# Patient Record
Sex: Female | Born: 1935
Health system: Southern US, Community
[De-identification: ages and names within clinical notes are randomized; demographics above are authoritative.]

## PROBLEM LIST (undated history)

## (undated) DIAGNOSIS — G43909 Migraine, unspecified, not intractable, without status migrainosus: Secondary | ICD-10-CM

## (undated) DIAGNOSIS — Z9289 Personal history of other medical treatment: Secondary | ICD-10-CM

## (undated) DIAGNOSIS — R51 Headache: Secondary | ICD-10-CM

## (undated) DIAGNOSIS — Z5189 Encounter for other specified aftercare: Secondary | ICD-10-CM

## (undated) DIAGNOSIS — D649 Anemia, unspecified: Secondary | ICD-10-CM

## (undated) DIAGNOSIS — K52831 Collagenous colitis: Secondary | ICD-10-CM

## (undated) DIAGNOSIS — I6529 Occlusion and stenosis of unspecified carotid artery: Secondary | ICD-10-CM

## (undated) DIAGNOSIS — Z9889 Other specified postprocedural states: Secondary | ICD-10-CM

## (undated) DIAGNOSIS — R519 Headache, unspecified: Secondary | ICD-10-CM

## (undated) DIAGNOSIS — G8929 Other chronic pain: Secondary | ICD-10-CM

## (undated) DIAGNOSIS — K648 Other hemorrhoids: Secondary | ICD-10-CM

## (undated) DIAGNOSIS — I10 Essential (primary) hypertension: Secondary | ICD-10-CM

## (undated) DIAGNOSIS — Z86018 Personal history of other benign neoplasm: Secondary | ICD-10-CM

## (undated) DIAGNOSIS — N390 Urinary tract infection, site not specified: Secondary | ICD-10-CM

## (undated) DIAGNOSIS — Z8739 Personal history of other diseases of the musculoskeletal system and connective tissue: Secondary | ICD-10-CM

## (undated) DIAGNOSIS — E039 Hypothyroidism, unspecified: Secondary | ICD-10-CM

## (undated) DIAGNOSIS — K449 Diaphragmatic hernia without obstruction or gangrene: Secondary | ICD-10-CM

## (undated) DIAGNOSIS — E785 Hyperlipidemia, unspecified: Secondary | ICD-10-CM

## (undated) DIAGNOSIS — K5790 Diverticulosis of intestine, part unspecified, without perforation or abscess without bleeding: Secondary | ICD-10-CM

## (undated) DIAGNOSIS — M1990 Unspecified osteoarthritis, unspecified site: Secondary | ICD-10-CM

## (undated) HISTORY — DX: Hyperlipidemia, unspecified: E78.5

## (undated) HISTORY — DX: Encounter for other specified aftercare: Z51.89

## (undated) HISTORY — DX: Hypothyroidism, unspecified: E03.9

## (undated) HISTORY — DX: Other specified postprocedural states: Z98.890

## (undated) HISTORY — DX: Personal history of other medical treatment: Z92.89

## (undated) HISTORY — DX: Other hemorrhoids: K64.8

## (undated) HISTORY — DX: Headache, unspecified: R51.9

## (undated) HISTORY — DX: Migraine, unspecified, not intractable, without status migrainosus: G43.909

## (undated) HISTORY — PX: FUNCTIONAL ENDOSCOPIC SINUS SURGERY: SUR616

## (undated) HISTORY — PX: POLYPECTOMY: SHX149

## (undated) HISTORY — PX: TONSILLECTOMY: SUR1361

## (undated) HISTORY — DX: Other chronic pain: G89.29

## (undated) HISTORY — DX: Diverticulosis of intestine, part unspecified, without perforation or abscess without bleeding: K57.90

## (undated) HISTORY — DX: Headache: R51

## (undated) HISTORY — PX: BREAST BIOPSY: SHX20

## (undated) HISTORY — DX: Unspecified osteoarthritis, unspecified site: M19.90

## (undated) HISTORY — DX: Occlusion and stenosis of unspecified carotid artery: I65.29

## (undated) HISTORY — PX: BUNIONECTOMY: SHX129

## (undated) HISTORY — DX: Essential (primary) hypertension: I10

## (undated) HISTORY — DX: Diaphragmatic hernia without obstruction or gangrene: K44.9

## (undated) HISTORY — DX: Urinary tract infection, site not specified: N39.0

## (undated) HISTORY — DX: Collagenous colitis: K52.831

## (undated) HISTORY — PX: HAMMER TOE SURGERY: SHX385

---

## 1996-05-04 HISTORY — PX: HAND SURGERY: SHX662

## 2006-05-04 HISTORY — PX: TOTAL HIP ARTHROPLASTY: SHX124

## 2007-05-05 DIAGNOSIS — K648 Other hemorrhoids: Secondary | ICD-10-CM

## 2007-05-05 HISTORY — DX: Other hemorrhoids: K64.8

## 2008-03-04 HISTORY — PX: LAMINECTOMY: SHX219

## 2009-10-25 ENCOUNTER — Encounter (HOSPITAL_COMMUNITY): Admission: RE | Admit: 2009-10-25 | Discharge: 2009-11-24 | Payer: Self-pay | Admitting: Orthopedic Surgery

## 2010-03-04 HISTORY — PX: CATARACT EXTRACTION W/ INTRAOCULAR LENS IMPLANT: SHX1309

## 2010-07-09 ENCOUNTER — Other Ambulatory Visit: Payer: Self-pay | Admitting: Obstetrics and Gynecology

## 2010-07-09 DIAGNOSIS — Z1231 Encounter for screening mammogram for malignant neoplasm of breast: Secondary | ICD-10-CM

## 2010-07-31 ENCOUNTER — Ambulatory Visit
Admission: RE | Admit: 2010-07-31 | Discharge: 2010-07-31 | Disposition: A | Discharge status: Home / Self Care | Payer: BC Managed Care – PPO | Source: Ambulatory Visit | Attending: Obstetrics and Gynecology | Admitting: Obstetrics and Gynecology

## 2010-07-31 DIAGNOSIS — Z1231 Encounter for screening mammogram for malignant neoplasm of breast: Secondary | ICD-10-CM

## 2010-11-02 HISTORY — PX: CATARACT EXTRACTION W/ INTRAOCULAR LENS IMPLANT: SHX1309

## 2011-04-13 ENCOUNTER — Encounter: Payer: Self-pay | Admitting: Gastroenterology

## 2011-05-05 DIAGNOSIS — K5790 Diverticulosis of intestine, part unspecified, without perforation or abscess without bleeding: Secondary | ICD-10-CM

## 2011-05-05 HISTORY — PX: COLONOSCOPY: SHX174

## 2011-05-05 HISTORY — DX: Diverticulosis of intestine, part unspecified, without perforation or abscess without bleeding: K57.90

## 2011-05-08 ENCOUNTER — Ambulatory Visit (INDEPENDENT_AMBULATORY_CARE_PROVIDER_SITE_OTHER): Payer: Medicare Other | Admitting: Gastroenterology

## 2011-05-08 ENCOUNTER — Other Ambulatory Visit (INDEPENDENT_AMBULATORY_CARE_PROVIDER_SITE_OTHER): Payer: Medicare Other

## 2011-05-08 ENCOUNTER — Encounter: Payer: Self-pay | Admitting: Gastroenterology

## 2011-05-08 VITALS — BP 148/78 | HR 76 | Ht 63.0 in | Wt 129.0 lb

## 2011-05-08 DIAGNOSIS — Z860100 Personal history of colon polyps, unspecified: Secondary | ICD-10-CM | POA: Insufficient documentation

## 2011-05-08 DIAGNOSIS — R6889 Other general symptoms and signs: Secondary | ICD-10-CM

## 2011-05-08 DIAGNOSIS — D509 Iron deficiency anemia, unspecified: Secondary | ICD-10-CM | POA: Insufficient documentation

## 2011-05-08 DIAGNOSIS — Z800 Family history of malignant neoplasm of digestive organs: Secondary | ICD-10-CM

## 2011-05-08 DIAGNOSIS — Z8711 Personal history of peptic ulcer disease: Secondary | ICD-10-CM

## 2011-05-08 DIAGNOSIS — K5730 Diverticulosis of large intestine without perforation or abscess without bleeding: Secondary | ICD-10-CM | POA: Insufficient documentation

## 2011-05-08 DIAGNOSIS — R197 Diarrhea, unspecified: Secondary | ICD-10-CM | POA: Diagnosis not present

## 2011-05-08 LAB — VITAMIN B12: Vitamin B-12: 751 pg/mL (ref 211–911)

## 2011-05-08 LAB — HEPATIC FUNCTION PANEL
ALT: 17 U/L (ref 0–35)
Albumin: 4.1 g/dL (ref 3.5–5.2)
Alkaline Phosphatase: 45 U/L (ref 39–117)
Total Protein: 7.8 g/dL (ref 6.0–8.3)

## 2011-05-08 LAB — BASIC METABOLIC PANEL
BUN: 19 mg/dL (ref 6–23)
CO2: 26 mEq/L (ref 19–32)
Calcium: 9.1 mg/dL (ref 8.4–10.5)
GFR: 63.59 mL/min (ref >60.00)
Glucose, Bld: 93 mg/dL (ref 70–99)
Potassium: 4.6 mEq/L (ref 3.5–5.1)

## 2011-05-08 LAB — CBC WITH DIFFERENTIAL/PLATELET
Basophils Absolute: 0 10*3/uL (ref 0.0–0.1)
Eosinophils Absolute: 0.2 10*3/uL (ref 0.0–0.7)
Hemoglobin: 14.2 g/dL (ref 12.0–15.0)
Lymphocytes Relative: 21.2 % (ref 12.0–46.0)
Monocytes Relative: 4.2 % (ref 3.0–12.0)
Neutro Abs: 5.1 10*3/uL (ref 1.4–7.7)
Neutrophils Relative %: 71.2 % (ref 43.0–77.0)
Platelets: 328 10*3/uL (ref 150.0–400.0)
RDW: 14 % (ref 11.5–14.6)

## 2011-05-08 LAB — FERRITIN: Ferritin: 20.5 ng/mL (ref 10.0–291.0)

## 2011-05-08 LAB — FOLATE: Folate: >24.8 ng/mL (ref >5.9)

## 2011-05-08 LAB — IBC PANEL: Saturation Ratios: 18.7 % — ABNORMAL LOW (ref 20.0–50.0)

## 2011-05-08 MED ORDER — PEG-KCL-NACL-NASULF-NA ASC-C 100 G PO SOLR: 1.0000 | Freq: Once | ORAL | Status: DC

## 2011-05-08 NOTE — Progress Notes (Signed)
History of Present Illness:  This is a very pleasant 78 year old Caucasian female recently moved to this area from [place]. She has a large amount of records which I reviewed today. She has had chronic diarrhea for 10 years with urgency and increased gas and bloating, but no real pain, rectal bleeding, anorexia or weight loss. She has had several colonoscopies and has diverticulosis, a history of internal hemorrhoids, and polyps removed several years ago. Most recent colonoscopy in 2009 and showed no colon polyps. It is of note, she had a brother die of colon cancer at age 23.  She denies any specific food intolerances except to beans and roughage. She denies any upper gastrointestinal or hepatobiliary or systemic complaints such as fever or chills. She has not had previous abdominal surgery. In the past the patient did have peptic ulcer disease, was treated for H. pylori, and subsequently had an NSAID-induced O.'s duration with her last endoscopy in 2004. Had a previous hip replacement but denies NSAID use at this time. She does not smoke or abuse ethanol. There is no history of pancreatitis, hepatitis, or other liver disease.  I have reviewed this patient's present history, medical and surgical past history, allergies and medications.     ROS: The remainder of the 10 point ROS is negative.. she describes nonspecific chronic low back pain. She has had previous laminectomy. No current cardiovascular or pulmonary complaints or upper GI issues.   Physical Exam: General well developed well nourished patient in no acute distress, appearing her stated age Eyes PERRLA, no icterus, fundoscopic exam per opthamologist Skin no lesions noted Neck supple, no adenopathy, no thyroid enlargement, no tenderness Chest clear to percussion and auscultation Heart no significant murmurs, gallops or rubs noted Abdomen no hepatosplenomegaly masses or tenderness, BS normal.  Extremities no acute joint lesions, edema,  phlebitis or evidence of cellulitis. Neurologic patient oriented x 3, cranial nerves intact, no focal neurologic deficits noted. Psychological mental status normal and normal affect.  Assessment and plan: Her symptomatology seems him as classic for microscopic-lymphocytic-collagenous colitis. I reviewed her numerous breath test that have not shown any direct evidence of bacterial overgrowth. Previous duodenal biopsy showed no evidence of celiac disease. We will repeat her colonoscopy with random biopsies, and screening lab test ordered today for review. If her colonoscopy and colon biopsies are negative, I will consider treatment with Xifaxan and Florstar. She otherwise is to continue her other medications as listed and reviewed. Although there is a family history of colon cancer in a young first degree relative, it does not sound like she has a hereditary polyposis syndrome. Encounter Diagnoses  Name Primary?  . Diarrhea Yes  . Iron deficiency anemia, unspecified    . Other general symptoms    . Family history of malignant neoplasm of gastrointestinal tract   . Personal history of colonic polyps   . History of peptic ulcer disease   . Diverticulosis of colon (without mention of hemorrhage)

## 2011-05-08 NOTE — Patient Instructions (Signed)
Your procedure has been scheduled for 06/09/2011, please follow the seperate instructions.  Handout on propofol given. Please go to the basement today for your labs.  Your prescription(s) have been sent to you pharmacy.

## 2011-05-11 ENCOUNTER — Other Ambulatory Visit: Payer: Medicare Other

## 2011-05-11 DIAGNOSIS — R197 Diarrhea, unspecified: Secondary | ICD-10-CM

## 2011-05-11 LAB — CELIAC PANEL 10
Endomysial Screen: NEGATIVE
Gliadin IgA: 1.8 U/mL (ref <20)
Gliadin IgG: 11.6 U/mL (ref <20)
Tissue Transglut Ab: 8.3 U/mL (ref <20)
Tissue Transglutaminase Ab, IgA: 2.5 U/mL (ref <20)

## 2011-05-21 DIAGNOSIS — Z961 Presence of intraocular lens: Secondary | ICD-10-CM | POA: Diagnosis not present

## 2011-05-21 DIAGNOSIS — H401490 Capsular glaucoma with pseudoexfoliation of lens, unspecified eye, stage unspecified: Secondary | ICD-10-CM | POA: Diagnosis not present

## 2011-06-09 ENCOUNTER — Ambulatory Visit (AMBULATORY_SURGERY_CENTER): Payer: Medicare Other | Admitting: Gastroenterology

## 2011-06-09 ENCOUNTER — Encounter: Payer: Self-pay | Admitting: Gastroenterology

## 2011-06-09 VITALS — BP 164/95 | HR 83 | Temp 96.5°F | Resp 22 | Ht 63.0 in | Wt 129.0 lb

## 2011-06-09 DIAGNOSIS — K5289 Other specified noninfective gastroenteritis and colitis: Secondary | ICD-10-CM | POA: Diagnosis not present

## 2011-06-09 DIAGNOSIS — Z800 Family history of malignant neoplasm of digestive organs: Secondary | ICD-10-CM

## 2011-06-09 DIAGNOSIS — I10 Essential (primary) hypertension: Secondary | ICD-10-CM | POA: Diagnosis not present

## 2011-06-09 DIAGNOSIS — R197 Diarrhea, unspecified: Secondary | ICD-10-CM

## 2011-06-09 DIAGNOSIS — D649 Anemia, unspecified: Secondary | ICD-10-CM | POA: Diagnosis not present

## 2011-06-09 DIAGNOSIS — K529 Noninfective gastroenteritis and colitis, unspecified: Secondary | ICD-10-CM

## 2011-06-09 DIAGNOSIS — E785 Hyperlipidemia, unspecified: Secondary | ICD-10-CM | POA: Diagnosis not present

## 2011-06-09 DIAGNOSIS — I739 Peripheral vascular disease, unspecified: Secondary | ICD-10-CM | POA: Diagnosis not present

## 2011-06-09 DIAGNOSIS — H409 Unspecified glaucoma: Secondary | ICD-10-CM | POA: Diagnosis not present

## 2011-06-09 DIAGNOSIS — K5730 Diverticulosis of large intestine without perforation or abscess without bleeding: Secondary | ICD-10-CM

## 2011-06-09 MED ORDER — SODIUM CHLORIDE 0.9 % IV SOLN: 500.0000 mL | INTRAVENOUS | Status: DC

## 2011-06-09 NOTE — Patient Instructions (Signed)
Please read the handouts given to you by your recovery room nurse.    Your biopsy results will be mailed to your home within two weeks.  You need to increase the fiber in your diet due to your diverticulosis.   You may resume your routine medications today.  If you have any questions, please call us at [phone].  Thank-you.

## 2011-06-09 NOTE — Progress Notes (Signed)
Patient did not have preoperative order for IV antibiotic SSI prophylaxis. (G8918)  Patient did not experience any of the following events: a burn prior to discharge; a fall within the facility; wrong site/side/patient/procedure/implant event; or a hospital transfer or hospital admission upon discharge from the facility. (G8907)  

## 2011-06-09 NOTE — Op Note (Signed)
[facility] [address] [place], [place]  [address]  COLONOSCOPY PROCEDURE REPORT  PATIENT:  Carol Wells, Carol Wells  MR#:  [id number] BIRTHDATE:  07-12-1931, 79 yrs. old  GENDER:  female ENDOSCOPIST:  Vania Rea. Jarold Motto, MD, [facility] REF. BY: PROCEDURE DATE:  06/09/2011 PROCEDURE:  Colonoscopy with biopsy ASA CLASS:  Class III INDICATIONS:  unexplained diarrhea, surveillance MEDICATIONS:   propofol (Diprivan) 200 mg IV  DESCRIPTION OF PROCEDURE:   After the risks and benefits and of the procedure were explained, informed consent was obtained. Digital rectal exam was performed and revealed no abnormalities. The LB [id number] endoscope was introduced through the anus and advanced to the cecum, which was identified by both the appendix and ileocecal valve.  The quality of the prep was good, using MoviPrep.  The instrument was then slowly withdrawn as the colon was fully examined. <<PROCEDUREIMAGES>>  FINDINGS:  There were mild diverticular changes in left colon. diverticulosis was found.  No polyps or cancers were seen.  This was otherwise a normal examination of the colon. RANDOM BIOPSIES DONE.   Retroflexed views in the rectum revealed no abnormalities. The scope was then withdrawn from the patient and the procedure completed.  COMPLICATIONS:  None ENDOSCOPIC IMPRESSION: 1) Diverticulosis,mild,left sided diverticulosis 2) No polyps or cancers 3) Otherwise normal examination R/O MICROSCOPIC/COLLAGENOUS COLITIS. RECOMMENDATIONS: 1) Await biopsy results 2) Continue current medications  REPEAT EXAM:  No  ______________________________ Vania Rea. Jarold Motto, MD, [facility]  CC:  Dwana Melena MD  n. Rosalie DoctorVania Rea. Marieta Markov at 06/09/2011 09:57 AM  [place], [id number]

## 2011-06-10 ENCOUNTER — Telehealth: Payer: Self-pay

## 2011-06-10 NOTE — Telephone Encounter (Signed)
  Follow up Call-  Call back number 06/09/2011  Post procedure Call Back phone  # [phone]  Permission to leave phone message Yes     Patient questions:  Do you have a fever, pain , or abdominal swelling? no Pain Score  0 *  Have you tolerated food without any problems? yes  Have you been able to return to your normal activities? yes  Do you have any questions about your discharge instructions: Diet   no Medications  no Follow up visit  no  Do you have questions or concerns about your Care? no  Actions: * If pain score is 4 or above: No action needed, pain <4.

## 2011-06-11 NOTE — Progress Notes (Signed)
Information that was entered and then removed from 06/09/2011 @ 0827 was removed by me because documentation was placed in error by Darlyn Read, RN and belonged to another patient.  Both charts reviewed and corrected.

## 2011-06-12 ENCOUNTER — Telehealth: Payer: Self-pay | Admitting: Gastroenterology

## 2011-06-12 NOTE — Telephone Encounter (Signed)
Pt requested a copy of her labs; mailed to her.

## 2011-06-17 ENCOUNTER — Encounter: Payer: Self-pay | Admitting: Gastroenterology

## 2011-06-18 ENCOUNTER — Telehealth: Payer: Self-pay | Admitting: *Deleted

## 2011-06-18 MED ORDER — BUDESONIDE 3 MG PO CP24: ORAL_CAPSULE | ORAL | Status: DC

## 2011-06-18 NOTE — Telephone Encounter (Signed)
Informed pt of her dx and need for med therapy. She states Dr Jarold Motto informed her this may be her problem. Mailed her the path letter and appt card and will order her entocort; pt stated understanding.

## 2011-06-18 NOTE — Telephone Encounter (Signed)
  lmom for pt to call back. We need to order Entocort for her d/t Collagenous  Colitis. Per DR Jarold Motto: ENTOCORT 3MG ,3 PO QAM,SEE ME 1 MONTH. Scheduled pt for f/u on 07/16/11 at 1030am.

## 2011-06-19 ENCOUNTER — Telehealth: Payer: Self-pay | Admitting: *Deleted

## 2011-06-19 NOTE — Telephone Encounter (Signed)
Pt called to report the Entocort will cost $1300 and insurance won't pay any on it. Please advise on alternative med if any. Thanks.

## 2011-06-22 ENCOUNTER — Encounter: Payer: Self-pay | Admitting: Gastroenterology

## 2011-06-22 NOTE — Telephone Encounter (Signed)
Error

## 2011-06-22 NOTE — Telephone Encounter (Signed)
Called [facility] and the co pay was $5; pt stated she picked up the med.

## 2011-06-22 NOTE — Telephone Encounter (Signed)
generic

## 2011-07-13 ENCOUNTER — Encounter: Payer: Self-pay | Admitting: *Deleted

## 2011-07-16 ENCOUNTER — Encounter: Payer: Self-pay | Admitting: Gastroenterology

## 2011-07-16 ENCOUNTER — Ambulatory Visit (INDEPENDENT_AMBULATORY_CARE_PROVIDER_SITE_OTHER): Payer: Medicare Other | Admitting: Gastroenterology

## 2011-07-16 VITALS — BP 120/80 | HR 76 | Ht 63.0 in | Wt 130.4 lb

## 2011-07-16 DIAGNOSIS — K52831 Collagenous colitis: Secondary | ICD-10-CM | POA: Insufficient documentation

## 2011-07-16 DIAGNOSIS — K5289 Other specified noninfective gastroenteritis and colitis: Secondary | ICD-10-CM

## 2011-07-16 MED ORDER — BUDESONIDE 3 MG PO CP24: ORAL_CAPSULE | ORAL | Status: DC

## 2011-07-16 NOTE — Patient Instructions (Signed)
Decrease your Entocort to two tablets by mouth once a day for one month, then one tablet by mouth once a day for a month.  Make an office to follow up with Dr Jarold Motto in 2 months.  Your prescription(s) have been sent to you pharmacy.     Collagenous Colitis, Lymphocytic Colitis Inflammatory bowel disease is a general name for diseases that cause inflammation in the intestines. Collagenous colitis and lymphocytic colitis are two types of bowel inflammation that affect the large intestine (colon).  They are not related to more severe forms of inflammatory bowel disease (Crohn's disease or ulcerative colitis). CAUSES  No precise cause has been found for collagenous colitis or lymphocytic colitis. Possible causes include:  Bacteria and their toxins.   Viruses.   Nonsteroidal anti-inflammatory drugs (NSAIDs).  Some researchers have suggested that collagenous colitis and lymphocytic colitis result from an autoimmune response. This means that the body's immune system destroys cells for no known reason. SYMPTOMS   The symptoms are similar for both; chronic watery, non-bloody diarrhea.   The diarrhea may be continuous or episodic. Abdominal pain or cramps may also be present.  DIAGNOSIS  The diagnosis of collagenous colitis or lymphocytic colitis is made after tissue samples are taken during colonoscopy or flexible sigmoidoscopy. These are tests that use tools to look inside your colon without having to operate. Samples taken from your colon are examined under a microscope.   Collagenous colitis is characterized by collagen deposits inside the lining of the colon. Multiple tissue samples from different areas of the colon may need to be examined. People with collagenous colitis are most often diagnosed in their 46's, although some cases have been reported in adults younger than 45 years and in children aged 5 to 67. It is diagnosed more frequently in women than men.   In lymphocytic colitis,  tissue samples show inflammation with white blood cells, known as lymphocytes, between the cells that line the colon. In contrast to collagenous colitis, there is no abnormality of the collagen.  TREATMENT  Treatment depends on the symptoms and severity of the cases. The diseases have been known to resolve by themselves, but most patients have recurrent symptoms.  Lifestyle changes aimed at improving diarrhea are usually tried first. They include:   Reducing the amount of fat in the diet.   Eliminating foods that contain caffeine or lactose.   Not using NSAIDs.   If lifestyle changes are not enough, medicines are often used to control the symptoms. They include:   Anti-diarrheal medicines.   Anti-inflammatory medicines.   Immunosuppressive agents, which reduce the autoimmune response. This medicine is rarely needed.   For very extreme cases, bypass of the colon or surgery to remove all or part of the colon has been done in a few patients. This is rare.   Collagenous colitis and lymphocytic colitis do not increase the risk of colon cancer.  FOR MORE INFORMATION  Crohn's & Colitis [employer] of [employer].: PooledIncome[employer]pl [employer] of Diabetes and Digestive and Kidney Diseases: CheatPrevention.com.au Document Released: 03/20/2004 Document Revised: 04/09/2011 Document Reviewed: 08/24/2007 [facility] Patient Information 2012 [place], [place].

## 2011-07-16 NOTE — Progress Notes (Signed)
History of Present Illness: This is a extremely nice 78 year old Caucasian female with chronic diarrhea and recently diagnosed collagenous colitis per colonoscopy and colon biopsies. She currently is asymptomatic all Entocort EC 9 mg a day for the last month. She's had no side effects of this medication, has one bowel movement a day, and denies systemic or other gastrointestinal complaints. She continues to deny any exposure to NSAIDs . Previous evaluation for celiac disease was negative.    Current Medications, Allergies, Past Medical History, Past Surgical History, Family History and Social History were reviewed in [employer] record.   Assessment and plan: Microscopic/collagenous colitis with good response to nonabsorbable oral steroid preparation. I have decreased her Entocort to 6 mg a day for one month, then as tolerated 3 mg a day for one month, with office followup at that time. Hopefully she will be able to come off of Entocort with when necessary usage in the future. Patient has no specific food intolerances area and encourage her to take her other medications as listed and reviewed per primary care.  Please copy her primary care physician, referring physician, and pertinent subspecialists. No diagnosis found.

## 2011-08-31 ENCOUNTER — Other Ambulatory Visit: Payer: Self-pay | Admitting: Obstetrics and Gynecology

## 2011-08-31 DIAGNOSIS — Z1231 Encounter for screening mammogram for malignant neoplasm of breast: Secondary | ICD-10-CM

## 2011-08-31 DIAGNOSIS — M8580 Other specified disorders of bone density and structure, unspecified site: Secondary | ICD-10-CM

## 2011-09-11 ENCOUNTER — Ambulatory Visit
Admission: RE | Admit: 2011-09-11 | Discharge: 2011-09-11 | Disposition: A | Discharge status: Home / Self Care | Payer: Medicare Other | Source: Ambulatory Visit | Attending: Obstetrics and Gynecology | Admitting: Obstetrics and Gynecology

## 2011-09-11 DIAGNOSIS — Z1231 Encounter for screening mammogram for malignant neoplasm of breast: Secondary | ICD-10-CM | POA: Diagnosis not present

## 2011-09-11 DIAGNOSIS — M8580 Other specified disorders of bone density and structure, unspecified site: Secondary | ICD-10-CM

## 2011-09-17 ENCOUNTER — Ambulatory Visit (INDEPENDENT_AMBULATORY_CARE_PROVIDER_SITE_OTHER): Payer: Medicare Other | Admitting: Gastroenterology

## 2011-09-17 ENCOUNTER — Encounter: Payer: Self-pay | Admitting: Gastroenterology

## 2011-09-17 VITALS — BP 112/70 | HR 76 | Ht 63.0 in | Wt 132.1 lb

## 2011-09-17 DIAGNOSIS — K5289 Other specified noninfective gastroenteritis and colitis: Secondary | ICD-10-CM

## 2011-09-17 DIAGNOSIS — K52832 Lymphocytic colitis: Secondary | ICD-10-CM

## 2011-09-17 NOTE — Progress Notes (Signed)
History of Present Illness: This is a very nice 78 year old Caucasian female with biopsy-proven lymphocytic colitis. She has tapered her Entocort over the last several months to 3 mg a day, and currently denies any diarrhea or GI complaints. She does have some continued gas with beans and other high roughage foods. There no upper gastrointestinal or hepatobiliary complaints. Her appetite is good and her weight is stable.    Current Medications, Allergies, Past Medical History, Past Surgical History, Family History and Social History were reviewed in [employer] record.   Assessment and plan: Lymphocytic colitis with good response to Entocort therapy. We will stop her medications today and we will see how she does symptomatically. She may need intermittent courses of Entocort therapy. Previous small bowel biopsy showed no evidence of celiac disease, and as per my previous notes, she has had multiple GI evaluations in [place] over the last 10 years. She does have a family history of colon cancer, but colonoscopy is otherwise unremarkable. No diagnosis found.

## 2011-09-17 NOTE — Patient Instructions (Signed)
Stop Entocort Call back as needed.

## 2011-09-22 DIAGNOSIS — Z01419 Encounter for gynecological examination (general) (routine) without abnormal findings: Secondary | ICD-10-CM | POA: Diagnosis not present

## 2011-09-22 DIAGNOSIS — R350 Frequency of micturition: Secondary | ICD-10-CM | POA: Diagnosis not present

## 2011-09-22 DIAGNOSIS — Z124 Encounter for screening for malignant neoplasm of cervix: Secondary | ICD-10-CM | POA: Diagnosis not present

## 2011-09-23 ENCOUNTER — Telehealth: Payer: Self-pay | Admitting: Gastroenterology

## 2011-09-23 ENCOUNTER — Other Ambulatory Visit: Payer: Self-pay | Admitting: Obstetrics and Gynecology

## 2011-09-23 NOTE — Telephone Encounter (Signed)
OK,SEE ME 1 MONTH

## 2011-09-23 NOTE — Telephone Encounter (Signed)
Pt saw you on 09/17/11 and Entocort was stopped. Pt reports over the weekend the stools increased and evolved into diarrhea. This am she took 3 Entocort as directed.  Please advise. Thanks.

## 2011-09-23 NOTE — Telephone Encounter (Signed)
Dr Jarold Motto, does pt taper her dose over the next month or remain on 3 tabs of Entocort daily? Thanks.

## 2011-09-24 MED ORDER — BUDESONIDE 3 MG PO CP24: ORAL_CAPSULE | ORAL | Status: DC

## 2011-09-24 NOTE — Telephone Encounter (Signed)
lmom for pt to call back. Ordered a refill on Endocort.

## 2011-09-24 NOTE — Telephone Encounter (Signed)
9 mg day as tolerated

## 2011-09-24 NOTE — Telephone Encounter (Signed)
Notified pt Dr Jarold Motto instructed her to use 9mg  Entocort as tolerated; pt stated she took 1 or 3mg  today. She will f/u with Dr Jarold Motto on 10/29/11.

## 2011-09-29 DIAGNOSIS — E785 Hyperlipidemia, unspecified: Secondary | ICD-10-CM | POA: Diagnosis not present

## 2011-09-29 DIAGNOSIS — I10 Essential (primary) hypertension: Secondary | ICD-10-CM | POA: Diagnosis not present

## 2011-10-15 ENCOUNTER — Ambulatory Visit
Admission: RE | Admit: 2011-10-15 | Discharge: 2011-10-15 | Disposition: A | Discharge status: Home / Self Care | Payer: Medicare Other | Source: Ambulatory Visit | Attending: Obstetrics and Gynecology | Admitting: Obstetrics and Gynecology

## 2011-10-15 DIAGNOSIS — Z780 Asymptomatic menopausal state: Secondary | ICD-10-CM | POA: Diagnosis not present

## 2011-10-15 DIAGNOSIS — Z13820 Encounter for screening for osteoporosis: Secondary | ICD-10-CM | POA: Diagnosis not present

## 2011-10-29 ENCOUNTER — Encounter: Payer: Self-pay | Admitting: Gastroenterology

## 2011-10-29 ENCOUNTER — Ambulatory Visit (INDEPENDENT_AMBULATORY_CARE_PROVIDER_SITE_OTHER): Payer: Medicare Other | Admitting: Gastroenterology

## 2011-10-29 VITALS — BP 150/80 | HR 80 | Ht 63.0 in | Wt 132.0 lb

## 2011-10-29 DIAGNOSIS — D802 Selective deficiency of immunoglobulin A [IgA]: Secondary | ICD-10-CM | POA: Diagnosis not present

## 2011-10-29 DIAGNOSIS — M1990 Unspecified osteoarthritis, unspecified site: Secondary | ICD-10-CM

## 2011-10-29 DIAGNOSIS — K5289 Other specified noninfective gastroenteritis and colitis: Secondary | ICD-10-CM

## 2011-10-29 DIAGNOSIS — K52831 Collagenous colitis: Secondary | ICD-10-CM

## 2011-10-29 MED ORDER — MESALAMINE 1.2 G PO TBEC: 2.4000 g | DELAYED_RELEASE_TABLET | Freq: Every day | ORAL | Status: DC

## 2011-10-29 NOTE — Patient Instructions (Addendum)
We have sent the following medications to your pharmacy for you to pick up at your convenience: Lialda, 2 tablets daily. Continue your Entocort at 2 tablets daily x 2 more weeks, then decrease to 1 tablet daily, then discontinue. Dr Jarold Motto would like to see you back in follow up in 2 months. We have given you a copy of your Celiac Panel results CC:Dr [address]

## 2011-10-29 NOTE — Progress Notes (Signed)
History of Present Illness: This is a very nice 78 year old Caucasian female who has had watery diarrhea for over 10 years. Recent sigmoidoscopy with biopsies confirm collagenous colitis. She began Entocort therapy in February, and was able to taper off of this medication over 2 months, but had a rapid relapse of her watery diarrhea, and is currently asymptomatic on 6 mg a day of Entocort. She denies other gastrointestinal symptomatology, anorexia or weight loss. She uses when necessary aspirin and Celebrex for back pain. She apparently had recent bone density study performed. She does take estrogen replacement therapy calcium and vitamin D and vitamin C.    Current Medications, Allergies, Past Medical History, Past Surgical History, Family History and Social History were reviewed in [employer] record.   Assessment and plan: Collagenous colitis very responsive to Entocort therapy. However, I am concerned about long-term side effects of any steroid preparation, and I have decided to place her on Lialda 2.4 g a day as that topical aminosalicylate. She is to taper her Entocort to 2 tablets a day for 2 weeks, then one tablet a day for 2 weeks and then discontinue this medication but continue Lialda. I will see her back in 2 months time for followup. She is to call she has paradoxical worsening of her diarrhea with the amino salicylates. Also again a vascular to avoid Cox 1 NSAIDs if possible. Review of her labs shows a relative IgA deficiency, but celiac serologies otherwise were negative. I've given her copy of her labs, and also patient education material from Up-To-Date concerning microscopic and collagenous colitis. She is to continue other medications as listed and reviewed today.  Please copy her primary care physician, referring physician, and pertinent subspecialists. No diagnosis found.

## 2011-11-03 DIAGNOSIS — M1710 Unilateral primary osteoarthritis, unspecified knee: Secondary | ICD-10-CM | POA: Diagnosis not present

## 2011-11-03 DIAGNOSIS — M25559 Pain in unspecified hip: Secondary | ICD-10-CM | POA: Diagnosis not present

## 2011-11-11 DIAGNOSIS — D259 Leiomyoma of uterus, unspecified: Secondary | ICD-10-CM | POA: Diagnosis not present

## 2011-11-11 DIAGNOSIS — N852 Hypertrophy of uterus: Secondary | ICD-10-CM | POA: Diagnosis not present

## 2011-11-11 DIAGNOSIS — N950 Postmenopausal bleeding: Secondary | ICD-10-CM | POA: Diagnosis not present

## 2011-11-19 DIAGNOSIS — H2640 Unspecified secondary cataract: Secondary | ICD-10-CM | POA: Diagnosis not present

## 2011-11-19 DIAGNOSIS — Z961 Presence of intraocular lens: Secondary | ICD-10-CM | POA: Diagnosis not present

## 2011-11-19 DIAGNOSIS — H401490 Capsular glaucoma with pseudoexfoliation of lens, unspecified eye, stage unspecified: Secondary | ICD-10-CM | POA: Diagnosis not present

## 2011-12-08 ENCOUNTER — Telehealth: Payer: Self-pay | Admitting: *Deleted

## 2011-12-08 MED ORDER — MESALAMINE 1.2 G PO TBEC: 2.4000 g | DELAYED_RELEASE_TABLET | Freq: Every day | ORAL | Status: DC

## 2011-12-08 NOTE — Telephone Encounter (Signed)
New rx sent

## 2011-12-09 ENCOUNTER — Telehealth: Payer: Self-pay | Admitting: Gastroenterology

## 2011-12-09 ENCOUNTER — Other Ambulatory Visit: Payer: Self-pay | Admitting: Gastroenterology

## 2011-12-09 MED ORDER — MESALAMINE 1.2 G PO TBEC: 2.4000 g | DELAYED_RELEASE_TABLET | Freq: Every day | ORAL | Status: DC

## 2011-12-09 NOTE — Progress Notes (Signed)
Patient states that she has actually decided to get 90 day script of Lialda with her mail in pharmacy. I have sent the rx and have discontinued the rx with [facility].

## 2011-12-29 ENCOUNTER — Ambulatory Visit: Payer: Medicare Other | Admitting: Gastroenterology

## 2011-12-30 ENCOUNTER — Encounter: Payer: Self-pay | Admitting: *Deleted

## 2012-01-01 ENCOUNTER — Telehealth: Payer: Self-pay | Admitting: *Deleted

## 2012-01-01 ENCOUNTER — Encounter: Payer: Self-pay | Admitting: Gastroenterology

## 2012-01-01 ENCOUNTER — Ambulatory Visit (INDEPENDENT_AMBULATORY_CARE_PROVIDER_SITE_OTHER): Payer: Medicare Other | Admitting: Gastroenterology

## 2012-01-01 VITALS — BP 126/76 | HR 72 | Ht 61.75 in | Wt 130.0 lb

## 2012-01-01 DIAGNOSIS — K52831 Collagenous colitis: Secondary | ICD-10-CM

## 2012-01-01 DIAGNOSIS — E342 Ectopic hormone secretion, not elsewhere classified: Secondary | ICD-10-CM

## 2012-01-01 DIAGNOSIS — K5289 Other specified noninfective gastroenteritis and colitis: Secondary | ICD-10-CM

## 2012-01-01 MED ORDER — BUDESONIDE 3 MG PO CP24: 3.0000 mg | ORAL_CAPSULE | ORAL | Status: DC

## 2012-01-01 NOTE — Progress Notes (Signed)
History of Present Illness: This is a 78 year old Caucasian female with chronic watery diarrhea for many years recently diagnosed as having collagenous colitis. She had an excellent response to Entocort therapy, was able to taper to 3 mg a day, and we attempted to switch her to by mouth aminosalicylates without success. She tried Lialda 2.4 g a day for several weeks and her diarrhea has persisted and relapsed.    Current Medications, Allergies, Past Medical History, Past Surgical History, Family History and Social History were reviewed in [employer] record.   Assessment and plan: Collagenous colitis with good previous responses to oral topical steroid preparations. I have restarted Entocort 9 mg a day for 2 weeks, then 6 mg a day for 2 weeks, then 3 mg daily as tolerated with office followup in 3 months time. I've suggested she otherwise continue medications as per primary care.

## 2012-01-01 NOTE — Patient Instructions (Addendum)
Discontinue your Lialda. You have been given samples of Entocort to take 3 tablets by mouth once daily x 2 weeks, decrease to 2 tablets daily x 2 weeks, then stay on 1 tablet once daily until scheduled office visit in 3 months.  CC: Dwana Melena, M.D.

## 2012-01-05 MED ORDER — BUDESONIDE 3 MG PO CP24: 3.0000 mg | ORAL_CAPSULE | ORAL | Status: DC

## 2012-01-05 NOTE — Telephone Encounter (Signed)
Patient verified Entocort Rx to go to local pharmacy and not mail order so I sent it to her local pharmacy for her.

## 2012-02-18 NOTE — Telephone Encounter (Signed)
rx was sent

## 2012-03-14 ENCOUNTER — Encounter: Payer: Self-pay | Admitting: Gastroenterology

## 2012-03-16 DIAGNOSIS — M545 Low back pain: Secondary | ICD-10-CM | POA: Diagnosis not present

## 2012-03-16 DIAGNOSIS — M76899 Other specified enthesopathies of unspecified lower limb, excluding foot: Secondary | ICD-10-CM | POA: Diagnosis not present

## 2012-04-07 ENCOUNTER — Encounter: Payer: Self-pay | Admitting: Gastroenterology

## 2012-04-07 ENCOUNTER — Ambulatory Visit (INDEPENDENT_AMBULATORY_CARE_PROVIDER_SITE_OTHER): Payer: Medicare Other | Admitting: Gastroenterology

## 2012-04-07 ENCOUNTER — Ambulatory Visit: Payer: Medicare Other | Admitting: Gastroenterology

## 2012-04-07 VITALS — BP 142/80 | HR 75 | Ht 63.0 in | Wt 132.4 lb

## 2012-04-07 DIAGNOSIS — K5289 Other specified noninfective gastroenteritis and colitis: Secondary | ICD-10-CM | POA: Diagnosis not present

## 2012-04-07 DIAGNOSIS — K52831 Collagenous colitis: Secondary | ICD-10-CM

## 2012-04-07 DIAGNOSIS — R197 Diarrhea, unspecified: Secondary | ICD-10-CM | POA: Diagnosis not present

## 2012-04-07 MED ORDER — BUDESONIDE 3 MG PO CP24: 3.0000 mg | ORAL_CAPSULE | ORAL | Status: DC

## 2012-04-07 NOTE — Patient Instructions (Addendum)
Please follow up with Dr. Jarold Motto in one year.  We have sent the following medications to your pharmacy for you to pick up at your convenience: Entocort. Please take one capsule by mouth every other day.

## 2012-04-07 NOTE — Progress Notes (Signed)
This is a 78 year old Caucasian female who has collagenous colitis diagnosed by colonoscopy and colonic biopsies.  She did not respond to by mouth oral aminosalicylate.  She did respond very well to by mouth Entocort, and has been able to decrease her dosage to 3 mg a day for the last 3 months.  She denies diarrhea, abdominal pain, any other GI or general medical issues at this time.  The patient does not take NSAIDs or other anti-inflammatories save and occasional aspirin for headache.  Current Medications, Allergies, Past Medical History, Past Surgical History, Family History and Social History were reviewed in [employer] record.  Pertinent Review of Systems Negative   Physical Exam: Blood pressure 142/80 pulse 75 and regular, and weight 132.  I cannot appreciate stigmata of chronic liver disease.  There is no hepatosplenomegaly, abdominal masses or tenderness.  Bowel sounds are normal.  Mental status is normal.  Rectal exam was deferred.    Assessment and Plan: Collagenous colitis under control on low dose oral Entocort therapy.  I will try to wean her off this medication and have suggested 3 mg every other day for one month as tolerated, then discontinue if possible with when necessary use in the future.  We'll see her in the future as needed for her GI problems. No diagnosis found.

## 2012-04-21 DIAGNOSIS — E039 Hypothyroidism, unspecified: Secondary | ICD-10-CM | POA: Diagnosis not present

## 2012-04-21 DIAGNOSIS — R0602 Shortness of breath: Secondary | ICD-10-CM | POA: Diagnosis not present

## 2012-04-21 DIAGNOSIS — I10 Essential (primary) hypertension: Secondary | ICD-10-CM | POA: Diagnosis not present

## 2012-04-21 DIAGNOSIS — E785 Hyperlipidemia, unspecified: Secondary | ICD-10-CM | POA: Diagnosis not present

## 2012-04-21 DIAGNOSIS — E782 Mixed hyperlipidemia: Secondary | ICD-10-CM | POA: Diagnosis not present

## 2012-05-19 DIAGNOSIS — Z961 Presence of intraocular lens: Secondary | ICD-10-CM | POA: Diagnosis not present

## 2012-05-19 DIAGNOSIS — H2640 Unspecified secondary cataract: Secondary | ICD-10-CM | POA: Diagnosis not present

## 2012-05-19 DIAGNOSIS — H401490 Capsular glaucoma with pseudoexfoliation of lens, unspecified eye, stage unspecified: Secondary | ICD-10-CM | POA: Diagnosis not present

## 2012-06-11 ENCOUNTER — Emergency Department (HOSPITAL_COMMUNITY)
Admission: EM | Admit: 2012-06-11 | Discharge: 2012-06-11 | Disposition: A | Discharge status: Home / Self Care | Payer: Medicare Other | Attending: Emergency Medicine | Admitting: Emergency Medicine

## 2012-06-11 ENCOUNTER — Encounter (HOSPITAL_COMMUNITY): Payer: Self-pay

## 2012-06-11 DIAGNOSIS — R040 Epistaxis: Secondary | ICD-10-CM | POA: Diagnosis not present

## 2012-06-11 DIAGNOSIS — I10 Essential (primary) hypertension: Secondary | ICD-10-CM | POA: Diagnosis not present

## 2012-06-11 DIAGNOSIS — Z8739 Personal history of other diseases of the musculoskeletal system and connective tissue: Secondary | ICD-10-CM | POA: Diagnosis not present

## 2012-06-11 DIAGNOSIS — H409 Unspecified glaucoma: Secondary | ICD-10-CM | POA: Insufficient documentation

## 2012-06-11 DIAGNOSIS — E039 Hypothyroidism, unspecified: Secondary | ICD-10-CM | POA: Insufficient documentation

## 2012-06-11 DIAGNOSIS — Z862 Personal history of diseases of the blood and blood-forming organs and certain disorders involving the immune mechanism: Secondary | ICD-10-CM | POA: Diagnosis not present

## 2012-06-11 DIAGNOSIS — Z8719 Personal history of other diseases of the digestive system: Secondary | ICD-10-CM | POA: Insufficient documentation

## 2012-06-11 DIAGNOSIS — Z8679 Personal history of other diseases of the circulatory system: Secondary | ICD-10-CM | POA: Diagnosis not present

## 2012-06-11 DIAGNOSIS — Z87440 Personal history of urinary (tract) infections: Secondary | ICD-10-CM | POA: Insufficient documentation

## 2012-06-11 DIAGNOSIS — IMO0002 Reserved for concepts with insufficient information to code with codable children: Secondary | ICD-10-CM | POA: Insufficient documentation

## 2012-06-11 DIAGNOSIS — Z79899 Other long term (current) drug therapy: Secondary | ICD-10-CM | POA: Diagnosis not present

## 2012-06-11 DIAGNOSIS — Z9849 Cataract extraction status, unspecified eye: Secondary | ICD-10-CM | POA: Insufficient documentation

## 2012-06-11 DIAGNOSIS — I498 Other specified cardiac arrhythmias: Secondary | ICD-10-CM | POA: Diagnosis not present

## 2012-06-11 DIAGNOSIS — Z961 Presence of intraocular lens: Secondary | ICD-10-CM | POA: Insufficient documentation

## 2012-06-11 DIAGNOSIS — Z8639 Personal history of other endocrine, nutritional and metabolic disease: Secondary | ICD-10-CM | POA: Insufficient documentation

## 2012-06-11 MED ORDER — OXYMETAZOLINE HCL 0.05 % NA SOLN: 2.0000 | Freq: Two times a day (BID) | NASAL | Status: DC

## 2012-06-11 NOTE — ED Notes (Signed)
Presents with "bloody nose x 4 hrs with little relief and multiple blood clots" per pt. Bleeding controled at this time. Pt states has had a bloody nose each month x 3.  No c/o voiced at this time.

## 2012-06-11 NOTE — ED Notes (Signed)
Pt reports nose bleed from r nare since 0730 this morning.  Denies injury.

## 2012-06-11 NOTE — ED Provider Notes (Signed)
History    This chart was scribed for Carleene Cooper III, MD by Charolett Bumpers, ED Scribe. The patient was seen in room APA12/APA12. Patient's care was started at 1204.   CSN: [id number]  Arrival date & time 06/11/12  1129   First MD Initiated Contact with Patient 06/11/12 1204      Chief Complaint  Patient presents with  . Epistaxis    The history is provided by the patient. No language interpreter was used.   Carol Wells is a 79 y.o. female who presents to the Emergency Department complaining of sudden onset right sided epistaxis that started at 7:15 am this morning after blowing her nose. She states that she has been having nosebleeds almost monthly since last November with today being her fourth nosebleed. She state that they normally occur for 20 minutes and then resolve. She states the current episode stopped while here in ED. She has been using humidifiers and nasal spray but states the nosebleeds continue to reoccur. She denies any known injuries or trauma.   Past Medical History  Diagnosis Date  . Chronic headaches   . Diverticulosis 2013    Colonoscopy  . Gastric ulcer   . Glaucoma(365)   . Hyperlipidemia   . Hypertension   . Hypothyroidism   . UTI (urinary tract infection)   . Arthritis   . Blood transfusion   . Cataract     REMOVED  . Collagenous colitis   . Internal hemorrhoids 2009    Colonoscopy    Past Surgical History  Procedure Laterality Date  . Total hip arthroplasty  2008    Right  . Laminectomy  03/2008  . Cataract extraction w/ intraocular lens implant  03/2010    Left  . Cataract extraction w/ intraocular lens implant  11/2010    Right  . Bunionectomy      Left  . Functional endoscopic sinus surgery    . Hand surgery    . Colonoscopy    . Polypectomy      Family History  Problem Relation Age of Onset  . Colon cancer Brother   . Colon polyps Brother     History  Substance Use Topics  . Smoking status: Never Smoker   .  Smokeless tobacco: Never Used  . Alcohol Use: Yes     Comment: occ    OB History   Grav Para Term Preterm Abortions TAB SAB Ect Mult Living                  Review of Systems  HENT: Positive for nosebleeds.   All other systems reviewed and are negative.    Allergies  Review of patient's allergies indicates no known allergies.  Home Medications   Current Outpatient Rx  Name  Route  Sig  Dispense  Refill  . AMBULATORY NON FORMULARY MEDICATION   Oral   Take 1 tablet by mouth daily. NORETHICIN 2.5         . budesonide (ENTOCORT EC) 3 MG 24 hr capsule   Oral   Take 1 capsule (3 mg total) by mouth every morning.   30 capsule   3   . calcium carbonate (OS-CAL) 600 MG TABS   Oral   Take 600 mg by mouth daily.           Marland Kitchen estradiol (ESTRACE) 0.5 MG tablet   Oral   Take 0.5 mg by mouth daily.           Marland Kitchen  latanoprost (XALATAN) 0.005 % ophthalmic solution   Both Eyes   Place 1 drop into both eyes at bedtime.           Marland Kitchen levothyroxine (SYNTHROID, LEVOTHROID) 100 MCG tablet   Oral   Take 100 mcg by mouth daily.           . Multiple Vitamin (MULTIVITAMIN) tablet   Oral   Take 1 tablet by mouth daily.           Marland Kitchen olmesartan (BENICAR) 20 MG tablet   Oral   Take 20 mg by mouth daily.           . Omega-3 Fatty Acids (FISH OIL) 1200 MG CAPS   Oral   Take 1 capsule by mouth daily.           . vitamin C (ASCORBIC ACID) 500 MG tablet   Oral   Take 500 mg by mouth daily.             Triage Vitals: BP 157/86  Temp(Src) 98.4 F (36.9 C) (Oral)  Resp 18  Ht 5\' 3"  (1.6 m)  Wt 132 lb (59.875 kg)  BMI 23.39 kg/m2  SpO2 99%  Physical Exam  Nursing note and vitals reviewed. Constitutional: She is oriented to person, place, and time. She appears well-developed and well-nourished. No distress.  HENT:  Head: Normocephalic and atraumatic.  Right Ear: External ear normal.  Left Ear: External ear normal.  Nose: Epistaxis is observed.  Mouth/Throat:  Oropharynx is clear and moist. No oropharyngeal exudate.  Clotted blood in the kiesselbach plexus. No active bleeding. No blood in the back of throat.   Eyes: Conjunctivae and EOM are normal. Pupils are equal, round, and reactive to light.  Neck: Normal range of motion. Neck supple. No tracheal deviation present.  Cardiovascular: Normal rate.   Pulmonary/Chest: Effort normal. No respiratory distress.  Abdominal: Soft. She exhibits no distension.  Musculoskeletal: Normal range of motion. She exhibits no edema.  Neurological: She is alert and oriented to person, place, and time.  Skin: Skin is warm and dry.  Psychiatric: She has a normal mood and affect. Her behavior is normal.    ED Course  Procedures (including critical care time)  DIAGNOSTIC STUDIES: Oxygen Saturation is 99% on room air, normal by my interpretation.    COORDINATION OF CARE:  12:05 PM  Date: 06/11/2012  Rate: 118  Rhythm: sinus tachycardia  QRS Axis: left  Intervals: normal  ST/T Wave abnormalities: normal  Conduction Disutrbances:none  Narrative Interpretation: Abnormal EKG  Old EKG Reviewed: none available   12:20 PM Discussed planned course of treatment with the patient including Afrin and f/u with ENT, who is agreeable at this time.   1. Epistaxis     I personally performed the services described in this documentation, which was scribed in my presence. The recorded information has been reviewed and is accurate. Osvaldo Human, M.D.     Carleene Cooper III, MD 06/11/12 [id number]

## 2012-06-13 ENCOUNTER — Ambulatory Visit (INDEPENDENT_AMBULATORY_CARE_PROVIDER_SITE_OTHER): Payer: Medicare Other | Admitting: Internal Medicine

## 2012-06-13 ENCOUNTER — Encounter: Payer: Self-pay | Admitting: Internal Medicine

## 2012-06-13 VITALS — BP 144/71 | HR 83 | Ht 63.0 in | Wt 134.8 lb

## 2012-06-13 DIAGNOSIS — R0602 Shortness of breath: Secondary | ICD-10-CM

## 2012-06-13 NOTE — Patient Instructions (Addendum)
Your physician has requested that you have an echocardiogram. Echocardiography is a painless test that uses sound waves to create images of your heart. It provides your doctor with information about the size and shape of your heart and how well your heart's chambers and valves are working. This procedure takes approximately one hour. There are no restrictions for this procedure.  Your physician recommends that you schedule a follow-up appointment as needed with Dr. [name].

## 2012-06-13 NOTE — Progress Notes (Addendum)
HPI Patient referred for exertional SOB.  No CP. No Known Allergies  Current Outpatient Prescriptions  Medication Sig Dispense Refill  . AMBULATORY NON FORMULARY MEDICATION Take 1 tablet by mouth daily. NORETHICIN 2.5      . budesonide (ENTOCORT EC) 3 MG 24 hr capsule Take 1 capsule (3 mg total) by mouth every morning.  30 capsule  3  . calcium carbonate (OS-CAL) 600 MG TABS Take 600 mg by mouth daily.        Marland Kitchen estradiol (ESTRACE) 0.5 MG tablet Take 0.5 mg by mouth daily.        Marland Kitchen latanoprost (XALATAN) 0.005 % ophthalmic solution Place 1 drop into both eyes at bedtime.        Marland Kitchen levothyroxine (SYNTHROID, LEVOTHROID) 100 MCG tablet Take 100 mcg by mouth daily.        . Multiple Vitamin (MULTIVITAMIN) tablet Take 1 tablet by mouth daily.        . NON FORMULARY Norethicin 2.5mg  QD      . olmesartan (BENICAR) 20 MG tablet Take 20 mg by mouth daily.        . Omega-3 Fatty Acids (FISH OIL) 1200 MG CAPS Take 1 capsule by mouth daily.        Marland Kitchen oxymetazoline (AFRIN NASAL SPRAY) 0.05 % nasal spray Place 2 sprays into the nose 2 (two) times daily.  30 mL  0  . vitamin C (ASCORBIC ACID) 500 MG tablet Take 500 mg by mouth daily.         No current facility-administered medications for this visit.    Past Medical History  Diagnosis Date  . Chronic headaches   . Diverticulosis 2013    Colonoscopy  . Gastric ulcer   . Glaucoma(365)   . Hyperlipidemia   . Hypertension   . Hypothyroidism   . UTI (urinary tract infection)   . Arthritis   . Blood transfusion   . Cataract     REMOVED  . Collagenous colitis   . Internal hemorrhoids 2009    Colonoscopy    Past Surgical History  Procedure Laterality Date  . Total hip arthroplasty  2008    Right  . Laminectomy  03/2008  . Cataract extraction w/ intraocular lens implant  03/2010    Left  . Cataract extraction w/ intraocular lens implant  11/2010    Right  . Bunionectomy      Left  . Functional endoscopic sinus surgery    . Hand surgery     . Colonoscopy    . Polypectomy      Family History  Problem Relation Age of Onset  . Colon cancer Brother   . Colon polyps Brother     History   Social History  . Marital Status: Married    Spouse Name: N/A    Number of Children: 2  . Years of Education: N/A   Occupational History  . Retired Charity fundraiser    Social History Main Topics  . Smoking status: Never Smoker   . Smokeless tobacco: Never Used  . Alcohol Use: Yes     Comment: occ  . Drug Use: No  . Sexually Active: Not on file   Other Topics Concern  . Not on file   Social History Narrative  . No narrative on file    Review of Systems:  All systems reviewed.  They are negative to the above problem except as previously stated.  Vital Signs: BP 144/71  Pulse 83  Ht 5\' 3"  (  1.6 m)  Wt 134 lb 12.8 oz (61.145 kg)  BMI 23.88 kg/m2  Physical Exam Patient is in NAD HEENT:  Normocephalic, atraumatic. EOMI, PERRLA.  Neck: JVP is normal.  No bruits.  Lungs: clear to auscultation. No rales no wheezes.  Heart: Regular rate and rhythm. Normal S1, S2. No S3.   No significant murmurs. PMI not displaced.  Abdomen:  Supple, nontender. Normal bowel sounds. No masses. No hepatomegaly.  Extremities:   Good distal pulses throughout. No lower extremity edema.  Musculoskeletal :moving all extremities.  Neuro:   alert and oriented x3.  CN II-XII grossly intact.  EKG:  ER 2/8)  ST. 118  Assessment and Plan:  1.  Dyspnea.  I am not convinced that her symptoms represent angina.  May represent some diastolic dysfunction  Would recommend echo  2.  Irreg HB  Patient denies symptoms  No Irreg today  3.  Health care maintenance.  Patient had been on statin in past.  Pravachol  Stopped in June  Lipids have come up since with LDL 134. Will review outside MRI  For atherosclerosis to determine aggressiveness of Rx.    4.  Nosebleed  To see ENT this wk  Does give HX of possible sleep apnea.  Would discuss with ENT  They need to eval her  upper airway    ADDENDUM:  Patient went on to have echo that showed Gr II diastolic dysfunction.  Normal LV systolic function Stress echo done that was positive for ischemia.   Recomm L heart cath to define anatomy.  Patient understands and agrees.    Dietrich Pates 10:54 PM.

## 2012-06-16 ENCOUNTER — Ambulatory Visit (INDEPENDENT_AMBULATORY_CARE_PROVIDER_SITE_OTHER): Payer: Medicare Other | Admitting: Otolaryngology

## 2012-06-21 ENCOUNTER — Ambulatory Visit (HOSPITAL_COMMUNITY): Payer: Medicare Other | Attending: Cardiovascular Disease

## 2012-06-21 DIAGNOSIS — R0989 Other specified symptoms and signs involving the circulatory and respiratory systems: Secondary | ICD-10-CM | POA: Diagnosis not present

## 2012-06-21 DIAGNOSIS — E785 Hyperlipidemia, unspecified: Secondary | ICD-10-CM | POA: Insufficient documentation

## 2012-06-21 DIAGNOSIS — I079 Rheumatic tricuspid valve disease, unspecified: Secondary | ICD-10-CM | POA: Insufficient documentation

## 2012-06-21 DIAGNOSIS — I059 Rheumatic mitral valve disease, unspecified: Secondary | ICD-10-CM | POA: Diagnosis not present

## 2012-06-21 DIAGNOSIS — R0602 Shortness of breath: Secondary | ICD-10-CM

## 2012-06-21 DIAGNOSIS — R0609 Other forms of dyspnea: Secondary | ICD-10-CM | POA: Insufficient documentation

## 2012-06-21 DIAGNOSIS — I10 Essential (primary) hypertension: Secondary | ICD-10-CM | POA: Diagnosis not present

## 2012-06-21 NOTE — Progress Notes (Signed)
Echocardiogram performed.  

## 2012-06-23 ENCOUNTER — Ambulatory Visit (INDEPENDENT_AMBULATORY_CARE_PROVIDER_SITE_OTHER): Payer: Medicare Other | Admitting: Otolaryngology

## 2012-06-23 DIAGNOSIS — R040 Epistaxis: Secondary | ICD-10-CM | POA: Diagnosis not present

## 2012-07-06 ENCOUNTER — Other Ambulatory Visit: Payer: Self-pay | Admitting: *Deleted

## 2012-07-06 ENCOUNTER — Telehealth: Payer: Self-pay | Admitting: Internal Medicine

## 2012-07-06 DIAGNOSIS — I5189 Other ill-defined heart diseases: Secondary | ICD-10-CM

## 2012-07-06 DIAGNOSIS — R0989 Other specified symptoms and signs involving the circulatory and respiratory systems: Secondary | ICD-10-CM

## 2012-07-06 NOTE — Telephone Encounter (Signed)
New problem   Pt had Echocardiogram 06/21/12 and pt received email stating the results was in MyCharts but no results was in MyCharts when she went to look. Pt would like to know her results

## 2012-07-06 NOTE — Telephone Encounter (Signed)
Will forward to Dr. [name] to review ECHO prior to calling pt back.

## 2012-07-21 ENCOUNTER — Ambulatory Visit (INDEPENDENT_AMBULATORY_CARE_PROVIDER_SITE_OTHER): Payer: Medicare Other | Admitting: Otolaryngology

## 2012-07-21 DIAGNOSIS — R040 Epistaxis: Secondary | ICD-10-CM

## 2012-07-21 DIAGNOSIS — H9209 Otalgia, unspecified ear: Secondary | ICD-10-CM | POA: Diagnosis not present

## 2012-07-22 ENCOUNTER — Ambulatory Visit (HOSPITAL_COMMUNITY): Payer: Medicare Other | Attending: Cardiology | Admitting: Radiology

## 2012-07-22 ENCOUNTER — Encounter: Payer: Self-pay | Admitting: Internal Medicine

## 2012-07-22 ENCOUNTER — Ambulatory Visit (HOSPITAL_COMMUNITY): Payer: Medicare Other | Attending: Cardiology

## 2012-07-22 DIAGNOSIS — E785 Hyperlipidemia, unspecified: Secondary | ICD-10-CM | POA: Insufficient documentation

## 2012-07-22 DIAGNOSIS — R0602 Shortness of breath: Secondary | ICD-10-CM

## 2012-07-22 DIAGNOSIS — I5189 Other ill-defined heart diseases: Secondary | ICD-10-CM

## 2012-07-22 DIAGNOSIS — R0609 Other forms of dyspnea: Secondary | ICD-10-CM | POA: Insufficient documentation

## 2012-07-22 DIAGNOSIS — I10 Essential (primary) hypertension: Secondary | ICD-10-CM | POA: Insufficient documentation

## 2012-07-22 DIAGNOSIS — R0989 Other specified symptoms and signs involving the circulatory and respiratory systems: Secondary | ICD-10-CM

## 2012-07-22 NOTE — Progress Notes (Signed)
Stress Echocardiogram performed.  

## 2012-07-25 ENCOUNTER — Encounter: Payer: Self-pay | Admitting: *Deleted

## 2012-07-25 ENCOUNTER — Other Ambulatory Visit (INDEPENDENT_AMBULATORY_CARE_PROVIDER_SITE_OTHER): Payer: Medicare Other

## 2012-07-25 ENCOUNTER — Other Ambulatory Visit: Payer: Self-pay | Admitting: *Deleted

## 2012-07-25 ENCOUNTER — Encounter (INDEPENDENT_AMBULATORY_CARE_PROVIDER_SITE_OTHER): Payer: Medicare Other

## 2012-07-25 DIAGNOSIS — R0989 Other specified symptoms and signs involving the circulatory and respiratory systems: Secondary | ICD-10-CM | POA: Diagnosis not present

## 2012-07-25 DIAGNOSIS — R079 Chest pain, unspecified: Secondary | ICD-10-CM | POA: Diagnosis not present

## 2012-07-25 DIAGNOSIS — I6529 Occlusion and stenosis of unspecified carotid artery: Secondary | ICD-10-CM

## 2012-07-25 LAB — BASIC METABOLIC PANEL
BUN: 14 mg/dL (ref 6–23)
Creatinine, Ser: 0.8 mg/dL (ref 0.4–1.2)
GFR: 72.51 mL/min (ref >60.00)

## 2012-07-25 LAB — CBC WITH DIFFERENTIAL/PLATELET
Basophils Absolute: 0 10*3/uL (ref 0.0–0.1)
Eosinophils Relative: 1.6 % (ref 0.0–5.0)
MCV: 89.2 fl (ref 78.0–100.0)
Monocytes Absolute: 0.4 10*3/uL (ref 0.1–1.0)
Neutrophils Relative %: 72.9 % (ref 43.0–77.0)
Platelets: 334 10*3/uL (ref 150.0–400.0)
WBC: 7.8 10*3/uL (ref 4.5–10.5)

## 2012-07-25 LAB — PROTIME-INR
INR: 1 ratio (ref 0.8–1.0)
Prothrombin Time: 11 s (ref 10.2–12.4)

## 2012-07-27 DIAGNOSIS — M76899 Other specified enthesopathies of unspecified lower limb, excluding foot: Secondary | ICD-10-CM | POA: Diagnosis not present

## 2012-07-29 ENCOUNTER — Other Ambulatory Visit: Payer: Self-pay | Admitting: *Deleted

## 2012-07-29 DIAGNOSIS — I6523 Occlusion and stenosis of bilateral carotid arteries: Secondary | ICD-10-CM

## 2012-07-29 MED ORDER — ATORVASTATIN CALCIUM 10 MG PO TABS: 10.0000 mg | ORAL_TABLET | Freq: Every day | ORAL | Status: DC

## 2012-08-01 ENCOUNTER — Encounter (HOSPITAL_BASED_OUTPATIENT_CLINIC_OR_DEPARTMENT_OTHER)
Admission: RE | Disposition: A | Discharge status: Home / Self Care | Payer: Self-pay | Source: Ambulatory Visit | Attending: Cardiovascular Disease

## 2012-08-01 ENCOUNTER — Inpatient Hospital Stay (HOSPITAL_BASED_OUTPATIENT_CLINIC_OR_DEPARTMENT_OTHER)
Admission: RE | Admit: 2012-08-01 | Discharge: 2012-08-01 | Disposition: A | Discharge status: Home / Self Care | Payer: Medicare Other | Source: Ambulatory Visit | Attending: Cardiovascular Disease | Admitting: Cardiovascular Disease

## 2012-08-01 DIAGNOSIS — R0989 Other specified symptoms and signs involving the circulatory and respiratory systems: Secondary | ICD-10-CM | POA: Diagnosis not present

## 2012-08-01 DIAGNOSIS — E785 Hyperlipidemia, unspecified: Secondary | ICD-10-CM | POA: Diagnosis not present

## 2012-08-01 DIAGNOSIS — R0609 Other forms of dyspnea: Secondary | ICD-10-CM | POA: Diagnosis not present

## 2012-08-01 DIAGNOSIS — I10 Essential (primary) hypertension: Secondary | ICD-10-CM | POA: Insufficient documentation

## 2012-08-01 DIAGNOSIS — R9389 Abnormal findings on diagnostic imaging of other specified body structures: Secondary | ICD-10-CM

## 2012-08-01 SURGERY — JV LEFT HEART CATHETERIZATION WITH CORONARY ANGIOGRAM
Anesthesia: Moderate Sedation

## 2012-08-01 MED ORDER — ONDANSETRON HCL 4 MG/2ML IJ SOLN: 4.0000 mg | Freq: Four times a day (QID) | INTRAMUSCULAR | Status: DC | PRN

## 2012-08-01 MED ORDER — SODIUM CHLORIDE 0.9 % IJ SOLN: 3.0000 mL | INTRAMUSCULAR | Status: DC | PRN

## 2012-08-01 MED ORDER — SODIUM CHLORIDE 0.9 % IV SOLN: 250.0000 mL | INTRAVENOUS | Status: DC | PRN

## 2012-08-01 MED ORDER — SODIUM CHLORIDE 0.9 % IJ SOLN
3.0000 mL | Freq: Two times a day (BID) | INTRAMUSCULAR | Status: DC
Start: 2012-08-01 — End: 2012-08-01

## 2012-08-01 MED ORDER — ACETAMINOPHEN 325 MG PO TABS: 650.0000 mg | ORAL_TABLET | ORAL | Status: DC | PRN

## 2012-08-01 MED ORDER — SODIUM CHLORIDE 0.9 % IV SOLN
INTRAVENOUS | Status: DC
  Administered 2012-08-01: 09:00:00 via INTRAVENOUS

## 2012-08-01 MED ORDER — ASPIRIN 81 MG PO CHEW
324.0000 mg | CHEWABLE_TABLET | ORAL | Status: AC
  Administered 2012-08-01: 324 mg via ORAL

## 2012-08-01 MED ORDER — SODIUM CHLORIDE 0.9 % IV SOLN: INTRAVENOUS | Status: AC

## 2012-08-01 NOTE — Interval H&P Note (Signed)
History and Physical Interval Note:  08/01/2012 9:11 AM  Carol Wells  has presented today for cardiac cath  with the diagnosis of dyspnea  and positive stress ECHO  The various methods of treatment have been discussed with the patient and family. After consideration of risks, benefits and other options for treatment, the patient has consented to  Procedure(s): JV LEFT HEART CATHETERIZATION WITH CORONARY ANGIOGRAM (N/A) as a surgical intervention .  The patient's history has been reviewed, patient examined, no change in status, stable for surgery.  I have reviewed the patient's chart and labs.  Questions were answered to the patient's satisfaction.     Dawaun Brancato

## 2012-08-01 NOTE — CV Procedure (Signed)
   Cardiac Catheterization Operative Report  Carol Wells [id number] 3/31/20149:43 AM Dwana Melena, MD  Procedure Performed:  1. Left Heart Catheterization 2. Selective Coronary Angiography 3. Left ventricular angiogram  Operator: Verne Carrow, MD  Indication:  79 yo female with history of HTN, HLD and recent c/o exertional dyspnea. Stress echo with possible ischemia. Cardiac cath to exclude obstructive CAD.                                      Procedure Details: The risks, benefits, complications, treatment options, and expected outcomes were discussed with the patient. The patient and/or family concurred with the proposed plan, giving informed consent. The patient was brought to the cath lab after IV hydration was begun and oral premedication was given. The patient was further sedated with Versed and Fentanyl. The right groin was prepped and draped in the usual manner. Using the modified Seldinger access technique, a 4 French sheath was placed in the right femoral artery. Standard diagnostic catheters were used to perform selective coronary angiography. A pigtail catheter was used to perform a left ventricular angiogram.  There were no immediate complications. The patient was taken to the recovery area in stable condition.   Hemodynamic Findings: Central aortic pressure: 163/75 Left ventricular pressure: 159/13/17  Angiographic Findings:  Left main: No obstructive disease  Left Anterior Descending Artery: Large caliber vessel that courses to the apex. There is one moderate caliber diagonal branch. No obstructive disease.   Circumflex Artery: Moderate caliber vessel with two obtuse marginal branches. No obstructive disease.   Right Coronary Artery: Moderate caliber dominant vessel with no obstructive disease.   Left Ventricular Angiogram: LVEF=65%.   Impression: 1. No angiographic evidence of CAD 2. Normal LV systolic function  Recommendations: No further ischemic  workup.        Complications:  None. The patient tolerated the procedure well.

## 2012-08-01 NOTE — H&P (Signed)
   History of Present Illness: 78 yo female with history of HTN, HLD with recent c/o dyspnea. She was seen in the office by Dr. [name] and stress echo with possible ischemia.   Cardiologist: [name]  Past Medical History  Diagnosis Date  . Chronic headaches   . Diverticulosis 2013    Colonoscopy  . Gastric ulcer   . Glaucoma(365)   . Hyperlipidemia   . Hypertension   . Hypothyroidism   . UTI (urinary tract infection)   . Arthritis   . Blood transfusion   . Cataract     REMOVED  . Collagenous colitis   . Internal hemorrhoids 2009    Colonoscopy    Past Surgical History  Procedure Laterality Date  . Total hip arthroplasty  2008    Right  . Laminectomy  03/2008  . Cataract extraction w/ intraocular lens implant  03/2010    Left  . Cataract extraction w/ intraocular lens implant  11/2010    Right  . Bunionectomy      Left  . Functional endoscopic sinus surgery    . Hand surgery    . Colonoscopy    . Polypectomy      Current Facility-Administered Medications  Medication Dose Route Frequency Provider Last Rate Last Dose  . [START ON 08/07/2012] 0.9 %  sodium chloride infusion   Intravenous Continuous Pricilla Riffle, MD 75 mL/hr at 08/01/12 0845      No Known Allergies  History   Social History  . Marital Status: Married    Spouse Name: N/A    Number of Children: 2  . Years of Education: N/A   Occupational History  . Retired Charity fundraiser    Social History Main Topics  . Smoking status: Never Smoker   . Smokeless tobacco: Never Used  . Alcohol Use: Yes     Comment: occ  . Drug Use: No  . Sexually Active: Not on file   Other Topics Concern  . Not on file   Social History Narrative  . No narrative on file    Family History  Problem Relation Age of Onset  . Colon cancer Brother   . Colon polyps Brother     Review of Systems:  As stated in the HPI and otherwise negative.   BP 157/77  Pulse 68  Resp 16  Ht 5\' 3"  (1.6 m)  Wt 134 lb (60.782 kg)  BMI 23.74 kg/m2   SpO2 99%  Physical Examination: General: Well developed, well nourished, NAD HEENT: OP clear, mucus membranes moist SKIN: warm, dry. No rashes. Neuro: No focal deficits Musculoskeletal: Muscle strength 5/5 all ext Psychiatric: Mood and affect normal Neck: No JVD, no carotid bruits, no thyromegaly, no lymphadenopathy. Lungs:Clear bilaterally, no wheezes, rhonci, crackles Cardiovascular: Regular rate and rhythm. No murmurs, gallops or rubs. Abdomen:Soft. Bowel sounds present. Non-tender.  Extremities: No lower extremity edema. Pulses are 2 + in the bilateral DP/PT.   Assessment and Plan:   1. Dyspnea: Positive stress test. Cardiac cath per Dr. [name] to exclude CAD.

## 2012-08-01 NOTE — Progress Notes (Signed)
Bedrest begins @ 0950, tegaderm dressing applied to right groin site, site level 0.

## 2012-08-01 NOTE — Progress Notes (Signed)
Allen's test performed on right hand with positive results. 

## 2012-08-05 ENCOUNTER — Telehealth: Payer: Self-pay | Admitting: Internal Medicine

## 2012-08-05 MED ORDER — ATORVASTATIN CALCIUM 10 MG PO TABS: 10.0000 mg | ORAL_TABLET | Freq: Every day | ORAL | Status: DC

## 2012-08-05 NOTE — Telephone Encounter (Signed)
30 day supply sent to [facility] [place]  

## 2012-08-05 NOTE — Telephone Encounter (Signed)
New Prob   Would like to speak to someone regarding lipitor prescription. Would like to speak to nurse.

## 2012-08-16 ENCOUNTER — Encounter: Payer: Self-pay | Admitting: *Deleted

## 2012-08-16 NOTE — Telephone Encounter (Signed)
This encounter was created in error - please disregard.

## 2012-08-17 ENCOUNTER — Encounter: Payer: Self-pay | Admitting: Internal Medicine

## 2012-08-18 ENCOUNTER — Telehealth: Payer: Self-pay | Admitting: Internal Medicine

## 2012-08-18 ENCOUNTER — Encounter: Payer: Medicare Other | Admitting: Physician Assistant

## 2012-08-18 NOTE — Telephone Encounter (Signed)
PT CALLED BACK RE MESSAGE LEFT  FROM [facility] STATED DR ROSS WANTED TO SEE PT  NOT SURE WHERE TO  SCHEDULE PT PT AWARE WILL FORWARD TO  [facility] TO RESCHEDULE APPT IS CURRENTLY SCHEDULED  WITH  SCOTT WEAVER PAC ON  08-25-12 AT 9:50 AM  PT AWARE [facility] NOT BACK IN OFFICE UNTIL  TUES  06-25-12 ./CY

## 2012-08-18 NOTE — Telephone Encounter (Signed)
New Problem:    Patient called in returning [facility]'s call form 08/05/12.  Please call back.

## 2012-08-23 NOTE — Telephone Encounter (Signed)
Follow up ° ° °Previous message °

## 2012-08-24 ENCOUNTER — Ambulatory Visit (INDEPENDENT_AMBULATORY_CARE_PROVIDER_SITE_OTHER): Payer: Medicare Other | Admitting: Physician Assistant

## 2012-08-24 ENCOUNTER — Encounter: Payer: Self-pay | Admitting: Physician Assistant

## 2012-08-24 VITALS — BP 170/98 | HR 77 | Ht 63.0 in | Wt 132.0 lb

## 2012-08-24 DIAGNOSIS — I658 Occlusion and stenosis of other precerebral arteries: Secondary | ICD-10-CM

## 2012-08-24 DIAGNOSIS — I10 Essential (primary) hypertension: Secondary | ICD-10-CM

## 2012-08-24 DIAGNOSIS — R0609 Other forms of dyspnea: Secondary | ICD-10-CM | POA: Diagnosis not present

## 2012-08-24 DIAGNOSIS — R0683 Snoring: Secondary | ICD-10-CM

## 2012-08-24 DIAGNOSIS — I6523 Occlusion and stenosis of bilateral carotid arteries: Secondary | ICD-10-CM

## 2012-08-24 DIAGNOSIS — E785 Hyperlipidemia, unspecified: Secondary | ICD-10-CM

## 2012-08-24 DIAGNOSIS — R0602 Shortness of breath: Secondary | ICD-10-CM | POA: Diagnosis not present

## 2012-08-24 MED ORDER — ASPIRIN EC 81 MG PO TBEC: 81.0000 mg | DELAYED_RELEASE_TABLET | Freq: Every day | ORAL | Status: DC

## 2012-08-24 NOTE — Telephone Encounter (Signed)
Attempted to contact pt before I went out of town to reschedule appt per Dr. [name]' request.  Unable to reach pt after numerous attempts.  Left detailed instructions on what needed to be done if patient called in my absence.  However, when patient called back, my instructions were not followed and patient saw Carol Wells today at 9:50am.

## 2012-08-24 NOTE — Patient Instructions (Addendum)
PLEASE SCHEDULE A SLEEP STUDY; PER SCOTT WEAVER, PAC THIS IS NOT TO BE A SPLIT NIGHT STUDY DX SNORING  PLEASE FOLLOW UP WITH DR. ROSS IN 3 MONTHS  START ASPIRIN 81 MG DAILY  FASTING LIPID AND LIVER PANEL TO BE DONE IN 6-8 WEEKS

## 2012-08-24 NOTE — Progress Notes (Signed)
[address] [place], [place]  [address] Phone: [phone] Fax:  [phone]  Date:  08/24/2012   ID:  AMEILA WELDON, DOB 11/06/1931, MRN [id number]  PCP:  Dwana Melena, MD  Primary Cardiologist:  Dr. Dietrich Pates     History of Present Illness: Carol Wells is a 79 y.o. female who returns for f/u after recent heart catheterization.  She has a history of HTN, HL and exertional dyspnea. Echocardiogram 06/21/12: Mild LVH, EF 60-65%, grade 2 diastolic dysfunction, mild MR.  Dopplers 3/14: RICA 40-59%, LICA 0-39%.  She recently underwent stress echocardiogram that was abnormal suggestive of ischemia. She was set up for cardiac catheterization. LHC 2/31/14:  No angiographic CAD, EF 65%.  She is doing well.  She has not had as much DOE recently.  Went to [place] and walked 3 hours without stopping.  She is likely NYHA Class II.  No orthopnea, PND, edema.  No CP.  No syncope.    Labs (3/14):  K 4.2, Cr 0.8, Hgb 12   Wt Readings from Last 3 Encounters:  08/24/12 132 lb (59.875 kg)  08/01/12 134 lb (60.782 kg)  08/01/12 134 lb (60.782 kg)     Past Medical History  Diagnosis Date  . Chronic headaches   . Diverticulosis 2013    Colonoscopy  . Gastric ulcer   . Glaucoma(365)   . Hyperlipidemia   . Hypertension   . Hypothyroidism   . UTI (urinary tract infection)   . Arthritis   . Blood transfusion   . Cataract     REMOVED  . Collagenous colitis   . Internal hemorrhoids 2009    Colonoscopy  . Hx of echocardiogram     Echocardiogram 06/21/12: Mild LVH, EF 60-65%, grade 2 diastolic dysfunction, mild MR  . Hx of cardiac catheterization     LHC 3/14: no angiographic CAD, EF 65%  . Carotid stenosis     Dopplers 3/14: RICA 40-59%, LICA 0-39%    Current Outpatient Prescriptions  Medication Sig Dispense Refill  . AMBULATORY NON FORMULARY MEDICATION Take 1 tablet by mouth daily. NORETHICIN 2.5      . atorvastatin (LIPITOR) 10 MG tablet Take 1 tablet (10 mg total)  by mouth daily.  30 tablet  3  . budesonide (ENTOCORT EC) 3 MG 24 hr capsule Take 1 capsule (3 mg total) by mouth every morning.  30 capsule  3  . calcium carbonate (OS-CAL) 600 MG TABS Take 600 mg by mouth daily.        Marland Kitchen estradiol (ESTRACE) 0.5 MG tablet Take 0.5 mg by mouth daily.        Marland Kitchen latanoprost (XALATAN) 0.005 % ophthalmic solution Place 1 drop into both eyes at bedtime.        Marland Kitchen levothyroxine (SYNTHROID, LEVOTHROID) 100 MCG tablet Take 100 mcg by mouth daily.        . Multiple Vitamin (MULTIVITAMIN) tablet Take 1 tablet by mouth daily.        Marland Kitchen olmesartan (BENICAR) 20 MG tablet Take 20 mg by mouth daily.        . Omega-3 Fatty Acids (FISH OIL) 1200 MG CAPS Take 1 capsule by mouth daily.        . vitamin C (ASCORBIC ACID) 500 MG tablet Take 500 mg by mouth daily.         No current facility-administered medications for this visit.    Allergies:   No Known Allergies  Social History:  The  patient  reports that she has never smoked. She has never used smokeless tobacco. She reports that  drinks alcohol. She reports that she does not use illicit drugs.   ROS:  Please see the history of present illness.   Her husband states she snores and he has witnessed apneic episodes.  She admits to napping often.   All other systems reviewed and negative.   PHYSICAL EXAM: VS:  BP 170/98  Pulse 77  Ht 5\' 3"  (1.6 m)  Wt 132 lb (59.875 kg)  BMI 23.39 kg/m2 Well nourished, well developed, in no acute distress HEENT: normal Neck: no JVD Cardiac:  normal S1, S2; RRR; no murmur Lungs:  clear to auscultation bilaterally, no wheezing, rhonchi or rales Abd: soft, nontender, no hepatomegaly Ext: no edema; right groin without hematoma or bruit  Skin: warm and dry Neuro:  CNs 2-12 intact, no focal abnormalities noted  EKG:  NSR, HR 77, LAD, no acute changes     ASSESSMENT AND PLAN:  1. Dyspnea:  No CAD on cath.  She does have diastolic dysfunction.  However, no signs of volume overload on exam.   I do not think a BNP would be helpful and I am not convinced she would benefit from the addition of a diuretic.  Continue current Rx.  2. Hypertension:  She has "white coat" HTN.  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  It is usually 130/80.  Continue current Rx. 3. Hyperlipidemia:  Check Lipids and LFTs in 6 weeks.   4. Carotid Stenosis:  She will need a repeat doppler in 1 year.  Start ASA 81 mg QD. 5. Snoring:  Arrange sleep study. 6. Disposition:  F/u with Dr. Dietrich Pates in 3 mos.   Signed, Tereso Newcomer, PA-C  10:08 AM 08/24/2012

## 2012-09-07 DIAGNOSIS — H2640 Unspecified secondary cataract: Secondary | ICD-10-CM | POA: Diagnosis not present

## 2012-09-07 DIAGNOSIS — H401490 Capsular glaucoma with pseudoexfoliation of lens, unspecified eye, stage unspecified: Secondary | ICD-10-CM | POA: Diagnosis not present

## 2012-09-07 DIAGNOSIS — Z961 Presence of intraocular lens: Secondary | ICD-10-CM | POA: Diagnosis not present

## 2012-09-08 ENCOUNTER — Other Ambulatory Visit: Payer: Self-pay

## 2012-09-08 DIAGNOSIS — Z1231 Encounter for screening mammogram for malignant neoplasm of breast: Secondary | ICD-10-CM

## 2012-09-12 ENCOUNTER — Encounter (HOSPITAL_BASED_OUTPATIENT_CLINIC_OR_DEPARTMENT_OTHER): Payer: Medicare Other

## 2012-09-20 DIAGNOSIS — H26499 Other secondary cataract, unspecified eye: Secondary | ICD-10-CM | POA: Diagnosis not present

## 2012-09-20 DIAGNOSIS — H2640 Unspecified secondary cataract: Secondary | ICD-10-CM | POA: Diagnosis not present

## 2012-09-28 ENCOUNTER — Encounter: Payer: Self-pay | Admitting: Internal Medicine

## 2012-10-05 ENCOUNTER — Other Ambulatory Visit: Payer: Medicare Other

## 2012-10-06 ENCOUNTER — Other Ambulatory Visit (INDEPENDENT_AMBULATORY_CARE_PROVIDER_SITE_OTHER): Payer: Medicare Other

## 2012-10-06 DIAGNOSIS — I658 Occlusion and stenosis of other precerebral arteries: Secondary | ICD-10-CM | POA: Diagnosis not present

## 2012-10-06 DIAGNOSIS — I6529 Occlusion and stenosis of unspecified carotid artery: Secondary | ICD-10-CM

## 2012-10-06 DIAGNOSIS — I6523 Occlusion and stenosis of bilateral carotid arteries: Secondary | ICD-10-CM

## 2012-10-06 LAB — LIPID PANEL
Cholesterol: 123 mg/dL (ref 0–200)
HDL: 37 mg/dL — ABNORMAL LOW (ref >39.00)
LDL Cholesterol: 74 mg/dL (ref 0–99)
Total CHOL/HDL Ratio: 3
Triglycerides: 60 mg/dL (ref 0.0–149.0)

## 2012-10-12 ENCOUNTER — Encounter: Payer: Self-pay | Admitting: Internal Medicine

## 2012-10-20 ENCOUNTER — Other Ambulatory Visit: Payer: Self-pay | Admitting: Gastroenterology

## 2012-10-21 ENCOUNTER — Ambulatory Visit
Admission: RE | Admit: 2012-10-21 | Discharge: 2012-10-21 | Disposition: A | Discharge status: Home / Self Care | Payer: Medicare Other | Source: Ambulatory Visit

## 2012-10-21 DIAGNOSIS — Z1231 Encounter for screening mammogram for malignant neoplasm of breast: Secondary | ICD-10-CM

## 2012-10-24 DIAGNOSIS — G5760 Lesion of plantar nerve, unspecified lower limb: Secondary | ICD-10-CM | POA: Diagnosis not present

## 2012-10-24 DIAGNOSIS — M2010 Hallux valgus (acquired), unspecified foot: Secondary | ICD-10-CM | POA: Diagnosis not present

## 2012-10-24 DIAGNOSIS — M79609 Pain in unspecified limb: Secondary | ICD-10-CM | POA: Diagnosis not present

## 2012-10-24 DIAGNOSIS — M779 Enthesopathy, unspecified: Secondary | ICD-10-CM | POA: Diagnosis not present

## 2012-10-25 ENCOUNTER — Other Ambulatory Visit: Payer: Self-pay | Admitting: Obstetrics and Gynecology

## 2012-10-25 DIAGNOSIS — R928 Other abnormal and inconclusive findings on diagnostic imaging of breast: Secondary | ICD-10-CM

## 2012-10-26 DIAGNOSIS — Z124 Encounter for screening for malignant neoplasm of cervix: Secondary | ICD-10-CM | POA: Diagnosis not present

## 2012-10-26 DIAGNOSIS — Z01419 Encounter for gynecological examination (general) (routine) without abnormal findings: Secondary | ICD-10-CM | POA: Diagnosis not present

## 2012-10-26 DIAGNOSIS — R350 Frequency of micturition: Secondary | ICD-10-CM | POA: Diagnosis not present

## 2012-10-31 DIAGNOSIS — G5760 Lesion of plantar nerve, unspecified lower limb: Secondary | ICD-10-CM | POA: Diagnosis not present

## 2012-10-31 DIAGNOSIS — M779 Enthesopathy, unspecified: Secondary | ICD-10-CM | POA: Diagnosis not present

## 2012-10-31 DIAGNOSIS — M722 Plantar fascial fibromatosis: Secondary | ICD-10-CM | POA: Diagnosis not present

## 2012-11-07 ENCOUNTER — Ambulatory Visit
Admission: RE | Admit: 2012-11-07 | Discharge: 2012-11-07 | Disposition: A | Discharge status: Home / Self Care | Payer: Medicare Other | Source: Ambulatory Visit | Attending: Obstetrics and Gynecology | Admitting: Obstetrics and Gynecology

## 2012-11-07 DIAGNOSIS — R928 Other abnormal and inconclusive findings on diagnostic imaging of breast: Secondary | ICD-10-CM

## 2012-12-01 ENCOUNTER — Ambulatory Visit: Payer: Medicare Other | Admitting: Internal Medicine

## 2012-12-07 ENCOUNTER — Other Ambulatory Visit: Payer: Self-pay

## 2012-12-23 ENCOUNTER — Ambulatory Visit: Payer: Medicare Other | Admitting: Internal Medicine

## 2012-12-29 DIAGNOSIS — M545 Low back pain: Secondary | ICD-10-CM | POA: Diagnosis not present

## 2012-12-29 DIAGNOSIS — M961 Postlaminectomy syndrome, not elsewhere classified: Secondary | ICD-10-CM | POA: Diagnosis not present

## 2012-12-29 DIAGNOSIS — M47817 Spondylosis without myelopathy or radiculopathy, lumbosacral region: Secondary | ICD-10-CM | POA: Diagnosis not present

## 2013-01-03 DIAGNOSIS — N390 Urinary tract infection, site not specified: Secondary | ICD-10-CM | POA: Diagnosis not present

## 2013-01-04 DIAGNOSIS — M545 Low back pain: Secondary | ICD-10-CM | POA: Diagnosis not present

## 2013-01-11 DIAGNOSIS — I10 Essential (primary) hypertension: Secondary | ICD-10-CM | POA: Diagnosis not present

## 2013-01-11 DIAGNOSIS — M549 Dorsalgia, unspecified: Secondary | ICD-10-CM | POA: Diagnosis not present

## 2013-01-11 DIAGNOSIS — E039 Hypothyroidism, unspecified: Secondary | ICD-10-CM | POA: Diagnosis not present

## 2013-01-11 DIAGNOSIS — Z23 Encounter for immunization: Secondary | ICD-10-CM | POA: Diagnosis not present

## 2013-01-16 ENCOUNTER — Ambulatory Visit (INDEPENDENT_AMBULATORY_CARE_PROVIDER_SITE_OTHER): Payer: Medicare Other | Admitting: Internal Medicine

## 2013-01-16 ENCOUNTER — Encounter: Payer: Self-pay | Admitting: Internal Medicine

## 2013-01-16 VITALS — BP 160/98 | HR 72 | Ht 62.5 in | Wt 132.0 lb

## 2013-01-16 DIAGNOSIS — I10 Essential (primary) hypertension: Secondary | ICD-10-CM | POA: Diagnosis not present

## 2013-01-16 DIAGNOSIS — I658 Occlusion and stenosis of other precerebral arteries: Secondary | ICD-10-CM | POA: Diagnosis not present

## 2013-01-16 DIAGNOSIS — I6529 Occlusion and stenosis of unspecified carotid artery: Secondary | ICD-10-CM | POA: Diagnosis not present

## 2013-01-16 DIAGNOSIS — I6523 Occlusion and stenosis of bilateral carotid arteries: Secondary | ICD-10-CM

## 2013-01-16 NOTE — Progress Notes (Signed)
Date:  01/16/2013   ID:  Carol Wells, DOB 1932-04-12, MRN [id number]  PCP:  Catalina Pizza, MD     HPI: Patient is a 79 yo with a history of SOB  Echo with Gr II diastolic dsyfunction Mild MR   Stres echo suggested ischemia.  Went on to  Cardiac cath in 06/2012:  No CAD  LVEF 65%.  She also has a history of HTN with white coat effect. Since seen she has done well.  She does not complain of SOB  Denies CP    Wt Readings from Last 3 Encounters:  01/16/13 132 lb (59.875 kg)  08/24/12 132 lb (59.875 kg)  08/01/12 134 lb (60.782 kg)     Past Medical History  Diagnosis Date  . Chronic headaches   . Diverticulosis 2013    Colonoscopy  . Gastric ulcer   . Glaucoma   . Hyperlipidemia   . Hypertension   . Hypothyroidism   . UTI (urinary tract infection)   . Arthritis   . Blood transfusion   . Cataract     REMOVED  . Collagenous colitis   . Internal hemorrhoids 2009    Colonoscopy  . Hx of echocardiogram     Echocardiogram 06/21/12: Mild LVH, EF 60-65%, grade 2 diastolic dysfunction, mild MR  . Hx of cardiac catheterization     LHC 3/14: no angiographic CAD, EF 65%  . Carotid stenosis     Dopplers 3/14: RICA 40-59%, LICA 0-39%    Current Outpatient Prescriptions  Medication Sig Dispense Refill  . AMBULATORY NON FORMULARY MEDICATION Take 1 tablet by mouth daily. NORETHICIN 2.5      . atorvastatin (LIPITOR) 10 MG tablet Take 1 tablet (10 mg total) by mouth daily.  30 tablet  3  . budesonide (ENTOCORT EC) 3 MG 24 hr capsule Take 3 mg by mouth every morning. Pt takes one capsul every other day      . calcium carbonate (OS-CAL) 600 MG TABS Take 600 mg by mouth daily.        Marland Kitchen estradiol (ESTRACE) 0.5 MG tablet Take 0.5 mg by mouth daily.        Marland Kitchen latanoprost (XALATAN) 0.005 % ophthalmic solution Place 1 drop into both eyes at bedtime.        Marland Kitchen levothyroxine (SYNTHROID, LEVOTHROID) 100 MCG tablet Take 100 mcg by mouth daily.        . Multiple Vitamin (MULTIVITAMIN) tablet  Take 1 tablet by mouth daily.        Marland Kitchen olmesartan (BENICAR) 20 MG tablet Take 20 mg by mouth daily.        . Omega-3 Fatty Acids (FISH OIL) 1200 MG CAPS Take 1 capsule by mouth daily.        . vitamin C (ASCORBIC ACID) 500 MG tablet Take 500 mg by mouth daily.         No current facility-administered medications for this visit.    Allergies:   No Known Allergies  Social History:  The patient  reports that she has never smoked. She has never used smokeless tobacco. She reports that  drinks alcohol. She reports that she does not use illicit drugs.   ROS:  Please see the history of present illness.   Her husband states she snores and he has witnessed apneic episodes.  She admits to napping often.   All other systems reviewed and negative.   PHYSICAL EXAM: VS:  BP 160/98  Pulse 72  Ht 5'  2.5" (1.588 m)  Wt 132 lb (59.875 kg)  BMI 23.74 kg/m2 Well nourished, well developed, in no acute distress HEENT: normal Neck: no JVD Cardiac:  normal S1, S2; RRR; no murmur Lungs:  clear to auscultation bilaterally, no wheezing, rhonchi or rales Abd: soft, nontender, no hepatomegaly Ext: no edema; right groin without hematoma or bruit  Skin: warm and dry Neuro:  CNs 2-12 intact, no focal abnormalities noted  EKG  SR 72  ASSESSMENT AND PLAN:  1. Dyspnea:  No CAD on cath.  She does have mild diastolic dysfunction.  Doing well  Rare SOB that is without activity. 2. Hypertension:  She has "white coat" HTN.  She  checks her BP at home.  It is usually 130/80.  Continue current Rx.  I told her to take cuff to [facility] office to make sure it is accurate.  No changes for now.   3. Hyperlipidemia:  Lipids great in June LDL was  4. Carotid Stenosis:  She will f/u with scan  Takes aspirin daily.  Will F/U in clinic in June/July 2015  Try to stay active

## 2013-01-16 NOTE — Patient Instructions (Addendum)
Your physician recommends that you continue on your current medications as directed. Please refer to the Current Medication list given to you today.  Your physician wants you to follow-up in: June/july  You will receive a reminder letter in the mail two months in advance. If you don't receive a letter, please call our office to schedule the follow-up appointment.

## 2013-01-19 DIAGNOSIS — I10 Essential (primary) hypertension: Secondary | ICD-10-CM | POA: Diagnosis not present

## 2013-01-19 DIAGNOSIS — M545 Low back pain: Secondary | ICD-10-CM | POA: Diagnosis not present

## 2013-01-19 DIAGNOSIS — N760 Acute vaginitis: Secondary | ICD-10-CM | POA: Diagnosis not present

## 2013-01-27 DIAGNOSIS — M47817 Spondylosis without myelopathy or radiculopathy, lumbosacral region: Secondary | ICD-10-CM | POA: Diagnosis not present

## 2013-02-06 DIAGNOSIS — M961 Postlaminectomy syndrome, not elsewhere classified: Secondary | ICD-10-CM | POA: Diagnosis not present

## 2013-02-14 DIAGNOSIS — N852 Hypertrophy of uterus: Secondary | ICD-10-CM | POA: Diagnosis not present

## 2013-02-14 DIAGNOSIS — N760 Acute vaginitis: Secondary | ICD-10-CM | POA: Diagnosis not present

## 2013-03-06 DIAGNOSIS — M961 Postlaminectomy syndrome, not elsewhere classified: Secondary | ICD-10-CM | POA: Diagnosis not present

## 2013-03-09 ENCOUNTER — Other Ambulatory Visit: Payer: Self-pay

## 2013-03-14 DIAGNOSIS — L940 Localized scleroderma [morphea]: Secondary | ICD-10-CM | POA: Diagnosis not present

## 2013-04-13 ENCOUNTER — Other Ambulatory Visit: Payer: Self-pay | Admitting: Obstetrics and Gynecology

## 2013-04-13 DIAGNOSIS — N6489 Other specified disorders of breast: Secondary | ICD-10-CM

## 2013-04-14 DIAGNOSIS — M5137 Other intervertebral disc degeneration, lumbosacral region: Secondary | ICD-10-CM | POA: Diagnosis not present

## 2013-05-01 DIAGNOSIS — M961 Postlaminectomy syndrome, not elsewhere classified: Secondary | ICD-10-CM | POA: Diagnosis not present

## 2013-05-01 DIAGNOSIS — M76899 Other specified enthesopathies of unspecified lower limb, excluding foot: Secondary | ICD-10-CM | POA: Diagnosis not present

## 2013-05-12 ENCOUNTER — Encounter: Payer: Self-pay | Admitting: Gastroenterology

## 2013-05-12 ENCOUNTER — Ambulatory Visit (INDEPENDENT_AMBULATORY_CARE_PROVIDER_SITE_OTHER): Payer: Medicare Other | Admitting: Gastroenterology

## 2013-05-12 VITALS — BP 152/84 | HR 76 | Ht 62.5 in | Wt 134.0 lb

## 2013-05-12 DIAGNOSIS — K5289 Other specified noninfective gastroenteritis and colitis: Secondary | ICD-10-CM | POA: Diagnosis not present

## 2013-05-12 DIAGNOSIS — K52831 Collagenous colitis: Secondary | ICD-10-CM

## 2013-05-12 MED ORDER — BUDESONIDE 3 MG PO CP24: 3.0000 mg | ORAL_CAPSULE | Freq: Every day | ORAL | Status: DC

## 2013-05-12 NOTE — Patient Instructions (Signed)
Please follow up in one year   Refill on Entocort sent to your pharmacy

## 2013-05-12 NOTE — Progress Notes (Signed)
History: This patient is a 80 year old Caucasian female who has documented collagenous colitis.  He did not respond initially to amino salicylate therapy but is responding well to her current courses of Entocort, and is currently on 3 mg twice a week which seems to maintain a normal bowel pattern.  She is on calcium supplementation.  She denies other medical problems or issues.  Her appetite is good her weight is stable.  She is up-to-date on her colonoscopy exams.  ROS: All systems were reviewed and are negative unless otherwise stated in the HPI.          Physical Exam: Blood pressure 152/84, pulse 76 and regular and weight 134.  There is no hepatosplenomegaly, abdominal masses or tenderness.  Bowel sounds are normal.  Mental status is normal.   Assessment and Plan: Chronic recurrent collagenous colitis doing well on very low dose Entocort 3 mg twice a week.  She is on calcium supplementation, and I see no contraindication to her continuing this regime which seemed to control her rather debilitated diarrhea in the past.  She will see GI on as-needed basis, otherwise yearly exam with Dr. Hilarie Fredrickson.  She may need a bone density scan in one years time.

## 2013-05-15 ENCOUNTER — Ambulatory Visit
Admission: RE | Admit: 2013-05-15 | Discharge: 2013-05-15 | Disposition: A | Discharge status: Home / Self Care | Payer: Medicare Other | Source: Ambulatory Visit | Attending: Obstetrics and Gynecology | Admitting: Obstetrics and Gynecology

## 2013-05-15 DIAGNOSIS — N6489 Other specified disorders of breast: Secondary | ICD-10-CM

## 2013-05-15 DIAGNOSIS — R928 Other abnormal and inconclusive findings on diagnostic imaging of breast: Secondary | ICD-10-CM | POA: Diagnosis not present

## 2013-05-26 DIAGNOSIS — M47817 Spondylosis without myelopathy or radiculopathy, lumbosacral region: Secondary | ICD-10-CM | POA: Diagnosis not present

## 2013-05-26 DIAGNOSIS — M961 Postlaminectomy syndrome, not elsewhere classified: Secondary | ICD-10-CM | POA: Diagnosis not present

## 2013-06-28 DIAGNOSIS — M2010 Hallux valgus (acquired), unspecified foot: Secondary | ICD-10-CM | POA: Diagnosis not present

## 2013-06-28 DIAGNOSIS — Q66219 Congenital metatarsus primus varus, unspecified foot: Secondary | ICD-10-CM | POA: Diagnosis not present

## 2013-06-28 DIAGNOSIS — M205X9 Other deformities of toe(s) (acquired), unspecified foot: Secondary | ICD-10-CM | POA: Diagnosis not present

## 2013-07-11 DIAGNOSIS — M2040 Other hammer toe(s) (acquired), unspecified foot: Secondary | ICD-10-CM | POA: Diagnosis not present

## 2013-07-11 DIAGNOSIS — Q66219 Congenital metatarsus primus varus, unspecified foot: Secondary | ICD-10-CM | POA: Diagnosis not present

## 2013-07-11 DIAGNOSIS — M24473 Recurrent dislocation, unspecified ankle: Secondary | ICD-10-CM | POA: Diagnosis not present

## 2013-07-11 DIAGNOSIS — M216X9 Other acquired deformities of unspecified foot: Secondary | ICD-10-CM | POA: Diagnosis not present

## 2013-07-11 DIAGNOSIS — G8918 Other acute postprocedural pain: Secondary | ICD-10-CM | POA: Diagnosis not present

## 2013-07-11 DIAGNOSIS — S93129A Dislocation of metatarsophalangeal joint of unspecified toe(s), initial encounter: Secondary | ICD-10-CM | POA: Diagnosis not present

## 2013-07-11 DIAGNOSIS — M2010 Hallux valgus (acquired), unspecified foot: Secondary | ICD-10-CM | POA: Diagnosis not present

## 2013-07-11 DIAGNOSIS — M24476 Recurrent dislocation, unspecified foot: Secondary | ICD-10-CM | POA: Diagnosis not present

## 2013-07-11 DIAGNOSIS — M205X9 Other deformities of toe(s) (acquired), unspecified foot: Secondary | ICD-10-CM | POA: Diagnosis not present

## 2013-07-21 ENCOUNTER — Other Ambulatory Visit: Payer: Self-pay | Admitting: Internal Medicine

## 2013-08-24 DIAGNOSIS — Z4789 Encounter for other orthopedic aftercare: Secondary | ICD-10-CM | POA: Diagnosis not present

## 2013-09-04 ENCOUNTER — Other Ambulatory Visit (HOSPITAL_COMMUNITY): Payer: Self-pay | Admitting: Cardiology

## 2013-09-04 DIAGNOSIS — I6529 Occlusion and stenosis of unspecified carotid artery: Secondary | ICD-10-CM

## 2013-09-14 ENCOUNTER — Encounter (HOSPITAL_COMMUNITY): Payer: Medicare Other

## 2013-09-19 ENCOUNTER — Ambulatory Visit (HOSPITAL_COMMUNITY): Payer: Medicare Other | Attending: Cardiovascular Disease | Admitting: Cardiology

## 2013-09-19 DIAGNOSIS — I6529 Occlusion and stenosis of unspecified carotid artery: Secondary | ICD-10-CM

## 2013-09-19 NOTE — Progress Notes (Signed)
Carotid duplex performed 

## 2013-09-22 DIAGNOSIS — Z4789 Encounter for other orthopedic aftercare: Secondary | ICD-10-CM | POA: Diagnosis not present

## 2013-09-27 ENCOUNTER — Encounter: Payer: Self-pay | Admitting: Internal Medicine

## 2013-09-27 NOTE — Telephone Encounter (Signed)
This encounter was created in error - please disregard.

## 2013-09-27 NOTE — Telephone Encounter (Signed)
New message ° ° ° °Returned Debra's call °

## 2013-10-09 DIAGNOSIS — E039 Hypothyroidism, unspecified: Secondary | ICD-10-CM | POA: Diagnosis not present

## 2013-10-09 DIAGNOSIS — I10 Essential (primary) hypertension: Secondary | ICD-10-CM | POA: Diagnosis not present

## 2013-10-11 DIAGNOSIS — I10 Essential (primary) hypertension: Secondary | ICD-10-CM | POA: Diagnosis not present

## 2013-10-11 DIAGNOSIS — E782 Mixed hyperlipidemia: Secondary | ICD-10-CM | POA: Diagnosis not present

## 2013-10-11 DIAGNOSIS — E039 Hypothyroidism, unspecified: Secondary | ICD-10-CM | POA: Diagnosis not present

## 2013-10-14 ENCOUNTER — Other Ambulatory Visit: Payer: Self-pay | Admitting: Internal Medicine

## 2013-10-18 DIAGNOSIS — H409 Unspecified glaucoma: Secondary | ICD-10-CM | POA: Diagnosis not present

## 2013-10-18 DIAGNOSIS — H401490 Capsular glaucoma with pseudoexfoliation of lens, unspecified eye, stage unspecified: Secondary | ICD-10-CM | POA: Diagnosis not present

## 2013-10-18 DIAGNOSIS — Z961 Presence of intraocular lens: Secondary | ICD-10-CM | POA: Diagnosis not present

## 2013-11-07 DIAGNOSIS — N950 Postmenopausal bleeding: Secondary | ICD-10-CM | POA: Diagnosis not present

## 2013-11-10 DIAGNOSIS — N882 Stricture and stenosis of cervix uteri: Secondary | ICD-10-CM | POA: Diagnosis not present

## 2013-11-10 DIAGNOSIS — N950 Postmenopausal bleeding: Secondary | ICD-10-CM | POA: Diagnosis not present

## 2013-11-14 ENCOUNTER — Other Ambulatory Visit: Payer: Self-pay | Admitting: Obstetrics and Gynecology

## 2013-11-14 DIAGNOSIS — N9489 Other specified conditions associated with female genital organs and menstrual cycle: Secondary | ICD-10-CM

## 2013-11-20 DIAGNOSIS — M47817 Spondylosis without myelopathy or radiculopathy, lumbosacral region: Secondary | ICD-10-CM | POA: Diagnosis not present

## 2013-11-20 DIAGNOSIS — M961 Postlaminectomy syndrome, not elsewhere classified: Secondary | ICD-10-CM | POA: Diagnosis not present

## 2013-11-20 DIAGNOSIS — M76899 Other specified enthesopathies of unspecified lower limb, excluding foot: Secondary | ICD-10-CM | POA: Diagnosis not present

## 2013-11-23 ENCOUNTER — Ambulatory Visit
Admission: RE | Admit: 2013-11-23 | Discharge: 2013-11-23 | Disposition: A | Discharge status: Home / Self Care | Payer: Medicare Other | Source: Ambulatory Visit | Attending: Obstetrics and Gynecology | Admitting: Obstetrics and Gynecology

## 2013-11-23 DIAGNOSIS — N9489 Other specified conditions associated with female genital organs and menstrual cycle: Secondary | ICD-10-CM

## 2013-11-23 DIAGNOSIS — N859 Noninflammatory disorder of uterus, unspecified: Secondary | ICD-10-CM | POA: Diagnosis not present

## 2013-11-23 MED ORDER — GADOBENATE DIMEGLUMINE 529 MG/ML IV SOLN
12.0000 mL | Freq: Once | INTRAVENOUS | Status: AC | PRN
  Administered 2013-11-23: 12 mL via INTRAVENOUS

## 2014-01-22 ENCOUNTER — Other Ambulatory Visit: Payer: Self-pay

## 2014-01-22 DIAGNOSIS — Z1231 Encounter for screening mammogram for malignant neoplasm of breast: Secondary | ICD-10-CM

## 2014-01-31 ENCOUNTER — Other Ambulatory Visit: Payer: Self-pay | Admitting: Internal Medicine

## 2014-02-01 ENCOUNTER — Other Ambulatory Visit: Payer: Self-pay

## 2014-02-08 ENCOUNTER — Ambulatory Visit
Admission: RE | Admit: 2014-02-08 | Discharge: 2014-02-08 | Disposition: A | Discharge status: Home / Self Care | Payer: Medicare Other | Source: Ambulatory Visit

## 2014-02-08 DIAGNOSIS — Z1231 Encounter for screening mammogram for malignant neoplasm of breast: Secondary | ICD-10-CM | POA: Diagnosis not present

## 2014-02-23 DIAGNOSIS — Z23 Encounter for immunization: Secondary | ICD-10-CM | POA: Diagnosis not present

## 2014-03-08 DIAGNOSIS — D259 Leiomyoma of uterus, unspecified: Secondary | ICD-10-CM | POA: Diagnosis not present

## 2014-03-08 DIAGNOSIS — R1907 Generalized intra-abdominal and pelvic swelling, mass and lump: Secondary | ICD-10-CM | POA: Diagnosis not present

## 2014-03-16 ENCOUNTER — Other Ambulatory Visit (HOSPITAL_COMMUNITY): Payer: Self-pay | Admitting: Obstetrics and Gynecology

## 2014-03-16 DIAGNOSIS — N852 Hypertrophy of uterus: Secondary | ICD-10-CM

## 2014-03-16 DIAGNOSIS — R1900 Intra-abdominal and pelvic swelling, mass and lump, unspecified site: Secondary | ICD-10-CM

## 2014-03-21 ENCOUNTER — Ambulatory Visit (HOSPITAL_COMMUNITY)
Admission: RE | Admit: 2014-03-21 | Discharge: 2014-03-21 | Disposition: A | Discharge status: Home / Self Care | Payer: Medicare Other | Source: Ambulatory Visit | Attending: Obstetrics and Gynecology | Admitting: Obstetrics and Gynecology

## 2014-03-21 ENCOUNTER — Other Ambulatory Visit (HOSPITAL_COMMUNITY): Payer: Self-pay | Admitting: Obstetrics and Gynecology

## 2014-03-21 DIAGNOSIS — R1900 Intra-abdominal and pelvic swelling, mass and lump, unspecified site: Secondary | ICD-10-CM

## 2014-03-21 DIAGNOSIS — N8320 Unspecified ovarian cysts: Secondary | ICD-10-CM | POA: Diagnosis not present

## 2014-03-21 DIAGNOSIS — N852 Hypertrophy of uterus: Secondary | ICD-10-CM

## 2014-03-21 DIAGNOSIS — N9489 Other specified conditions associated with female genital organs and menstrual cycle: Secondary | ICD-10-CM | POA: Diagnosis not present

## 2014-03-21 DIAGNOSIS — D259 Leiomyoma of uterus, unspecified: Secondary | ICD-10-CM | POA: Insufficient documentation

## 2014-04-04 DIAGNOSIS — M7071 Other bursitis of hip, right hip: Secondary | ICD-10-CM | POA: Diagnosis not present

## 2014-04-11 DIAGNOSIS — H401431 Capsular glaucoma with pseudoexfoliation of lens, bilateral, mild stage: Secondary | ICD-10-CM | POA: Diagnosis not present

## 2014-04-13 DIAGNOSIS — I10 Essential (primary) hypertension: Secondary | ICD-10-CM | POA: Diagnosis not present

## 2014-04-13 DIAGNOSIS — E119 Type 2 diabetes mellitus without complications: Secondary | ICD-10-CM | POA: Diagnosis not present

## 2014-04-13 DIAGNOSIS — E039 Hypothyroidism, unspecified: Secondary | ICD-10-CM | POA: Diagnosis not present

## 2014-04-20 DIAGNOSIS — I10 Essential (primary) hypertension: Secondary | ICD-10-CM | POA: Diagnosis not present

## 2014-04-20 DIAGNOSIS — I2510 Atherosclerotic heart disease of native coronary artery without angina pectoris: Secondary | ICD-10-CM | POA: Diagnosis not present

## 2014-04-20 DIAGNOSIS — R252 Cramp and spasm: Secondary | ICD-10-CM | POA: Diagnosis not present

## 2014-04-20 DIAGNOSIS — E039 Hypothyroidism, unspecified: Secondary | ICD-10-CM | POA: Diagnosis not present

## 2014-05-22 DIAGNOSIS — Z124 Encounter for screening for malignant neoplasm of cervix: Secondary | ICD-10-CM | POA: Diagnosis not present

## 2014-05-22 DIAGNOSIS — Z01419 Encounter for gynecological examination (general) (routine) without abnormal findings: Secondary | ICD-10-CM | POA: Diagnosis not present

## 2014-05-29 ENCOUNTER — Other Ambulatory Visit: Payer: Self-pay | Admitting: Obstetrics and Gynecology

## 2014-05-29 DIAGNOSIS — Z79890 Hormone replacement therapy (postmenopausal): Secondary | ICD-10-CM

## 2014-05-29 DIAGNOSIS — M8580 Other specified disorders of bone density and structure, unspecified site: Secondary | ICD-10-CM

## 2014-06-04 DIAGNOSIS — E039 Hypothyroidism, unspecified: Secondary | ICD-10-CM | POA: Diagnosis not present

## 2014-06-05 ENCOUNTER — Ambulatory Visit
Admission: RE | Admit: 2014-06-05 | Discharge: 2014-06-05 | Disposition: A | Discharge status: Home / Self Care | Payer: Medicare Other | Source: Ambulatory Visit | Attending: Obstetrics and Gynecology | Admitting: Obstetrics and Gynecology

## 2014-06-05 DIAGNOSIS — Z79890 Hormone replacement therapy (postmenopausal): Secondary | ICD-10-CM

## 2014-06-05 DIAGNOSIS — Z13820 Encounter for screening for osteoporosis: Secondary | ICD-10-CM | POA: Diagnosis not present

## 2014-06-05 DIAGNOSIS — Z780 Asymptomatic menopausal state: Secondary | ICD-10-CM | POA: Diagnosis not present

## 2014-06-05 DIAGNOSIS — M8580 Other specified disorders of bone density and structure, unspecified site: Secondary | ICD-10-CM

## 2014-06-06 NOTE — Progress Notes (Signed)
Date:  06/07/2014   ID:  Carol Wells, DOB May 26, 1931, MRN [id number]  PCP:  Delphina Cahill, MD     HPI: Patient is a 81 yo with a history of SOB  Echo with Gr II diastolic dsyfunction Mild MR   Stres echo suggested ischemia.  Went on to  Cardiac cath in 06/2012:  No CAD  LVEF 65%.  She also has a history of HTN with white coat effect. Since seen she has done well.  She does not complain of SOB  Denies CP  I saw the patinet in clinic in Sept 2014   Wt Readings from Last 3 Encounters:  06/07/14 136 lb 6.4 oz (61.871 kg)  05/12/13 134 lb (60.782 kg)  01/16/13 132 lb (59.875 kg)     Past Medical History  Diagnosis Date  . Chronic headaches   . Diverticulosis 2013    Colonoscopy  . Gastric ulcer   . Glaucoma   . Hyperlipidemia   . Hypertension   . Hypothyroidism   . UTI (urinary tract infection)   . Arthritis   . Blood transfusion   . Cataract     REMOVED  . Collagenous colitis   . Internal hemorrhoids 2009    Colonoscopy  . Hx of echocardiogram     Echocardiogram 06/21/12: Mild LVH, EF 24-40%, grade 2 diastolic dysfunction, mild MR  . Hx of cardiac catheterization     LHC 3/14: no angiographic CAD, EF 65%  . Carotid stenosis     Dopplers 1/02: RICA 72-53%, LICA 6-64%    Current Outpatient Prescriptions  Medication Sig Dispense Refill  . AMBULATORY NON FORMULARY MEDICATION Take 1 tablet by mouth daily. NORETHICIN 2.5    . atorvastatin (LIPITOR) 10 MG tablet TAKE 1 TABLET BY MOUTH DAILY. GENERIC EQUIVALENT FOR LIPITOR 90 tablet 0  . budesonide (ENTOCORT EC) 3 MG 24 hr capsule Take 1 capsule (3 mg total) by mouth daily. 90 capsule 2  . calcium carbonate (OS-CAL) 600 MG TABS Take 600 mg by mouth daily.      . clobetasol ointment (TEMOVATE) 4.03 % Apply 1 application topically daily.    Marland Kitchen estradiol (ESTRACE) 0.5 MG tablet Take 0.5 mg by mouth daily.      Marland Kitchen latanoprost (XALATAN) 0.005 % ophthalmic solution Place 1 drop into both eyes at bedtime.      Marland Kitchen levothyroxine  (SYNTHROID, LEVOTHROID) 125 MCG tablet Take 125 mcg by mouth daily.    . Multiple Vitamin (MULTIVITAMIN) tablet Take 1 tablet by mouth daily.      . norethindrone (AYGESTIN) 5 MG tablet Take 5 mg by mouth daily.    Marland Kitchen olmesartan (BENICAR) 20 MG tablet Take 20 mg by mouth daily.      . Omega-3 Fatty Acids (FISH OIL) 1200 MG CAPS Take 1 capsule by mouth daily.      . vitamin C (ASCORBIC ACID) 500 MG tablet Take 500 mg by mouth daily.       No current facility-administered medications for this visit.    Allergies:   No Known Allergies  Social History:  The patient  reports that she has never smoked. She has never used smokeless tobacco. She reports that she drinks alcohol. She reports that she does not use illicit drugs.   ROS:  Please see the history of present illness.   Her husband states she snores and he has witnessed apneic episodes.  She admits to napping often.   All other systems reviewed and negative.  PHYSICAL EXAM: VS:  BP 178/98 mmHg  Pulse 72  Ht 5' 1.75" (1.568 m)  Wt 136 lb 6.4 oz (61.871 kg)  BMI 25.16 kg/m2  BP 142/86 on my check   Well nourished, well developed, in no acute distress HEENT: normal Neck: no JVD Cardiac:  normal S1, S2; RRR; no murmur Lungs:  clear to auscultation bilaterally, no wheezing, rhonchi or rales Abd: soft, nontender, no hepatomegaly Ext: no edema; right groin without hematoma or bruit  Skin: warm and dry Neuro:  CNs 2-12 intact, no focal abnormalities noted  EKG  SR 72  ASSESSMENT AND PLAN:  1. Dyspnea:  No CAD on cath.  She does have mild diastolic dysfunction.  Doing well  Some SOB when she pushes herself to move faster   2. Hypertension:  Pt reports BP is better at Dr Durene Cal office and at home  On repeat was 142/86  Keep on current regimen but follow closely  Make sure home cuff is calibrated with other cuffs to confirm reliablity   3.  Hyperlipidemia:  Lipids excellent  LDL was 70  I would recomm that she decrease lipitor to 5 mg  F/U  lipid panel in June   3. Carotid   Tortuous on USN  No definite plaquing  No f/u scan planned   Will F/U in  Try to stay active

## 2014-06-07 ENCOUNTER — Ambulatory Visit (INDEPENDENT_AMBULATORY_CARE_PROVIDER_SITE_OTHER): Payer: Medicare Other | Admitting: Internal Medicine

## 2014-06-07 ENCOUNTER — Encounter: Payer: Self-pay | Admitting: Internal Medicine

## 2014-06-07 VITALS — BP 178/98 | HR 72 | Ht 61.75 in | Wt 136.4 lb

## 2014-06-07 DIAGNOSIS — Z6823 Body mass index (BMI) 23.0-23.9, adult: Secondary | ICD-10-CM | POA: Diagnosis not present

## 2014-06-07 DIAGNOSIS — I10 Essential (primary) hypertension: Secondary | ICD-10-CM | POA: Diagnosis not present

## 2014-06-07 DIAGNOSIS — E039 Hypothyroidism, unspecified: Secondary | ICD-10-CM | POA: Diagnosis not present

## 2014-06-07 NOTE — Patient Instructions (Signed)
Your physician recommends that you continue on your current medications as directed. Please refer to the Current Medication list given to you today. Your physician recommends that you have labs (Lipid panel) with results to be faxed to Dr. Harrington Challenger.

## 2014-10-18 ENCOUNTER — Ambulatory Visit: Payer: Medicare Other | Admitting: Internal Medicine

## 2014-10-31 DIAGNOSIS — I10 Essential (primary) hypertension: Secondary | ICD-10-CM | POA: Diagnosis not present

## 2014-10-31 DIAGNOSIS — I2510 Atherosclerotic heart disease of native coronary artery without angina pectoris: Secondary | ICD-10-CM | POA: Diagnosis not present

## 2014-10-31 DIAGNOSIS — E782 Mixed hyperlipidemia: Secondary | ICD-10-CM | POA: Diagnosis not present

## 2014-10-31 DIAGNOSIS — E039 Hypothyroidism, unspecified: Secondary | ICD-10-CM | POA: Diagnosis not present

## 2014-11-01 DIAGNOSIS — H401431 Capsular glaucoma with pseudoexfoliation of lens, bilateral, mild stage: Secondary | ICD-10-CM | POA: Diagnosis not present

## 2014-11-06 DIAGNOSIS — K51819 Other ulcerative colitis with unspecified complications: Secondary | ICD-10-CM | POA: Diagnosis not present

## 2014-11-06 DIAGNOSIS — I10 Essential (primary) hypertension: Secondary | ICD-10-CM | POA: Diagnosis not present

## 2014-11-06 DIAGNOSIS — E039 Hypothyroidism, unspecified: Secondary | ICD-10-CM | POA: Diagnosis not present

## 2014-11-06 DIAGNOSIS — E785 Hyperlipidemia, unspecified: Secondary | ICD-10-CM | POA: Diagnosis not present

## 2014-11-09 ENCOUNTER — Encounter: Payer: Self-pay | Admitting: Internal Medicine

## 2014-11-15 ENCOUNTER — Telehealth: Payer: Self-pay | Admitting: Internal Medicine

## 2014-11-15 NOTE — Telephone Encounter (Signed)
Called patient back with lab results. Per Dr. Harrington Challenger, Lipids are still very good on Lipitor 5 mg , LDL is 81 and I would keep at current dosing. Patient verbalized understanding.

## 2014-11-15 NOTE — Telephone Encounter (Signed)
New message ° ° ° ° ° ° °Returning Pam's call to get lab results °

## 2014-11-27 DIAGNOSIS — N390 Urinary tract infection, site not specified: Secondary | ICD-10-CM | POA: Diagnosis not present

## 2014-12-06 ENCOUNTER — Encounter: Payer: Self-pay | Admitting: Internal Medicine

## 2014-12-06 ENCOUNTER — Ambulatory Visit (INDEPENDENT_AMBULATORY_CARE_PROVIDER_SITE_OTHER): Payer: Medicare Other | Admitting: Internal Medicine

## 2014-12-06 VITALS — BP 126/80 | HR 80 | Ht 62.5 in | Wt 134.1 lb

## 2014-12-06 DIAGNOSIS — K5289 Other specified noninfective gastroenteritis and colitis: Secondary | ICD-10-CM

## 2014-12-06 DIAGNOSIS — K52839 Microscopic colitis, unspecified: Secondary | ICD-10-CM

## 2014-12-06 NOTE — Patient Instructions (Signed)
Please follow up with Dr Pyrtle as needed. 

## 2014-12-06 NOTE — Progress Notes (Signed)
   Subjective:    Patient ID: Carol Wells, female    DOB: [birth year], 81 y.o.   MRN: [id number]  HPI Carol Wells is an 81 year old female with past medical history of collagenous colitis, diverticulosis, hypertension, hyperlipidemia, hypothyroidism who is seen in follow-up. She was previously seen and managed by Dr. Verl Blalock prior to his retirement. This is her first visit with me. She is here today with her husband.  She reports having a long history of chronic diarrhea which could not be diagnosed in [place]. She retired to [facility] and was seen with Dr. Sharlett Iles around 2013. Colonoscopy on 06/09/2011 showed left-sided diverticulosis. No polyps or cancers. Biopsies showed collagenous colitis. She was started on Entocort 9 mg daily with excellent resolution of diarrhea. She has since weaned herself off Entocort very slowly. She weaned down to 3 mg daily and then every other day then every 3 days then twice per week. She finally stopped budesonide entirely in April 2006. Fortunately diarrhea has not returned. Prior to diagnosis she was having 5-8 bowel movements a day including twice at night. She never had abdominal pain nor does she now. She's having 2 soft but formed stools about twice per day. There still some fecal urgency but no diarrhea. Some of her fecal urgency she feels is diet related. No rectal bleeding or melena. Good appetite. No upper GI complaint including no nausea, vomiting, early satiety, dysphagia or odynophagia.  Review of Systems As per history of present illness, otherwise negative  Current Medications, Allergies, Past Medical History, Past Surgical History, Family History and Social History were reviewed in [employer] record.     Objective:   Physical Exam BP 126/80 mmHg  Pulse 80  Ht 5' 2.5" (1.588 m)  Wt 134 lb 2 oz (60.839 kg)  BMI 24.13 kg/m2 Constitutional: Well-developed and well-nourished. No distress. HEENT: Normocephalic  and atraumatic. Oropharynx is clear and moist. No oropharyngeal exudate. Conjunctivae are normal.  No scleral icterus. Neck: Neck supple. Trachea midline. Cardiovascular: Normal rate, regular rhythm and intact distal pulses. No M/R/G Pulmonary/chest: Effort normal and breath sounds normal. No wheezing, rales or rhonchi. Abdominal: Soft, nontender, nondistended. Bowel sounds active throughout. Palpable uterus in the mid abdomen below the umbilicus which is firm Extremities: no clubbing, cyanosis, or edema Lymphadenopathy: No cervical adenopathy noted. Neurological: Alert and oriented to person place and time. Skin: Skin is warm and dry. No rashes noted. Psychiatric: Normal mood and affect. Behavior is normal.     Assessment & Plan:   81 year old female with past medical history of collagenous colitis, diverticulosis, hypertension, hyperlipidemia, hypothyroidism who is seen in follow-up.  1. Collagenous colitis -- in remission off budesonide. We discussed the natural history of collagenous colitis and it can relapse. She is asked to notify me if diarrhea returns. If so we would retreat with budesonide. Hopefully she will have prolonged remission.  2. CRC screening -- no polyps seen at colonoscopy in 2013. She will likely not require further colonoscopy for screening based on age  31. Fibroid uterus -- palpable on exam. Patient states she has known history of fibroids which have been monitored by her gynecologist.  Return on an as-needed basis

## 2015-03-20 DIAGNOSIS — N390 Urinary tract infection, site not specified: Secondary | ICD-10-CM | POA: Diagnosis not present

## 2015-04-09 DIAGNOSIS — N390 Urinary tract infection, site not specified: Secondary | ICD-10-CM | POA: Diagnosis not present

## 2015-04-25 DIAGNOSIS — M7062 Trochanteric bursitis, left hip: Secondary | ICD-10-CM | POA: Diagnosis not present

## 2015-04-25 DIAGNOSIS — M1612 Unilateral primary osteoarthritis, left hip: Secondary | ICD-10-CM | POA: Diagnosis not present

## 2015-05-05 DIAGNOSIS — IMO0001 Reserved for inherently not codable concepts without codable children: Secondary | ICD-10-CM

## 2015-05-05 DIAGNOSIS — Z5189 Encounter for other specified aftercare: Secondary | ICD-10-CM

## 2015-05-05 HISTORY — DX: Reserved for inherently not codable concepts without codable children: IMO0001

## 2015-05-05 HISTORY — DX: Encounter for other specified aftercare: Z51.89

## 2015-05-09 DIAGNOSIS — H401421 Capsular glaucoma with pseudoexfoliation of lens, left eye, mild stage: Secondary | ICD-10-CM | POA: Diagnosis not present

## 2015-05-09 DIAGNOSIS — Z961 Presence of intraocular lens: Secondary | ICD-10-CM | POA: Diagnosis not present

## 2015-05-09 DIAGNOSIS — H26492 Other secondary cataract, left eye: Secondary | ICD-10-CM | POA: Diagnosis not present

## 2015-05-09 DIAGNOSIS — H401411 Capsular glaucoma with pseudoexfoliation of lens, right eye, mild stage: Secondary | ICD-10-CM | POA: Diagnosis not present

## 2015-06-18 DIAGNOSIS — Z124 Encounter for screening for malignant neoplasm of cervix: Secondary | ICD-10-CM | POA: Diagnosis not present

## 2015-06-18 DIAGNOSIS — Z01419 Encounter for gynecological examination (general) (routine) without abnormal findings: Secondary | ICD-10-CM | POA: Diagnosis not present

## 2015-06-19 DIAGNOSIS — D692 Other nonthrombocytopenic purpura: Secondary | ICD-10-CM | POA: Diagnosis not present

## 2015-06-19 DIAGNOSIS — L304 Erythema intertrigo: Secondary | ICD-10-CM | POA: Diagnosis not present

## 2015-07-08 DIAGNOSIS — L304 Erythema intertrigo: Secondary | ICD-10-CM | POA: Diagnosis not present

## 2015-07-08 DIAGNOSIS — L2481 Irritant contact dermatitis due to metals: Secondary | ICD-10-CM | POA: Diagnosis not present

## 2015-10-07 ENCOUNTER — Telehealth: Payer: Self-pay | Admitting: Internal Medicine

## 2015-10-07 NOTE — Telephone Encounter (Signed)
Pt states she has been having urgency and more frequent loose stools. Reports she has been up at night for BM's. Pt thinks she is having a colitis flare. Scheduled pt to see Nicoletta Ba PA tomorrow at 3pm. Pt aware of appt.

## 2015-10-08 ENCOUNTER — Ambulatory Visit (INDEPENDENT_AMBULATORY_CARE_PROVIDER_SITE_OTHER): Payer: Medicare Other | Admitting: Physician Assistant

## 2015-10-08 ENCOUNTER — Encounter: Payer: Self-pay | Admitting: Physician Assistant

## 2015-10-08 VITALS — BP 126/90 | HR 76 | Ht 62.5 in | Wt 136.4 lb

## 2015-10-08 DIAGNOSIS — R197 Diarrhea, unspecified: Secondary | ICD-10-CM

## 2015-10-08 DIAGNOSIS — K52831 Collagenous colitis: Secondary | ICD-10-CM

## 2015-10-08 MED ORDER — BUDESONIDE 3 MG PO CPEP: 3.0000 mg | ORAL_CAPSULE | Freq: Every day | ORAL | Status: DC

## 2015-10-08 MED ORDER — BUDESONIDE 3 MG PO CPEP: ORAL_CAPSULE | ORAL | Status: DC

## 2015-10-08 NOTE — Patient Instructions (Signed)
Entocort directions: Take 3 tablets daily x 30 days If no symptoms, decrease to 6 mg  ( 2 tablets) daily x 30 days Then decrease to 3 mg ( 1 tablet) x 30 days then stop.

## 2015-10-08 NOTE — Progress Notes (Addendum)
Patient ID: Carol Wells, female   DOB: Dec 27, 1931, 82 y.o.   MRN: [id number]   Subjective:    Patient ID: Carol Wells, female    DOB: [birth year], 82 y.o.   MRN: [id number]  HPI  Carol Wells is a pleasant 82 year old white female now followed by Dr. Hilarie Fredrickson has a diagnosis of collagenous colitis. She was diagnosed in 2013 for colonoscopy with Dr. Sharlett Iles. He was also noted at that time to have mild left-sided diverticulosis, no polyps. Patient was last seen in the office in August 2016 and at that time was in remission and had not required any medication for some time. She has responded well to Entocort in the past. She comes in today stating that she's had a gradual increase in symptoms over the past couple of months. She feels that this is gradually progressed but had 2 episodes of urgent diarrhea in the middle of the night over this past weekend. She says generally she is having anywhere from one to 4 diarrheal stools per day which have been watery. She has no complaints of abdominal pain occasionally get some mild cramping, no bleeding. Appetite is been fine and weight has been stable. He has not had any fever. Last antibiotics were in February for UTI. No other changes in medications. She had some Entocort left at home and started heself  back on 3 mg per day over the past several days but so far has not noticed any difference.  Review of Systems Pertinent positive and negative review of systems were noted in the above HPI section.  All other review of systems was otherwise negative.  Outpatient Encounter Prescriptions as of 10/08/2015  Medication Sig  . atorvastatin (LIPITOR) 10 MG tablet TAKE 1 TABLET BY MOUTH DAILY. GENERIC EQUIVALENT FOR LIPITOR (Patient taking differently: TAKE 1/2 TABLET BY MOUTH DAILY. GENERIC EQUIVALENT FOR LIPITOR)  . calcium carbonate (OS-CAL) 600 MG TABS Take 600 mg by mouth daily.    . clobetasol ointment (TEMOVATE) [id number] % Apply 1 application topically daily.  Marland Kitchen estradiol  (ESTRACE) 0.5 MG tablet Take 0.5 mg by mouth daily.    Marland Kitchen latanoprost (XALATAN) 0.005 % ophthalmic solution Place 1 drop into both eyes at bedtime.    Marland Kitchen levothyroxine (SYNTHROID, LEVOTHROID) 125 MCG tablet Take 125 mcg by mouth daily.  . Multiple Vitamin (MULTIVITAMIN) tablet Take 1 tablet by mouth daily.    . norethindrone (AYGESTIN) 5 MG tablet Take 2.5 mg by mouth daily.   Marland Kitchen olmesartan (BENICAR) 20 MG tablet Take 20 mg by mouth daily.    . Omega-3 Fatty Acids (FISH OIL) 1200 MG CAPS Take 1 capsule by mouth daily.    . vitamin C (ASCORBIC ACID) 500 MG tablet Take 500 mg by mouth daily.    . budesonide (ENTOCORT EC) 3 MG 24 hr capsule Take 3 tablets daily.  . [DISCONTINUED] budesonide (ENTOCORT EC) 3 MG 24 hr capsule Take 1 capsule (3 mg total) by mouth daily.   No facility-administered encounter medications on file as of 10/08/2015.   No Known Allergies Patient Active Problem List   Diagnosis Date Noted  . Collagenous colitis 07/16/2011  . Chronic diarrhea 06/09/2011  . History of peptic ulcer disease 05/08/2011  . Personal history of colonic polyps 05/08/2011  . Family history of malignant neoplasm of gastrointestinal tract 05/08/2011  . Other general symptoms  05/08/2011  . Iron deficiency anemia, unspecified  05/08/2011  . Diarrhea 05/08/2011  . Diverticulosis of colon (without mention of hemorrhage) 05/08/2011  Social History   Social History  . Marital Status: Married    Spouse Name: N/A  . Number of Children: 2  . Years of Education: N/A   Occupational History  . Retired Therapist, sports    Social History Main Topics  . Smoking status: Never Smoker   . Smokeless tobacco: Never Used  . Alcohol Use: Yes     Comment: occ  . Drug Use: No  . Sexual Activity: Not on file   Other Topics Concern  . Not on file   Social History Narrative    Ms. Wolfert family history includes Colon cancer in her brother; Colon polyps in her brother.      Objective:    Filed Vitals:    10/08/15 1510  BP: 126/90  Pulse: 76    Physical Exam  well-developed elderly white female in no acute distress, pleasant accompanied by her husband blood pressure 126/90 pulse 76. HEENT; nontraumatic, cephalic EOMI PERRLA sclera anicteric, Cardiovascular; regular rate and rhythm with S1-S2 no murmur or gallop, Pulmonary; clear bilaterally, Abdomen ;soft nontender nondistended bowel sounds are active there is a  Palpable large rounded mass in suprapubic area ( which pt says is a huge fibroid) nontender  Rectal ;exam not done, Extremities ;no clubbing cyanosis or edema skin warm and dry, Neuropsych ;mood and affect appropriate     Assessment & Plan:   #1 82 yo female with collagenous colitis with exacerbation With gradual increase in diarrhea and urgency over the past 2 months. No worrisome signs or symptoms. #2 diverticulosis #3 hypertension #4 hypothyroidism  Plan; restart Entocort 9 mg by mouth every morning 1 month then if symptoms have subsided decrease to 6 mg by mouth every morning 1 month then decrease to 3 mg by mouth every morning 3-4 weeks and if asymptomatic may stop. She is asked to call for advice if she does not have good response to the 9 mg dose over the next couple of weeks. She will follow up with Dr. Hilarie Fredrickson or myself as needed.  Amy S Esterwood PA-C 10/08/2015   Cc: [name], MD  Addendum: Reviewed and agree with initial management. Jerene Bears, MD

## 2015-10-09 ENCOUNTER — Telehealth: Payer: Self-pay | Admitting: Physician Assistant

## 2015-10-09 DIAGNOSIS — Z961 Presence of intraocular lens: Secondary | ICD-10-CM | POA: Diagnosis not present

## 2015-10-09 DIAGNOSIS — H26492 Other secondary cataract, left eye: Secondary | ICD-10-CM | POA: Diagnosis not present

## 2015-10-09 NOTE — Telephone Encounter (Signed)
patient states that her insurance will not cover entorcort prescription that was prescribed by Amy. She states that presription has be to be signed by Dr. Hilarie Fredrickson. She also states that her insurance will not cover for a 90 day supply but will cover for a 60 day supply

## 2015-10-10 ENCOUNTER — Other Ambulatory Visit: Payer: Self-pay | Admitting: *Deleted

## 2015-10-10 NOTE — Telephone Encounter (Signed)
Spoke to the patient and asked her to clarify the medication issue.  She said the insurance company will pay but not for 90 day supply.  Also, the insurance company states that Dr. Hilarie Fredrickson here would have to sign the script , not Amy Esterwood PA.  I told her I would call them and get back to her.  I did call [employer], [phone]. The representative Aaron Edelman looked at this and said for her to get the low price of $ 5.00, she would have to get it for 30 days at a time.  They will override it for one year for the PA to be able to prescribe the prescription .

## 2015-10-10 NOTE — Telephone Encounter (Signed)
Called and advised the patient her $ 5.00 prescription for Entocort is at [facility], [place].

## 2015-10-15 DIAGNOSIS — H26492 Other secondary cataract, left eye: Secondary | ICD-10-CM | POA: Diagnosis not present

## 2015-10-21 DIAGNOSIS — E782 Mixed hyperlipidemia: Secondary | ICD-10-CM | POA: Diagnosis not present

## 2015-10-21 DIAGNOSIS — E039 Hypothyroidism, unspecified: Secondary | ICD-10-CM | POA: Diagnosis not present

## 2015-10-23 DIAGNOSIS — E782 Mixed hyperlipidemia: Secondary | ICD-10-CM | POA: Diagnosis not present

## 2015-10-23 DIAGNOSIS — B372 Candidiasis of skin and nail: Secondary | ICD-10-CM | POA: Diagnosis not present

## 2015-10-23 DIAGNOSIS — E039 Hypothyroidism, unspecified: Secondary | ICD-10-CM | POA: Diagnosis not present

## 2015-10-23 DIAGNOSIS — I10 Essential (primary) hypertension: Secondary | ICD-10-CM | POA: Diagnosis not present

## 2015-11-02 DIAGNOSIS — D649 Anemia, unspecified: Secondary | ICD-10-CM

## 2015-11-02 HISTORY — DX: Anemia, unspecified: D64.9

## 2015-11-22 ENCOUNTER — Emergency Department (HOSPITAL_COMMUNITY): Payer: Medicare Other

## 2015-11-22 ENCOUNTER — Inpatient Hospital Stay (HOSPITAL_COMMUNITY)
Admission: EM | Admit: 2015-11-22 | Discharge: 2015-11-24 | DRG: 378 | Disposition: A | Discharge status: Home / Self Care | Payer: Medicare Other | Attending: Internal Medicine | Admitting: Internal Medicine

## 2015-11-22 ENCOUNTER — Telehealth: Payer: Self-pay | Admitting: Internal Medicine

## 2015-11-22 ENCOUNTER — Encounter (HOSPITAL_COMMUNITY): Payer: Self-pay | Admitting: *Deleted

## 2015-11-22 DIAGNOSIS — D62 Acute posthemorrhagic anemia: Secondary | ICD-10-CM | POA: Diagnosis not present

## 2015-11-22 DIAGNOSIS — E785 Hyperlipidemia, unspecified: Secondary | ICD-10-CM | POA: Diagnosis present

## 2015-11-22 DIAGNOSIS — Z8619 Personal history of other infectious and parasitic diseases: Secondary | ICD-10-CM

## 2015-11-22 DIAGNOSIS — K922 Gastrointestinal hemorrhage, unspecified: Secondary | ICD-10-CM | POA: Insufficient documentation

## 2015-11-22 DIAGNOSIS — Z96641 Presence of right artificial hip joint: Secondary | ICD-10-CM | POA: Diagnosis present

## 2015-11-22 DIAGNOSIS — R5383 Other fatigue: Secondary | ICD-10-CM

## 2015-11-22 DIAGNOSIS — I10 Essential (primary) hypertension: Secondary | ICD-10-CM | POA: Diagnosis present

## 2015-11-22 DIAGNOSIS — D259 Leiomyoma of uterus, unspecified: Secondary | ICD-10-CM | POA: Diagnosis present

## 2015-11-22 DIAGNOSIS — K529 Noninfective gastroenteritis and colitis, unspecified: Secondary | ICD-10-CM | POA: Diagnosis not present

## 2015-11-22 DIAGNOSIS — D509 Iron deficiency anemia, unspecified: Secondary | ICD-10-CM | POA: Diagnosis present

## 2015-11-22 DIAGNOSIS — K921 Melena: Secondary | ICD-10-CM | POA: Diagnosis not present

## 2015-11-22 DIAGNOSIS — K254 Chronic or unspecified gastric ulcer with hemorrhage: Secondary | ICD-10-CM | POA: Diagnosis not present

## 2015-11-22 DIAGNOSIS — K5730 Diverticulosis of large intestine without perforation or abscess without bleeding: Secondary | ICD-10-CM | POA: Diagnosis not present

## 2015-11-22 DIAGNOSIS — H409 Unspecified glaucoma: Secondary | ICD-10-CM | POA: Diagnosis present

## 2015-11-22 DIAGNOSIS — Z800 Family history of malignant neoplasm of digestive organs: Secondary | ICD-10-CM | POA: Diagnosis not present

## 2015-11-22 DIAGNOSIS — R531 Weakness: Secondary | ICD-10-CM

## 2015-11-22 DIAGNOSIS — E039 Hypothyroidism, unspecified: Secondary | ICD-10-CM | POA: Diagnosis present

## 2015-11-22 DIAGNOSIS — K52831 Collagenous colitis: Secondary | ICD-10-CM | POA: Diagnosis present

## 2015-11-22 DIAGNOSIS — Z8711 Personal history of peptic ulcer disease: Secondary | ICD-10-CM | POA: Diagnosis not present

## 2015-11-22 LAB — CBC
HEMATOCRIT: 24.1 % — AB (ref 36.0–46.0)
HEMOGLOBIN: 8.1 g/dL — AB (ref 12.0–15.0)
MCH: 30.2 pg (ref 26.0–34.0)
MCHC: 33.6 g/dL (ref 30.0–36.0)
MCV: 89.9 fL (ref 78.0–100.0)
Platelets: 284 10*3/uL (ref 150–400)
RBC: 2.68 MIL/uL — AB (ref 3.87–5.11)
RDW: 15.1 % (ref 11.5–15.5)
WBC: 10.6 10*3/uL — AB (ref 4.0–10.5)

## 2015-11-22 LAB — COMPREHENSIVE METABOLIC PANEL
ALT: 14 U/L (ref 14–54)
ANION GAP: 5 (ref 5–15)
AST: 21 U/L (ref 15–41)
Albumin: 3.5 g/dL (ref 3.5–5.0)
Alkaline Phosphatase: 33 U/L — ABNORMAL LOW (ref 38–126)
BUN: 34 mg/dL — AB (ref 6–20)
CHLORIDE: 107 mmol/L (ref 101–111)
CO2: 24 mmol/L (ref 22–32)
Calcium: 8.2 mg/dL — ABNORMAL LOW (ref 8.9–10.3)
Creatinine, Ser: 0.9 mg/dL (ref 0.44–1.00)
GFR calc Af Amer: >60 mL/min (ref >60)
GFR, EST NON AFRICAN AMERICAN: 58 mL/min — AB (ref >60)
Glucose, Bld: 100 mg/dL — ABNORMAL HIGH (ref 65–99)
POTASSIUM: 4 mmol/L (ref 3.5–5.1)
Sodium: 136 mmol/L (ref 135–145)
Total Bilirubin: 0.7 mg/dL (ref 0.3–1.2)
Total Protein: 6.4 g/dL — ABNORMAL LOW (ref 6.5–8.1)

## 2015-11-22 LAB — POC OCCULT BLOOD, ED: FECAL OCCULT BLD: NEGATIVE

## 2015-11-22 MED ORDER — LEVOTHYROXINE SODIUM 100 MCG IV SOLR
50.0000 ug | Freq: Every day | INTRAVENOUS | Status: DC
  Administered 2015-11-23 – 2015-11-24 (×2): 50 ug via INTRAVENOUS
  Filled 2015-11-22 (×3): qty 5

## 2015-11-22 MED ORDER — ACETAMINOPHEN 325 MG PO TABS: 650.0000 mg | ORAL_TABLET | Freq: Four times a day (QID) | ORAL | Status: DC | PRN

## 2015-11-22 MED ORDER — SODIUM CHLORIDE 0.9 % IV BOLUS (SEPSIS)
1000.0000 mL | Freq: Once | INTRAVENOUS | Status: AC
  Administered 2015-11-22: 1000 mL via INTRAVENOUS

## 2015-11-22 MED ORDER — SODIUM CHLORIDE 0.9 % IV SOLN: INTRAVENOUS | Status: DC

## 2015-11-22 MED ORDER — FAMOTIDINE IN NACL 20-0.9 MG/50ML-% IV SOLN
20.0000 mg | Freq: Once | INTRAVENOUS | Status: AC
  Administered 2015-11-22: 20 mg via INTRAVENOUS
  Filled 2015-11-22: qty 50

## 2015-11-22 MED ORDER — DIATRIZOATE MEGLUMINE & SODIUM 66-10 % PO SOLN
ORAL | Status: AC
  Filled 2015-11-22: qty 30

## 2015-11-22 MED ORDER — FAMOTIDINE IN NACL 20-0.9 MG/50ML-% IV SOLN
20.0000 mg | INTRAVENOUS | Status: DC
  Filled 2015-11-22 (×2): qty 50

## 2015-11-22 MED ORDER — ACETAMINOPHEN 650 MG RE SUPP: 650.0000 mg | Freq: Four times a day (QID) | RECTAL | Status: DC | PRN

## 2015-11-22 MED ORDER — LATANOPROST 0.005 % OP SOLN
1.0000 [drp] | Freq: Every day | OPHTHALMIC | Status: DC
  Administered 2015-11-22 – 2015-11-23 (×2): 1 [drp] via OPHTHALMIC
  Filled 2015-11-22: qty 2.5

## 2015-11-22 MED ORDER — IOPAMIDOL (ISOVUE-300) INJECTION 61%
100.0000 mL | Freq: Once | INTRAVENOUS | Status: AC | PRN
  Administered 2015-11-22: 100 mL via INTRAVENOUS

## 2015-11-22 NOTE — Telephone Encounter (Signed)
The pt was notified of Dr Celesta Aver recommendation and she will go to the ED for eval, she states she lives in [place] and will go to [facility].

## 2015-11-22 NOTE — Telephone Encounter (Signed)
Per Nicoletta Ba last office visit note:  "Plan; restart Entocort 9 mg by mouth every morning 1 month then if symptoms have subsided decrease to 6 mg by mouth every morning 1 month then decrease to 3 mg by mouth every morning 3-4 weeks and if asymptomatic may stop. She is asked to call for advice if she does not have good response to the 9 mg dose over the next couple of weeks. She will follow up with Dr. Hilarie Fredrickson or myself as needed."  She has been on Entocort since early June took 9 mg for 1 month and then 6 mg since July.  Yesterday cramping, gas and bloating began.  Took Alka seltzer with no relief Wednesday night, had 7-8 black tarry stools over night with profuse sweating and she states her BP "bottomed out" at 88/55.  (retired Marine scientist)  Today 91/60 still feels weak and fatigued.  Has had 2 black tarry stools today.  Took 6 mg entocort last on 11/21/15 7 am.  Please advise Dr Carlean Purl Doc of the day

## 2015-11-22 NOTE — Consult Note (Signed)
Referring Provider: ED Physician/[facility] Primary Care Physician:  Wende Neighbors, MD Primary Gastroenterologist:  Dr. Carlean Purl ([facility])  Date of Admission: 11/22/15 Date of Consultation: 11/22/15  Reason for Consultation:  UGI bleed, significant acute anemia  HPI:  Carol Wells is a 82 y.o. female with a past medical history of collagenous colitis, diverticulosis, gastric ulcer, internal hemorrhoids. She was last seen by her primary GI at [facility] gastroenterology on 10/08/2015 for exacerbation of collagenous colitis. At that time it was noted that she had been in remission for quite some time. Began having increasing diarrhea over the previous month and was restarted on Entocort 9 mg a day for one month, 6 mg 1 month, 3 mg for 3-4 weeks all presuming adequate response to therapy. At the time of her presentation to emergency department she completed her 9 mg course and admitted on 6 mg since this month. The day before she noted gas and bloating at which point she took Alka-Seltzer without relief Wednesday night (2 days ago) and had subsequently 7 to 8 black tarry stools overnight with diaphoresis and hypotension which she measured at home to be 88/75. This morning she noted it remained low at 91/60 with noted weakness and fatigue, 2 additional black tarry stools today. She was advised to proceed to the emergency department for further evaluation.  Last colonoscopy appears to been completed in 2013 with noted mild left-sided diverticulosis, no polyps or cancers, otherwise normal exam. Status post biopsy with surgical pathology noting collagenous colitis. Asked upper endoscopy appears to be dated 04/18/2003 and completed the [facility] [place]. Findings included normal esophagus, prepyloric deformity, entire examined stomach normal, and tired examined duodenum normal. Recommended continue present medications.  Today she states her present situation started Wednesday night (2 days ago)  when she ate hot dogs and beans, became bloated, and took a dose of Copywriter, advertising. Later that night she had diaphoresis and subsequently 7-8 black, tarry stools over the course of the next day. She is a retired Marine scientist and took her blood pressure when she found to be systolic [id number]. This was associated with weakness and fatigue. Denies dizziness, chest pain, dyspnea, syncope, and near syncope. Has had minimal to eat since then ("two bites of casserole last night) and had been trying to stay hydrated with fluids. She does admit a history of PUD x 2 with one occurrence due to H. Pylori. Her last PUD episode was between 2002 and 2004. She has not had bleeding liek this before. Takes "occasional, pretty seldom" ASA for headache, denies all other NSAIDs and ASA powders. Denies overt abdominal pain, N/V, hematochezia, fever. No other upper or lower GI symptoms.  Past Medical History  Diagnosis Date  . Chronic headaches   . Diverticulosis 2013    Colonoscopy  . Gastric ulcer   . Glaucoma   . Hyperlipidemia   . Hypertension   . Hypothyroidism   . UTI (urinary tract infection)   . Arthritis   . Blood transfusion   . Cataract     REMOVED  . Collagenous colitis   . Internal hemorrhoids 2009    Colonoscopy  . Hx of echocardiogram     Echocardiogram 06/21/12: Mild LVH, EF 123456, grade 2 diastolic dysfunction, mild MR  . Hx of cardiac catheterization     LHC 3/14: no angiographic CAD, EF 65%  . Carotid stenosis     Dopplers [id number]: RICA 123456, LICA [id number]    Past Surgical History  Procedure Laterality Date  .  Total hip arthroplasty  2008    Right  . Laminectomy  03/2008  . Cataract extraction w/ intraocular lens implant  03/2010    Left  . Cataract extraction w/ intraocular lens implant  11/2010    Right  . Bunionectomy      Left  . Functional endoscopic sinus surgery    . Hand surgery    . Colonoscopy  2013    NORMAL   . Polypectomy      Prior to Admission medications   Medication Sig  Start Date End Date Taking? Authorizing Provider  atorvastatin (LIPITOR) 10 MG tablet TAKE 1 TABLET BY MOUTH DAILY. GENERIC EQUIVALENT FOR LIPITOR Patient taking differently: TAKE 1/2 TABLET BY MOUTH DAILY. GENERIC EQUIVALENT FOR LIPITOR 02/01/14  Yes Fay Records, MD  budesonide (ENTOCORT EC) 3 MG 24 hr capsule Take 3 tablets daily. Patient taking differently: Take 6 mg by mouth daily. Take 3 tablets daily. 10/08/15  Yes Amy S Esterwood, PA-C  calcium carbonate (OS-CAL) 600 MG TABS Take 600 mg by mouth daily.     Yes Historical Provider, MD  clobetasol ointment (TEMOVATE) AB-123456789 % Apply 1 application topically daily. 06/05/14  Yes Historical Provider, MD  estradiol (ESTRACE) 0.5 MG tablet Take 0.5 mg by mouth daily.     Yes Historical Provider, MD  latanoprost (XALATAN) 0.005 % ophthalmic solution Place 1 drop into both eyes at bedtime.     Yes Historical Provider, MD  levothyroxine (SYNTHROID, LEVOTHROID) 125 MCG tablet Take 125 mcg by mouth daily. 05/03/14  Yes Historical Provider, MD  Multiple Vitamin (MULTIVITAMIN) tablet Take 1 tablet by mouth daily.     Yes Historical Provider, MD  norethindrone (AYGESTIN) 5 MG tablet Take 2.5 mg by mouth daily.  05/06/14  Yes Historical Provider, MD  olmesartan (BENICAR) 20 MG tablet Take 20 mg by mouth daily.     Yes Historical Provider, MD  Omega-3 Fatty Acids (FISH OIL) 1200 MG CAPS Take 1 capsule by mouth daily.     Yes Historical Provider, MD  vitamin C (ASCORBIC ACID) 500 MG tablet Take 500 mg by mouth daily.     Yes Historical Provider, MD    Current Facility-Administered Medications  Medication Dose Route Frequency Provider Last Rate Last Dose  . diatrizoate meglumine-sodium (GASTROGRAFIN) 66-10 % solution           . famotidine (PEPCID) IVPB 20 mg premix  20 mg Intravenous Once Nat Christen, MD 100 mL/hr at 11/22/15 1435 20 mg at 11/22/15 1435   Current Outpatient Prescriptions  Medication Sig Dispense Refill  . atorvastatin (LIPITOR) 10 MG tablet TAKE  1 TABLET BY MOUTH DAILY. GENERIC EQUIVALENT FOR LIPITOR (Patient taking differently: TAKE 1/2 TABLET BY MOUTH DAILY. GENERIC EQUIVALENT FOR LIPITOR) 90 tablet 0  . budesonide (ENTOCORT EC) 3 MG 24 hr capsule Take 3 tablets daily. (Patient taking differently: Take 6 mg by mouth daily. Take 3 tablets daily.) 90 capsule 4  . calcium carbonate (OS-CAL) 600 MG TABS Take 600 mg by mouth daily.      . clobetasol ointment (TEMOVATE) [id number] % Apply 1 application topically daily.    Marland Kitchen estradiol (ESTRACE) 0.5 MG tablet Take 0.5 mg by mouth daily.      Marland Kitchen latanoprost (XALATAN) 0.005 % ophthalmic solution Place 1 drop into both eyes at bedtime.      Marland Kitchen levothyroxine (SYNTHROID, LEVOTHROID) 125 MCG tablet Take 125 mcg by mouth daily.    . Multiple Vitamin (MULTIVITAMIN) tablet Take 1 tablet by mouth  daily.      . norethindrone (AYGESTIN) 5 MG tablet Take 2.5 mg by mouth daily.     Marland Kitchen olmesartan (BENICAR) 20 MG tablet Take 20 mg by mouth daily.      . Omega-3 Fatty Acids (FISH OIL) 1200 MG CAPS Take 1 capsule by mouth daily.      . vitamin C (ASCORBIC ACID) 500 MG tablet Take 500 mg by mouth daily.        Allergies as of 11/22/2015  . (No Known Allergies)    Family History  Problem Relation Age of Onset  . Colon cancer Brother   . Colon polyps Brother     Social History   Social History  . Marital Status: Married    Spouse Name: N/A  . Number of Children: 2  . Years of Education: N/A   Occupational History  . Retired Therapist, sports    Social History Main Topics  . Smoking status: Never Smoker   . Smokeless tobacco: Never Used  . Alcohol Use: Yes     Comment: occ  . Drug Use: No  . Sexual Activity: Not on file   Other Topics Concern  . Not on file   Social History Narrative    Review of Systems: General: Negative for anorexia, weight loss, fever. ENT: Negative for hoarseness, difficulty swallowing. CV: Negative for chest pain, angina, palpitations, peripheral edema.  Respiratory: Negative for  dyspnea at rest, cough, sputum, wheezing.  GI: See history of present illness. Endo: Negative for unusual weight change.  Heme: Negative for bruising or bleeding.  Physical Exam: Vital signs in last 24 hours: Temp:  [98.2 F (36.8 C)] 98.2 F (36.8 C) (07/21 1108) Pulse Rate:  [97] 97 (07/21 1108) BP: (133)/(70) 133/70 mmHg (07/21 1108) SpO2:  [100 %] 100 % (07/21 1108) Weight:  [137 lb (62.143 kg)] 137 lb (62.143 kg) (07/21 1108)   General:   Alert,  Well-developed, well-nourished, pleasant and cooperative in NAD Head:  Normocephalic and atraumatic. Eyes:  Sclera clear, no icterus. Conjunctiva pink. Ears:  Normal auditory acuity. Lungs:  Clear throughout to auscultation. No wheezes, crackles, or rhonchi. No acute distress. Heart:  Regular rate and rhythm; no murmurs, clicks, rubs,  or gallops. Abdomen:  Soft, and nondistended. Mild epigastric TTP noted to minimal palpation. Mid-abdominal mass noted consistent with patient report of "large fibroid" followed by GYN. No hepatosplenomegaly or hernias noted. Normal bowel sounds, without guarding, and without rebound.   Rectal:  Deferred.   Msk:  Symmetrical without gross deformities. Pulses:  Normal bilateral DP pulses noted. Extremities:  Without clubbing or edema. Neurologic:  Alert and  oriented x4; grossly normal neurologically. Psych:  Alert and cooperative. Normal mood and affect.  Intake/Output from previous day:   Intake/Output this shift:    Lab Results:  Recent Labs  11/22/15 1151  WBC 10.6*  HGB 8.1*  HCT 24.1*  PLT 284   BMET  Recent Labs  11/22/15 1151  NA 136  K 4.0  CL 107  CO2 24  GLUCOSE 100*  BUN 34*  CREATININE 0.90  CALCIUM 8.2*   LFT  Recent Labs  11/22/15 1151  PROT 6.4*  ALBUMIN 3.5  AST 21  ALT 14  ALKPHOS 33*  BILITOT 0.7   PT/INR No results for input(s): LABPROT, INR in the last 72 hours. Hepatitis Panel No results for input(s): HEPBSAG, HCVAB, HEPAIGM, HEPBIGM in the last  72 hours. C-Diff No results for input(s): CDIFFTOX in the last 72 hours.  Studies/Results: No results found.  Impression: 82 year old female with a history of PUD, H. Pylori, and collagenous colitis. Recent flare of her colitis with rapid symptomatic improvement after initiation of 3 month titrated course of entocort as noted in HPI. Was doing well until 2 days ago when she had what seems to be melena stools, hypotension, fatigue/weakness and subsequently noted acute anemia with hgb drop of 4+ gm from baseline (13.2 to 8.1). Last black bowel movement this morning. Denies overt abdominal pain but does have epigastric tenderness to palpation. Denies overt GERD symptoms.  Likely UGI bleed with possible sources including esophagitis, gastritis, recurrent PUD, H. Pylori, NSAID effect, duodenitis, duodenal ulcer. She is hemodynamically stable at this point and is pending admission. Will likely need an EGD during admission, possibly this weekend pending clinical progression  Plan: 1. PPI 2. Monitor for recurrent GI bleed 3. Monitor H/H closely 4. Transfuse as necessary 5. Possible EGD, will discuss timing with Dr. Gala Romney (this weekend versus early next week) 6. Avoid all NSAIDs    Walden Field, AGNP-C Adult & Gerontological Nurse Practitioner [facility]      11/22/2015, 2:42 PM

## 2015-11-22 NOTE — ED Notes (Signed)
Pt states she is having black tarry stools. Pt has history of colitis. States she has never had a flair with black stools Pt states she is on Budesonide for a flair.

## 2015-11-22 NOTE — H&P (Signed)
History and Physical    Carol Wells [id number] DOB: [birth year] DOA: 11/22/2015  PCP: Wende Neighbors, MD  Patient coming from: Home  Chief Complaint: Anemia  HPI: Carol Wells is a 82 y.o. female with medical history significant of hyperlipidemia, hypertension, hypothyroidism, collagenous colitis presents emergency department with reported blood in stools associated with increased bloating.  ED Course: In emergency department, patient was noted have a hemoglobin of 8.1. Previous hemoglobin noted be 12.0 on 07/25/2012. Gastroenterology was consulted. Hospitalist service consulted for consideration for admission.  Review of Systems:  Review of Systems  Constitutional: Negative for fever and chills.  HENT: Negative for ear discharge and ear pain.   Eyes: Positive for blurred vision. Negative for pain and discharge.  Respiratory: Negative for shortness of breath and wheezing.   Cardiovascular: Negative for orthopnea and claudication.  Gastrointestinal: Positive for blood in stool and melena. Negative for vomiting.  Genitourinary: Negative for hematuria and flank pain.  Musculoskeletal: Negative for back pain and falls.  Neurological: Positive for loss of consciousness. Negative for tremors.  Psychiatric/Behavioral: Negative for hallucinations and memory loss.     Past Medical History  Diagnosis Date  . Chronic headaches   . Diverticulosis 2013    Colonoscopy  . Gastric ulcer   . Glaucoma   . Hyperlipidemia   . Hypertension   . Hypothyroidism   . UTI (urinary tract infection)   . Arthritis   . Blood transfusion   . Cataract     REMOVED  . Collagenous colitis   . Internal hemorrhoids 2009    Colonoscopy  . Hx of echocardiogram     Echocardiogram 06/21/12: Mild LVH, EF 123456, grade 2 diastolic dysfunction, mild MR  . Hx of cardiac catheterization     LHC 3/14: no angiographic CAD, EF 65%  . Carotid stenosis     Dopplers Q000111Q: RICA 123456, LICA [id number]    Past Surgical  History  Procedure Laterality Date  . Total hip arthroplasty  2008    Right  . Laminectomy  03/2008  . Cataract extraction w/ intraocular lens implant  03/2010    Left  . Cataract extraction w/ intraocular lens implant  11/2010    Right  . Bunionectomy      Left  . Functional endoscopic sinus surgery    . Hand surgery    . Colonoscopy  2013    NORMAL   . Polypectomy       reports that she has never smoked. She has never used smokeless tobacco. She reports that she drinks alcohol. She reports that she does not use illicit drugs.  No Known Allergies  Family History  Problem Relation Age of Onset  . Colon cancer Brother   . Colon polyps Brother     Prior to Admission medications   Medication Sig Start Date End Date Taking? Authorizing Provider  atorvastatin (LIPITOR) 10 MG tablet TAKE 1 TABLET BY MOUTH DAILY. GENERIC EQUIVALENT FOR LIPITOR Patient taking differently: TAKE 1/2 TABLET BY MOUTH DAILY. GENERIC EQUIVALENT FOR LIPITOR 02/01/14  Yes Fay Records, MD  budesonide (ENTOCORT EC) 3 MG 24 hr capsule Take 3 tablets daily. Patient taking differently: Take 6 mg by mouth daily. Take 3 tablets daily. 10/08/15  Yes Amy S Esterwood, PA-C  calcium carbonate (OS-CAL) 600 MG TABS Take 600 mg by mouth daily.     Yes Historical Provider, MD  clobetasol ointment (TEMOVATE) AB-123456789 % Apply 1 application topically daily. 06/05/14  Yes Historical Provider, MD  estradiol (  ESTRACE) 0.5 MG tablet Take 0.5 mg by mouth daily.     Yes Historical Provider, MD  latanoprost (XALATAN) 0.005 % ophthalmic solution Place 1 drop into both eyes at bedtime.     Yes Historical Provider, MD  levothyroxine (SYNTHROID, LEVOTHROID) 125 MCG tablet Take 125 mcg by mouth daily. 05/03/14  Yes Historical Provider, MD  Multiple Vitamin (MULTIVITAMIN) tablet Take 1 tablet by mouth daily.     Yes Historical Provider, MD  norethindrone (AYGESTIN) 5 MG tablet Take 2.5 mg by mouth daily.  05/06/14  Yes Historical Provider, MD    olmesartan (BENICAR) 20 MG tablet Take 20 mg by mouth daily.     Yes Historical Provider, MD  Omega-3 Fatty Acids (FISH OIL) 1200 MG CAPS Take 1 capsule by mouth daily.     Yes Historical Provider, MD  vitamin C (ASCORBIC ACID) 500 MG tablet Take 500 mg by mouth daily.     Yes Historical Provider, MD    Physical Exam: Filed Vitals:   11/22/15 1108  BP: 133/70  Pulse: 97  Temp: 98.2 F (36.8 C)  Height: 5\' 1"  (1.549 m)  Weight: 62.143 kg (137 lb)  SpO2: 100%    Constitutional: NAD, calm, comfortable Filed Vitals:   11/22/15 1108  BP: 133/70  Pulse: 97  Temp: 98.2 F (36.8 C)  Height: 5\' 1"  (1.549 m)  Weight: 62.143 kg (137 lb)  SpO2: 100%   Eyes: PERRL, lids and conjunctivae normal ENMT: Mucous membranes are moist. Posterior pharynx clear of any exudate or lesions.Normal dentition.  Neck: normal, supple, no masses, no thyromegaly Respiratory: clear to auscultation bilaterally, no wheezing, no crackles. Normal respiratory effort. No accessory muscle use.  Cardiovascular: Regular rate and rhythm Abdomen: no tenderness, no masses palpated. No hepatosplenomegaly. Bowel sounds positive.  Musculoskeletal: no clubbing / cyanosis. No joint deformity upper and lower extremities. Good ROM, no contractures. Normal muscle tone.  Skin: no rashes, lesions, ulcers. No induration Neurologic: CN 2-12 grossly intact. Sensation intact, DTR normal. Strength 5/5 in all 4.  Psychiatric: Normal judgment and insight. Alert and oriented x 3. Normal mood.   Labs on Admission: I have personally reviewed following labs and imaging studies  CBC:  Recent Labs Lab 11/22/15 1151  WBC 10.6*  HGB 8.1*  HCT 24.1*  MCV 89.9  PLT XX123456   Basic Metabolic Panel:  Recent Labs Lab 11/22/15 1151  NA 136  K 4.0  CL 107  CO2 24  GLUCOSE 100*  BUN 34*  CREATININE 0.90  CALCIUM 8.2*   GFR: Estimated Creatinine Clearance: 40.7 mL/min (by C-G formula based on Cr of 0.9). Liver Function  Tests:  Recent Labs Lab 11/22/15 1151  AST 21  ALT 14  ALKPHOS 33*  BILITOT 0.7  PROT 6.4*  ALBUMIN 3.5   No results for input(s): LIPASE, AMYLASE in the last 168 hours. No results for input(s): AMMONIA in the last 168 hours. Coagulation Profile: No results for input(s): INR, PROTIME in the last 168 hours. Cardiac Enzymes: No results for input(s): CKTOTAL, CKMB, CKMBINDEX, TROPONINI in the last 168 hours. BNP (last 3 results) No results for input(s): PROBNP in the last 8760 hours. HbA1C: No results for input(s): HGBA1C in the last 72 hours. CBG: No results for input(s): GLUCAP in the last 168 hours. Lipid Profile: No results for input(s): CHOL, HDL, LDLCALC, TRIG, CHOLHDL, LDLDIRECT in the last 72 hours. Thyroid Function Tests: No results for input(s): TSH, T4TOTAL, FREET4, T3FREE, THYROIDAB in the last 72 hours. Anemia Panel:  No results for input(s): VITAMINB12, FOLATE, FERRITIN, TIBC, IRON, RETICCTPCT in the last 72 hours. Urine analysis: No results found for: COLORURINE, APPEARANCEUR, LABSPEC, PHURINE, GLUCOSEU, HGBUR, BILIRUBINUR, KETONESUR, PROTEINUR, UROBILINOGEN, NITRITE, LEUKOCYTESUR Sepsis Labs: !!!!!!!!!!!!!!!!!!!!!!!!!!!!!!!!!!!!!!!!!!!! @LABRCNTIP (procalcitonin:4,lacticidven:4) )No results found for this or any previous visit (from the past 240 hour(s)).   Radiological Exams on Admission: No results found.   Assessment/Plan Principal Problem:   Acute blood loss anemia Active Problems:   History of peptic ulcer disease   Family history of malignant neoplasm of gastrointestinal tract   Iron deficiency anemia, unspecified    Collagenous colitis   Acute blood loss anemia -4 g hemoglobin drop from prior hemoglobin from 2014 noted -Patient presently hemodynamically stable. No indication for transfusion at this time -Continue follow CBC closely. Transfuse as needed -Gastroenterology consulted. We'll follow up on recommendations -For now, admit patient to  medical floor  Hx peptic ulcer disease -We'll continue patient with IV PPI  Hx collagenous colitis -Per above, continue patient on proton pump inhibitor -GI consulted, awaiting recommendations   DVT prophylaxis: SCD's Code Status: full Family Communication: Pt in room, family not at bedside Disposition Plan: Uncertain Consults called: GI Admission status: obs, med-surg   Wacey Zieger, Orpah Melter MD [facility] Pager [phone]  If 7PM-7AM, please contact night-coverage [email or web address] Password [email or web address]  11/22/2015, 2:54 PM

## 2015-11-22 NOTE — ED Provider Notes (Signed)
CSN: [id number]     Arrival date & time 11/22/15  1055 History  By signing my name below, I, Rayna Sexton, attest that this documentation has been prepared under the direction and in the presence of Nat Christen, MD. Electronically Signed: Rayna Sexton, ED Scribe. 11/22/2015. 1:50 PM.   Chief Complaint  Patient presents with  . Blood In Stools   The history is provided by the patient. No language interpreter was used.    HPI Comments: Carol Wells is a 82 y.o. female with a PMHx of diverticulosis, gastric ulcer, collagenous colitis and internal hemmorhoids who presents to the Emergency Department complaining of black/tarry stools x 1 day.  Pt reports 2 days ago after lunch she began feeling very bloated. She took [employer] w/o relief and that night states she began experiencing consistent nocturnal hyperhidrosis and difficulty sleeping with her black/tarry stools beginning the next morning. Pt states that her difficulty sleeping and nocturnal hyperhidrosis has mostly alleviated but reports 2x loose, black/tarry stools this morning. She reports associated, diffuse, abd pain, decreased appetite and a decreased BP (133/70 mmHg). She is currently taking entocort for collagenous colitis. She denies n/v.   Gastroenterologist: Dr. Zenovia Jarred   Past Medical History  Diagnosis Date  . Chronic headaches   . Diverticulosis 2013    Colonoscopy  . Gastric ulcer   . Glaucoma   . Hyperlipidemia   . Hypertension   . Hypothyroidism   . UTI (urinary tract infection)   . Arthritis   . Blood transfusion   . Cataract     REMOVED  . Collagenous colitis   . Internal hemorrhoids 2009    Colonoscopy  . Hx of echocardiogram     Echocardiogram 06/21/12: Mild LVH, EF 123456, grade 2 diastolic dysfunction, mild MR  . Hx of cardiac catheterization     LHC 3/14: no angiographic CAD, EF 65%  . Carotid stenosis     Dopplers [id number]: RICA 123456, LICA [id number]   Past Surgical History  Procedure Laterality Date   . Total hip arthroplasty  2008    Right  . Laminectomy  03/2008  . Cataract extraction w/ intraocular lens implant  03/2010    Left  . Cataract extraction w/ intraocular lens implant  11/2010    Right  . Bunionectomy      Left  . Functional endoscopic sinus surgery    . Hand surgery    . Colonoscopy  2013    NORMAL   . Polypectomy     Family History  Problem Relation Age of Onset  . Colon cancer Brother   . Colon polyps Brother    Social History  Substance Use Topics  . Smoking status: Never Smoker   . Smokeless tobacco: Never Used  . Alcohol Use: Yes     Comment: occ   OB History    No data available     Review of Systems  Constitutional: Positive for diaphoresis and appetite change.  Gastrointestinal: Positive for abdominal pain and blood in stool. Negative for nausea, vomiting and constipation.  Psychiatric/Behavioral: Positive for sleep disturbance.  All other systems reviewed and are negative.  Allergies  Review of patient's allergies indicates no known allergies.  Home Medications   Prior to Admission medications   Medication Sig Start Date End Date Taking? Authorizing Provider  atorvastatin (LIPITOR) 10 MG tablet TAKE 1 TABLET BY MOUTH DAILY. GENERIC EQUIVALENT FOR LIPITOR Patient taking differently: TAKE 1/2 TABLET BY MOUTH DAILY. GENERIC EQUIVALENT FOR LIPITOR 02/01/14  Yes Fay Records, MD  budesonide (ENTOCORT EC) 3 MG 24 hr capsule Take 3 tablets daily. Patient taking differently: Take 6 mg by mouth daily. Take 3 tablets daily. 10/08/15  Yes Amy S Esterwood, PA-C  calcium carbonate (OS-CAL) 600 MG TABS Take 600 mg by mouth daily.     Yes Historical Provider, MD  clobetasol ointment (TEMOVATE) [id number] % Apply 1 application topically daily. 06/05/14  Yes Historical Provider, MD  estradiol (ESTRACE) 0.5 MG tablet Take 0.5 mg by mouth daily.     Yes Historical Provider, MD  latanoprost (XALATAN) 0.005 % ophthalmic solution Place 1 drop into both eyes at bedtime.      Yes Historical Provider, MD  levothyroxine (SYNTHROID, LEVOTHROID) 125 MCG tablet Take 125 mcg by mouth daily. 05/03/14  Yes Historical Provider, MD  Multiple Vitamin (MULTIVITAMIN) tablet Take 1 tablet by mouth daily.     Yes Historical Provider, MD  norethindrone (AYGESTIN) 5 MG tablet Take 2.5 mg by mouth daily.  05/06/14  Yes Historical Provider, MD  olmesartan (BENICAR) 20 MG tablet Take 20 mg by mouth daily.     Yes Historical Provider, MD  Omega-3 Fatty Acids (FISH OIL) 1200 MG CAPS Take 1 capsule by mouth daily.     Yes Historical Provider, MD  vitamin C (ASCORBIC ACID) 500 MG tablet Take 500 mg by mouth daily.     Yes Historical Provider, MD   BP 133/70 mmHg  Pulse 97  Temp(Src) 98.2 F (36.8 C)  Ht 5\' 1"  (1.549 m)  Wt 137 lb (62.143 kg)  BMI 25.90 kg/m2  SpO2 100%    Physical Exam  Constitutional: She is oriented to person, place, and time. She appears well-developed and well-nourished. No distress.  Alert and in NAD  HENT:  Head: Normocephalic and atraumatic.  Eyes: Conjunctivae are normal.  Neck: Neck supple.  Cardiovascular: Normal rate and regular rhythm.   Pulmonary/Chest: Effort normal and breath sounds normal.  Abdominal: Soft. Bowel sounds are normal. There is no tenderness.  Genitourinary:  Chaperone present No masses and heme negative stool  Musculoskeletal: Normal range of motion.  Neurological: She is alert and oriented to person, place, and time.  Skin: Skin is warm and dry.  Psychiatric: She has a normal mood and affect. Her behavior is normal.  Nursing note and vitals reviewed.   ED Course  Procedures  DIAGNOSTIC STUDIES: Oxygen Saturation is 100% on RA, normal by my interpretation.    COORDINATION OF CARE: 1:41 PM Discussed next steps with pt. Pt verbalized understanding and is agreeable with the plan.   Labs Review Labs Reviewed  COMPREHENSIVE METABOLIC PANEL - Abnormal; Notable for the following:    Glucose, Bld 100 (*)    BUN 34 (*)     Calcium 8.2 (*)    Total Protein 6.4 (*)    Alkaline Phosphatase 33 (*)    GFR calc non Af Amer 58 (*)    All other components within normal limits  CBC - Abnormal; Notable for the following:    WBC 10.6 (*)    RBC 2.68 (*)    Hemoglobin 8.1 (*)    HCT 24.1 (*)    All other components within normal limits  POC OCCULT BLOOD, ED  TYPE AND SCREEN    Imaging Review No results found. I have personally reviewed and evaluated these images and lab results as part of my medical decision-making.   EKG Interpretation None     CRITICAL CARE Performed by: Nat Christen Total  critical care time: 30 minutes Critical care time was exclusive of separately billable procedures and treating other patients. Critical care was necessary to treat or prevent imminent or life-threatening deterioration. Critical care was time spent personally by me on the following activities: development of treatment plan with patient and/or surrogate as well as nursing, discussions with consultants, evaluation of patient's response to treatment, examination of patient, obtaining history from patient or surrogate, ordering and performing treatments and interventions, ordering and review of laboratory studies, ordering and review of radiographic studies, pulse oximetry and re-evaluation of patient's condition. MDM   Final diagnoses:  Collagenous colitis   Patient has known history of colitis. Hemoglobin has dropped from 12.0 to 8.1.    Her blood pressure is slightly soft. IV fluids, IV Pepcid, CT scan of abdomen and pelvis, discussed with gastroenterologist. Admit to general medicine.   I personally performed the services described in this documentation, which was scribed in my presence. The recorded information has been reviewed and is accurate.     Nat Christen, MD 11/22/15 1459

## 2015-11-22 NOTE — Telephone Encounter (Signed)
Concern for GI bleeding ED visit and evaluation and Tx is appropriate so ask her to go to ED

## 2015-11-23 ENCOUNTER — Encounter (HOSPITAL_COMMUNITY)
Admission: EM | Disposition: A | Discharge status: Home / Self Care | Payer: Self-pay | Source: Home / Self Care | Attending: Internal Medicine

## 2015-11-23 ENCOUNTER — Encounter (HOSPITAL_COMMUNITY): Payer: Self-pay | Admitting: *Deleted

## 2015-11-23 DIAGNOSIS — H409 Unspecified glaucoma: Secondary | ICD-10-CM | POA: Diagnosis present

## 2015-11-23 DIAGNOSIS — I10 Essential (primary) hypertension: Secondary | ICD-10-CM | POA: Diagnosis present

## 2015-11-23 DIAGNOSIS — Z8711 Personal history of peptic ulcer disease: Secondary | ICD-10-CM | POA: Diagnosis not present

## 2015-11-23 DIAGNOSIS — K259 Gastric ulcer, unspecified as acute or chronic, without hemorrhage or perforation: Secondary | ICD-10-CM | POA: Diagnosis not present

## 2015-11-23 DIAGNOSIS — K921 Melena: Secondary | ICD-10-CM | POA: Diagnosis not present

## 2015-11-23 DIAGNOSIS — D259 Leiomyoma of uterus, unspecified: Secondary | ICD-10-CM | POA: Diagnosis present

## 2015-11-23 DIAGNOSIS — E785 Hyperlipidemia, unspecified: Secondary | ICD-10-CM | POA: Diagnosis present

## 2015-11-23 DIAGNOSIS — Z96641 Presence of right artificial hip joint: Secondary | ICD-10-CM | POA: Diagnosis present

## 2015-11-23 DIAGNOSIS — K52831 Collagenous colitis: Secondary | ICD-10-CM | POA: Diagnosis present

## 2015-11-23 DIAGNOSIS — Z800 Family history of malignant neoplasm of digestive organs: Secondary | ICD-10-CM | POA: Diagnosis not present

## 2015-11-23 DIAGNOSIS — K254 Chronic or unspecified gastric ulcer with hemorrhage: Secondary | ICD-10-CM | POA: Diagnosis not present

## 2015-11-23 DIAGNOSIS — D62 Acute posthemorrhagic anemia: Secondary | ICD-10-CM | POA: Diagnosis not present

## 2015-11-23 DIAGNOSIS — E039 Hypothyroidism, unspecified: Secondary | ICD-10-CM | POA: Diagnosis present

## 2015-11-23 HISTORY — PX: ESOPHAGOGASTRODUODENOSCOPY: SHX5428

## 2015-11-23 LAB — COMPREHENSIVE METABOLIC PANEL
ALBUMIN: 2.8 g/dL — AB (ref 3.5–5.0)
ALT: 15 U/L (ref 14–54)
ANION GAP: 5 (ref 5–15)
AST: 22 U/L (ref 15–41)
Alkaline Phosphatase: 29 U/L — ABNORMAL LOW (ref 38–126)
BUN: 16 mg/dL (ref 6–20)
CHLORIDE: 112 mmol/L — AB (ref 101–111)
CO2: 23 mmol/L (ref 22–32)
Calcium: 7.5 mg/dL — ABNORMAL LOW (ref 8.9–10.3)
Creatinine, Ser: 0.71 mg/dL (ref 0.44–1.00)
GFR calc non Af Amer: >60 mL/min (ref >60)
Glucose, Bld: 88 mg/dL (ref 65–99)
Potassium: 3.8 mmol/L (ref 3.5–5.1)
SODIUM: 140 mmol/L (ref 135–145)
Total Bilirubin: 0.7 mg/dL (ref 0.3–1.2)
Total Protein: 5.4 g/dL — ABNORMAL LOW (ref 6.5–8.1)

## 2015-11-23 LAB — CBC
HEMATOCRIT: 20.3 % — AB (ref 36.0–46.0)
HEMOGLOBIN: 6.7 g/dL — AB (ref 12.0–15.0)
MCH: 30.2 pg (ref 26.0–34.0)
MCHC: 33 g/dL (ref 30.0–36.0)
MCV: 91.4 fL (ref 78.0–100.0)
Platelets: 236 10*3/uL (ref 150–400)
RBC: 2.22 MIL/uL — AB (ref 3.87–5.11)
RDW: 15.2 % (ref 11.5–15.5)
WBC: 5.3 10*3/uL (ref 4.0–10.5)

## 2015-11-23 LAB — PREPARE RBC (CROSSMATCH)

## 2015-11-23 LAB — IRON AND TIBC
Iron: 20 ug/dL — ABNORMAL LOW (ref 28–170)
SATURATION RATIOS: 6 % — AB (ref 10.4–31.8)
TIBC: 335 ug/dL (ref 250–450)
UIBC: 315 ug/dL

## 2015-11-23 LAB — RETICULOCYTES
RBC.: 2.23 MIL/uL — AB (ref 3.87–5.11)
RETIC CT PCT: 3.4 % — AB (ref 0.4–3.1)
Retic Count, Absolute: 75.8 10*3/uL (ref 19.0–186.0)

## 2015-11-23 LAB — FOLATE: Folate: 35.5 ng/mL (ref >5.9)

## 2015-11-23 LAB — ABO/RH: ABO/RH(D): A NEG

## 2015-11-23 LAB — FERRITIN: FERRITIN: 11 ng/mL (ref 11–307)

## 2015-11-23 LAB — VITAMIN B12: Vitamin B-12: 698 pg/mL (ref 180–914)

## 2015-11-23 SURGERY — EGD (ESOPHAGOGASTRODUODENOSCOPY)
Anesthesia: Moderate Sedation

## 2015-11-23 MED ORDER — SODIUM CHLORIDE 0.9 % IV SOLN: Freq: Once | INTRAVENOUS | Status: AC

## 2015-11-23 MED ORDER — ATORVASTATIN CALCIUM 10 MG PO TABS
10.0000 mg | ORAL_TABLET | Freq: Every day | ORAL | Status: DC
  Administered 2015-11-23: 10 mg via ORAL
  Filled 2015-11-23: qty 1

## 2015-11-23 MED ORDER — BUTAMBEN-TETRACAINE-BENZOCAINE 2-2-14 % EX AERO
INHALATION_SPRAY | CUTANEOUS | Status: DC | PRN
  Administered 2015-11-23: 2 via TOPICAL

## 2015-11-23 MED ORDER — BUDESONIDE 3 MG PO CPEP
6.0000 mg | ORAL_CAPSULE | Freq: Every day | ORAL | Status: DC
  Administered 2015-11-23 – 2015-11-24 (×2): 6 mg via ORAL
  Filled 2015-11-23 (×3): qty 2

## 2015-11-23 MED ORDER — MEPERIDINE HCL 50 MG/ML IJ SOLN
INTRAMUSCULAR | Status: AC
  Filled 2015-11-23: qty 1

## 2015-11-23 MED ORDER — PANTOPRAZOLE SODIUM 40 MG IV SOLR
40.0000 mg | Freq: Two times a day (BID) | INTRAVENOUS | Status: AC
  Administered 2015-11-23 – 2015-11-24 (×2): 40 mg via INTRAVENOUS
  Filled 2015-11-23 (×2): qty 40

## 2015-11-23 MED ORDER — MEPERIDINE HCL 50 MG/ML IJ SOLN
INTRAMUSCULAR | Status: DC | PRN
  Administered 2015-11-23: 25 mg via INTRAVENOUS

## 2015-11-23 MED ORDER — MIDAZOLAM HCL 5 MG/5ML IJ SOLN
INTRAMUSCULAR | Status: AC
  Filled 2015-11-23: qty 10

## 2015-11-23 MED ORDER — MIDAZOLAM HCL 5 MG/5ML IJ SOLN
INTRAMUSCULAR | Status: DC | PRN
  Administered 2015-11-23: 2 mg via INTRAVENOUS
  Administered 2015-11-23 (×2): 1 mg via INTRAVENOUS

## 2015-11-23 MED ORDER — SODIUM CHLORIDE 0.9 % IV SOLN
Freq: Once | INTRAVENOUS | Status: AC
Start: 2015-11-23 — End: 2015-11-23
  Administered 2015-11-23: 10:00:00 via INTRAVENOUS

## 2015-11-23 MED ORDER — SUCRALFATE 1 GM/10ML PO SUSP
1.0000 g | Freq: Three times a day (TID) | ORAL | Status: DC
  Administered 2015-11-23 – 2015-11-24 (×4): 1 g via ORAL
  Filled 2015-11-23 (×4): qty 10

## 2015-11-23 MED ORDER — FUROSEMIDE 10 MG/ML IJ SOLN
20.0000 mg | Freq: Once | INTRAMUSCULAR | Status: AC
  Administered 2015-11-23: 20 mg via INTRAVENOUS
  Filled 2015-11-23: qty 2

## 2015-11-23 NOTE — Progress Notes (Signed)
PROGRESS NOTE                                                                                                                                                                                                             Patient Demographics:    Carol Wells, is a 82 y.o. female, DOB - [birth year], [id number]  Admit date - 11/22/2015   Admitting Physician Donne Hazel, MD  Outpatient Primary MD for the patient is Wende Neighbors, MD  LOS - 1  Chief Complaint  Patient presents with  . Blood In Stools       Brief Narrative   Carol Wells is a 82 y.o. female with medical history significant of hyperlipidemia, hypertension, hypothyroidism, collagenous colitis presents emergency department with reported blood in stools associated with increased bloating.  ED Course: In emergency department, patient was noted have a hemoglobin of 8.1. Previous hemoglobin noted be 12.0 on 07/25/2012. Gastroenterology was consulted. Hospitalist service consulted for consideration for admission.   Subjective:    Carol Wells today has, No headache, No chest pain, No abdominal pain - No Nausea, No new weakness tingling or numbness, No Cough - SOB.     Assessment  & Plan :     1. Melena with acute possible upper GI blood loss related anemia. History of collagenous colitis and peptic ulcer disease. He get 2 units of packed RBC on 11/23/2015, GI following due for EGD later today, monitor H&H. Continue IV Pepcid, Entocort for now.  2. Hypothyroidism. On Synthroid.  3. Dyslipidemia. On statin.  4. Essential hypertension. Currently off ARB blood pressure stable.    Family Communication  :  None present  Code Status :  Full  Diet : NPO for EGD  Disposition Plan  :  Home in am if H&H stable  Consults  :  GI  Procedures  :    EGD due  DVT Prophylaxis  :  SCDs    Lab Results  Component Value Date   PLT 236 11/23/2015    Inpatient  Medications  Scheduled Meds: . atorvastatin  10 mg Oral q1800  . budesonide  6 mg Oral Daily  . famotidine (PEPCID) IV  20 mg Intravenous Q24H  . furosemide  20 mg Intravenous Once  . latanoprost  1 drop Both Eyes QHS  . levothyroxine  50  mcg Intravenous Daily   Continuous Infusions: . sodium chloride 75 mL/hr at 11/22/15 1545   PRN Meds:.acetaminophen **OR** [DISCONTINUED] acetaminophen  Antibiotics  :    Anti-infectives    None         Objective:   Filed Vitals:   11/22/15 1630 11/22/15 2217 11/23/15 0455 11/23/15 1012  BP: 130/52 112/57 107/50 138/60  Pulse: 93 89 81 88  Temp: 98.6 F (37 C) 98.5 F (36.9 C) 98.9 F (37.2 C) 98.5 F (36.9 C)  TempSrc: Oral Oral Oral Oral  Resp: 20 20 20 20   Height: 5\' 1"  (1.549 m)     Weight: 61.462 kg (135 lb 8 oz)     SpO2: 100% 100% 99% 100%    Wt Readings from Last 3 Encounters:  11/22/15 61.462 kg (135 lb 8 oz)  10/08/15 61.871 kg (136 lb 6.4 oz)  12/06/14 60.839 kg (134 lb 2 oz)     Intake/Output Summary (Last 24 hours) at 11/23/15 1037 Last data filed at 11/23/15 0800  Gross per 24 hour  Intake      0 ml  Output   1550 ml  Net  -1550 ml     Physical Exam  Awake Alert, Oriented X 3, No new F.N deficits, Normal affect Carol Wells.AT,PERRAL Supple Neck,No JVD, No cervical lymphadenopathy appriciated.  Symmetrical Chest wall movement, Good air movement bilaterally, CTAB RRR,No Gallops,Rubs or new Murmurs, No Parasternal Heave +ve B.Sounds, Abd Soft, No tenderness, No organomegaly appriciated, No rebound - guarding or rigidity. No Cyanosis, Clubbing or edema, No new Rash or bruise       Data Review:    CBC  Recent Labs Lab 11/22/15 1151 11/23/15 0515  WBC 10.6* 5.3  HGB 8.1* 6.7*  HCT 24.1* 20.3*  PLT 284 236  MCV 89.9 91.4  MCH 30.2 30.2  MCHC 33.6 33.0  RDW 15.1 15.2    Chemistries   Recent Labs Lab 11/22/15 1151 11/23/15 0515  NA 136 140  K 4.0 3.8  CL 107 112*  CO2 24 23  GLUCOSE 100* 88   BUN 34* 16  CREATININE 0.90 0.71  CALCIUM 8.2* 7.5*  AST 21 22  ALT 14 15  ALKPHOS 33* 29*  BILITOT 0.7 0.7   ------------------------------------------------------------------------------------------------------------------ No results for input(s): CHOL, HDL, LDLCALC, TRIG, CHOLHDL, LDLDIRECT in the last 72 hours.  No results found for: HGBA1C ------------------------------------------------------------------------------------------------------------------ No results for input(s): TSH, T4TOTAL, T3FREE, THYROIDAB in the last 72 hours.  Invalid input(s): FREET3 ------------------------------------------------------------------------------------------------------------------  Recent Labs  11/23/15 0515  RETICCTPCT 3.4*    Coagulation profile No results for input(s): INR, PROTIME in the last 168 hours.  No results for input(s): DDIMER in the last 72 hours.  Cardiac Enzymes No results for input(s): CKMB, TROPONINI, MYOGLOBIN in the last 168 hours.  Invalid input(s): CK ------------------------------------------------------------------------------------------------------------------ No results found for: BNP  Micro Results No results found for this or any previous visit (from the past 240 hour(s)).  Radiology Reports Ct Abdomen Pelvis W Contrast  11/22/2015  CLINICAL DATA:  Black tarry stools, history of colitis EXAM: CT ABDOMEN AND PELVIS WITH CONTRAST TECHNIQUE: Multidetector CT imaging of the abdomen and pelvis was performed using the standard protocol following bolus administration of intravenous contrast. CONTRAST:  187mL ISOVUE-300 IOPAMIDOL (ISOVUE-300) INJECTION 61% COMPARISON:  None. FINDINGS: Lower chest:  Lung bases are clear. Hepatobiliary: 11 mm cyst in the right hepatic dome (series 7/ image 5). 15 mm cyst in the posterior right hepatic lobe (series 7/ image 9). Additional 8  mm cyst in the posterior segment right hepatic lobe (series 7/image 18). Gallbladder is  unremarkable. No intrahepatic or extrahepatic ductal dilatation. Pancreas: Within normal limits. Spleen: Within normal limits. Adrenals/Urinary Tract: Adrenal glands are within normal limits. 6.5 cm cyst in the left upper kidney (series 7/image 14). No enhancing renal lesions. No hydronephrosis. Bladder is partially obscured by streak artifact but grossly unremarkable. Stomach/Bowel: Stomach is within normal limits. No evidence of bowel obstruction. Normal appendix (series 2/ image 34). Sigmoid diverticulosis, without evidence of diverticulitis. Possible mild wall thickening involving the sigmoid colon (for example, series 2/ image 64), although without convincing inflammatory changes. Vascular/Lymphatic: No evidence of abdominal aortic aneurysm. Atherosclerotic calcifications of the abdominal aorta and branch vessels. No suspicious abdominopelvic lymphadenopathy. Reproductive: Enlarged uterus with large, partially degenerating fibroid in the uterine fundus (series 2/image 59). Although poorly evaluated on CT and partially obscured by streak artifact, the dominant fundal fibroid measures approximately 11.3 x 13.1 x 9.4 cm with a second large fibroid in the lower uterine body (series 2/ image 62). No adnexal masses. Other: No abdominopelvic ascites. Small fat containing left inguinal hernia (series 2/image 75). Musculoskeletal: Status post right hip arthroplasty. Associated bursal fluid on the right (series 2/image 63). IMPRESSION: Possible mild sigmoid colonic wall thickening, although without convincing inflammatory changes. Sigmoid diverticulosis, without evidence of diverticulitis. No evidence of bowel obstruction.  Normal appendix. Large uterine fibroids, measuring up to 13.1 cm, poorly evaluated on CT. Additional ancillary findings as above. Electronically Signed   By: Julian Hy M.D.   On: 11/22/2015 16:28    Time Spent in minutes  30   Chauncey Bruno K M.D on 11/23/2015 at 10:37 AM  Between 7am  to 7pm - Pager - [phone]  After 7pm go to [email or web address] - password [facility]  [facility] -  Office  [phone]

## 2015-11-23 NOTE — Progress Notes (Signed)
CRITICAL VALUE ALERT  Critical value received:  Hg 6.9  Date of notification:  11/23/2015  Time of notification:  0710  Critical value read back:Yes.    Nurse who received alert:  Janace Aris   MD notified (1st page):  Dr. Candiss Norse  Time of first page:  0712  Responding MD:  Dr Candiss Norse  Time MD responded:  0715 New orders change NS @ 50, transfuse 1 unit of packed RBCs, and give 20mg  IV lasix post transfusion.

## 2015-11-24 LAB — TYPE AND SCREEN
ABO/RH(D): A NEG
Antibody Screen: NEGATIVE
UNIT DIVISION: 0
Unit division: 0

## 2015-11-24 LAB — BASIC METABOLIC PANEL
ANION GAP: 4 — AB (ref 5–15)
BUN: 10 mg/dL (ref 6–20)
CALCIUM: 7.3 mg/dL — AB (ref 8.9–10.3)
CO2: 23 mmol/L (ref 22–32)
Chloride: 109 mmol/L (ref 101–111)
Creatinine, Ser: 0.58 mg/dL (ref 0.44–1.00)
GLUCOSE: 90 mg/dL (ref 65–99)
Potassium: 3.5 mmol/L (ref 3.5–5.1)
SODIUM: 136 mmol/L (ref 135–145)

## 2015-11-24 LAB — HEMOGLOBIN AND HEMATOCRIT, BLOOD
HEMATOCRIT: 32.4 % — AB (ref 36.0–46.0)
HEMOGLOBIN: 10.7 g/dL — AB (ref 12.0–15.0)

## 2015-11-24 LAB — CBC
HCT: 29.4 % — ABNORMAL LOW (ref 36.0–46.0)
Hemoglobin: 9.7 g/dL — ABNORMAL LOW (ref 12.0–15.0)
MCH: 30.1 pg (ref 26.0–34.0)
MCHC: 33 g/dL (ref 30.0–36.0)
MCV: 91.3 fL (ref 78.0–100.0)
PLATELETS: 251 10*3/uL (ref 150–400)
RBC: 3.22 MIL/uL — AB (ref 3.87–5.11)
RDW: 15 % (ref 11.5–15.5)
WBC: 6.5 10*3/uL (ref 4.0–10.5)

## 2015-11-24 LAB — CLOTEST (H. PYLORI), BIOPSY: HELICOBACTER SCREEN: NEGATIVE

## 2015-11-24 MED ORDER — LEVOTHYROXINE SODIUM 25 MCG PO TABS: 125.0000 ug | ORAL_TABLET | Freq: Every day | ORAL | Status: DC

## 2015-11-24 MED ORDER — FERROUS SULFATE 325 (65 FE) MG PO TABS
325.0000 mg | ORAL_TABLET | Freq: Three times a day (TID) | ORAL | Status: DC
  Administered 2015-11-24 (×2): 325 mg via ORAL
  Filled 2015-11-24 (×2): qty 1

## 2015-11-24 MED ORDER — PANTOPRAZOLE SODIUM 40 MG PO TBEC: 40.0000 mg | DELAYED_RELEASE_TABLET | Freq: Two times a day (BID) | ORAL | 3 refills | Status: DC

## 2015-11-24 MED ORDER — IRBESARTAN 75 MG PO TABS
37.5000 mg | ORAL_TABLET | Freq: Every day | ORAL | Status: DC
  Administered 2015-11-24: 37.5 mg via ORAL
  Filled 2015-11-24 (×2): qty 1

## 2015-11-24 MED ORDER — CALCIUM CARBONATE 1250 (500 CA) MG PO TABS
500.0000 mg | ORAL_TABLET | Freq: Every day | ORAL | Status: DC
  Administered 2015-11-24: 625 mg via ORAL
  Filled 2015-11-24: qty 1

## 2015-11-24 MED ORDER — OMEGA-3-ACID ETHYL ESTERS 1 G PO CAPS
1.0000 g | ORAL_CAPSULE | Freq: Every day | ORAL | Status: DC
  Administered 2015-11-24: 1 g via ORAL
  Filled 2015-11-24: qty 1

## 2015-11-24 MED ORDER — ADULT MULTIVITAMIN W/MINERALS CH
1.0000 | ORAL_TABLET | Freq: Every day | ORAL | Status: DC
  Administered 2015-11-24: 1 via ORAL
  Filled 2015-11-24: qty 1

## 2015-11-24 MED ORDER — SUCRALFATE 1 GM/10ML PO SUSP: 1.0000 g | Freq: Three times a day (TID) | ORAL | 0 refills | Status: DC

## 2015-11-24 NOTE — Op Note (Signed)
[facility] Patient Name: Carol Wells Procedure Date: 11/23/2015 2:24 PM MRN: [id number] Date of Birth: Nov 07, 1931 Attending MD: Hildred Laser , MD CSN: [id number] Age: 82 Admit Type: Inpatient Procedure:                Upper GI endoscopy Indications:              Melena Providers:                Hildred Laser, MD, Lurline Del, RN, Bonnetta Barry,                            Technician Referring MD:             Orpah Melter. Wyline Copas, MD Medicines:                Cetacaine spray, Meperidine 25 mg IV, Midazolam 4                            mg IV Complications:            No immediate complications. Estimated Blood Loss:     Estimated blood loss was minimal. Procedure:                Pre-Anesthesia Assessment:                           - Prior to the procedure, a History and Physical                            was performed, and patient medications and                            allergies were reviewed. The patient's tolerance of                            previous anesthesia was also reviewed. The risks                            and benefits of the procedure and the sedation                            options and risks were discussed with the patient.                            All questions were answered, and informed consent                            was obtained. Prior Anticoagulants: The patient                            last took aspirin 2 days prior to the procedure.                            ASA Grade Assessment: II - A patient with mild  systemic disease. After reviewing the risks and                            benefits, the patient was deemed in satisfactory                            condition to undergo the procedure.                           After obtaining informed consent, the endoscope was                            passed under direct vision. Throughout the                            procedure, the patient's blood pressure, pulse, and                    oxygen saturations were monitored continuously. The                            Endoscope was introduced through the mouth, and                            advanced to the second part of duodenum. The upper                            GI endoscopy was accomplished without difficulty.                            The patient tolerated the procedure well. Scope In: 2:42:48 PM Scope Out: 2:57:25 PM Total Procedure Duration: 0 hours 14 minutes 37 seconds  Findings:      The examined esophagus was normal.      The Z-line was regular and was found 35 cm from the incisors.      One non-bleeding superficial gastric ulcer with no stigmata of bleeding       was found in the prepyloric region of the stomach. The lesion was 4 mm       in largest dimension.      A 20 mm healed ulcer was found in the prepyloric region of the stomach.      One non-bleeding cratered gastric ulcer with adherent clot was found in       the gastric fundus. The lesion was 6 mm in largest dimension.       Coagulation for bleeding prevention using bipolar probe was successful.      The duodenal bulb and second portion of the duodenum were normal.       Biopsies were taken with a cold forceps for Helicobacter pylori testing       using CLOtest. Impression:               - Normal esophagus.                           - Z-line regular, 35 cm from the incisors.                           -  Non-bleeding gastric ulcer at prepylorus with no                            stigmata of bleeding.                           - Scar in the prepyloric region of the stomach.                           - Non-bleeding gastric ulcer at fundus with                            adherent clot. Treated with bipolar cautery.                           - Normal duodenal bulb and second portion of the                            duodenum. Biopsied.                           comment: Appearence of gastric fundus ulcer also                             raises possibilty of ulcerated leiomyoma. Moderate Sedation:      Moderate (conscious) sedation was administered by the endoscopy nurse       and supervised by the endoscopist. The following parameters were       monitored: oxygen saturation, heart rate, blood pressure, CO2       capnography and response to care. Total physician intraservice time was       21 minutes. Recommendation:           - Return patient to hospital ward for ongoing care.                           - Cardiac diet today.                           - Continue present medications.                           - Use Protonix (pantoprazole) 40 mg IV BID.                           - Use sucralfate suspension 1 gram PO QID.                           - Repeat upper endoscopy in 8 weeks to check                            healing. Procedure Code(s):        --- Professional ---                           [id number], 59, Esophagogastroduodenoscopy, flexible,  transoral; with control of bleeding, any method                           99152, Moderate sedation services provided by the                            same physician or other qualified health care                            professional performing the diagnostic or                            therapeutic service that the sedation supports,                            requiring the presence of an independent trained                            observer to assist in the monitoring of the                            patient's level of consciousness and physiological                            status; initial 15 minutes of intraservice time,                            patient age 1 years or older Diagnosis Code(s):        --- Professional ---                           K25.9, Gastric ulcer, unspecified as acute or                            chronic, without hemorrhage or perforation                           K25.4, Chronic or unspecified gastric ulcer with                             hemorrhage                           K31.89, Other diseases of stomach and duodenum                           K92.1, Melena (includes Hematochezia) CPT copyright 2016 [employer]. All rights reserved. The codes documented in this report are preliminary and upon coder review may  be revised to meet current compliance requirements. Hildred Laser, MD Hildred Laser, MD 11/24/2015 9:57:55 AM This report has been signed electronically. Number of Addenda: 0

## 2015-11-24 NOTE — Progress Notes (Signed)
PROGRESS NOTE                                                                                                                                                                                                             Patient Demographics:    Carol Wells, is a 82 y.o. female, DOB - 04-07-1932, [id number]  Admit date - 11/22/2015   Admitting Physician Donne Hazel, MD  Outpatient Primary MD for the patient is Wende Neighbors, MD  LOS - 2  Chief Complaint  Patient presents with  . Blood In Stools       Brief Narrative   Carol Wells is a 82 y.o. female with medical history significant of hyperlipidemia, hypertension, hypothyroidism, collagenous colitis presents emergency department with reported blood in stools associated with increased bloating.  ED Course: In emergency department, patient was noted have a hemoglobin of 8.1. Previous hemoglobin noted be 12.0 on 07/25/2012. Gastroenterology was consulted. Hospitalist service consulted for consideration for admission.   Subjective:    Carol Wells today has, No headache, No chest pain, No abdominal pain - No Nausea, No new weakness tingling or numbness, No Cough - SOB.     Assessment  & Plan :     1. Melena with acute possible upper GI blood loss related anemia. History of collagenous colitis and peptic ulcer disease. She got 2 units of packed RBC on 11/23/2015, follow-up H&H is stable, underwent EGD on 11/23/2015 by Dr. Laural Golden showing gastric fundus ulceration suspicious for ulcerated Leomyeoma, H. pylori CLOtest pending, continue IV PPI, monitor H&H. Discussed with GI. Keep one more day. For her colitis continue Entocort.  2. Hypothyroidism. On Synthroid.  3. Dyslipidemia. On statin.  4. Essential hypertension. Resume low dose ARB.    Family Communication  :  None present  Code Status :  Full  Diet : Heart Healthy  Disposition Plan  :  Home in am if H&H  stable  Consults  :  GI  Procedures  :    EGD - On 11/23/2015 by Dr. Laural Golden showed an ulcer which was suspicious for ulcerated Leomyeoma.  DVT Prophylaxis  :  SCDs    Lab Results  Component Value Date   PLT 251 11/24/2015    Inpatient Medications  Scheduled Meds: . atorvastatin  10 mg Oral q1800  . budesonide  6 mg Oral Daily  . calcium carbonate  600 mg Oral Daily  . ferrous sulfate  325 mg Oral TID WC  . latanoprost  1 drop Both Eyes QHS  . levothyroxine  125 mcg Oral Daily  . multivitamin  1 tablet Oral Daily  . omega-3 acid ethyl esters  1 g Oral Daily  . sucralfate  1 g Oral TID WC & HS   Continuous Infusions:   PRN Meds:.acetaminophen **OR** [DISCONTINUED] acetaminophen  Antibiotics  :    Anti-infectives    None         Objective:   Vitals:   11/23/15 1645 11/23/15 1844 11/23/15 2108 11/24/15 0535  BP: 127/60 137/70 132/64 (!) 145/66  Pulse: 75 79 71 95  Resp: 18 20 20 20   Temp: 97.7 F (36.5 C) 98.4 F (36.9 C) 98.1 F (36.7 C) 98.2 F (36.8 C)  TempSrc: Oral Oral Oral Oral  SpO2: 100% 100% 99% 97%  Weight:      Height:        Wt Readings from Last 3 Encounters:  11/22/15 61.5 kg (135 lb 8 oz)  10/08/15 61.9 kg (136 lb 6.4 oz)  12/06/14 60.8 kg (134 lb 2 oz)     Intake/Output Summary (Last 24 hours) at 11/24/15 0853 Last data filed at 11/23/15 1844  Gross per 24 hour  Intake              780 ml  Output              600 ml  Net              180 ml     Physical Exam  Awake Alert, Oriented X 3, No new F.N deficits, Normal affect [place].AT,PERRAL Supple Neck,No JVD, No cervical lymphadenopathy appriciated.  Symmetrical Chest wall movement, Good air movement bilaterally, CTAB RRR,No Gallops,Rubs or new Murmurs, No Parasternal Heave +ve B.Sounds, Abd Soft, No tenderness, No organomegaly appriciated, No rebound - guarding or rigidity. No Cyanosis, Clubbing or edema, No new Rash or bruise       Data Review:    CBC  Recent Labs Lab  11/22/15 1151 11/23/15 0515 11/24/15 0542  WBC 10.6* 5.3 6.5  HGB 8.1* 6.7* 9.7*  HCT 24.1* 20.3* 29.4*  PLT 284 236 251  MCV 89.9 91.4 91.3  MCH 30.2 30.2 30.1  MCHC 33.6 33.0 33.0  RDW 15.1 15.2 15.0    Chemistries   Recent Labs Lab 11/22/15 1151 11/23/15 0515 11/24/15 0542  NA 136 140 136  K 4.0 3.8 3.5  CL 107 112* 109  CO2 24 23 23   GLUCOSE 100* 88 90  BUN 34* 16 10  CREATININE 0.90 0.71 0.58  CALCIUM 8.2* 7.5* 7.3*  AST 21 22  --   ALT 14 15  --   ALKPHOS 33* 29*  --   BILITOT 0.7 0.7  --    ------------------------------------------------------------------------------------------------------------------ No results for input(s): CHOL, HDL, LDLCALC, TRIG, CHOLHDL, LDLDIRECT in the last 72 hours.  No results found for: HGBA1C ------------------------------------------------------------------------------------------------------------------ No results for input(s): TSH, T4TOTAL, T3FREE, THYROIDAB in the last 72 hours.  Invalid input(s): FREET3 ------------------------------------------------------------------------------------------------------------------  Recent Labs  11/22/15 1151 11/23/15 0515 11/23/15 0801  VITAMINB12 698  --   --   FOLATE  --   --  35.5  FERRITIN 11  --   --   TIBC 335  --   --   IRON 20*  --   --   RETICCTPCT  --  3.4*  --     Coagulation profile No results for input(s): INR, PROTIME in the last 168 hours.  No results for input(s): DDIMER in the last 72 hours.  Cardiac Enzymes No results for input(s): CKMB, TROPONINI, MYOGLOBIN in the last 168 hours.  Invalid input(s): CK ------------------------------------------------------------------------------------------------------------------ No results found for: BNP  Micro Results No results found for this or any previous visit (from the past 240 hour(s)).  Radiology Reports Ct Abdomen Pelvis W Contrast  Result Date: 11/22/2015 CLINICAL DATA:  Black tarry stools, history  of colitis EXAM: CT ABDOMEN AND PELVIS WITH CONTRAST TECHNIQUE: Multidetector CT imaging of the abdomen and pelvis was performed using the standard protocol following bolus administration of intravenous contrast. CONTRAST:  121mL ISOVUE-300 IOPAMIDOL (ISOVUE-300) INJECTION 61% COMPARISON:  None. FINDINGS: Lower chest:  Lung bases are clear. Hepatobiliary: 11 mm cyst in the right hepatic dome (series 7/ image 5). 15 mm cyst in the posterior right hepatic lobe (series 7/ image 9). Additional 8 mm cyst in the posterior segment right hepatic lobe (series 7/image 18). Gallbladder is unremarkable. No intrahepatic or extrahepatic ductal dilatation. Pancreas: Within normal limits. Spleen: Within normal limits. Adrenals/Urinary Tract: Adrenal glands are within normal limits. 6.5 cm cyst in the left upper kidney (series 7/image 14). No enhancing renal lesions. No hydronephrosis. Bladder is partially obscured by streak artifact but grossly unremarkable. Stomach/Bowel: Stomach is within normal limits. No evidence of bowel obstruction. Normal appendix (series 2/ image 34). Sigmoid diverticulosis, without evidence of diverticulitis. Possible mild wall thickening involving the sigmoid colon (for example, series 2/ image 64), although without convincing inflammatory changes. Vascular/Lymphatic: No evidence of abdominal aortic aneurysm. Atherosclerotic calcifications of the abdominal aorta and branch vessels. No suspicious abdominopelvic lymphadenopathy. Reproductive: Enlarged uterus with large, partially degenerating fibroid in the uterine fundus (series 2/image 59). Although poorly evaluated on CT and partially obscured by streak artifact, the dominant fundal fibroid measures approximately 11.3 x 13.1 x 9.4 cm with a second large fibroid in the lower uterine body (series 2/ image 62). No adnexal masses. Other: No abdominopelvic ascites. Small fat containing left inguinal hernia (series 2/image 75). Musculoskeletal: Status post  right hip arthroplasty. Associated bursal fluid on the right (series 2/image 63). IMPRESSION: Possible mild sigmoid colonic wall thickening, although without convincing inflammatory changes. Sigmoid diverticulosis, without evidence of diverticulitis. No evidence of bowel obstruction.  Normal appendix. Large uterine fibroids, measuring up to 13.1 cm, poorly evaluated on CT. Additional ancillary findings as above. Electronically Signed   By: Julian Hy M.D.   On: 11/22/2015 16:28    Time Spent in minutes  30   SINGH,PRASHANT K M.D on 11/24/2015 at 8:53 AM  Between 7am to 7pm - Pager - [phone]  After 7pm go to [email or web address] - password [email or web address]  [facility] -  Office  [phone]

## 2015-11-24 NOTE — Care Management Note (Deleted)
Case Management Note  Patient Details  Name: Carol Wells MRN: [id number] Date of Birth: 05/21/1931  Subjective/Objective:     Spoke with patient for discharge planning.  Patient is from home and spouse will be helping with care.  She is alert and answers questions appropriately.  No CM needs identified.                 Action/Plan: D/c home self care/spouse  Expected Discharge Date:    11/24/15              Expected Discharge Plan:  Home/Self Care  In-House Referral:     Discharge planning Services  CM Consult  Post Acute Care Choice:    Choice offered to:     DME Arranged:    DME Agency:     HH Arranged:    HH Agency:     Status of Service:     If discussed at [employer], dates discussed:    Additional Comments:  [name], RN 11/24/2015, 1:43 PM

## 2015-11-24 NOTE — Care Management Note (Signed)
Case Management Note  Patient Details  Name: Carol Wells MRN: [id number] Date of Birth: [birth year]  Subjective/Objective:   Spoke with patient for discharge planning.  Patient is from home and spouse will be helping with care.  She is alert and answers questions appropriately.  No CM needs identified                 Action/Plan: D/C home self care  Expected Discharge Date: 11/24/15                 Expected Discharge Plan:  Home/Self Care  In-House Referral:     Discharge planning Services  CM Consult  Post Acute Care Choice:    Choice offered to:     DME Arranged:    DME Agency:     HH Arranged:    [place] Agency:     Status of Service:  Completed, signed off  If discussed at [employer] of [employer] Meetings, dates discussed:    Additional Comments:  Carol Sites, RN 11/24/2015, 1:50 PM Dolphus Jenny

## 2015-11-24 NOTE — Discharge Summary (Signed)
Carol Wells [id number] DOB: November 21, 1931 DOA: 11/22/2015  PCP: Wende Neighbors, MD  Admit date: 11/22/2015  Discharge date: 11/24/2015  Admitted From: Home   Disposition:  Home   Recommendations for Outpatient Follow-up:   Follow up with PCP in 1-2 weeks  PCP Please obtain BMP/CBC, 2 view CXR in 1week,  (see Discharge instructions)   PCP Please follow up on the following pending results: None   Home Health: None   Equipment/Devices: None  Consultations: GI Dr. Laural Golden Discharge Condition: Stable   CODE STATUS: Full   Diet Recommendation: Heart healthy   Chief Complaint  Patient presents with  . Blood In Stools     Brief history of present illness from the day of admission and additional interim summary    Carol Wells is a 82 y.o. female with medical history significant of hyperlipidemia, hypertension, hypothyroidism, collagenous colitis presents emergency department with reported blood in stools associated with increased bloating.  ED Course: In emergency department, patient was noted have a hemoglobin of 8.1. Previous hemoglobin noted be 12.0 on 07/25/2012. Gastroenterology was consulted. Hospitalist service consulted for consideration for admission.  Hospital issues addressed     1. Melena with acute possible upper GI blood loss related anemia. History of collagenous colitis and peptic ulcer disease. She got 2 units of packed RBC on 11/23/2015, follow-up H&H is stable, underwent EGD on 11/23/2015 by Dr. Laural Golden showing gastric fundus ulceration suspicious for ulcerated Leomyeoma, H. pylori CLOtest pending, continue BID PPI, Discussed her case with Dr. Laural Golden twice today, H&H repeat at 1 PM remains stable, will discharge patient home on twice a day PPI and 1 month of Carafate with one-week outpatient GI  follow-up with her primary gastroenterologist, she likely will require repeat EGD within the next few weeks. EGD final report is pending due to Epic upgrade, biopsy results are pending as well.  For her colitis continue Entocort.  2. Hypothyroidism. On Synthroid.  3. Dyslipidemia. On statin.  4. Essential hypertension. Resume low dose ARB.  5. Incidental finding of large uterine fibroid. Age-appropriate outpatient PCP follow-up.    Discharge diagnosis     Principal Problem:   Acute blood loss anemia Active Problems:   History of peptic ulcer disease   Family history of malignant neoplasm of gastrointestinal tract   Iron deficiency anemia, unspecified    Collagenous colitis   Colitis    Discharge instructions    Discharge Instructions    Diet - low sodium heart healthy    Complete by:  As directed   Discharge instructions    Complete by:  As directed   Follow with Primary MD Wende Neighbors, MD in 3-4 days   Get CBC, CMP, 2 view Chest X ray checked  by Primary MD or SNF MD in 5-7 days ( we routinely change or add medications that can affect your baseline labs and fluid status, therefore we recommend that you get the mentioned basic workup next visit with your PCP, your PCP may decide not to get them or  add new tests based on their clinical decision)   Activity: As tolerated with Full fall precautions use walker/cane & assistance as needed   Disposition Home     Diet:   Heart Healthy    For Heart failure patients - Check your Weight same time everyday, if you gain over 2 pounds, or you develop in leg swelling, experience more shortness of breath or chest pain, call your Primary MD immediately. Follow Cardiac Low Salt Diet and 1.5 lit/day fluid restriction.   On your next visit with your primary care physician please Get Medicines reviewed and adjusted.   Please request your Prim.MD to go over all Hospital Tests and Procedure/Radiological results at the follow up, please  get all Hospital records sent to your Prim MD by signing hospital release before you go home.   If you experience worsening of your admission symptoms, develop shortness of breath, life threatening emergency, suicidal or homicidal thoughts you must seek medical attention immediately by calling 911 or calling your MD immediately  if symptoms less severe.  You Must read complete instructions/literature along with all the possible adverse reactions/side effects for all the Medicines you take and that have been prescribed to you. Take any new Medicines after you have completely understood and accpet all the possible adverse reactions/side effects.   Do not drive, operate heavy machinery, perform activities at heights, swimming or participation in water activities or provide baby sitting services if your were admitted for syncope or siezures until you have seen by Primary MD or a Neurologist and advised to do so again.  Do not drive when taking Pain medications.    Do not take more than prescribed Pain, Sleep and Anxiety Medications  Special Instructions: If you have smoked or chewed Tobacco  in the last 2 yrs please stop smoking, stop any regular Alcohol  and or any Recreational drug use.  Wear Seat belts while driving.   Please note  You were cared for by a hospitalist during your hospital stay. If you have any questions about your discharge medications or the care you received while you were in the hospital after you are discharged, you can call the unit and asked to speak with the hospitalist on call if the hospitalist that took care of you is not available. Once you are discharged, your primary care physician will handle any further medical issues. Please note that NO REFILLS for any discharge medications will be authorized once you are discharged, as it is imperative that you return to your primary care physician (or establish a relationship with a primary care physician if you do not have one)  for your aftercare needs so that they can reassess your need for medications and monitor your lab values.   Increase activity slowly    Complete by:  As directed      Discharge Medications     Medication List    TAKE these medications   atorvastatin 10 MG tablet Commonly known as:  LIPITOR TAKE 1 TABLET BY MOUTH DAILY. GENERIC EQUIVALENT FOR LIPITOR What changed:  See the new instructions.   budesonide 3 MG 24 hr capsule Commonly known as:  ENTOCORT EC Take 3 tablets daily. What changed:  how much to take  how to take this  when to take this  additional instructions   calcium carbonate 600 MG Tabs tablet Commonly known as:  OS-CAL Take 600 mg by mouth daily.   clobetasol ointment 0.05 % Commonly known as:  TEMOVATE Apply 1  application topically daily.   estradiol 0.5 MG tablet Commonly known as:  ESTRACE Take 0.5 mg by mouth daily.   Fish Oil 1200 MG Caps Take 1 capsule by mouth daily.   latanoprost 0.005 % ophthalmic solution Commonly known as:  XALATAN Place 1 drop into both eyes at bedtime.   levothyroxine 125 MCG tablet Commonly known as:  SYNTHROID, LEVOTHROID Take 125 mcg by mouth daily.   multivitamin tablet Take 1 tablet by mouth daily.   norethindrone 5 MG tablet Commonly known as:  AYGESTIN Take 2.5 mg by mouth daily.   olmesartan 20 MG tablet Commonly known as:  BENICAR Take 20 mg by mouth daily.   pantoprazole 40 MG tablet Commonly known as:  PROTONIX Take 1 tablet (40 mg total) by mouth 2 (two) times daily before a meal. Switch for any other PPI at similar dose and frequency   sucralfate 1 GM/10ML suspension Commonly known as:  CARAFATE Take 10 mLs (1 g total) by mouth 4 (four) times daily -  with meals and at bedtime.   vitamin C 500 MG tablet Commonly known as:  ASCORBIC ACID Take 500 mg by mouth daily.       No Known Allergies  Follow-up Information    Wende Neighbors, MD. Schedule an appointment as soon as possible for a  visit in 3 day(s).   Specialty:  Internal Medicine Contact information: [place] [address] [phone]        Jerene Bears, MD. Schedule an appointment as soon as possible for a visit in 1 week(s).   Specialty:  Gastroenterology Contact information: [address] [address] [phone]           Major procedures and Radiology Reports - PLEASE review detailed and final reports thoroughly  -     EGD - On 11/23/2015 by Dr. Laural Golden showed an ulcer which was suspicious for ulcerated Leomyeoma.     Ct Abdomen Pelvis W Contrast  Result Date: 11/22/2015 CLINICAL DATA:  Black tarry stools, history of colitis EXAM: CT ABDOMEN AND PELVIS WITH CONTRAST TECHNIQUE: Multidetector CT imaging of the abdomen and pelvis was performed using the standard protocol following bolus administration of intravenous contrast. CONTRAST:  118mL ISOVUE-300 IOPAMIDOL (ISOVUE-300) INJECTION 61% COMPARISON:  None. FINDINGS: Lower chest:  Lung bases are clear. Hepatobiliary: 11 mm cyst in the right hepatic dome (series 7/ image 5). 15 mm cyst in the posterior right hepatic lobe (series 7/ image 9). Additional 8 mm cyst in the posterior segment right hepatic lobe (series 7/image 18). Gallbladder is unremarkable. No intrahepatic or extrahepatic ductal dilatation. Pancreas: Within normal limits. Spleen: Within normal limits. Adrenals/Urinary Tract: Adrenal glands are within normal limits. 6.5 cm cyst in the left upper kidney (series 7/image 14). No enhancing renal lesions. No hydronephrosis. Bladder is partially obscured by streak artifact but grossly unremarkable. Stomach/Bowel: Stomach is within normal limits. No evidence of bowel obstruction. Normal appendix (series 2/ image 34). Sigmoid diverticulosis, without evidence of diverticulitis. Possible mild wall thickening involving the sigmoid colon (for example, series 2/ image 64), although without convincing inflammatory changes.  Vascular/Lymphatic: No evidence of abdominal aortic aneurysm. Atherosclerotic calcifications of the abdominal aorta and branch vessels. No suspicious abdominopelvic lymphadenopathy. Reproductive: Enlarged uterus with large, partially degenerating fibroid in the uterine fundus (series 2/image 59). Although poorly evaluated on CT and partially obscured by streak artifact, the dominant fundal fibroid measures approximately 11.3 x 13.1 x 9.4 cm with a second large fibroid in  the lower uterine body (series 2/ image 62). No adnexal masses. Other: No abdominopelvic ascites. Small fat containing left inguinal hernia (series 2/image 75). Musculoskeletal: Status post right hip arthroplasty. Associated bursal fluid on the right (series 2/image 63). IMPRESSION: Possible mild sigmoid colonic wall thickening, although without convincing inflammatory changes. Sigmoid diverticulosis, without evidence of diverticulitis. No evidence of bowel obstruction.  Normal appendix. Large uterine fibroids, measuring up to 13.1 cm, poorly evaluated on CT. Additional ancillary findings as above. Electronically Signed   By: Julian Hy M.D.   On: 11/22/2015 16:28    Micro Results     No results found for this or any previous visit (from the past 240 hour(s)).  Today   Subjective    Giahna Dina today has no headache,no chest abdominal pain,no new weakness tingling or numbness, feels much better wants to go home today.     Objective   Blood pressure (!) 145/66, pulse 95, temperature 98.2 F (36.8 C), temperature source Oral, resp. rate 20, height 5\' 1"  (1.549 m), weight 61.5 kg (135 lb 8 oz), SpO2 97 %.   Intake/Output Summary (Last 24 hours) at 11/24/15 1349 Last data filed at 11/23/15 1844  Gross per 24 hour  Intake              450 ml  Output              600 ml  Net             -150 ml    Exam Awake Alert, Oriented x 3, No new F.N deficits, Normal affect [place].AT,PERRAL Supple Neck,No JVD, No cervical  lymphadenopathy appriciated.  Symmetrical Chest wall movement, Good air movement bilaterally, CTAB RRR,No Gallops,Rubs or new Murmurs, No Parasternal Heave +ve B.Sounds, Abd Soft, Non tender, No organomegaly appriciated, No rebound -guarding or rigidity. No Cyanosis, Clubbing or edema, No new Rash or bruise   Data Review   CBC w Diff:  Lab Results  Component Value Date   WBC 6.5 11/24/2015   HGB 10.7 (L) 11/24/2015   HCT 32.4 (L) 11/24/2015   PLT 251 11/24/2015   LYMPHOPCT 20.7 07/25/2012   MONOPCT 4.6 07/25/2012   EOSPCT 1.6 07/25/2012   BASOPCT 0.2 07/25/2012    CMP:  Lab Results  Component Value Date   NA 136 11/24/2015   K 3.5 11/24/2015   CL 109 11/24/2015   CO2 23 11/24/2015   BUN 10 11/24/2015   CREATININE 0.58 11/24/2015   PROT 5.4 (L) 11/23/2015   ALBUMIN 2.8 (L) 11/23/2015   BILITOT 0.7 11/23/2015   ALKPHOS 29 (L) 11/23/2015   AST 22 11/23/2015   ALT 15 11/23/2015  .   Total Time in preparing paper work, data evaluation and todays exam - 35 minutes  Thurnell Lose M.D on 11/24/2015 at 1:49 PM  [facility]   Office  [phone]

## 2015-11-24 NOTE — Progress Notes (Deleted)
  Subjective:  Patient has no complaints. She denies chest pain or shortness of breath. She also denies abdominal pain or melena. She states she ate most of her breakfast.   Objective: Blood pressure (!) 145/66, pulse 95, temperature 98.2 F (36.8 C), temperature source Oral, resp. rate 20, height 5\' 1"  (1.549 m), weight 135 lb 8 oz (61.5 kg), SpO2 97 %. Patient is alert and in no acute distress. Abdomen is soft and nontender. No LE edema or clubbing noted.  Labs/studies Results:   Recent Labs  11/22/15 1151 11/23/15 0515 11/24/15 0542  WBC 10.6* 5.3 6.5  HGB 8.1* 6.7* 9.7*  HCT 24.1* 20.3* 29.4*  PLT 284 236 251    BMET   Recent Labs  11/22/15 1151 11/23/15 0515 11/24/15 0542  NA 136 140 136  K 4.0 3.8 3.5  CL 107 112* 109  CO2 24 23 23   GLUCOSE 100* 88 90  BUN 34* 16 10  CREATININE 0.90 0.71 0.58  CALCIUM 8.2* 7.5* 7.3*    LFT   Recent Labs  11/22/15 1151 11/23/15 0515  PROT 6.4* 5.4*  ALBUMIN 3.5 2.8*  AST 21 22  ALT 14 15  ALKPHOS 33* 29*  BILITOT 0.7 0.7    Given capsule study results: Please see separate results. Patient has duodenal and jejunal AV malformations with stigmata of bleed.  Assessment:  #1. Recurrent GI bleed secondary to duodenal and jejunal AV malformations well documented on small bowel given capsule study. These lesions may be within reach with push enteroscopy.  Findings reviewed with patient and her daughter-in-law who is at bedside(son Charlotte Crumb is also on the floor at extremely nervous and unable to participate in conversation). #2. Anemia secondary to recurrent GI bleed. Patient has received 2 units of PRBCs. Hemoglobin is stable. #3. history of CVA in May 2017 #4. history of pulmonary embolism. Anticoagulant on hold.   Recommendations:  DC by mouth iron for now. Push enteroscopy on 11/25/2015 under monitored anesthesia care. CBC in a.m.

## 2015-11-24 NOTE — Progress Notes (Signed)
  Subjective:  Patient has no complaints. She feels much better. She's been ambulating in the room without postural symptoms. She denies abdominal pain. Her appetite is good. She had small bowel movement this morning and stool was black.   Objective: Blood pressure (!) 145/66, pulse 95, temperature 98.2 F (36.8 C), temperature source Oral, resp. rate 20, height 5\' 1"  (1.549 m), weight 135 lb 8 oz (61.5 kg), SpO2 97 %. Patient is alert and in no acute distress. Abdominal exam reveals very large lower abdominal mass second 2 no known uterine fibroids. Abdomen is otherwise soft and without epigastric tenderness. No LE edema or clubbing noted.  Labs/studies Results:   Recent Labs  11/22/15 1151 11/23/15 0515 11/24/15 0542  WBC 10.6* 5.3 6.5  HGB 8.1* 6.7* 9.7*  HCT 24.1* 20.3* 29.4*  PLT 284 236 251    BMET   Recent Labs  11/22/15 1151 11/23/15 0515 11/24/15 0542  NA 136 140 136  K 4.0 3.8 3.5  CL 107 112* 109  CO2 24 23 23   GLUCOSE 100* 88 90  BUN 34* 16 10  CREATININE 0.90 0.71 0.58  CALCIUM 8.2* 7.5* 7.3*    LFT   Recent Labs  11/22/15 1151 11/23/15 0515  PROT 6.4* 5.4*  ALBUMIN 3.5 2.8*  AST 21 22  ALT 14 15  ALKPHOS 33* 29*  BILITOT 0.7 0.7    Clo test pending Assessment:  #1. Upper GI bleed secondary to fundal gastric ulcer. Endoscopic appearance suspicious for ulcerated leiomyoma. Status post therapeutic EGD yesterday. No evidence of active GI bleed. Patient tolerating heart healthy diet. #2. Anemia secondary to acute GI bleed. Patient has received 2 units of PRBCs. As discussed with Dr. Candiss Norse patient will have another H&H this afternoon but stable she can go home.  Recommendations:  No aspirin or NSAIDs. Continue pantoprazole at 40 mg by mouth twice a day until next EGD in 8 weeks. Sucralfate 1 g by mouth before meals and daily at bedtime for 1 month. I will contact patient with results of CLOtest when completed.

## 2015-11-24 NOTE — Progress Notes (Signed)
Patient with orders to be discharge home. Discharge instructions given, patient verbalized understanding. Patient stable. Patient left in private vehicle with spouse.  

## 2015-11-24 NOTE — Discharge Instructions (Signed)
Follow with Primary MD Wende Neighbors, MD in 3-4 days   Get CBC, CMP, 2 view Chest X ray checked  by Primary MD or SNF MD in 5-7 days ( we routinely change or add medications that can affect your baseline labs and fluid status, therefore we recommend that you get the mentioned basic workup next visit with your PCP, your PCP may decide not to get them or add new tests based on their clinical decision)   Activity: As tolerated with Full fall precautions use walker/cane & assistance as needed   Disposition Home     Diet:   Heart Healthy    For Heart failure patients - Check your Weight same time everyday, if you gain over 2 pounds, or you develop in leg swelling, experience more shortness of breath or chest pain, call your Primary MD immediately. Follow Cardiac Low Salt Diet and 1.5 lit/day fluid restriction.   On your next visit with your primary care physician please Get Medicines reviewed and adjusted.   Please request your Prim.MD to go over all Hospital Tests and Procedure/Radiological results at the follow up, please get all Hospital records sent to your Prim MD by signing hospital release before you go home.   If you experience worsening of your admission symptoms, develop shortness of breath, life threatening emergency, suicidal or homicidal thoughts you must seek medical attention immediately by calling 911 or calling your MD immediately  if symptoms less severe.  You Must read complete instructions/literature along with all the possible adverse reactions/side effects for all the Medicines you take and that have been prescribed to you. Take any new Medicines after you have completely understood and accpet all the possible adverse reactions/side effects.   Do not drive, operate heavy machinery, perform activities at heights, swimming or participation in water activities or provide baby sitting services if your were admitted for syncope or siezures until you have seen by Primary MD or a  Neurologist and advised to do so again.  Do not drive when taking Pain medications.    Do not take more than prescribed Pain, Sleep and Anxiety Medications  Special Instructions: If you have smoked or chewed Tobacco  in the last 2 yrs please stop smoking, stop any regular Alcohol  and or any Recreational drug use.  Wear Seat belts while driving.   Please note  You were cared for by a hospitalist during your hospital stay. If you have any questions about your discharge medications or the care you received while you were in the hospital after you are discharged, you can call the unit and asked to speak with the hospitalist on call if the hospitalist that took care of you is not available. Once you are discharged, your primary care physician will handle any further medical issues. Please note that NO REFILLS for any discharge medications will be authorized once you are discharged, as it is imperative that you return to your primary care physician (or establish a relationship with a primary care physician if you do not have one) for your aftercare needs so that they can reassess your need for medications and monitor your lab values.

## 2015-11-25 ENCOUNTER — Telehealth: Payer: Self-pay | Admitting: Internal Medicine

## 2015-11-25 NOTE — Telephone Encounter (Signed)
Scheduled on 12/03/15 at 2:30 PM with Alonza Bogus, PA. Please, call patient with appointment.

## 2015-11-28 ENCOUNTER — Encounter (HOSPITAL_COMMUNITY): Payer: Self-pay | Admitting: Internal Medicine

## 2015-11-29 DIAGNOSIS — R2243 Localized swelling, mass and lump, lower limb, bilateral: Secondary | ICD-10-CM | POA: Diagnosis not present

## 2015-11-29 DIAGNOSIS — Z8711 Personal history of peptic ulcer disease: Secondary | ICD-10-CM | POA: Diagnosis not present

## 2015-11-29 DIAGNOSIS — D649 Anemia, unspecified: Secondary | ICD-10-CM | POA: Diagnosis not present

## 2015-12-03 ENCOUNTER — Ambulatory Visit (INDEPENDENT_AMBULATORY_CARE_PROVIDER_SITE_OTHER): Payer: Medicare Other | Admitting: Physician Assistant

## 2015-12-03 ENCOUNTER — Encounter: Payer: Self-pay | Admitting: Physician Assistant

## 2015-12-03 VITALS — BP 168/88 | HR 86 | Ht 61.0 in | Wt 136.0 lb

## 2015-12-03 DIAGNOSIS — D500 Iron deficiency anemia secondary to blood loss (chronic): Secondary | ICD-10-CM | POA: Diagnosis not present

## 2015-12-03 DIAGNOSIS — K250 Acute gastric ulcer with hemorrhage: Secondary | ICD-10-CM | POA: Diagnosis not present

## 2015-12-03 DIAGNOSIS — K52831 Collagenous colitis: Secondary | ICD-10-CM | POA: Diagnosis not present

## 2015-12-03 NOTE — Progress Notes (Addendum)
Subjective:    Patient ID: EMME LITWILLER, female    DOB: 08-Nov-1931, 82 y.o.   MRN: [id number]  HPI  Markee is a very nice 82 year old white female known to Dr. Hilarie Fredrickson who has a diagnosis of collagenous colitis. She was last seen in the office in June 2017 by myself and at that time was restarted on Entocort at 9 mg daily for 1 month and then taper by 3 mg per month. Unfortunately patient had a very recent admission to [facility] on 11/23/2015 when she presented with an acute Maj. GI bleed. She relates that she did have her remote history of ulcer disease and had been treated for H. pylori in early 2000. She said she started feeling poorly about 48 hours prior to hospitalization with abdominal bloating and a "belly ache". She was diaphoretic all night that night during the night and the following morning was hypotensive at 88/55. She then abruptly started passing black tarry stools had 7 or 8 bowel movements and went to the emergency room.he did require transfusions, And underwent EGD per Dr. Melony Overly on 11/23/2015 with finding of a 6 mm cratered gastric ulcer with overlying clot which was treated with BiCAP and then one other superficial 4 mm gastric ulcer. CLOtest was negative. EGD report says that biopsy was taken that there is no pathology available and affect. We also called [facility] and they do not have any path specimen. There was concern that she may have a leiomyoma with overlying ulceration. It was recommended that she take Carafate 1 g 4 times daily and twice daily Protonix. He was also recommended for follow-up EGD in 8 weeks. She has been back to her PCP and hemoglobin was last done on 11/29/2015 stable at 10.4 hematocrit of 32. She says she feels fine currently ,she has had some intermittent gnawing "hunger" type discomfort in her epigastrium intermittently. Appetite has been fine, she's not had any nausea or vomiting and has no further melena. She did put herself on an  over-the-counter iron supplement. She mentions that she's had a headache continuously over the past couple of weeks since starting Protonix and Carafate. She is doing well from a colitis standpoint, has decreased Entocort to 3 mg by mouth daily as of today.  Review of Systems Pertinent positive and negative review of systems were noted in the above HPI section.  All other review of systems was otherwise negative.  Outpatient Encounter Prescriptions as of 12/03/2015  Medication Sig  . atorvastatin (LIPITOR) 10 MG tablet TAKE 1 TABLET BY MOUTH DAILY. GENERIC EQUIVALENT FOR LIPITOR (Patient taking differently: TAKE 1/2 TABLET BY MOUTH DAILY. GENERIC EQUIVALENT FOR LIPITOR)  . budesonide (ENTOCORT EC) 3 MG 24 hr capsule Take 3 tablets daily. (Patient taking differently: Take 3 mg by mouth daily. Take 3 tablets daily.)  . calcium carbonate (OS-CAL) 600 MG TABS Take 600 mg by mouth daily.    . clobetasol ointment (TEMOVATE) [id number] % Apply 1 application topically daily.  Marland Kitchen estradiol (ESTRACE) 0.5 MG tablet Take 0.5 mg by mouth daily.    Marland Kitchen latanoprost (XALATAN) 0.005 % ophthalmic solution Place 1 drop into both eyes at bedtime.    Marland Kitchen levothyroxine (SYNTHROID, LEVOTHROID) 125 MCG tablet Take 125 mcg by mouth daily.  . Multiple Vitamin (MULTIVITAMIN) tablet Take 1 tablet by mouth daily.    . norethindrone (AYGESTIN) 5 MG tablet Take 2.5 mg by mouth daily.   Marland Kitchen olmesartan (BENICAR) 20 MG tablet Take 20 mg by mouth daily.    Marland Kitchen  Omega-3 Fatty Acids (FISH OIL) 1200 MG CAPS Take 1 capsule by mouth daily.    . pantoprazole (PROTONIX) 40 MG tablet Take 1 tablet (40 mg total) by mouth 2 (two) times daily before a meal. Switch for any other PPI at similar dose and frequency  . sucralfate (CARAFATE) 1 GM/10ML suspension Take 10 mLs (1 g total) by mouth 4 (four) times daily -  with meals and at bedtime.  . vitamin C (ASCORBIC ACID) 500 MG tablet Take 500 mg by mouth daily.     No facility-administered encounter  medications on file as of 12/03/2015.    No Known Allergies Patient Active Problem List   Diagnosis Date Noted  . Acute blood loss anemia 11/22/2015  . Colitis 11/22/2015  . UGI bleed   . Melena   . History of Helicobacter pylori infection   . Other fatigue   . Weakness   . Collagenous colitis 07/16/2011  . Chronic diarrhea 06/09/2011  . History of peptic ulcer disease 05/08/2011  . Personal history of colonic polyps 05/08/2011  . Family history of malignant neoplasm of gastrointestinal tract 05/08/2011  . Other general symptoms  05/08/2011  . Iron deficiency anemia, unspecified  05/08/2011  . Diarrhea 05/08/2011  . Diverticulosis of colon (without mention of hemorrhage) 05/08/2011   Social History   Social History  . Marital status: Married    Spouse name: N/A  . Number of children: 2  . Years of education: N/A   Occupational History  . Retired Therapist, sports    Social History Main Topics  . Smoking status: Never Smoker  . Smokeless tobacco: Never Used  . Alcohol use Yes     Comment: occ  . Drug use: No  . Sexual activity: Not on file   Other Topics Concern  . Not on file   Social History Narrative  . No narrative on file    Ms. Teta family history includes Colon cancer in her brother; Colon polyps in her brother.      Objective:    Vitals:   12/03/15 1413  BP: (!) 168/88  Pulse: 86    Physical Exam  well-developed elderly white female in no acute distress, very pleasant accompanied by her husband blood pressure 168/88 pulse 86, BMI 25.7. HEENT ;nontraumatic cephalic EOMI PERRLA sclera anicteric, Cardiovascular; regular rate and rhythm with S1-S2 no murmur rub or gallop,, Pulmonary; clear bilaterally, Abdomen; soft, nontender nondistended bowel sounds are active there is no palpable mass or hepatosplenomegaly, Rectal; exam not done, Ext; no clubbing cyanosis or edema skin warm and dry, Neuropsych; mood and affect appropriate       Assessment & Plan:   #80   82 year old white female with collagenous colitis. Recent exacerbation, treated with Entocort and symptoms have resolved. She is gradually tapering dose #2 acute major upper GI bleed 11/23/2015 secondary to 6 mm cratered gastric ulcer possibly overlying a leiomyoma. Also identified 1 other superficial gastric ulcer. CLOtest and negative, no path available. #3 anemia posthemorrhagic stable  Plan; continue Entocort 3 mg by mouth daily 1 month then may discontinue if asymptomatic We'll stop Carafate secondary to complaints of ongoing headache Teeny Protonix 40 mg by mouth twice daily-I have asked her to call back in a week if headache is persisting we may at least be able to decrease dose of Protonix to once daily  Will schedule for follow-up EGD with Dr. Hilarie Fredrickson in early September to follow-up gastric ulcer, and will also need biopsies to rule out leiomyoma which  would put her at risk for current bleeding I've asked her to have a follow-up CBC done through her PCP- Dr. Nevada Crane in 3-4 weeks and she knows to call for any problems in the interim. Long-term PPI use relatively contraindicated with collagenous colitis as can be a trigger but she clearly needs it at this time.  Nelissa Bolduc S Keithon Mccoin PA-C 12/03/2015   Cc: [name], MD  Addendum: Reviewed and agree with initial management. I do think PPI is indicated BID even though PPI can exacerbated microscopic colitis I would also recommend consideration of EGD with EUS with Dr. Ardis Hughs to fully examine this lesion given that it appears (I reviewed EGD from last week) to have a submucosal component. I will cc: Dr. Ardis Hughs to see if he agrees.   Jerene Bears, MD

## 2015-12-03 NOTE — Patient Instructions (Signed)
Continue the Entocort 3 mg by mouth daily.  Stop the Carafate.  Continue the twice daily Protonix ( Pantoprazole Soduim ) 40 mg.  Get a follow up Hemoglobin checked in 3 weeks by your Primary Care Physician.   You have been scheduled for an endoscopy. Please follow written instructions given to you at your visit today. If you use inhalers (even only as needed), please bring them with you on the day of your procedure. Your physician has requested that you go to www.startemmi.com and enter the access code given to you at your visit today. This web site gives a general overview about your procedure. However, you should still follow specific instructions given to you by our office regarding your preparation for the procedure.

## 2015-12-04 ENCOUNTER — Telehealth: Payer: Self-pay

## 2015-12-04 ENCOUNTER — Other Ambulatory Visit: Payer: Self-pay

## 2015-12-04 DIAGNOSIS — K253 Acute gastric ulcer without hemorrhage or perforation: Secondary | ICD-10-CM

## 2015-12-04 DIAGNOSIS — R933 Abnormal findings on diagnostic imaging of other parts of digestive tract: Secondary | ICD-10-CM

## 2015-12-04 NOTE — Telephone Encounter (Signed)
Milus Banister, MD  Jerene Bears, MD; Amy Genia Harold, PA-C; Barron Alvine, RN        Hey,   I looked at [facility] EGD. The ulcer is a bit unusual appearing. I've seen metastasis to stomach look similarly actually. I think upper EUS is a good idea. Would want a bit of time to allow the ulceration to heal (on BID PPI for now). I'll look at EUS in 2-3 weeks. Thanks   Yaakov Saindon,  She needs upper EUS, radial +/- linear, in 2-3 weeks at [facility] with MAC sedation for abnormal stomach, ulcer.    Thanks

## 2015-12-04 NOTE — Telephone Encounter (Signed)
EUS scheduled for 12/19/15 730 am instructions mailed to the pt home.  Left message on machine to call back

## 2015-12-09 NOTE — Telephone Encounter (Signed)
EUS scheduled, pt instructed and medications reviewed.  Patient instructions mailed to home.  Patient to call with any questions or concerns.  

## 2015-12-12 ENCOUNTER — Encounter (HOSPITAL_COMMUNITY): Payer: Self-pay | Admitting: *Deleted

## 2015-12-18 NOTE — Anesthesia Preprocedure Evaluation (Addendum)
Anesthesia Evaluation  Patient identified by MRN, date of birth, ID band Patient awake    Reviewed: Allergy & Precautions, NPO status , Patient's Chart, lab work & pertinent test results  Airway Mallampati: II  TM Distance: >3 FB Neck ROM: Full    Dental no notable dental hx.    Pulmonary    Pulmonary exam normal        Cardiovascular hypertension, Pt. on medications + Peripheral Vascular Disease (hx of carotid stenosis) and +CHF (EF XX123456, grade 2 diastolic dysfunction)  Normal cardiovascular exam     Neuro/Psych  Headaches,    GI/Hepatic PUD,   Endo/Other  Hypothyroidism   Renal/GU      Musculoskeletal  (+) Arthritis ,   Abdominal   Peds  Hematology  (+) anemia ,   Anesthesia Other Findings   Reproductive/Obstetrics                            Anesthesia Physical Anesthesia Plan  ASA: III  Anesthesia Plan: MAC   Post-op Pain Management:    Induction: Intravenous  Airway Management Planned:   Additional Equipment: None  Intra-op Plan:   Post-operative Plan:   Informed Consent: I have reviewed the patients History and Physical, chart, labs and discussed the procedure including the risks, benefits and alternatives for the proposed anesthesia with the patient or authorized representative who has indicated his/her understanding and acceptance.   Dental advisory given  Plan Discussed with: CRNA and Surgeon  Anesthesia Plan Comments:         Anesthesia Quick Evaluation

## 2015-12-19 ENCOUNTER — Encounter (HOSPITAL_COMMUNITY)
Admission: RE | Disposition: A | Discharge status: Home / Self Care | Payer: Self-pay | Source: Ambulatory Visit | Attending: Gastroenterology

## 2015-12-19 ENCOUNTER — Ambulatory Visit (HOSPITAL_COMMUNITY)
Admission: RE | Admit: 2015-12-19 | Discharge: 2015-12-19 | Disposition: A | Discharge status: Home / Self Care | Payer: Medicare Other | Source: Ambulatory Visit | Attending: Gastroenterology | Admitting: Gastroenterology

## 2015-12-19 ENCOUNTER — Encounter (HOSPITAL_COMMUNITY): Payer: Self-pay

## 2015-12-19 ENCOUNTER — Ambulatory Visit (HOSPITAL_COMMUNITY): Payer: Medicare Other | Admitting: Anesthesiology

## 2015-12-19 DIAGNOSIS — D649 Anemia, unspecified: Secondary | ICD-10-CM | POA: Diagnosis not present

## 2015-12-19 DIAGNOSIS — Z79899 Other long term (current) drug therapy: Secondary | ICD-10-CM | POA: Insufficient documentation

## 2015-12-19 DIAGNOSIS — R51 Headache: Secondary | ICD-10-CM | POA: Insufficient documentation

## 2015-12-19 DIAGNOSIS — Z86010 Personal history of colonic polyps: Secondary | ICD-10-CM | POA: Insufficient documentation

## 2015-12-19 DIAGNOSIS — I739 Peripheral vascular disease, unspecified: Secondary | ICD-10-CM | POA: Diagnosis not present

## 2015-12-19 DIAGNOSIS — D500 Iron deficiency anemia secondary to blood loss (chronic): Secondary | ICD-10-CM | POA: Diagnosis not present

## 2015-12-19 DIAGNOSIS — Z79890 Hormone replacement therapy (postmenopausal): Secondary | ICD-10-CM | POA: Insufficient documentation

## 2015-12-19 DIAGNOSIS — I509 Heart failure, unspecified: Secondary | ICD-10-CM | POA: Insufficient documentation

## 2015-12-19 DIAGNOSIS — K259 Gastric ulcer, unspecified as acute or chronic, without hemorrhage or perforation: Secondary | ICD-10-CM | POA: Diagnosis not present

## 2015-12-19 DIAGNOSIS — E039 Hypothyroidism, unspecified: Secondary | ICD-10-CM | POA: Diagnosis not present

## 2015-12-19 DIAGNOSIS — R933 Abnormal findings on diagnostic imaging of other parts of digestive tract: Secondary | ICD-10-CM

## 2015-12-19 DIAGNOSIS — K2950 Unspecified chronic gastritis without bleeding: Secondary | ICD-10-CM | POA: Diagnosis not present

## 2015-12-19 DIAGNOSIS — M1990 Unspecified osteoarthritis, unspecified site: Secondary | ICD-10-CM | POA: Diagnosis not present

## 2015-12-19 DIAGNOSIS — K253 Acute gastric ulcer without hemorrhage or perforation: Secondary | ICD-10-CM

## 2015-12-19 DIAGNOSIS — K52831 Collagenous colitis: Secondary | ICD-10-CM | POA: Insufficient documentation

## 2015-12-19 DIAGNOSIS — I110 Hypertensive heart disease with heart failure: Secondary | ICD-10-CM | POA: Insufficient documentation

## 2015-12-19 DIAGNOSIS — K3189 Other diseases of stomach and duodenum: Secondary | ICD-10-CM

## 2015-12-19 DIAGNOSIS — Z800 Family history of malignant neoplasm of digestive organs: Secondary | ICD-10-CM | POA: Diagnosis not present

## 2015-12-19 DIAGNOSIS — I10 Essential (primary) hypertension: Secondary | ICD-10-CM | POA: Diagnosis not present

## 2015-12-19 HISTORY — PX: EUS: SHX5427

## 2015-12-19 SURGERY — ESOPHAGEAL ENDOSCOPIC ULTRASOUND (EUS) RADIAL
Anesthesia: Monitor Anesthesia Care

## 2015-12-19 MED ORDER — LACTATED RINGERS IV SOLN
INTRAVENOUS | Status: DC | PRN
  Administered 2015-12-19: 07:00:00 via INTRAVENOUS

## 2015-12-19 MED ORDER — GLYCOPYRROLATE 0.2 MG/ML IJ SOLN
INTRAMUSCULAR | Status: DC | PRN
  Administered 2015-12-19: 0.2 mg via INTRAVENOUS

## 2015-12-19 MED ORDER — LIDOCAINE HCL (CARDIAC) 20 MG/ML IV SOLN
INTRAVENOUS | Status: AC
  Filled 2015-12-19: qty 5

## 2015-12-19 MED ORDER — PROPOFOL 10 MG/ML IV BOLUS
INTRAVENOUS | Status: AC
  Filled 2015-12-19: qty 40

## 2015-12-19 MED ORDER — LABETALOL HCL 5 MG/ML IV SOLN
INTRAVENOUS | Status: AC
  Filled 2015-12-19: qty 4

## 2015-12-19 MED ORDER — GLYCOPYRROLATE 0.2 MG/ML IJ SOLN
INTRAMUSCULAR | Status: AC
  Filled 2015-12-19: qty 1

## 2015-12-19 MED ORDER — PROPOFOL 500 MG/50ML IV EMUL
INTRAVENOUS | Status: DC | PRN
  Administered 2015-12-19: 300 ug/kg/min via INTRAVENOUS

## 2015-12-19 MED ORDER — SODIUM CHLORIDE 0.9 % IV SOLN: INTRAVENOUS | Status: DC

## 2015-12-19 MED ORDER — LACTATED RINGERS IV SOLN: INTRAVENOUS | Status: DC

## 2015-12-19 MED ORDER — LIDOCAINE HCL (CARDIAC) 20 MG/ML IV SOLN
INTRAVENOUS | Status: DC | PRN
  Administered 2015-12-19: 100 mg via INTRAVENOUS

## 2015-12-19 MED ORDER — LABETALOL HCL 5 MG/ML IV SOLN
INTRAVENOUS | Status: DC | PRN
  Administered 2015-12-19: 5 mg via INTRAVENOUS
  Administered 2015-12-19: 2.5 mg via INTRAVENOUS

## 2015-12-19 NOTE — Anesthesia Postprocedure Evaluation (Signed)
Anesthesia Post Note  Patient: Carol Wells  Procedure(s) Performed: Procedure(s) (LRB): ESOPHAGEAL ENDOSCOPIC ULTRASOUND (EUS) RADIAL (N/A)  Patient location during evaluation: PACU Anesthesia Type: MAC Level of consciousness: awake and alert Pain management: pain level controlled Vital Signs Assessment: post-procedure vital signs reviewed and stable Respiratory status: spontaneous breathing, nonlabored ventilation, respiratory function stable and patient connected to nasal cannula oxygen Cardiovascular status: stable and blood pressure returned to baseline Anesthetic complications: no    Last Vitals:  Vitals:   12/19/15 0629 12/19/15 0753  BP: (!) 159/69 (!) 106/56  Pulse: 84 92  Resp: 16 (!) 22  Temp: 36.6 C 36.5 C    Last Pain:  Vitals:   12/19/15 0753  TempSrc: Oral                 Reginal Lutes

## 2015-12-19 NOTE — Op Note (Signed)
[facility] Patient Name: Carol Wells Procedure Date: 12/19/2015 MRN: [id number] Attending MD: Milus Banister , MD Date of Birth: [birth year] CSN: [id number] Age: 82 Admit Type: Outpatient Procedure:                Upper EUS Indications:              Gastric deformity on endoscopy/Subepithelial tumor                            versus extrinsic compression (EGD 4 weeks ago Dr.                            Laural Golden during acute GI bleed, proximal gastric                            ulcer with somewhat subepithelial component; CT                            scan around that time showed no masses, tumors;                            feels well since discharge, no weight loss) Providers:                Milus Banister, MD, Cleda Daub, RN, Alfonso Patten, Technician, Enrigue Catena, CRNA Referring MD:             Zenovia Jarred, MD Medicines:                Monitored Anesthesia Care Complications:            No immediate complications. Estimated blood loss:                            None. Estimated Blood Loss:     Estimated blood loss: none. Procedure:                Pre-Anesthesia Assessment:                           - Prior to the procedure, a History and Physical                            was performed, and patient medications and                            allergies were reviewed. The patient's tolerance of                            previous anesthesia was also reviewed. The risks                            and benefits of the procedure and the sedation  options and risks were discussed with the patient.                            All questions were answered, and informed consent                            was obtained. Prior Anticoagulants: The patient has                            taken no previous anticoagulant or antiplatelet                            agents. ASA Grade Assessment: II - A patient with        mild systemic disease. After reviewing the risks                            and benefits, the patient was deemed in                            satisfactory condition to undergo the procedure.                           After obtaining informed consent, the endoscope was                            passed under direct vision. Throughout the                            procedure, the patient's blood pressure, pulse, and                            oxygen saturations were monitored continuously. The                            [id number]) scope was introduced through                            the mouth, and advanced to the duodenal bulb. The                            Endoscope was introduced through the mouth, and                            advanced to the duodenal bulb. The upper EUS was                            accomplished without difficulty. The patient                            tolerated the procedure well. Scope In: Scope Out: Findings:      Endoscopic Finding :      1. The previously noted proximal gastric ulcer site is overall improved       but not completely resolved.  The ulcer crater is gone, the surrounding       mucosal edema is improved and it does not have a submucosal appearance       anymore. The mucosa at the site was extensively biopsied.      Endosonographic Finding :      1. The gastric wall underlying the ulcer above shows non-specific       mucosal, submucosal thickening by EUS. There are no concerning mass-like       features.      2. The remaining gastric wall is normal.      3. Limited views of liver, spleen, pancreas, portal and splenic vessels       were all normal. Impression:               - The recently noted proximal gastric ulcer site is                            healing, the overall appearance is much less                            concerning for underlying submucosal component and                            EUS imaging found only  non-specific mucosal,                            submucosal thickening at the site. The mucosa was                            biopsied with forceps and sent to pathology. Moderate Sedation:      N/A- Per Anesthesia Care Recommendation:           - Discharge patient to home (ambulatory).                           - Resume regular diet.                           - Continue present medications. Please continue                            protonix twice daily for another 4 weeks then                            decrease to once daily. Pending final pathology                            results, you may need one further look at the site                            (with EGD) to confirm healing. Procedure Code(s):        --- Professional ---                           [phone], Esophagogastroduodenoscopy, flexible,  transoral; with endoscopic ultrasound examination                            limited to the esophagus, stomach or duodenum, and                            adjacent structures Diagnosis Code(s):        --- Professional ---                           K31.89, Other diseases of stomach and duodenum CPT copyright 2016 American [employer] Association. All rights reserved. The codes documented in this report are preliminary and upon coder review may  be revised to meet current compliance requirements. Milus Banister, MD 12/19/2015 7:57:00 AM This report has been signed electronically. Number of Addenda: 0

## 2015-12-19 NOTE — Discharge Instructions (Signed)

## 2015-12-19 NOTE — Transfer of Care (Signed)
Immediate Anesthesia Transfer of Care Note  Patient: Carol Wells  Procedure(s) Performed: Procedure(s): ESOPHAGEAL ENDOSCOPIC ULTRASOUND (EUS) RADIAL (N/A)  Patient Location: PACU  Anesthesia Type:MAC  Level of Consciousness: awake, alert , oriented and patient cooperative  Airway & Oxygen Therapy: Patient Spontanous Breathing and Patient connected to nasal cannula oxygen  Post-op Assessment: Report given to RN, Post -op Vital signs reviewed and stable and Patient moving all extremities X 4  Post vital signs: stable  Last Vitals:  Vitals:   12/19/15 0629 12/19/15 0753  BP: (!) 159/69 (!) 106/56  Pulse: 84 92  Resp: 16 (!) 22  Temp: 36.6 C 36.5 C    Last Pain:  Vitals:   12/19/15 0753  TempSrc: Oral         Complications: No apparent anesthesia complications

## 2015-12-19 NOTE — Interval H&P Note (Signed)
History and Physical Interval Note:  12/19/2015 7:00 AM  Carol Wells  has presented today for surgery, with the diagnosis of abnormal stomach, ulcer   The various methods of treatment have been discussed with the patient and family. After consideration of risks, benefits and other options for treatment, the patient has consented to  Procedure(s): ESOPHAGEAL ENDOSCOPIC ULTRASOUND (EUS) RADIAL (N/A) as a surgical intervention .  The patient's history has been reviewed, patient examined, no change in status, stable for surgery.  I have reviewed the patient's chart and labs.  Questions were answered to the patient's satisfaction.     Milus Banister

## 2015-12-19 NOTE — H&P (View-Only) (Signed)
Subjective:    Patient ID: Carol Wells, female    DOB: 28-Feb-1932, 82 y.o.   MRN: [id number]  HPI  Carol Wells is a very nice 82 year old white female known to Dr. Hilarie Fredrickson who has a diagnosis of collagenous colitis. She was last seen in the office in June 2017 by myself and at that time was restarted on Entocort at 80 mg daily for 1 month and then taper by 3 mg per month. Unfortunately patient had a very recent admission to [facility] on 11/23/2015 when she presented with an acute Maj. GI bleed. She relates that she did have her remote history of ulcer disease and had been treated for H. pylori in early 2000. She said she started feeling poorly about 48 hours prior to hospitalization with abdominal bloating and a "belly ache". She was diaphoretic all night that night during the night and the following morning was hypotensive at 88/55. She then abruptly started passing black tarry stools had 7 or 8 bowel movements and went to the emergency room.he did require transfusions, And underwent EGD per Dr. Melony Overly on 11/23/2015 with finding of a 6 mm cratered gastric ulcer with overlying clot which was treated with BiCAP and then one other superficial 4 mm gastric ulcer. CLOtest was negative. EGD report says that biopsy was taken that there is no pathology available and affect. We also called [facility] and they do not have any path specimen. There was concern that she may have a leiomyoma with overlying ulceration. It was recommended that she take Carafate 1 g 4 times daily and twice daily Protonix. He was also recommended for follow-up EGD in 8 weeks. She has been back to her PCP and hemoglobin was last done on 11/29/2015 stable at 10.4 hematocrit of 32. She says she feels fine currently ,she has had some intermittent gnawing "hunger" type discomfort in her epigastrium intermittently. Appetite has been fine, she's not had any nausea or vomiting and has no further melena. She did put herself on an  over-the-counter iron supplement. She mentions that she's had a headache continuously over the past couple of weeks since starting Protonix and Carafate. She is doing well from a colitis standpoint, has decreased Entocort to 3 mg by mouth daily as of today.  Review of Systems Pertinent positive and negative review of systems were noted in the above HPI section.  All other review of systems was otherwise negative.  Outpatient Encounter Prescriptions as of 12/03/2015  Medication Sig  . atorvastatin (LIPITOR) 10 MG tablet TAKE 1 TABLET BY MOUTH DAILY. GENERIC EQUIVALENT FOR LIPITOR (Patient taking differently: TAKE 1/2 TABLET BY MOUTH DAILY. GENERIC EQUIVALENT FOR LIPITOR)  . budesonide (ENTOCORT EC) 3 MG 24 hr capsule Take 3 tablets daily. (Patient taking differently: Take 3 mg by mouth daily. Take 3 tablets daily.)  . calcium carbonate (OS-CAL) 600 MG TABS Take 600 mg by mouth daily.    . clobetasol ointment (TEMOVATE) [id number] % Apply 1 application topically daily.  Marland Kitchen estradiol (ESTRACE) 0.5 MG tablet Take 0.5 mg by mouth daily.    Marland Kitchen latanoprost (XALATAN) 0.005 % ophthalmic solution Place 1 drop into both eyes at bedtime.    Marland Kitchen levothyroxine (SYNTHROID, LEVOTHROID) 125 MCG tablet Take 125 mcg by mouth daily.  . Multiple Vitamin (MULTIVITAMIN) tablet Take 1 tablet by mouth daily.    . norethindrone (AYGESTIN) 5 MG tablet Take 2.5 mg by mouth daily.   Marland Kitchen olmesartan (BENICAR) 20 MG tablet Take 20 mg by mouth daily.    Marland Kitchen  Omega-3 Fatty Acids (FISH OIL) 1200 MG CAPS Take 1 capsule by mouth daily.    . pantoprazole (PROTONIX) 40 MG tablet Take 1 tablet (40 mg total) by mouth 2 (two) times daily before a meal. Switch for any other PPI at similar dose and frequency  . sucralfate (CARAFATE) 1 GM/10ML suspension Take 10 mLs (1 g total) by mouth 4 (four) times daily -  with meals and at bedtime.  . vitamin C (ASCORBIC ACID) 500 MG tablet Take 500 mg by mouth daily.     No facility-administered encounter  medications on file as of 12/03/2015.    No Known Allergies Patient Active Problem List   Diagnosis Date Noted  . Acute blood loss anemia 11/22/2015  . Colitis 11/22/2015  . UGI bleed   . Melena   . History of Helicobacter pylori infection   . Other fatigue   . Weakness   . Collagenous colitis 07/16/2011  . Chronic diarrhea 06/09/2011  . History of peptic ulcer disease 05/08/2011  . Personal history of colonic polyps 05/08/2011  . Family history of malignant neoplasm of gastrointestinal tract 05/08/2011  . Other general symptoms  05/08/2011  . Iron deficiency anemia, unspecified  05/08/2011  . Diarrhea 05/08/2011  . Diverticulosis of colon (without mention of hemorrhage) 05/08/2011   Social History   Social History  . Marital status: Married    Spouse name: N/A  . Number of children: 2  . Years of education: N/A   Occupational History  . Retired Therapist, sports    Social History Main Topics  . Smoking status: Never Smoker  . Smokeless tobacco: Never Used  . Alcohol use Yes     Comment: occ  . Drug use: No  . Sexual activity: Not on file   Other Topics Concern  . Not on file   Social History Narrative  . No narrative on file    Carol Wells family history includes Colon cancer in her brother; Colon polyps in her brother.      Objective:    Vitals:   12/03/15 1413  BP: (!) 168/88  Pulse: 86    Physical Exam  well-developed elderly white female in no acute distress, very pleasant accompanied by her husband blood pressure 168/88 pulse 86, BMI 25.7. HEENT ;nontraumatic cephalic EOMI PERRLA sclera anicteric, Cardiovascular; regular rate and rhythm with S1-S2 no murmur rub or gallop,, Pulmonary; clear bilaterally, Abdomen; soft, nontender nondistended bowel sounds are active there is no palpable mass or hepatosplenomegaly, Rectal; exam not done, Ext; no clubbing cyanosis or edema skin warm and dry, Neuropsych; mood and affect appropriate       Assessment & Plan:   #27   82 year old white female with collagenous colitis. Recent exacerbation, treated with Entocort and symptoms have resolved. She is gradually tapering dose #2 acute major upper GI bleed 11/23/2015 secondary to 6 mm cratered gastric ulcer possibly overlying a leiomyoma. Also identified 1 other superficial gastric ulcer. CLOtest and negative, no path available. #3 anemia posthemorrhagic stable  Plan; continue Entocort 3 mg by mouth daily 1 month then may discontinue if asymptomatic We'll stop Carafate secondary to complaints of ongoing headache Carol Wells Protonix 40 mg by mouth twice daily-I have asked her to call back in a week if headache is persisting we may at least be able to decrease dose of Protonix to once daily  Will schedule for follow-up EGD with Dr. Hilarie Fredrickson in early September to follow-up gastric ulcer, and will also need biopsies to rule out leiomyoma which  would put her at risk for current bleeding I've asked her to have a follow-up CBC done through her PCP- Dr. Nevada Crane in 3-4 weeks and she knows to call for any problems in the interim. Long-term PPI use relatively contraindicated with collagenous colitis as can be a trigger but she clearly needs it at this time.  Amy S Esterwood PA-C 12/03/2015   Cc: [name], MD  Addendum: Reviewed and agree with initial management. I do think PPI is indicated BID even though PPI can exacerbated microscopic colitis I would also recommend consideration of EGD with EUS with Dr. Ardis Hughs to fully examine this lesion given that it appears (I reviewed EGD from last week) to have a submucosal component. I will cc: Dr. Ardis Hughs to see if he agrees.   Jerene Bears, MD

## 2015-12-20 ENCOUNTER — Encounter (HOSPITAL_COMMUNITY): Payer: Self-pay | Admitting: Gastroenterology

## 2015-12-23 ENCOUNTER — Telehealth: Payer: Self-pay | Admitting: Gastroenterology

## 2015-12-23 NOTE — Telephone Encounter (Signed)
The pt was told that a decision will be made after the path result is reviewed and we will call her with recommendations

## 2015-12-30 ENCOUNTER — Telehealth: Payer: Self-pay | Admitting: *Deleted

## 2015-12-30 ENCOUNTER — Telehealth: Payer: Self-pay | Admitting: Internal Medicine

## 2015-12-30 DIAGNOSIS — R9389 Abnormal findings on diagnostic imaging of other specified body structures: Secondary | ICD-10-CM

## 2015-12-30 NOTE — Telephone Encounter (Signed)
Spoke with pt and she is aware that it is ok to proceed with EGD as scheduled.

## 2015-12-30 NOTE — Telephone Encounter (Signed)
-----   Message from Jerene Bears, MD sent at 12/27/2015  8:59 AM EDT ----- Regarding: EGD Thanks Trula Slade do I have any openings soon for EGD for repeat bx of gastric lesion? If so, please arrange If not Dr. Ardis Hughs has offered to help Thanks JMP   ----- Message ----- From: Milus Banister, MD Sent: 12/27/2015   7:00 AM To: Barron Alvine, RN, Jerene Bears, MD  Patty, Please call her.  The mucosal biopsies from last week EGD (during EUS) showed no obvious cancer or infection but there were some subtle changes (atypical lymphoid infiltrate) that deserve further attention.  She needs repeat biopsy of the area to obtain more tissue, ? Maltoma.    Ulice Dash, See above.  I'm happy to do this if you can't get her in in next couple weeks.  She needs more biopsies of the area.  Let me know.  Thanks  DJ

## 2015-12-30 NOTE — Telephone Encounter (Signed)
I have spoken to patient who has scheduled follow up endoscopy on 01/07/16 @ 10 am. She verbalizes understanding.

## 2015-12-31 DIAGNOSIS — E782 Mixed hyperlipidemia: Secondary | ICD-10-CM | POA: Diagnosis not present

## 2016-01-01 ENCOUNTER — Encounter: Payer: Medicare Other | Admitting: Internal Medicine

## 2016-01-03 ENCOUNTER — Other Ambulatory Visit: Payer: Self-pay

## 2016-01-07 ENCOUNTER — Encounter: Payer: Self-pay | Admitting: Internal Medicine

## 2016-01-07 ENCOUNTER — Ambulatory Visit (AMBULATORY_SURGERY_CENTER): Payer: Medicare Other | Admitting: Internal Medicine

## 2016-01-07 ENCOUNTER — Encounter: Payer: Medicare Other | Admitting: Internal Medicine

## 2016-01-07 VITALS — BP 147/82 | HR 69 | Temp 97.8°F | Resp 18 | Ht 61.0 in | Wt 136.0 lb

## 2016-01-07 DIAGNOSIS — I6529 Occlusion and stenosis of unspecified carotid artery: Secondary | ICD-10-CM | POA: Diagnosis not present

## 2016-01-07 DIAGNOSIS — K250 Acute gastric ulcer with hemorrhage: Secondary | ICD-10-CM

## 2016-01-07 DIAGNOSIS — K2970 Gastritis, unspecified, without bleeding: Secondary | ICD-10-CM | POA: Diagnosis not present

## 2016-01-07 DIAGNOSIS — E039 Hypothyroidism, unspecified: Secondary | ICD-10-CM | POA: Diagnosis not present

## 2016-01-07 DIAGNOSIS — K2951 Unspecified chronic gastritis with bleeding: Secondary | ICD-10-CM | POA: Diagnosis not present

## 2016-01-07 DIAGNOSIS — K2990 Gastroduodenitis, unspecified, without bleeding: Secondary | ICD-10-CM | POA: Diagnosis not present

## 2016-01-07 DIAGNOSIS — I10 Essential (primary) hypertension: Secondary | ICD-10-CM | POA: Diagnosis not present

## 2016-01-07 MED ORDER — SODIUM CHLORIDE 0.9 % IV SOLN: 500.0000 mL | INTRAVENOUS | Status: DC

## 2016-01-07 NOTE — Patient Instructions (Signed)
YOU HAD AN ENDOSCOPIC PROCEDURE TODAY AT [facility]:   Refer to the procedure report that was given to you for any specific questions about what was found during the examination.  If the procedure report does not answer your questions, please call your gastroenterologist to clarify.  If you requested that your care partner not be given the details of your procedure findings, then the procedure report has been included in a sealed envelope for you to review at your convenience later.  YOU SHOULD EXPECT: Some feelings of bloating in the abdomen. Passage of more gas than usual.  Walking can help get rid of the air that was put into your GI tract during the procedure and reduce the bloating. If you had a lower endoscopy (such as a colonoscopy or flexible sigmoidoscopy) you may notice spotting of blood in your stool or on the toilet paper. If you underwent a bowel prep for your procedure, you may not have a normal bowel movement for a few days.  Please Note:  You might notice some irritation and congestion in your nose or some drainage.  This is from the oxygen used during your procedure.  There is no need for concern and it should clear up in a day or so.  SYMPTOMS TO REPORT IMMEDIATELY:   Following lower endoscopy (colonoscopy or flexible sigmoidoscopy):  Excessive amounts of blood in the stool  Significant tenderness or worsening of abdominal pains  Swelling of the abdomen that is new, acute  Fever of 100F or higher   Following upper endoscopy (EGD)  Vomiting of blood or coffee ground material  New chest pain or pain under the shoulder blades  Painful or persistently difficult swallowing  New shortness of breath  Fever of 100F or higher  Black, tarry-looking stools  For urgent or emergent issues, a gastroenterologist can be reached at any hour by calling [phone].   DIET:  We do recommend a small meal at first, but then you may proceed to your regular diet.  Drink  plenty of fluids but you should avoid alcoholic beverages for 24 hours.  ACTIVITY:  You should plan to take it easy for the rest of today and you should NOT DRIVE or use heavy machinery until tomorrow (because of the sedation medicines used during the test).    FOLLOW UP: Our staff will call the number listed on your records the next business day following your procedure to check on you and address any questions or concerns that you may have regarding the information given to you following your procedure. If we do not reach you, we will leave a message.  However, if you are feeling well and you are not experiencing any problems, there is no need to return our call.  We will assume that you have returned to your regular daily activities without incident.  If any biopsies were taken you will be contacted by phone or by letter within the next 1-3 weeks.  Please call us at [phone] if you have not heard about the biopsies in 3 weeks.    SIGNATURES/CONFIDENTIALITY: You and/or your care partner have signed paperwork which will be entered into your electronic medical record.  These signatures attest to the fact that that the information above on your After Visit Summary has been reviewed and is understood.  Full responsibility of the confidentiality of this discharge information lies with you and/or your care-partner.    Handouts were given to your care partner on gastritis  and a hiatal hernia. No aspirin, aspirin products,  ibuprofen, naproxen, advil, motrin, aleve, or other non-steroidal anti-inflammatory drugs. Continue taking Pantoprazole 40 mg twice per day. You may resume your other current medications today. Await biopsy results. Please call if any questions or concerns.

## 2016-01-07 NOTE — Progress Notes (Signed)
To recovery, report to Willis, RN, VSS 

## 2016-01-07 NOTE — Op Note (Signed)
[place] Patient Name: Carol Wells Procedure Date: 01/07/2016 10:27 AM MRN: [id number] Endoscopist: Jerene Bears , MD Age: 82 Referring MD:  Date of Birth: 1931/11/26 Gender: Female Account #: [id number] Procedure:                Upper GI endoscopy Indications:              Follow-up of acute gastric ulcer with hemorrhage,                            ulceration in the gastric cardia/fundus initially                            seen 11/24/2015, follow-up EUS with biopsy on                            12/19/2015 showing healing of mucosal based lesion -                            pathology showing atypical lymphoid cells. History                            of remote H. pylori infection s/p treatment. Medicines:                Monitored Anesthesia Care Procedure:                Pre-Anesthesia Assessment:                           - Prior to the procedure, a History and Physical                            was performed, and patient medications and                            allergies were reviewed. The patient's tolerance of                            previous anesthesia was also reviewed. The risks                            and benefits of the procedure and the sedation                            options and risks were discussed with the patient.                            All questions were answered, and informed consent                            was obtained. Prior Anticoagulants: The patient has                            taken no previous anticoagulant or antiplatelet  agents. ASA Grade Assessment: III - A patient with                            severe systemic disease. After reviewing the risks                            and benefits, the patient was deemed in                            satisfactory condition to undergo the procedure.                           After obtaining informed consent, the endoscope was                            passed  under direct vision. Throughout the                            procedure, the patient's blood pressure, pulse, and                            oxygen saturations were monitored continuously. The                            Model GIF-HQ190 [id number]) scope was introduced                            through the mouth, and advanced to the second part                            of duodenum. The upper GI endoscopy was                            accomplished without difficulty. The patient                            tolerated the procedure well. Scope In: Scope Out: Findings:                 The examined esophagus was normal.                           A 3 cm hiatal hernia was present.                           Localized nodular mucosa was found in the cardia at                            site of previously documented ulceration. This area                            is healing and is without ulceration or bleeding  now. Multiple biopsies were obtained with cold                            forceps for histology in a targeted manner from the                            nodular mucosa.                           Localized mild inflammation characterized by                            erosions and erythema was found in the prepyloric                            antrum region of the stomach. Biopsies were taken                            with a cold forceps for histology and Helicobacter                            pylori testing (prepyloric, incisura and gastric                            body).                           The examined duodenum was normal. Complications:            No immediate complications. Estimated Blood Loss:     Estimated blood loss was minimal. Impression:               - Normal esophagus.                           - 3 cm hiatal hernia.                           - Nodular mucosa in the cardia at site of prior                            ulcer. Biopsied  extensively.                           - Antral gastritis. Biopsied.                           - Normal examined duodenum. Recommendation:           - Patient has a contact number available for                            emergencies. The signs and symptoms of potential                            delayed complications were discussed with the  patient. Return to normal activities tomorrow.                            Written discharge instructions were provided to the                            patient.                           - Resume previous diet.                           - Continue present medications.                           - No aspirin, ibuprofen, naproxen, or other                            non-steroidal anti-inflammatory drugs.                           - Await pathology results.                           - Repeat upper endoscopy after studies are complete                            for surveillance based on pathology results.                           - Continue pantoprazole 40 mg twice daily. Jerene Bears, MD 01/07/2016 11:03:02 AM This report has been signed electronically.

## 2016-01-07 NOTE — Progress Notes (Signed)
No problems noted in the recovery room. maw 

## 2016-01-08 ENCOUNTER — Telehealth: Payer: Self-pay

## 2016-01-08 NOTE — Telephone Encounter (Signed)
  Follow up Call-  Call back number 01/07/2016  Post procedure Call Back phone  # [phone]  Permission to leave phone message Yes  Some recent data might be hidden     Patient questions:  Do you have a fever, pain , or abdominal swelling? Yes.   Pain Score  0 *  Have you tolerated food without any problems? Yes.    Have you been able to return to your normal activities? Yes.    Do you have any questions about your discharge instructions: Diet   No. Medications  No. Follow up visit  No.  Do you have questions or concerns about your Care? No.  Actions: * If pain score is 4 or above: No action needed, pain <4.

## 2016-01-10 ENCOUNTER — Encounter: Payer: Self-pay | Admitting: Internal Medicine

## 2016-01-23 ENCOUNTER — Encounter: Payer: Medicare Other | Admitting: Internal Medicine

## 2016-02-05 ENCOUNTER — Other Ambulatory Visit (INDEPENDENT_AMBULATORY_CARE_PROVIDER_SITE_OTHER): Payer: Medicare Other

## 2016-02-05 ENCOUNTER — Encounter: Payer: Self-pay | Admitting: Internal Medicine

## 2016-02-05 ENCOUNTER — Ambulatory Visit (INDEPENDENT_AMBULATORY_CARE_PROVIDER_SITE_OTHER): Payer: Medicare Other | Admitting: Internal Medicine

## 2016-02-05 VITALS — BP 122/74 | HR 80 | Ht 61.0 in | Wt 132.1 lb

## 2016-02-05 DIAGNOSIS — K52831 Collagenous colitis: Secondary | ICD-10-CM

## 2016-02-05 DIAGNOSIS — D500 Iron deficiency anemia secondary to blood loss (chronic): Secondary | ICD-10-CM

## 2016-02-05 DIAGNOSIS — D259 Leiomyoma of uterus, unspecified: Secondary | ICD-10-CM | POA: Diagnosis not present

## 2016-02-05 DIAGNOSIS — K254 Chronic or unspecified gastric ulcer with hemorrhage: Secondary | ICD-10-CM

## 2016-02-05 DIAGNOSIS — K4090 Unilateral inguinal hernia, without obstruction or gangrene, not specified as recurrent: Secondary | ICD-10-CM

## 2016-02-05 LAB — CBC WITH DIFFERENTIAL/PLATELET
BASOS PCT: 0.3 % (ref 0.0–3.0)
Basophils Absolute: 0 10*3/uL (ref 0.0–0.1)
EOS ABS: 0.1 10*3/uL (ref 0.0–0.7)
EOS PCT: 1.5 % (ref 0.0–5.0)
HEMATOCRIT: 34.7 % — AB (ref 36.0–46.0)
HEMOGLOBIN: 11.5 g/dL — AB (ref 12.0–15.0)
LYMPHS PCT: 14.7 % (ref 12.0–46.0)
Lymphs Abs: 1.3 10*3/uL (ref 0.7–4.0)
MCHC: 33.2 g/dL (ref 30.0–36.0)
MCV: 80.6 fl (ref 78.0–100.0)
Monocytes Absolute: 0.5 10*3/uL (ref 0.1–1.0)
Monocytes Relative: 5.5 % (ref 3.0–12.0)
Neutro Abs: 6.8 10*3/uL (ref 1.4–7.7)
Neutrophils Relative %: 78 % — ABNORMAL HIGH (ref 43.0–77.0)
Platelets: 342 10*3/uL (ref 150.0–400.0)
RBC: 4.3 Mil/uL (ref 3.87–5.11)
RDW: 16.7 % — AB (ref 11.5–15.5)
WBC: 8.7 10*3/uL (ref 4.0–10.5)

## 2016-02-05 LAB — IBC PANEL
Iron: 33 ug/dL — ABNORMAL LOW (ref 42–145)
Saturation Ratios: 8.9 % — ABNORMAL LOW (ref 20.0–50.0)
Transferrin: 264 mg/dL (ref 212.0–360.0)

## 2016-02-05 LAB — FERRITIN: FERRITIN: 9.3 ng/mL — AB (ref 10.0–291.0)

## 2016-02-05 MED ORDER — BUDESONIDE 3 MG PO CPEP: ORAL_CAPSULE | ORAL | 0 refills | Status: DC

## 2016-02-05 MED ORDER — PANTOPRAZOLE SODIUM 40 MG PO TBEC: 40.0000 mg | DELAYED_RELEASE_TABLET | Freq: Two times a day (BID) | ORAL | 3 refills | Status: DC

## 2016-02-05 NOTE — Patient Instructions (Addendum)
You have been scheduled to see Dr Hilarie Fredrickson on Tuesday, 05/05/16 @ 1:30 pm.  Your physician has requested that you go to the basement for the following lab work before leaving today: GI pathogen panel, IBC, ferritin, CBC  We have sent the following medications to your pharmacy for you to pick up at your convenience: Pantoprazole 40 mg twice daily  Resume Entocort 9 mg (3 tablets) daily x 4 weeks, then decrease to Entocort 6 mg (2 tablets) daily until follow up.  You have been scheduled for an appointment with __________ at [facility]. Your appointment is on _________ at ___________. Please arrive at _____________ for registration. Make certain to bring a list of current medications, including any over the counter medications or vitamins. Also bring your co-pay if you have one as well as your insurance cards. [facility] is located at [address]. Should you need to reschedule your appointment, please contact them at [phone].  If you are age 80 or older, your body mass index should be between 23-30. Your Body mass index is 24.96 kg/m. If this is out of the aforementioned range listed, please consider follow up with your Primary Care Provider.  If you are age 42 or younger, your body mass index should be between 19-25. Your Body mass index is 24.96 kg/m. If this is out of the aformentioned range listed, please consider follow up with your Primary Care Provider.

## 2016-02-05 NOTE — Progress Notes (Signed)
Subjective:    Patient ID: Carol Wells, female    DOB: [birth year], 82 y.o.   MRN: [id number]  HPI Carol Wells is an 82 year old female with recent gastric cardia ulcer with resultant GI bleed, history of collagenous colitis who is here for follow-up. She is here today with her husband. She came for an upper endoscopy on 01/07/2016 to follow-up her gastric ulcer. This was diagnosed at the time of GI bleed in July 2017 and then she underwent EUS with Dr. Ardis Hughs which showed the ulcer was benign. At the time of her surveillance endoscopy there was nodular mucosa in the gastric cardia without ulceration. It was biopsied multiple times. Mild inflammation in the distal stomach was also biopsied. Pathology was benign without metaplasia or dysplasia. There was no H. pylori. There was chronic active gastritis. Immunohistochemical stains were not consistent with lymphoma. He has continued on pantoprazole 40 mg twice daily and she reports her upper abdominal pain, burning, nausea have all resolved.  She is having more issues similar to June 2017 when she met with Nicoletta Ba, PA-C, specifically regarding her history of microscopic colitis. She reports that she has noticed looser stools, particularly after breakfast and later in the day. Her first stool of the day is after waking which is normally more formed. She's noticed the stools are lighter in color, looser and have a foul odor. She's also noticed increased intestinal gas and lower bloating. She has not had abdominal pain. She reports mild discomfort related to the bloating but states "I cannot call this pain". She was started on Entocort in June initially 9 mg which was weaned to 3 mg and then stopped 3 days ago. She seemed to remember doing very well with the 9 mg and 6 mg dose but symptoms returned as she got towards the 3 mg dose. Again due to her GI bleed and ulcer she is somewhat unclear on the exact timing of her symptom return. She has not seen blood  in her stool or melena.  She also is known to have a large uterine fibroid which she states her gynecologist Dr. Edwyna Ready follows annually, usually with ultrasound. This was seen by CT scan in July 2017.  Finally she notes a left hernia in her groin. She states this seems bigger but is not painful.  She has remained on oral iron since her GI bleed  Review of Systems As per history of present illness, otherwise negative  Current Medications, Allergies, Past Medical History, Past Surgical History, Family History and Social History were reviewed in [employer] record.     Objective:   Physical Exam BP 122/74   Pulse 80   Ht _0  (1.549 m)   Wt 132 lb 2 oz (59.9 kg)   BMI 24.96 kg/m  Constitutional: Well-developed and well-nourished. No distress. HEENT: Normocephalic and atraumatic. Oropharynx is clear and moist. No oropharyngeal exudate. Conjunctivae are normal.  No scleral icterus. Neck: Neck supple. Trachea midline. Cardiovascular: Normal rate, regular rhythm and intact distal pulses. No M/R/G Pulmonary/chest: Effort normal and breath sounds normal. No wheezing, rales or rhonchi. Abdominal: Soft, Palpable mass likely the uterine fibroid to the level of the umbilicus which is nontender, nondistended. Bowel sounds active throughout. No hepatosplenomegaly. Left inguinal hernia which is soft and easily reducible Extremities: no clubbing, cyanosis, or edema Lymphadenopathy: No cervical adenopathy noted. Neurological: Alert and oriented to person place and time. Skin: Skin is warm and dry. No rashes noted. Psychiatric: Normal mood and  affect. Behavior is normal.  CBC    Component Value Date/Time   WBC 6.5 11/24/2015 0542   RBC 3.22 (L) 11/24/2015 0542   HGB 10.7 (L) 11/24/2015 1303   HCT 32.4 (L) 11/24/2015 1303   PLT 251 11/24/2015 0542   MCV 91.3 11/24/2015 0542   MCH 30.1 11/24/2015 0542   MCHC 33.0 11/24/2015 0542   RDW 15.0 11/24/2015 0542   LYMPHSABS  1.6 07/25/2012 0953   MONOABS 0.4 07/25/2012 0953   EOSABS 0.1 07/25/2012 0953   BASOSABS 0.0 07/25/2012 0953   CMP     Component Value Date/Time   NA 136 11/24/2015 0542   K 3.5 11/24/2015 0542   CL 109 11/24/2015 0542   CO2 23 11/24/2015 0542   GLUCOSE 90 11/24/2015 0542   BUN 10 11/24/2015 0542   CREATININE 0.58 11/24/2015 0542   CALCIUM 7.3 (L) 11/24/2015 0542   PROT 5.4 (L) 11/23/2015 0515   ALBUMIN 2.8 (L) 11/23/2015 0515   AST 22 11/23/2015 0515   ALT 15 11/23/2015 0515   ALKPHOS 29 (L) 11/23/2015 0515   BILITOT 0.7 11/23/2015 0515   GFRNONAA >60 11/24/2015 0542   GFRAA >60 11/24/2015 0542   Iron/TIBC/Ferritin/ %Sat    Component Value Date/Time   IRON 20 (L) 11/22/2015 1151   TIBC 335 11/22/2015 1151   FERRITIN 11 11/22/2015 1151   IRONPCTSAT 6 (L) 11/22/2015 1151       Assessment & Plan:  82 year old female with recent gastric cardia ulcer with resultant GI bleed, history of collagenous colitis who is here for follow-up.  1. Microscopic colitis/abdominal bloating and loose stools -- given her previous improvement with higher dose budesonide, her current symptoms are the likely that of microscopic colitis. I have recommended a GI pathogen panel to ensure no concomitant infectious process. I recommended that we return to budesonide 9 mg 4 weeks and then 6 mg daily until follow-up. Have her back in December to see how she is doing. We discussed other options including bismuth subsalicylate but given prior response we will again try budesonide.  2. Gastric ulcer -- healing with no evidence of malignancy. I recommended that she continue pantoprazole 40 mg twice daily given the active gastritis seen recently. Also with budesonide even though it is using mostly eliminated by first-pass metabolism, I would like her to remain on twice a day PPI while on this medication. We did discuss the PPI can exacerbate microscopic colitis but with her history of significant GI bleed I do  not feel comfortable stopping PPI. She understands this rationale.  3. Uterine fibroid -- followed by GYN. I asked that she make Dr. Edwyna Ready aware of the abdominal CT scan which was performed, for other reasons, since her last visit with him.  4. Inguinal hernia, left -- surgical referral for evaluation. This very well may not need surgery but she prefers having a surgical opinion. Currently asymptomatic.  5. Iron deficiency anemia -- repeat CBC and iron studies today to determine the need for additional iron therapy.  40 minutes spent with the patient today. Greater than 50% was spent in counseling and coordination of care with the patient

## 2016-02-06 ENCOUNTER — Other Ambulatory Visit: Payer: Medicare Other

## 2016-02-06 DIAGNOSIS — K4090 Unilateral inguinal hernia, without obstruction or gangrene, not specified as recurrent: Secondary | ICD-10-CM | POA: Diagnosis not present

## 2016-02-06 DIAGNOSIS — K52831 Collagenous colitis: Secondary | ICD-10-CM | POA: Diagnosis not present

## 2016-02-07 ENCOUNTER — Other Ambulatory Visit: Payer: Self-pay

## 2016-02-07 DIAGNOSIS — D508 Other iron deficiency anemias: Secondary | ICD-10-CM

## 2016-02-10 ENCOUNTER — Telehealth: Payer: Self-pay | Admitting: *Deleted

## 2016-02-10 LAB — GASTROINTESTINAL PATHOGEN PANEL PCR
C. DIFFICILE TOX A/B, PCR: NOT DETECTED
CAMPYLOBACTER, PCR: NOT DETECTED
Cryptosporidium, PCR: NOT DETECTED
E COLI 0157, PCR: NOT DETECTED
E coli (ETEC) LT/ST PCR: NOT DETECTED
E coli (STEC) stx1/stx2, PCR: NOT DETECTED
GIARDIA LAMBLIA, PCR: NOT DETECTED
Norovirus, PCR: NOT DETECTED
Rotavirus A, PCR: NOT DETECTED
Salmonella, PCR: NOT DETECTED
Shigella, PCR: NOT DETECTED

## 2016-02-10 NOTE — Telephone Encounter (Signed)
Patient was referred to [facility] for left inguinal hernia. She was scheduled for 02/10/16. However, she cancelled and has rescheduled to 02/20/16 with Dr Gershon Crane.

## 2016-02-20 ENCOUNTER — Ambulatory Visit: Payer: Self-pay | Admitting: Surgery

## 2016-02-20 DIAGNOSIS — K4090 Unilateral inguinal hernia, without obstruction or gangrene, not specified as recurrent: Secondary | ICD-10-CM | POA: Diagnosis not present

## 2016-02-20 NOTE — H&P (Signed)
History of Present Illness Carol Wells. Carol Caples MD; 02/20/2016 12:24 PM) The patient is a 82 year old female who presents with an inguinal hernia. This is a 82 year old female who has had significant recent GI history. She had an ulcer in the gastric cardia with resultant GI bleed. She has a history of collagenous colitis. She had a upper GI bleed in July 2017 as well as chronic active gastritis. She has also been managed for her chronic colitis. She is finally improving from all of these problems. An incidental finding on a CT scan in July of this year showed a a fat containing left inguinal hernia. The patient states that this hernia has become larger and is becoming more uncomfortable. She denies any GI obstructive symptoms. She is concerned since the hernia seems to be enlarging.    CLINICAL DATA: Black tarry stools, history of colitis  EXAM: CT ABDOMEN AND PELVIS WITH CONTRAST  TECHNIQUE: Multidetector CT imaging of the abdomen and pelvis was performed using the standard protocol following bolus administration of intravenous contrast.  CONTRAST: 167mL ISOVUE-300 IOPAMIDOL (ISOVUE-300) INJECTION 61%  COMPARISON: None.  FINDINGS: Lower chest: Lung bases are clear.  Hepatobiliary: 11 mm cyst in the right hepatic dome (series 7/ image 5). 15 mm cyst in the posterior right hepatic lobe (series 7/ image 9). Additional 8 mm cyst in the posterior segment right hepatic lobe (series 7/image 18).  Gallbladder is unremarkable. No intrahepatic or extrahepatic ductal dilatation.  Pancreas: Within normal limits.  Spleen: Within normal limits.  Adrenals/Urinary Tract: Adrenal glands are within normal limits.  6.5 cm cyst in the left upper kidney (series 7/image 14). No enhancing renal lesions. No hydronephrosis.  Bladder is partially obscured by streak artifact but grossly unremarkable.  Stomach/Bowel: Stomach is within normal limits.  No evidence of bowel  obstruction.  Normal appendix (series 2/ image 34).  Sigmoid diverticulosis, without evidence of diverticulitis.  Possible mild wall thickening involving the sigmoid colon (for example, series 2/ image 64), although without convincing inflammatory changes.  Vascular/Lymphatic: No evidence of abdominal aortic aneurysm.  Atherosclerotic calcifications of the abdominal aorta and branch vessels.  No suspicious abdominopelvic lymphadenopathy.  Reproductive: Enlarged uterus with large, partially degenerating fibroid in the uterine fundus (series 2/image 59). Although poorly evaluated on CT and partially obscured by streak artifact, the dominant fundal fibroid measures approximately 11.3 x 13.1 x 9.4 cm with a second large fibroid in the lower uterine body (series 2/ image 62).  No adnexal masses.  Other: No abdominopelvic ascites.  Small fat containing left inguinal hernia (series 2/image 75).  Musculoskeletal: Status post right hip arthroplasty.  Associated bursal fluid on the right (series 2/image 63).  IMPRESSION: Possible mild sigmoid colonic wall thickening, although without convincing inflammatory changes.  Sigmoid diverticulosis, without evidence of diverticulitis.  No evidence of bowel obstruction. Normal appendix.  Large uterine fibroids, measuring up to 13.1 cm, poorly evaluated on CT.  Additional ancillary findings as above.   Electronically Signed By: Julian Hy M.D. On: 11/22/2015 16:28   Other Problems Carol Wells, CMA; 02/20/2016 9:33 AM) Diverticulosis Gastric Ulcer Hemorrhoids High blood pressure Hypercholesterolemia Inguinal Hernia Migraine Headache Transfusion history Ulcerative Colitis  Past Surgical History Carol Wells, CMA; 02/20/2016 9:33 AM) Breast Biopsy Right. Cataract Surgery Bilateral. Colon Polyp Removal - Colonoscopy Foot Surgery Bilateral. Hip Surgery Right.  Diagnostic Studies History Carol Wells, CMA; 02/20/2016 9:33 AM) Colonoscopy 5-10 years ago Mammogram 1-3 years ago Pap Smear >5 years ago  Allergies Carol Wells, [place]; 02/20/2016 9:34  AM) No Known Drug Allergies 02/20/2016  Medication History (Sonya Wells, CMA; 02/20/2016 9:37 AM) Atorvastatin Calcium (10MG  Tablet, Oral) Active. Calcium "900" w/D (Oral) Active. Estradiol (0.05MG /24HR Patch TW, Transdermal) Active. Latanoprost (0.005% Solution, Ophthalmic as needed) Active. Levothyroxine Sodium (100MCG Tablet, Oral) Active. Pantoprazole Sodium (20MG  Tablet DR, Oral as needed) Active. Vitamin C (100MG  Tablet, Oral) Active. Olmesartan Medoxomil (20MG  Tablet, Oral) Active. Aygestin (5MG  Tablet, Oral) Active. Medications Reconciled  Social History Carol Wells, CMA; 02/20/2016 9:33 AM) Alcohol use Occasional alcohol use. Caffeine use Coffee. No drug use Tobacco use Never smoker.  Family History Carol Wells, [place]; 02/20/2016 9:33 AM) Cerebrovascular Accident Father. Colon Cancer Brother. Hypertension Mother.  Pregnancy / Birth History Carol Wells, [place]; 02/20/2016 9:33 AM) Age at menarche 27 years. Age of menopause 51-55 Contraceptive History Oral contraceptives. Gravida 2 Length (months) of breastfeeding 7-12 Maternal age 59-25 Para 2     Review of Systems ([place]; 02/20/2016 9:33 AM) General Not Present- Appetite Loss, Chills, Fatigue, Fever, Night Sweats, Weight Gain and Weight Loss. Skin Not Present- Change in Wart/Mole, Dryness, Hives, Jaundice, New Lesions, Non-Healing Wounds, Rash and Ulcer. HEENT Present- Wears glasses/contact lenses. Not Present- Earache, Hearing Loss, Hoarseness, Nose Bleed, Oral Ulcers, Ringing in the Ears, Seasonal Allergies, Sinus Pain, Sore Throat, Visual Disturbances and Yellow Eyes. Respiratory Not Present- Bloody sputum, Chronic Cough, Difficulty Breathing, Snoring and Wheezing. Breast Not Present- Breast Mass, Breast Pain, Nipple  Discharge and Skin Changes. Cardiovascular Not Present- Chest Pain, Difficulty Breathing Lying Down, Leg Cramps, Palpitations, Rapid Heart Rate, Shortness of Breath and Swelling of Extremities. Gastrointestinal Present- Hemorrhoids. Not Present- Abdominal Pain, Bloating, Bloody Stool, Change in Bowel Habits, Chronic diarrhea, Constipation, Difficulty Swallowing, Excessive gas, Gets full quickly at meals, Indigestion, Nausea, Rectal Pain and Vomiting. Female Genitourinary Not Present- Frequency, Nocturia, Painful Urination, Pelvic Pain and Urgency. Musculoskeletal Not Present- Back Pain, Joint Pain, Joint Stiffness, Muscle Pain, Muscle Weakness and Swelling of Extremities. Neurological Not Present- Decreased Memory, Fainting, Headaches, Numbness, Seizures, Tingling, Tremor, Trouble walking and Weakness. Psychiatric Not Present- Anxiety, Bipolar, Change in Sleep Pattern, Depression, Fearful and Frequent crying. Endocrine Not Present- Cold Intolerance, Excessive Hunger, Hair Changes, Heat Intolerance, Hot flashes and New Diabetes. Hematology Not Present- Blood Thinners, Easy Bruising, Excessive bleeding, Gland problems, HIV and Persistent Infections.  Vitals (Sonya Wells CMA; 02/20/2016 9:34 AM) 02/20/2016 9:34 AM Weight: 132.8 lb Height: 61in Body Surface Area: 1.59 m Body Mass Index: 25.09 kg/m  Temp.: 97.68F(Temporal)  Pulse: 83 (Regular)  BP: 152/70 (Sitting, Left Arm, Standard)      Physical Exam Rodman Key K. Tasharra Nodine MD; 02/20/2016 12:24 PM)  The physical exam findings are as follows: Note:WDWN in NAD Eyes: Pupils equal, round; sclera anicteric HENT: Oral mucosa moist; good dentition Neck: No masses palpated, no thyromegaly Lungs: CTA bilaterally; normal respiratory effort CV: Regular rate and rhythm; no murmurs; extremities well-perfused with no edema Abd: +bowel sounds, soft, non-tender, no palpable organomegaly; no palpable umbilical or ventral hernias Visible left  inguinal hernia - reducible when supine No sign of right inguinal hernia Skin: Warm, dry; no sign of jaundice Psychiatric - alert and oriented x 4; calm mood and affect    Assessment & Plan Rodman Key K. Ogechi Kuehnel MD; 02/20/2016 10:04 AM)  INGUINAL HERNIA OF LEFT SIDE WITHOUT OBSTRUCTION OR GANGRENE (K40.90)  Current Plans Schedule for Surgery - Left inguinal hernia repair with mesh. The surgical procedure has been discussed with the patient. Potential risks, benefits, alternative treatments, and expected outcomes have been explained. All of the  patient's questions at this time have been answered. The likelihood of reaching the patient's treatment goal is good. The patient understand the proposed surgical procedure and wishes to proceed.  Carol Wells. Georgette Dover, MD, [facility]  General/ Trauma Surgery  02/20/2016 12:25 PM

## 2016-02-24 DIAGNOSIS — R3981 Functional urinary incontinence: Secondary | ICD-10-CM | POA: Diagnosis not present

## 2016-02-24 DIAGNOSIS — R3915 Urgency of urination: Secondary | ICD-10-CM | POA: Diagnosis not present

## 2016-02-24 DIAGNOSIS — N3000 Acute cystitis without hematuria: Secondary | ICD-10-CM | POA: Diagnosis not present

## 2016-03-03 NOTE — Pre-Procedure Instructions (Addendum)
Carol Wells  03/03/2016      [facility] [place], [place] - [id number] [place] [address] [place] [address] Phone: [phone] Fax: [phone]  [place] [id number] [place] [address] Phone: [phone] Fax: [phone]    Your procedure is scheduled on Wednesday 03/11/16  Report to [facility] Admitting at 630 A.M.  Call this number if you have problems the morning of surgery:  [phone]   Remember:  Do not eat food or drink liquids after midnight.  Take these medicines the morning of surgery with A SIP OF WATER  Budesonide (entocort),  levothyroxine, pantoprazole (protonix)   (stop 7 says prior to surgery aspirin, ibuprofen/ advil/ motrin, goody powders/ bc's, herbal medicines, multivitamin)   Do not wear jewelry, make-up or nail polish.  Do not wear lotions, powders, or perfumes, or deoderant.  Do not shave 48 hours prior to surgery.  Men may shave face and neck.  Do not bring valuables to the hospital.  [facility] is not responsible for any belongings or valuables.  Contacts, dentures or bridgework may not be worn into surgery.  Leave your suitcase in the car.  After surgery it may be brought to your room.  For patients admitted to the hospital, discharge time will be determined by your treatment team.  Patients discharged the day of surgery will not be allowed to drive home.   Name and phone number of your driver:    Special instructions:  [facility] - Preparing for Surgery  Before surgery, you can play an important role.  Because skin is not sterile, your skin needs to be as free of germs as possible.  You can reduce the number of germs on you skin by washing with CHG (chlorahexidine gluconate) soap before surgery.  CHG is an antiseptic cleaner which kills germs and bonds with the skin to continue killing germs even after washing.  Please DO  NOT use if you have an allergy to CHG or antibacterial soaps.  If your skin becomes reddened/irritated stop using the CHG and inform your nurse when you arrive at Short Stay.  Do not shave (including legs and underarms) for at least 48 hours prior to the first CHG shower.  You may shave your face.  Please follow these instructions carefully:   1.  Shower with CHG Soap the night before surgery and the                                morning of Surgery.  2.  If you choose to wash your hair, wash your hair first as usual with your       normal shampoo.  3.  After you shampoo, rinse your hair and body thoroughly to remove the                      Shampoo.  4.  Use CHG as you would any other liquid soap.  You can apply chg directly       to the skin and wash gently with scrungie or a clean washcloth.  5.  Apply the CHG Soap to your body ONLY FROM THE NECK DOWN.        Do not use on open wounds or open sores.  Avoid contact with your eyes,  ears, mouth and genitals (private parts).  Wash genitals (private parts)       with your normal soap.  6.  Wash thoroughly, paying special attention to the area where your surgery        will be performed.  7.  Thoroughly rinse your body with warm water from the neck down.  8.  DO NOT shower/wash with your normal soap after using and rinsing off       the CHG Soap.  9.  Pat yourself dry with a clean towel.            10.  Wear clean pajamas.            11.  Place clean sheets on your bed the night of your first shower and do not        sleep with pets.  Day of Surgery  Do not apply any lotions/deoderants the morning of surgery.  Please wear clean clothes to the hospital/surgery center.    Please read over the following fact sheets that you were given. Pain Booklet and Surgical Site Infection Prevention

## 2016-03-04 ENCOUNTER — Encounter (HOSPITAL_COMMUNITY): Payer: Self-pay

## 2016-03-04 ENCOUNTER — Encounter (HOSPITAL_COMMUNITY)
Admission: RE | Admit: 2016-03-04 | Discharge: 2016-03-04 | Disposition: A | Discharge status: Home / Self Care | Payer: Medicare Other | Source: Ambulatory Visit | Attending: Surgery | Admitting: Surgery

## 2016-03-04 DIAGNOSIS — Z01812 Encounter for preprocedural laboratory examination: Secondary | ICD-10-CM | POA: Diagnosis not present

## 2016-03-04 DIAGNOSIS — Z01810 Encounter for preprocedural cardiovascular examination: Secondary | ICD-10-CM | POA: Insufficient documentation

## 2016-03-04 HISTORY — DX: Anemia, unspecified: D64.9

## 2016-03-04 LAB — BASIC METABOLIC PANEL
ANION GAP: 5 (ref 5–15)
BUN: 12 mg/dL (ref 6–20)
CHLORIDE: 106 mmol/L (ref 101–111)
CO2: 25 mmol/L (ref 22–32)
CREATININE: 0.74 mg/dL (ref 0.44–1.00)
Calcium: 8.6 mg/dL — ABNORMAL LOW (ref 8.9–10.3)
GFR calc non Af Amer: >60 mL/min (ref >60)
Glucose, Bld: 79 mg/dL (ref 65–99)
POTASSIUM: 4.5 mmol/L (ref 3.5–5.1)
SODIUM: 136 mmol/L (ref 135–145)

## 2016-03-04 LAB — CBC
HCT: 36.1 % (ref 36.0–46.0)
HEMOGLOBIN: 11.3 g/dL — AB (ref 12.0–15.0)
MCH: 25.7 pg — ABNORMAL LOW (ref 26.0–34.0)
MCHC: 31.3 g/dL (ref 30.0–36.0)
MCV: 82.2 fL (ref 78.0–100.0)
Platelets: 340 10*3/uL (ref 150–400)
RBC: 4.39 MIL/uL (ref 3.87–5.11)
RDW: 18.8 % — ABNORMAL HIGH (ref 11.5–15.5)
WBC: 5.9 10*3/uL (ref 4.0–10.5)

## 2016-03-04 NOTE — Progress Notes (Signed)
PCP:Dr. Delphina Cahill  In [facility]  Cardiologist:Dr. Dorris Carnes

## 2016-03-11 ENCOUNTER — Ambulatory Visit (HOSPITAL_COMMUNITY): Payer: Medicare Other | Admitting: Anesthesiology

## 2016-03-11 ENCOUNTER — Encounter (HOSPITAL_COMMUNITY)
Admission: RE | Disposition: A | Discharge status: Home / Self Care | Payer: Self-pay | Source: Ambulatory Visit | Attending: Surgery

## 2016-03-11 ENCOUNTER — Encounter (HOSPITAL_COMMUNITY): Payer: Self-pay | Admitting: *Deleted

## 2016-03-11 ENCOUNTER — Ambulatory Visit (HOSPITAL_COMMUNITY)
Admission: RE | Admit: 2016-03-11 | Discharge: 2016-03-11 | Disposition: A | Discharge status: Home / Self Care | Payer: Medicare Other | Source: Ambulatory Visit | Attending: Surgery | Admitting: Surgery

## 2016-03-11 DIAGNOSIS — M1990 Unspecified osteoarthritis, unspecified site: Secondary | ICD-10-CM | POA: Diagnosis not present

## 2016-03-11 DIAGNOSIS — Z8249 Family history of ischemic heart disease and other diseases of the circulatory system: Secondary | ICD-10-CM | POA: Insufficient documentation

## 2016-03-11 DIAGNOSIS — E039 Hypothyroidism, unspecified: Secondary | ICD-10-CM | POA: Diagnosis not present

## 2016-03-11 DIAGNOSIS — K5190 Ulcerative colitis, unspecified, without complications: Secondary | ICD-10-CM | POA: Diagnosis not present

## 2016-03-11 DIAGNOSIS — K4090 Unilateral inguinal hernia, without obstruction or gangrene, not specified as recurrent: Secondary | ICD-10-CM | POA: Insufficient documentation

## 2016-03-11 DIAGNOSIS — E7800 Pure hypercholesterolemia, unspecified: Secondary | ICD-10-CM | POA: Insufficient documentation

## 2016-03-11 DIAGNOSIS — Z96641 Presence of right artificial hip joint: Secondary | ICD-10-CM | POA: Insufficient documentation

## 2016-03-11 DIAGNOSIS — D649 Anemia, unspecified: Secondary | ICD-10-CM | POA: Diagnosis not present

## 2016-03-11 DIAGNOSIS — I10 Essential (primary) hypertension: Secondary | ICD-10-CM | POA: Insufficient documentation

## 2016-03-11 DIAGNOSIS — K279 Peptic ulcer, site unspecified, unspecified as acute or chronic, without hemorrhage or perforation: Secondary | ICD-10-CM | POA: Diagnosis not present

## 2016-03-11 HISTORY — PX: INSERTION OF MESH: SHX5868

## 2016-03-11 HISTORY — PX: INGUINAL HERNIA REPAIR: SHX194

## 2016-03-11 SURGERY — REPAIR, HERNIA, INGUINAL, ADULT
Anesthesia: General | Site: Inguinal | Laterality: Left

## 2016-03-11 MED ORDER — ONDANSETRON HCL 4 MG/2ML IJ SOLN
INTRAMUSCULAR | Status: DC | PRN
  Administered 2016-03-11: 4 mg via INTRAVENOUS

## 2016-03-11 MED ORDER — PHENYLEPHRINE 40 MCG/ML (10ML) SYRINGE FOR IV PUSH (FOR BLOOD PRESSURE SUPPORT)
PREFILLED_SYRINGE | INTRAVENOUS | Status: AC
  Filled 2016-03-11: qty 10

## 2016-03-11 MED ORDER — HYDROCODONE-ACETAMINOPHEN 5-325 MG PO TABS: 1.0000 | ORAL_TABLET | Freq: Four times a day (QID) | ORAL | 0 refills | Status: DC | PRN

## 2016-03-11 MED ORDER — BUPIVACAINE LIPOSOME 1.3 % IJ SUSP
INTRAMUSCULAR | Status: DC | PRN
  Administered 2016-03-11: 20 mL

## 2016-03-11 MED ORDER — EPHEDRINE SULFATE 50 MG/ML IJ SOLN
INTRAMUSCULAR | Status: DC | PRN
  Administered 2016-03-11: 10 mg via INTRAVENOUS

## 2016-03-11 MED ORDER — SUGAMMADEX SODIUM 200 MG/2ML IV SOLN
INTRAVENOUS | Status: DC | PRN
  Administered 2016-03-11: 150 mg via INTRAVENOUS

## 2016-03-11 MED ORDER — SUGAMMADEX SODIUM 200 MG/2ML IV SOLN
INTRAVENOUS | Status: AC
Start: 2016-03-11 — End: 2016-03-11
  Filled 2016-03-11: qty 2

## 2016-03-11 MED ORDER — BUPIVACAINE HCL (PF) 0.25 % IJ SOLN
INTRAMUSCULAR | Status: AC
  Filled 2016-03-11: qty 30

## 2016-03-11 MED ORDER — PHENYLEPHRINE HCL 10 MG/ML IJ SOLN
INTRAMUSCULAR | Status: DC | PRN
  Administered 2016-03-11 (×2): 80 ug via INTRAVENOUS

## 2016-03-11 MED ORDER — ONDANSETRON HCL 4 MG/2ML IJ SOLN
INTRAMUSCULAR | Status: AC
  Filled 2016-03-11: qty 2

## 2016-03-11 MED ORDER — CHLORHEXIDINE GLUCONATE CLOTH 2 % EX PADS: 6.0000 | MEDICATED_PAD | Freq: Once | CUTANEOUS | Status: DC

## 2016-03-11 MED ORDER — PROPOFOL 10 MG/ML IV BOLUS
INTRAVENOUS | Status: DC | PRN
  Administered 2016-03-11: 100 mg via INTRAVENOUS

## 2016-03-11 MED ORDER — FENTANYL CITRATE (PF) 100 MCG/2ML IJ SOLN
INTRAMUSCULAR | Status: AC
  Filled 2016-03-11: qty 2

## 2016-03-11 MED ORDER — ROCURONIUM BROMIDE 10 MG/ML (PF) SYRINGE
PREFILLED_SYRINGE | INTRAVENOUS | Status: AC
  Filled 2016-03-11: qty 10

## 2016-03-11 MED ORDER — FENTANYL CITRATE (PF) 100 MCG/2ML IJ SOLN
INTRAMUSCULAR | Status: DC | PRN
  Administered 2016-03-11: 50 ug via INTRAVENOUS

## 2016-03-11 MED ORDER — LIDOCAINE 2% (20 MG/ML) 5 ML SYRINGE
INTRAMUSCULAR | Status: AC
  Filled 2016-03-11: qty 5

## 2016-03-11 MED ORDER — ROCURONIUM BROMIDE 100 MG/10ML IV SOLN
INTRAVENOUS | Status: DC | PRN
  Administered 2016-03-11: 35 mg via INTRAVENOUS

## 2016-03-11 MED ORDER — LIDOCAINE HCL (CARDIAC) 20 MG/ML IV SOLN
INTRAVENOUS | Status: DC | PRN
  Administered 2016-03-11: 60 mg via INTRAVENOUS

## 2016-03-11 MED ORDER — PROPOFOL 10 MG/ML IV BOLUS
INTRAVENOUS | Status: AC
  Filled 2016-03-11: qty 20

## 2016-03-11 MED ORDER — LACTATED RINGERS IV SOLN
INTRAVENOUS | Status: DC | PRN
  Administered 2016-03-11: 08:00:00 via INTRAVENOUS

## 2016-03-11 MED ORDER — CEFAZOLIN SODIUM-DEXTROSE 2-4 GM/100ML-% IV SOLN
2.0000 g | INTRAVENOUS | Status: AC
  Administered 2016-03-11: 2 g via INTRAVENOUS
  Filled 2016-03-11: qty 100

## 2016-03-11 SURGICAL SUPPLY — 46 items
BENZOIN TINCTURE PRP APPL 2/3 (GAUZE/BANDAGES/DRESSINGS) ×2 IMPLANT
BLADE SURG 15 STRL LF DISP TIS (BLADE) ×1 IMPLANT
BLADE SURG 15 STRL SS (BLADE) ×1
BLADE SURG ROTATE 9660 (MISCELLANEOUS) ×2 IMPLANT
CHLORAPREP W/TINT 26ML (MISCELLANEOUS) ×2 IMPLANT
CLSR STERI-STRIP ANTIMIC 1/2X4 (GAUZE/BANDAGES/DRESSINGS) ×2 IMPLANT
COVER SURGICAL LIGHT HANDLE (MISCELLANEOUS) ×2 IMPLANT
DRAIN PENROSE 1/2X12 LTX STRL (WOUND CARE) IMPLANT
DRAPE LAPAROSCOPIC ABDOMINAL (DRAPES) IMPLANT
DRAPE LAPAROTOMY TRNSV 102X78 (DRAPE) ×2 IMPLANT
DRAPE UTILITY XL STRL (DRAPES) ×2 IMPLANT
DRSG TEGADERM 4X4.75 (GAUZE/BANDAGES/DRESSINGS) ×2 IMPLANT
ELECT CAUTERY BLADE 6.4 (BLADE) ×2 IMPLANT
ELECT REM PT RETURN 9FT ADLT (ELECTROSURGICAL) ×2
ELECTRODE REM PT RTRN 9FT ADLT (ELECTROSURGICAL) ×1 IMPLANT
GAUZE SPONGE 4X4 12PLY STRL (GAUZE/BANDAGES/DRESSINGS) ×2 IMPLANT
GAUZE SPONGE 4X4 16PLY XRAY LF (GAUZE/BANDAGES/DRESSINGS) ×2 IMPLANT
GLOVE BIO SURGEON STRL SZ7 (GLOVE) ×2 IMPLANT
GLOVE BIOGEL PI IND STRL 7.5 (GLOVE) ×1 IMPLANT
GLOVE BIOGEL PI INDICATOR 7.5 (GLOVE) ×1
GOWN STRL REUS W/ TWL LRG LVL3 (GOWN DISPOSABLE) ×2 IMPLANT
GOWN STRL REUS W/TWL LRG LVL3 (GOWN DISPOSABLE) ×2
KIT BASIN OR (CUSTOM PROCEDURE TRAY) ×2 IMPLANT
KIT ROOM TURNOVER OR (KITS) ×2 IMPLANT
MESH ULTRAPRO 3X6 7.6X15CM (Mesh General) ×2 IMPLANT
NEEDLE HYPO 25GX1X1/2 BEV (NEEDLE) ×2 IMPLANT
NS IRRIG 1000ML POUR BTL (IV SOLUTION) ×2 IMPLANT
PACK SURGICAL SETUP 50X90 (CUSTOM PROCEDURE TRAY) ×2 IMPLANT
PAD ARMBOARD 7.5X6 YLW CONV (MISCELLANEOUS) ×2 IMPLANT
PENCIL BUTTON HOLSTER BLD 10FT (ELECTRODE) ×2 IMPLANT
SPONGE GAUZE 4X4 12PLY STER LF (GAUZE/BANDAGES/DRESSINGS) ×2 IMPLANT
SPONGE INTESTINAL PEANUT (DISPOSABLE) ×2 IMPLANT
STRIP CLOSURE SKIN 1/2X4 (GAUZE/BANDAGES/DRESSINGS) ×2 IMPLANT
SUT MNCRL AB 4-0 PS2 18 (SUTURE) ×2 IMPLANT
SUT PDS AB 0 CT 36 (SUTURE) ×2 IMPLANT
SUT SILK 2 0 SH (SUTURE) IMPLANT
SUT SILK 3 0 (SUTURE) ×1
SUT SILK 3-0 18XBRD TIE 12 (SUTURE) ×1 IMPLANT
SUT VIC AB 0 CT2 27 (SUTURE) ×2 IMPLANT
SUT VIC AB 2-0 SH 27 (SUTURE) ×1
SUT VIC AB 2-0 SH 27X BRD (SUTURE) ×1 IMPLANT
SUT VIC AB 3-0 SH 27 (SUTURE) ×1
SUT VIC AB 3-0 SH 27XBRD (SUTURE) ×1 IMPLANT
SYR CONTROL 10ML LL (SYRINGE) ×2 IMPLANT
TOWEL OR 17X24 6PK STRL BLUE (TOWEL DISPOSABLE) IMPLANT
TOWEL OR 17X26 10 PK STRL BLUE (TOWEL DISPOSABLE) ×2 IMPLANT

## 2016-03-11 NOTE — Transfer of Care (Signed)
Immediate Anesthesia Transfer of Care Note  Patient: Carol Wells  Procedure(s) Performed: Procedure(s): LEFT INGUINAL HERINA REPAIR WITH MESH (Left) INSERTION OF MESH (Left)  Patient Location: PACU  Anesthesia Type:General  Level of Consciousness: awake, alert , oriented and patient cooperative  Airway & Oxygen Therapy: Patient Spontanous Breathing and Patient connected to nasal cannula oxygen  Post-op Assessment: Report given to RN, Post -op Vital signs reviewed and stable and Patient moving all extremities  Post vital signs: Reviewed and stable  Last Vitals:  Vitals:   03/11/16 0706  BP: (!) 171/73  Pulse: 72  Resp: 18  Temp: 36.7 C    Last Pain:  Vitals:   03/11/16 0706  TempSrc: Oral      Patients Stated Pain Goal: 5 ([id number])  Complications: No apparent anesthesia complications

## 2016-03-11 NOTE — Interval H&P Note (Signed)
History and Physical Interval Note:  03/11/2016 7:23 AM  Carol Wells  has presented today for surgery, with the diagnosis of Left inguinal hernia  The various methods of treatment have been discussed with the patient and family. After consideration of risks, benefits and other options for treatment, the patient has consented to  Procedure(s): LEFT INGUINAL HERINA REPAIR WITH MESH (Left) INSERTION OF MESH (Left) as a surgical intervention .  The patient's history has been reviewed, patient examined, no change in status, stable for surgery.  I have reviewed the patient's chart and labs.  Questions were answered to the patient's satisfaction.     Chales Pelissier K.

## 2016-03-11 NOTE — Anesthesia Preprocedure Evaluation (Signed)
Anesthesia Evaluation  Patient identified by MRN, date of birth, ID band Patient awake    Reviewed: Allergy & Precautions, NPO status , Patient's Chart, lab work & pertinent test results  History of Anesthesia Complications Negative for: history of anesthetic complications  Airway Mallampati: II       Dental   Pulmonary neg pulmonary ROS,    breath sounds clear to auscultation       Cardiovascular hypertension,  Rhythm:Regular Rate:Normal     Neuro/Psych negative neurological ROS     GI/Hepatic PUD,   Endo/Other  Hypothyroidism   Renal/GU      Musculoskeletal  (+) Arthritis ,   Abdominal   Peds  Hematology  (+) anemia ,   Anesthesia Other Findings   Reproductive/Obstetrics                             Anesthesia Physical Anesthesia Plan  ASA: III  Anesthesia Plan: General   Post-op Pain Management:    Induction:   Airway Management Planned: Oral ETT  Additional Equipment:   Intra-op Plan:   Post-operative Plan: Extubation in OR  Informed Consent: I have reviewed the patients History and Physical, chart, labs and discussed the procedure including the risks, benefits and alternatives for the proposed anesthesia with the patient or authorized representative who has indicated his/her understanding and acceptance.   Dental advisory given  Plan Discussed with: CRNA  Anesthesia Plan Comments:         Anesthesia Quick Evaluation

## 2016-03-11 NOTE — Anesthesia Postprocedure Evaluation (Signed)
Anesthesia Post Note  Patient: Carol Wells  Procedure(s) Performed: Procedure(s) (LRB): LEFT INGUINAL HERINA REPAIR WITH MESH (Left) INSERTION OF MESH (Left)  Patient location during evaluation: PACU Anesthesia Type: General Level of consciousness: awake and alert Pain management: pain level controlled Vital Signs Assessment: post-procedure vital signs reviewed and stable Respiratory status: spontaneous breathing, nonlabored ventilation, respiratory function stable and patient connected to nasal cannula oxygen Cardiovascular status: blood pressure returned to baseline and stable Postop Assessment: no signs of nausea or vomiting Anesthetic complications: no    Last Vitals:  Vitals:   03/11/16 0945 03/11/16 1017  BP: 133/67   Pulse: 85 83  Resp: (!) 22 20  Temp:      Last Pain:  Vitals:   03/11/16 0706  TempSrc: Oral                 Emalina Dubreuil,JAMES TERRILL

## 2016-03-11 NOTE — Discharge Instructions (Signed)
[facility] ° ° INGUINAL HERNIA REPAIR: POST OP INSTRUCTIONS ° °Always review your discharge instruction sheet given to you by the facility where your surgery was performed. °IF YOU HAVE DISABILITY OR FAMILY LEAVE FORMS, YOU MUST BRING THEM TO THE OFFICE FOR PROCESSING.   °DO NOT GIVE THEM TO YOUR DOCTOR. ° °1. A  prescription for pain medication may be given to you upon discharge.  Take your pain medication as prescribed, if needed.  If narcotic pain medicine is not needed, then you may take acetaminophen (Tylenol) or ibuprofen (Advil) as needed. °2. Take your usually prescribed medications unless otherwise directed. °3. If you need a refill on your pain medication, please contact your pharmacy.  They will contact our office to request authorization. Prescriptions will not be filled after 5 pm or on week-ends. °4. You should follow a light diet the first 24 hours after arrival home, such as soup and crackers, etc.  Be sure to include lots of fluids daily.  Resume your normal diet the day after surgery. °5. Most patients will experience some swelling and bruising around the umbilicus or in the groin and scrotum.  Ice packs and reclining will help.  Swelling and bruising can take several days to resolve.  °6. It is common to experience some constipation if taking pain medication after surgery.  Increasing fluid intake and taking a stool softener (such as Colace) will usually help or prevent this problem from occurring.  A mild laxative (Milk of Magnesia or Miralax) should be taken according to package directions if there are no bowel movements after 48 hours. °7. Unless discharge instructions indicate otherwise, you may remove your bandages 24-48 hours after surgery, and you may shower at that time.  You will have steri-strips (small skin tapes) in place directly over the incision.  These strips should be left on the skin for 7-10 days. °8. ACTIVITIES:  You may resume regular (light) daily activities  beginning the next day--such as daily self-care, walking, climbing stairs--gradually increasing activities as tolerated.  You may have sexual intercourse when it is comfortable.  Refrain from any heavy lifting or straining until approved by your doctor. °a. You may drive when you are no longer taking prescription pain medication, you can comfortably wear a seatbelt, and you can safely maneuver your car and apply brakes. °b. RETURN TO WORK:  2-3 weeks with light duty - no lifting over 15 lbs. °9. You should see your doctor in the office for a follow-up appointment approximately 2-3 weeks after your surgery.  Make sure that you call for this appointment within a day or two after you arrive home to insure a convenient appointment time. °10. OTHER INSTRUCTIONS:  __________________________________________________________________________________________________________________________________________________________________________________________  °WHEN TO CALL YOUR DOCTOR: °1. Fever over 101.0 °2. Inability to urinate °3. Nausea and/or vomiting °4. Extreme swelling or bruising °5. Continued bleeding from incision. °6. Increased pain, redness, or drainage from the incision ° °The clinic staff is available to answer your questions during regular business hours.  Please don’t hesitate to call and ask to speak to one of the nurses for clinical concerns.  If you have a medical emergency, go to the nearest emergency room or call 911.  A surgeon from [facility] is always on call at the hospital ° ° °[address], [address], [place], [place]  [address] ? ° [address] [address], [place], [place]   [address] °[phone]    [phone]    FAX [phone] °Web site:   [email or web address] ° ° °

## 2016-03-11 NOTE — Op Note (Signed)
Hernia, Open, Procedure Note  Indications: The patient presented with a history of a left, reducible inguinal hernia.    Pre-operative Diagnosis: left reducible inguinal hernia Post-operative Diagnosis: same  Surgeon: Maia Petties.   Assistants: none  Anesthesia: General LMA anesthesia  ASA Class: 2  Procedure Details  The patient was seen again in the Holding Room. The risks, benefits, complications, treatment options, and expected outcomes were discussed with the patient. The possibilities of reaction to medication, pulmonary aspiration, perforation of viscus, bleeding, recurrent infection, the need for additional procedures, and development of a complication requiring transfusion or further operation were discussed with the patient and/or family. The likelihood of success in repairing the hernia and returning the patient to their previous functional status is good.  There was concurrence with the proposed plan, and informed consent was obtained. The site of surgery was properly noted/marked. The patient was taken to the Operating Room, identified as Carol Wells, and the procedure verified as left inguinal hernia repair. A Time Out was held and the above information confirmed.  The patient was placed in the supine position and underwent induction of anesthesia. The lower abdomen and groin was prepped with Chloraprep and draped in the standard fashion.  An oblique incision was made over the midportion of left inguinal canal. Dissection was carried down through the subcutaneous tissue with cautery to the external oblique fascia.  There is an obvious split in the external oblique fascia.  We opened the external oblique fascia along the direction of its fibers to the external ring. The floor of the inguinal canal was inspected and showed a small direct defect.  We reduced the hernia sac and closed the floor of the inguinal canal with 0 Vicryl. We used a 3x 6 Ultrapro mesh cut into an oval shape  which was inserted and deployed across the floor of the inguinal canal. The mesh was tucked underneath the external oblique fascia laterally.    The mesh was secured to the pubic tubercle with 0 Vicryl.  Multiple interrupted sutures were used to attach the mesh to the shelving edge inferiorly and the internal oblique fascia superiorly.  The external oblique fascia was reapproximated with 2-0 Vicryl.  We infiltrated 20 ml of Exparel into the fascia and subcutaneous tissue around the incision.  3-0 Vicryl was used to close the subcutaneous tissues and 4-0 Monocryl was used to close the skin in subcuticular fashion.  Benzoin and steri-strips were used to seal the incision.  A clean dressing was applied.  The patient was then extubated and brought to the recovery room in stable condition.  All sponge, instrument, and needle counts were correct prior to closure and at the conclusion of the case.   Estimated Blood Loss: Minimal                 Complications: None; patient tolerated the procedure well.         Disposition: PACU - hemodynamically stable.         Condition: stable  Imogene Burn. Georgette Dover, MD, [facility] Surgery  General/ Trauma Surgery  03/11/2016 9:28 AM

## 2016-03-11 NOTE — H&P (View-Only) (Signed)
History of Present Illness Carol Wells. Yaiden Yang MD; 02/20/2016 12:24 PM) The patient is a 82 year old female who presents with an inguinal hernia. This is a 82 year old female who has had significant recent GI history. She had an ulcer in the gastric cardia with resultant GI bleed. She has a history of collagenous colitis. She had a upper GI bleed in July 2017 as well as chronic active gastritis. She has also been managed for her chronic colitis. She is finally improving from all of these problems. An incidental finding on a CT scan in July of this year showed a a fat containing left inguinal hernia. The patient states that this hernia has become larger and is becoming more uncomfortable. She denies any GI obstructive symptoms. She is concerned since the hernia seems to be enlarging.    CLINICAL DATA: Black tarry stools, history of colitis  EXAM: CT ABDOMEN AND PELVIS WITH CONTRAST  TECHNIQUE: Multidetector CT imaging of the abdomen and pelvis was performed using the standard protocol following bolus administration of intravenous contrast.  CONTRAST: 129mL ISOVUE-300 IOPAMIDOL (ISOVUE-300) INJECTION 61%  COMPARISON: None.  FINDINGS: Lower chest: Lung bases are clear.  Hepatobiliary: 11 mm cyst in the right hepatic dome (series 7/ image 5). 15 mm cyst in the posterior right hepatic lobe (series 7/ image 9). Additional 8 mm cyst in the posterior segment right hepatic lobe (series 7/image 18).  Gallbladder is unremarkable. No intrahepatic or extrahepatic ductal dilatation.  Pancreas: Within normal limits.  Spleen: Within normal limits.  Adrenals/Urinary Tract: Adrenal glands are within normal limits.  6.5 cm cyst in the left upper kidney (series 7/image 14). No enhancing renal lesions. No hydronephrosis.  Bladder is partially obscured by streak artifact but grossly unremarkable.  Stomach/Bowel: Stomach is within normal limits.  No evidence of bowel  obstruction.  Normal appendix (series 2/ image 34).  Sigmoid diverticulosis, without evidence of diverticulitis.  Possible mild wall thickening involving the sigmoid colon (for example, series 2/ image 64), although without convincing inflammatory changes.  Vascular/Lymphatic: No evidence of abdominal aortic aneurysm.  Atherosclerotic calcifications of the abdominal aorta and branch vessels.  No suspicious abdominopelvic lymphadenopathy.  Reproductive: Enlarged uterus with large, partially degenerating fibroid in the uterine fundus (series 2/image 59). Although poorly evaluated on CT and partially obscured by streak artifact, the dominant fundal fibroid measures approximately 11.3 x 13.1 x 9.4 cm with a second large fibroid in the lower uterine body (series 2/ image 62).  No adnexal masses.  Other: No abdominopelvic ascites.  Small fat containing left inguinal hernia (series 2/image 75).  Musculoskeletal: Status post right hip arthroplasty.  Associated bursal fluid on the right (series 2/image 63).  IMPRESSION: Possible mild sigmoid colonic wall thickening, although without convincing inflammatory changes.  Sigmoid diverticulosis, without evidence of diverticulitis.  No evidence of bowel obstruction. Normal appendix.  Large uterine fibroids, measuring up to 13.1 cm, poorly evaluated on CT.  Additional ancillary findings as above.   Electronically Signed By: Julian Hy M.D. On: 11/22/2015 16:28   Other Problems [name], CMA; 02/20/2016 9:33 AM) Diverticulosis Gastric Ulcer Hemorrhoids High blood pressure Hypercholesterolemia Inguinal Hernia Migraine Headache Transfusion history Ulcerative Colitis  Past Surgical History [name], CMA; 02/20/2016 9:33 AM) Breast Biopsy Right. Cataract Surgery Bilateral. Colon Polyp Removal - Colonoscopy Foot Surgery Bilateral. Hip Surgery Right.  Diagnostic Studies History [name], CMA; 02/20/2016 9:33 AM) Colonoscopy 5-10 years ago Mammogram 1-3 years ago Pap Smear >5 years ago  Allergies [name], [place]; 02/20/2016 9:34  AM) No Known Drug Allergies 02/20/2016  Medication History ([name], CMA; 02/20/2016 9:37 AM) Atorvastatin Calcium (10MG  Tablet, Oral) Active. Calcium "900" w/D (Oral) Active. Estradiol (0.05MG /24HR Patch TW, Transdermal) Active. Latanoprost (0.005% Solution, Ophthalmic as needed) Active. Levothyroxine Sodium (100MCG Tablet, Oral) Active. Pantoprazole Sodium (20MG  Tablet DR, Oral as needed) Active. Vitamin C (100MG  Tablet, Oral) Active. Olmesartan Medoxomil (20MG  Tablet, Oral) Active. Aygestin (5MG  Tablet, Oral) Active. Medications Reconciled  Social History [name], CMA; 02/20/2016 9:33 AM) Alcohol use Occasional alcohol use. Caffeine use Coffee. No drug use Tobacco use Never smoker.  Family History [name], [place]; 02/20/2016 9:33 AM) Cerebrovascular Accident Father. Colon Cancer Brother. Hypertension Mother.  Pregnancy / Birth History [name], [place]; 02/20/2016 9:33 AM) Age at menarche 41 years. Age of menopause 51-55 Contraceptive History Oral contraceptives. Gravida 2 Length (months) of breastfeeding 7-12 Maternal age 29-25 Para 2     Review of Systems ([place]; 02/20/2016 9:33 AM) General Not Present- Appetite Loss, Chills, Fatigue, Fever, Night Sweats, Weight Gain and Weight Loss. Skin Not Present- Change in Wart/Mole, Dryness, Hives, Jaundice, New Lesions, Non-Healing Wounds, Rash and Ulcer. HEENT Present- Wears glasses/contact lenses. Not Present- Earache, Hearing Loss, Hoarseness, Nose Bleed, Oral Ulcers, Ringing in the Ears, Seasonal Allergies, Sinus Pain, Sore Throat, Visual Disturbances and Yellow Eyes. Respiratory Not Present- Bloody sputum, Chronic Cough, Difficulty Breathing, Snoring and Wheezing. Breast Not Present- Breast Mass, Breast Pain, Nipple  Discharge and Skin Changes. Cardiovascular Not Present- Chest Pain, Difficulty Breathing Lying Down, Leg Cramps, Palpitations, Rapid Heart Rate, Shortness of Breath and Swelling of Extremities. Gastrointestinal Present- Hemorrhoids. Not Present- Abdominal Pain, Bloating, Bloody Stool, Change in Bowel Habits, Chronic diarrhea, Constipation, Difficulty Swallowing, Excessive gas, Gets full quickly at meals, Indigestion, Nausea, Rectal Pain and Vomiting. Female Genitourinary Not Present- Frequency, Nocturia, Painful Urination, Pelvic Pain and Urgency. Musculoskeletal Not Present- Back Pain, Joint Pain, Joint Stiffness, Muscle Pain, Muscle Weakness and Swelling of Extremities. Neurological Not Present- Decreased Memory, Fainting, Headaches, Numbness, Seizures, Tingling, Tremor, Trouble walking and Weakness. Psychiatric Not Present- Anxiety, Bipolar, Change in Sleep Pattern, Depression, Fearful and Frequent crying. Endocrine Not Present- Cold Intolerance, Excessive Hunger, Hair Changes, Heat Intolerance, Hot flashes and New Diabetes. Hematology Not Present- Blood Thinners, Easy Bruising, Excessive bleeding, Gland problems, HIV and Persistent Infections.  Vitals ([name] CMA; 02/20/2016 9:34 AM) 02/20/2016 9:34 AM Weight: 132.8 lb Height: 61in Body Surface Area: 1.59 m Body Mass Index: 25.09 kg/m  Temp.: 97.50F(Temporal)  Pulse: 83 (Regular)  BP: 152/70 (Sitting, Left Arm, Standard)      Physical Exam Rodman Key K. Maebell Lyvers MD; 02/20/2016 12:24 PM)  The physical exam findings are as follows: Note:WDWN in NAD Eyes: Pupils equal, round; sclera anicteric HENT: Oral mucosa moist; good dentition Neck: No masses palpated, no thyromegaly Lungs: CTA bilaterally; normal respiratory effort CV: Regular rate and rhythm; no murmurs; extremities well-perfused with no edema Abd: +bowel sounds, soft, non-tender, no palpable organomegaly; no palpable umbilical or ventral hernias Visible left  inguinal hernia - reducible when supine No sign of right inguinal hernia Skin: Warm, dry; no sign of jaundice Psychiatric - alert and oriented x 4; calm mood and affect    Assessment & Plan Rodman Key K. Issiac Jamar MD; 02/20/2016 10:04 AM)  INGUINAL HERNIA OF LEFT SIDE WITHOUT OBSTRUCTION OR GANGRENE (K40.90)  Current Plans Schedule for Surgery - Left inguinal hernia repair with mesh. The surgical procedure has been discussed with the patient. Potential risks, benefits, alternative treatments, and expected outcomes have been explained. All of the  patient's questions at this time have been answered. The likelihood of reaching the patient's treatment goal is good. The patient understand the proposed surgical procedure and wishes to proceed.  Carol Wells. Georgette Dover, MD, [facility]  General/ Trauma Surgery  02/20/2016 12:25 PM

## 2016-03-11 NOTE — Anesthesia Procedure Notes (Signed)
Procedure Name: Intubation Date/Time: 03/11/2016 8:35 AM Performed by: Myna Bright Pre-anesthesia Checklist: Patient identified, Emergency Drugs available, Suction available and Patient being monitored Patient Re-evaluated:Patient Re-evaluated prior to inductionOxygen Delivery Method: Circle system utilized Preoxygenation: Pre-oxygenation with 100% oxygen Intubation Type: IV induction Ventilation: Mask ventilation without difficulty Laryngoscope Size: Mac and 3 Grade View: Grade I Tube type: Oral Tube size: 7.0 mm Number of attempts: 1 Airway Equipment and Method: Stylet Placement Confirmation: ETT inserted through vocal cords under direct vision,  positive ETCO2 and breath sounds checked- equal and bilateral Secured at: 20 cm Tube secured with: Tape Dental Injury: Teeth and Oropharynx as per pre-operative assessment

## 2016-03-12 ENCOUNTER — Encounter (HOSPITAL_COMMUNITY): Payer: Self-pay | Admitting: Surgery

## 2016-03-16 DIAGNOSIS — Z23 Encounter for immunization: Secondary | ICD-10-CM | POA: Diagnosis not present

## 2016-04-24 DIAGNOSIS — H401411 Capsular glaucoma with pseudoexfoliation of lens, right eye, mild stage: Secondary | ICD-10-CM | POA: Diagnosis not present

## 2016-04-24 DIAGNOSIS — H401421 Capsular glaucoma with pseudoexfoliation of lens, left eye, mild stage: Secondary | ICD-10-CM | POA: Diagnosis not present

## 2016-04-29 ENCOUNTER — Other Ambulatory Visit (INDEPENDENT_AMBULATORY_CARE_PROVIDER_SITE_OTHER): Payer: Medicare Other

## 2016-04-29 DIAGNOSIS — D508 Other iron deficiency anemias: Secondary | ICD-10-CM

## 2016-04-29 LAB — CBC WITH DIFFERENTIAL/PLATELET
BASOS ABS: 0 10*3/uL (ref 0.0–0.1)
Basophils Relative: 0.7 % (ref 0.0–3.0)
EOS ABS: 0.2 10*3/uL (ref 0.0–0.7)
Eosinophils Relative: 2.2 % (ref 0.0–5.0)
HCT: 36 % (ref 36.0–46.0)
Hemoglobin: 12 g/dL (ref 12.0–15.0)
LYMPHS ABS: 1.6 10*3/uL (ref 0.7–4.0)
Lymphocytes Relative: 22.1 % (ref 12.0–46.0)
MCHC: 33.4 g/dL (ref 30.0–36.0)
MCV: 84.5 fl (ref 78.0–100.0)
MONO ABS: 0.7 10*3/uL (ref 0.1–1.0)
MONOS PCT: 9.4 % (ref 3.0–12.0)
NEUTROS ABS: 4.8 10*3/uL (ref 1.4–7.7)
NEUTROS PCT: 65.6 % (ref 43.0–77.0)
PLATELETS: 292 10*3/uL (ref 150.0–400.0)
RBC: 4.26 Mil/uL (ref 3.87–5.11)
RDW: 18.1 % — ABNORMAL HIGH (ref 11.5–15.5)
WBC: 7.3 10*3/uL (ref 4.0–10.5)

## 2016-04-29 LAB — IBC PANEL
IRON: 104 ug/dL (ref 42–145)
SATURATION RATIOS: 25.2 % (ref 20.0–50.0)
TRANSFERRIN: 295 mg/dL (ref 212.0–360.0)

## 2016-04-29 LAB — FERRITIN: Ferritin: 17.3 ng/mL (ref 10.0–291.0)

## 2016-05-05 ENCOUNTER — Encounter: Payer: Self-pay | Admitting: Internal Medicine

## 2016-05-05 ENCOUNTER — Ambulatory Visit (INDEPENDENT_AMBULATORY_CARE_PROVIDER_SITE_OTHER): Payer: Medicare Other | Admitting: Internal Medicine

## 2016-05-05 VITALS — BP 150/74 | HR 80 | Ht 61.0 in | Wt 137.5 lb

## 2016-05-05 DIAGNOSIS — Z8639 Personal history of other endocrine, nutritional and metabolic disease: Secondary | ICD-10-CM

## 2016-05-05 DIAGNOSIS — K52831 Collagenous colitis: Secondary | ICD-10-CM | POA: Diagnosis not present

## 2016-05-05 DIAGNOSIS — Z8719 Personal history of other diseases of the digestive system: Secondary | ICD-10-CM | POA: Diagnosis not present

## 2016-05-05 DIAGNOSIS — Z8711 Personal history of peptic ulcer disease: Secondary | ICD-10-CM

## 2016-05-05 MED ORDER — PANTOPRAZOLE SODIUM 40 MG PO TBEC: 40.0000 mg | DELAYED_RELEASE_TABLET | Freq: Every day | ORAL | 5 refills | Status: DC

## 2016-05-05 NOTE — Patient Instructions (Signed)
Decrease your Entocort (budesonide) to 3 mg daily x 1 month then discontinue entirely.  Decrease Protonix to once daily dosing. New prescription has been sent to your pharmacy.  Discontinue ferrous sulfate and instead change to a multivitamin that contains iron.  Your physician has requested that you go to the basement for the following lab work on 08/03/16: Ferritin, IBC panel  Please follow up with Dr Hilarie Fredrickson in 6 months.  If you are age 81 or older, your body mass index should be between 23-30. Your Body mass index is 25.98 kg/m. If this is out of the aforementioned range listed, please consider follow up with your Primary Care Provider.  If you are age 38 or younger, your body mass index should be between 19-25. Your Body mass index is 25.98 kg/m. If this is out of the aformentioned range listed, please consider follow up with your Primary Care Provider.

## 2016-05-05 NOTE — Progress Notes (Signed)
Subjective:    Patient ID: [name], female    DOB: [birth year], 83 y.o.   MRN: [id number]  HPI Carol Wells is an 83 year old female with history of gastric ulcer in the cardia resulting in GI bleeding, history of collagenous colitis who is here for follow-up. She is here today with her husband. She was last seen in the office on 02/05/2016. She reports she has been feeling well. She did very well after recent left inguinal hernia repair.  She is continued oral iron replacement and other than causing dark stools denies other GI complaint. No melena or rectal bleeding. Appetite has been good. No abdominal pain. No trouble swallowing. She is continue twice daily pantoprazole. We started budesonide for microscopic colitis in the summer of 2017 and she weaned down to 6 mg daily in October 2017. She remains on 6 mg daily and has not had diarrhea. No nausea, vomiting. Appetite has returned to normal.   Review of Systems As per HPI, otherwise negative  Current Medications, Allergies, Past Medical History, Past Surgical History, Family History and Social History were reviewed in [employer] record.     Objective:   Physical Exam BP (!) 150/74 (BP Location: Left Arm, Patient Position: Sitting, Cuff Size: Normal)   Pulse 80   Ht 5\' 1"  (1.549 m) Comment: height measured without shoes  Wt 137 lb 8 oz (62.4 kg)   BMI 25.98 kg/m  Constitutional: Well-developed and well-nourished. No distress. HEENT: Normocephalic and atraumatic.  Conjunctivae are normal.  No scleral icterus. Neck: Neck supple. Trachea midline. Cardiovascular: irreg, irreg,and intact distal pulses. No M/R/G Pulmonary/chest: Effort normal and breath sounds normal. No wheezing, rales or rhonchi. Abdominal: Soft, nontender, nondistended. Bowel sounds active throughout. Palpable uterine fibroids to below umbilicus Extremities: no clubbing, cyanosis, or edema Neurological: Alert and oriented to person place  and time. Skin: Skin is warm and dry. No rashes noted. Psychiatric: Normal mood and affect. Behavior is normal.  CBC    Component Value Date/Time   WBC 7.3 04/29/2016 1002   RBC 4.26 04/29/2016 1002   HGB 12.0 04/29/2016 1002   HCT 36.0 04/29/2016 1002   PLT 292.0 04/29/2016 1002   MCV 84.5 04/29/2016 1002   MCH 25.7 (L) 03/04/2016 0950   MCHC 33.4 04/29/2016 1002   RDW 18.1 (H) 04/29/2016 1002   LYMPHSABS 1.6 04/29/2016 1002   MONOABS 0.7 04/29/2016 1002   EOSABS 0.2 04/29/2016 1002   BASOSABS 0.0 04/29/2016 1002   Iron/TIBC/Ferritin/ %Sat    Component Value Date/Time   IRON 104 04/29/2016 1002   TIBC 335 11/22/2015 1151   FERRITIN 17.3 04/29/2016 1002   IRONPCTSAT 25.2 04/29/2016 1002       Assessment & Plan:  83 year old female with history of gastric ulcer in the cardia resulting in GI bleeding, history of collagenous colitis who is here for follow-up  1. Gastric ulcer with low iron anemia -- gastric ulcer benign on repeat biopsies and healing at last endoscopy. Iron deficiency anemia has resolved entirely which is very reassuring. Decreased pantoprazole to 40 mg once daily. Avoid NSAIDs. Stop oral iron but I have advised she continue a multivitamin daily containing iron. Repeat CBC and iron studies in 3 months.  2. Microscopic colitis -- symptoms of diarrhea and abdominal bloating have improved with budesonide. Wean budesonide 3 mg today times one month then stop altogether. She is asked to notify me if bloating and loose stools return during wean or after discontinuation of budesonide therapy.  3. Left inguinal hernia -- status post repair and she did extremely well.  Follow-up in 6 months, sooner if necessary 25 minutes spent with the patient today. Greater than 50% was spent in counseling and coordination of care with the patient

## 2016-05-06 ENCOUNTER — Other Ambulatory Visit: Payer: Self-pay

## 2016-05-06 DIAGNOSIS — D509 Iron deficiency anemia, unspecified: Secondary | ICD-10-CM

## 2016-05-10 ENCOUNTER — Other Ambulatory Visit: Payer: Self-pay | Admitting: Internal Medicine

## 2016-06-06 DIAGNOSIS — M961 Postlaminectomy syndrome, not elsewhere classified: Secondary | ICD-10-CM | POA: Diagnosis not present

## 2016-06-06 DIAGNOSIS — M5416 Radiculopathy, lumbar region: Secondary | ICD-10-CM | POA: Diagnosis not present

## 2016-06-06 DIAGNOSIS — M1612 Unilateral primary osteoarthritis, left hip: Secondary | ICD-10-CM | POA: Diagnosis not present

## 2016-06-06 DIAGNOSIS — M7062 Trochanteric bursitis, left hip: Secondary | ICD-10-CM | POA: Diagnosis not present

## 2016-06-19 DIAGNOSIS — Z01419 Encounter for gynecological examination (general) (routine) without abnormal findings: Secondary | ICD-10-CM | POA: Diagnosis not present

## 2016-06-19 DIAGNOSIS — Z1231 Encounter for screening mammogram for malignant neoplasm of breast: Secondary | ICD-10-CM | POA: Diagnosis not present

## 2016-06-19 DIAGNOSIS — Z124 Encounter for screening for malignant neoplasm of cervix: Secondary | ICD-10-CM | POA: Diagnosis not present

## 2016-06-24 DIAGNOSIS — M5416 Radiculopathy, lumbar region: Secondary | ICD-10-CM | POA: Diagnosis not present

## 2016-06-29 DIAGNOSIS — M19012 Primary osteoarthritis, left shoulder: Secondary | ICD-10-CM | POA: Diagnosis not present

## 2016-06-29 DIAGNOSIS — M7541 Impingement syndrome of right shoulder: Secondary | ICD-10-CM | POA: Diagnosis not present

## 2016-07-22 DIAGNOSIS — M5416 Radiculopathy, lumbar region: Secondary | ICD-10-CM | POA: Diagnosis not present

## 2016-07-22 DIAGNOSIS — M961 Postlaminectomy syndrome, not elsewhere classified: Secondary | ICD-10-CM | POA: Diagnosis not present

## 2016-07-22 DIAGNOSIS — M1612 Unilateral primary osteoarthritis, left hip: Secondary | ICD-10-CM | POA: Diagnosis not present

## 2016-07-24 ENCOUNTER — Other Ambulatory Visit: Payer: Self-pay

## 2016-07-24 ENCOUNTER — Telehealth: Payer: Self-pay | Admitting: Internal Medicine

## 2016-07-24 MED ORDER — BUDESONIDE 3 MG PO CPEP: 6.0000 mg | ORAL_CAPSULE | Freq: Every day | ORAL | 2 refills | Status: DC

## 2016-07-24 NOTE — Telephone Encounter (Signed)
Script sent to pharmacy and pt scheduled to see Dr. Hilarie Fredrickson 09/09/16@9am . Pt aware.

## 2016-07-24 NOTE — Progress Notes (Signed)
Pt aware, script sent to the pharmacy. Pt scheduled to see Dr. Hilarie Fredrickson 09/09/16@9am .

## 2016-07-24 NOTE — Telephone Encounter (Signed)
Pyrtle pt with history of microscopic colitis. Last seen 05/05/16 and pt was instructed to ween off of budesonide. She is off now and states that she started having very loose stools again with urgency and some incontinence about 10days ago. Dr. Loletha Carrow as doc of the day please advise.

## 2016-07-24 NOTE — Telephone Encounter (Signed)
Resume budesonide at 6 mg once daily and see Dr Hilarie Fredrickson in about 4-6 weeks. Refill at that dose if needed.

## 2016-08-03 ENCOUNTER — Other Ambulatory Visit: Payer: Self-pay

## 2016-08-03 ENCOUNTER — Other Ambulatory Visit (INDEPENDENT_AMBULATORY_CARE_PROVIDER_SITE_OTHER): Payer: Medicare Other

## 2016-08-03 DIAGNOSIS — D509 Iron deficiency anemia, unspecified: Secondary | ICD-10-CM

## 2016-08-03 LAB — CBC WITH DIFFERENTIAL/PLATELET
BASOS ABS: 0.1 10*3/uL (ref 0.0–0.1)
Basophils Relative: 0.8 % (ref 0.0–3.0)
EOS ABS: 0 10*3/uL (ref 0.0–0.7)
Eosinophils Relative: 0.3 % (ref 0.0–5.0)
HEMATOCRIT: 37 % (ref 36.0–46.0)
HEMOGLOBIN: 12.3 g/dL (ref 12.0–15.0)
LYMPHS PCT: 16.1 % (ref 12.0–46.0)
Lymphs Abs: 1.3 10*3/uL (ref 0.7–4.0)
MCHC: 33.1 g/dL (ref 30.0–36.0)
MCV: 86.9 fl (ref 78.0–100.0)
Monocytes Absolute: 0.4 10*3/uL (ref 0.1–1.0)
Monocytes Relative: 4.2 % (ref 3.0–12.0)
Neutro Abs: 6.6 10*3/uL (ref 1.4–7.7)
Neutrophils Relative %: 78.6 % — ABNORMAL HIGH (ref 43.0–77.0)
PLATELETS: 292 10*3/uL (ref 150.0–400.0)
RBC: 4.25 Mil/uL (ref 3.87–5.11)
RDW: 15.7 % — ABNORMAL HIGH (ref 11.5–15.5)
WBC: 8.4 10*3/uL (ref 4.0–10.5)

## 2016-08-03 LAB — FERRITIN: Ferritin: 11.5 ng/mL (ref 10.0–291.0)

## 2016-08-03 LAB — IBC PANEL
Iron: 41 ug/dL — ABNORMAL LOW (ref 42–145)
SATURATION RATIOS: 9.5 % — AB (ref 20.0–50.0)
TRANSFERRIN: 308 mg/dL (ref 212.0–360.0)

## 2016-08-12 ENCOUNTER — Encounter: Payer: Self-pay | Admitting: Internal Medicine

## 2016-08-23 ENCOUNTER — Emergency Department (HOSPITAL_COMMUNITY): Payer: Medicare Other

## 2016-08-23 ENCOUNTER — Other Ambulatory Visit: Payer: Self-pay | Admitting: Internal Medicine

## 2016-08-23 ENCOUNTER — Encounter (HOSPITAL_COMMUNITY): Payer: Self-pay | Admitting: Emergency Medicine

## 2016-08-23 ENCOUNTER — Emergency Department (HOSPITAL_COMMUNITY)
Admission: EM | Admit: 2016-08-23 | Discharge: 2016-08-23 | Disposition: A | Discharge status: Home / Self Care | Payer: Medicare Other | Attending: Emergency Medicine | Admitting: Emergency Medicine

## 2016-08-23 DIAGNOSIS — Z79899 Other long term (current) drug therapy: Secondary | ICD-10-CM | POA: Insufficient documentation

## 2016-08-23 DIAGNOSIS — E039 Hypothyroidism, unspecified: Secondary | ICD-10-CM | POA: Diagnosis not present

## 2016-08-23 DIAGNOSIS — S40011A Contusion of right shoulder, initial encounter: Secondary | ICD-10-CM | POA: Diagnosis not present

## 2016-08-23 DIAGNOSIS — Y929 Unspecified place or not applicable: Secondary | ICD-10-CM | POA: Insufficient documentation

## 2016-08-23 DIAGNOSIS — M25511 Pain in right shoulder: Secondary | ICD-10-CM | POA: Diagnosis not present

## 2016-08-23 DIAGNOSIS — W010XXA Fall on same level from slipping, tripping and stumbling without subsequent striking against object, initial encounter: Secondary | ICD-10-CM | POA: Diagnosis not present

## 2016-08-23 DIAGNOSIS — Y939 Activity, unspecified: Secondary | ICD-10-CM | POA: Diagnosis not present

## 2016-08-23 DIAGNOSIS — Y999 Unspecified external cause status: Secondary | ICD-10-CM | POA: Diagnosis not present

## 2016-08-23 DIAGNOSIS — I10 Essential (primary) hypertension: Secondary | ICD-10-CM | POA: Diagnosis not present

## 2016-08-23 DIAGNOSIS — S4991XA Unspecified injury of right shoulder and upper arm, initial encounter: Secondary | ICD-10-CM | POA: Diagnosis not present

## 2016-08-23 NOTE — ED Provider Notes (Signed)
[facility] DEPT Provider Note   CSN: [id number] Arrival date & time: 08/23/16  7902     History   Chief Complaint Chief Complaint  Patient presents with  . Fall    HPI Carol Wells is a 83 y.o. female.  The history is provided by the patient.  Fall  This is a new problem. The current episode started yesterday. The problem occurs rarely. The problem has been resolved. Pertinent negatives include no chest pain, no abdominal pain and no shortness of breath. Exacerbated by: pain with movement of right shoulder. Nothing relieves the symptoms. She has tried nothing for the symptoms.   83 year old female who presents with right shoulder pain after mechanical fall yesterday. She has a history of hypertension and hyperlipidemia. She is not anticoagulated. States that she tripped while getting out of her car yesterday afternoon and fell onto her right shoulder. She did not hit her head or have loss of consciousness. She did not injure anything else besides the right shoulder where she has noticed a little bit of bruising and swelling swelling. Her range of motion is limited. She has not tried any medications for pain. She denies any injuries including neck pain, back pain, abdominal pain, chest pain, difficulty breathing, or leg pain. She has been ambulating without difficulty. Past Medical History:  Diagnosis Date  . Anemia   . Arthritis   . Blood transfusion 2017 JULY 22  . Carotid stenosis    Dopplers 4/09: RICA 73-53%, LICA 2-99%  . Cataract    REMOVED  . Chronic headaches    HX MIGRAINES  . Collagenous colitis   . Diverticulosis 2013   Colonoscopy  . Gastric ulcer   . Glaucoma   . Hx of cardiac catheterization    LHC 3/14: no angiographic CAD, EF 65%  . Hx of echocardiogram    Echocardiogram 06/21/12: Mild LVH, EF 24-26%, grade 2 diastolic dysfunction, mild MR  . Hyperlipidemia   . Hypertension   . Hypothyroidism   . Internal hemorrhoids 2009   Colonoscopy  . UTI  (urinary tract infection)     Patient Active Problem List   Diagnosis Date Noted  . Acute blood loss anemia 11/22/2015  . Colitis 11/22/2015  . UGI bleed   . Melena   . History of Helicobacter pylori infection   . Other fatigue   . Weakness   . Collagenous colitis 07/16/2011  . Chronic diarrhea 06/09/2011  . History of peptic ulcer disease 05/08/2011  . Personal history of colonic polyps 05/08/2011  . Family history of malignant neoplasm of gastrointestinal tract 05/08/2011  . Other general symptoms  05/08/2011  . Iron deficiency anemia, unspecified  05/08/2011  . Diarrhea 05/08/2011  . Diverticulosis of colon (without mention of hemorrhage) 05/08/2011    Past Surgical History:  Procedure Laterality Date  . BUNIONECTOMY     Left  . CATARACT EXTRACTION W/ INTRAOCULAR LENS IMPLANT  03/2010   Left  . CATARACT EXTRACTION W/ INTRAOCULAR LENS IMPLANT Bilateral 11/2010  . COLONOSCOPY  2013   NORMAL   . ESOPHAGOGASTRODUODENOSCOPY N/A 11/23/2015   Procedure: ESOPHAGOGASTRODUODENOSCOPY (EGD);  Surgeon: Rogene Houston, MD;  Location: [facility];  Service: Endoscopy;  Laterality: N/A;  . EUS N/A 12/19/2015   Procedure: ESOPHAGEAL ENDOSCOPIC ULTRASOUND (EUS) RADIAL;  Surgeon: Milus Banister, MD;  Location: [facility];  Service: Endoscopy;  Laterality: N/A;  . FUNCTIONAL ENDOSCOPIC SINUS SURGERY    . HAND SURGERY Right 1998  . INGUINAL HERNIA REPAIR Left 03/11/2016  Procedure: LEFT INGUINAL HERINA REPAIR WITH MESH;  Surgeon: Donnie Mesa, MD;  Location: [facility];  Service: General;  Laterality: Left;  . INSERTION OF MESH Left 03/11/2016   Procedure: INSERTION OF MESH;  Surgeon: Donnie Mesa, MD;  Location: [facility];  Service: General;  Laterality: Left;  . LAMINECTOMY  03/2008  . POLYPECTOMY    . TOTAL HIP ARTHROPLASTY  2008   Right    OB History    Gravida Para Term Preterm AB Living   2 2 1 1   2    SAB TAB Ectopic Multiple Live Births                   Home Medications      Prior to Admission medications   Medication Sig Start Date End Date Taking? Authorizing Provider  atorvastatin (LIPITOR) 10 MG tablet TAKE 1 TABLET BY MOUTH DAILY. GENERIC EQUIVALENT FOR LIPITOR Patient taking differently: TAKE 1/2 TABLET BY MOUTH DAILY. GENERIC EQUIVALENT FOR LIPITOR 02/01/14   Fay Records, MD  budesonide (ENTOCORT EC) 3 MG 24 hr capsule Take 3 tablets daily x 4 weeks, then take 2 tablets daily until follow up 02/05/16   Jerene Bears, MD  budesonide (ENTOCORT EC) 3 MG 24 hr capsule Take 2 capsules (6 mg total) by mouth daily. 07/24/16   Nelida Meuse III, MD  Calcium Carbonate-Vitamin D (CALCIUM 600/VITAMIN D PO) Take 1 tablet by mouth daily at 12 noon.     Historical Provider, MD  clobetasol ointment (TEMOVATE) 8.33 % Apply 1 application topically daily. 06/05/14   Historical Provider, MD  estradiol (ESTRACE) 0.5 MG tablet Take 0.5 mg by mouth at bedtime.     Historical Provider, MD  ferrous sulfate 324 (65 Fe) MG TBEC Take 1 tablet by mouth every morning.    Historical Provider, MD  latanoprost (XALATAN) 0.005 % ophthalmic solution Place 1 drop into both eyes at bedtime.      Historical Provider, MD  levothyroxine (SYNTHROID, LEVOTHROID) 125 MCG tablet Take 125 mcg by mouth daily. 05/03/14   Historical Provider, MD  Multiple Vitamin (MULTIVITAMIN) tablet Take 1 tablet by mouth daily at 12 noon.     Historical Provider, MD  norethindrone (AYGESTIN) 5 MG tablet Take 2.5 mg by mouth at bedtime.  05/06/14   Historical Provider, MD  olmesartan (BENICAR) 20 MG tablet Take 20 mg by mouth daily.      Historical Provider, MD  pantoprazole (PROTONIX) 40 MG tablet Take 1 tablet (40 mg total) by mouth daily. 05/05/16   Jerene Bears, MD  pantoprazole (PROTONIX) 40 MG tablet TAKE 1 TABLET BY MOUTH TWO TIMES DAILY BEFORE A MEAL 05/11/16   Jerene Bears, MD  vitamin C (ASCORBIC ACID) 500 MG tablet Take 500 mg by mouth daily at 12 noon.     Historical Provider, MD    Family History Family History   Problem Relation Age of Onset  . Colon cancer Brother   . Colon polyps Brother     Social History Social History  Substance Use Topics  . Smoking status: Never Smoker  . Smokeless tobacco: Never Used  . Alcohol use Yes     Comment: occ     Allergies   Patient has no known allergies.   Review of Systems Review of Systems  Constitutional: Negative for fever.  Respiratory: Negative for shortness of breath.   Cardiovascular: Negative for chest pain.  Gastrointestinal: Negative for abdominal pain.  Genitourinary: Negative for difficulty urinating.  Musculoskeletal: Negative for back pain and neck pain.  Skin: Negative for wound.  Allergic/Immunologic: Negative for immunocompromised state.  Neurological: Negative for weakness and numbness.  Hematological: Does not bruise/bleed easily.  Psychiatric/Behavioral: Negative for confusion.     Physical Exam Updated Vital Signs BP (!) 182/90 (BP Location: Left Arm)   Pulse 79   Temp 98.6 F (37 C) (Oral)   Resp 18   Ht 5\' 1"  (1.549 m)   Wt 132 lb (59.9 kg)   SpO2 100%   BMI 24.94 kg/m   Physical Exam Physical Exam  Nursing note and vitals reviewed. Constitutional: Well developed, well nourished, non-toxic, and in no acute distress Head: Normocephalic and atraumatic.  Mouth/Throat: Oropharynx is clear and moist.  Neck: Normal range of motion. Neck supple.  no cervical spine tenderness Cardiovascular: Normal rate and regular rhythm.   Pulmonary/Chest: Effort normal and breath sounds normal.  no chest wall tenderness Abdominal: Soft. There is no tenderness. There is no rebound and no guarding.  Musculoskeletal: The patient is able to fully flex, extend, abduct the right shoulder but with reported pain. There is mild soft tissue swelling and bruising over the anterior aspect of the right shoulder.  no TLS spine tenderness  Neurological: Alert, no facial droop, fluent speech, moves all extremities symmetrically, intact  innervation involving the radial, ulnar, median, and axillary nerves of the right upper extremity. Skin: Skin is warm and dry.  Psychiatric: Cooperative   ED Treatments / Results  Labs (all labs ordered are listed, but only abnormal results are displayed) Labs Reviewed - No data to display  EKG  EKG Interpretation None       Radiology Dg Shoulder Right  Result Date: 08/23/2016 CLINICAL DATA:  82 year old female with a history of fall and arm pain. EXAM: RIGHT SHOULDER - 2+ VIEW COMPARISON:  None. FINDINGS: Osteopenia. No displaced fracture. Unremarkable scapular Y-view. Glenohumeral joint appears congruent. Likely degenerative changes at the acromioclavicular joint, glenohumeral joint, and at the tip of the coracoid. IMPRESSION: Negative for acute bony abnormality. Electronically Signed   By: Corrie Mckusick D.O.   On: 08/23/2016 10:57   Dg Humerus Right  Result Date: 08/23/2016 CLINICAL DATA:  83 year old female with a history of fall and right arm pain EXAM: RIGHT HUMERUS - 2+ VIEW COMPARISON:  None. FINDINGS: Osteopenia. No displaced fracture. No focal soft tissue swelling. No radiopaque foreign body. Glenohumeral joint appears congruent. IMPRESSION: No acute bony abnormality. Electronically Signed   By: Corrie Mckusick D.O.   On: 08/23/2016 10:55    Procedures Procedures (including critical care time)  Medications Ordered in ED Medications - No data to display   Initial Impression / Assessment and Plan / ED Course  I have reviewed the triage vital signs and the nursing notes.  Pertinent labs & imaging results that were available during my care of the patient were reviewed by me and considered in my medical decision making (see chart for details).     Presenting after mechanical fall with persistent right shoulder pain and swelling. No other injuries noted on exam and by history. Extremity is neurovascularly intact distally. We'll obtain x-rays. Patient states that she does  not need any pain medications. Xray visualized and without fracture. Discussed supportive management for this. Strict return and follow-up instructions reviewed. She expressed understanding of all discharge instructions and felt comfortable with the plan of care.   Final Clinical Impressions(s) / ED Diagnoses   Final diagnoses:  Acute pain of right shoulder  New Prescriptions New Prescriptions   No medications on file     Forde Dandy, MD 08/23/16 1104

## 2016-08-23 NOTE — ED Triage Notes (Signed)
Patient c/o right arm/right shoulder pain after falling yesterday. Per patient tripped on crub and landed on shoulder. Denies hitting head, LOC, or any other pain. Patient has Limited ROM. Per patient swelling in right shoulder is normal for her.

## 2016-08-23 NOTE — Discharge Instructions (Signed)
You do not have fracture on X-ray.  This is likely a shoulder sprain. Ice as needed. Keep arm elevated at rest. Take ibuprofen for pain.   Return for worsening symptoms, including new numbness/weakness, escalating pain, or any other symptoms concerning to you.

## 2016-08-31 ENCOUNTER — Encounter: Payer: Self-pay | Admitting: Internal Medicine

## 2016-08-31 ENCOUNTER — Ambulatory Visit (INDEPENDENT_AMBULATORY_CARE_PROVIDER_SITE_OTHER): Payer: Medicare Other | Admitting: Internal Medicine

## 2016-08-31 VITALS — BP 146/84 | HR 77 | Ht 61.0 in | Wt 137.8 lb

## 2016-08-31 DIAGNOSIS — I10 Essential (primary) hypertension: Secondary | ICD-10-CM | POA: Diagnosis not present

## 2016-08-31 DIAGNOSIS — I5189 Other ill-defined heart diseases: Secondary | ICD-10-CM

## 2016-08-31 DIAGNOSIS — E782 Mixed hyperlipidemia: Secondary | ICD-10-CM

## 2016-08-31 DIAGNOSIS — I519 Heart disease, unspecified: Secondary | ICD-10-CM

## 2016-08-31 NOTE — Patient Instructions (Signed)
Your physician recommends that you continue on your current medications as directed. Please refer to the Current Medication list given to you today. Your physician wants you to follow-up in: 1 year with Dr. Ross.  You will receive a reminder letter in the mail two months in advance. If you don't receive a letter, please call our office to schedule the follow-up appointment.  

## 2016-08-31 NOTE — Progress Notes (Signed)
Date:  08/31/2016   ID:  Carol Wells, DOB 1931-08-17, MRN [id number]  PCP:  Wende Neighbors, MD     Pt presents for f/u of diastolic dysfunction     Patient is an 83 yo with a history of SOB  Echo with Gr II diastolic dsyfunction Mild MR   Stres echo suggested ischemia.  Went on to  Cardiac cath in 06/2012:  No CAD  LVEF 65%.  She also has a history of HTN with white coat effect.  She was last in clinic in 2016  Since then her breathing has been OK  She denies CP  No PND   Says BP is better at home  (white coat effect)  Recent labs :  LDL 72  HDL 49     Wt Readings from Last 3 Encounters:  08/31/16 137 lb 12.8 oz (62.5 kg)  08/23/16 132 lb (59.9 kg)  05/05/16 137 lb 8 oz (62.4 kg)     Past Medical History:  Diagnosis Date  . Anemia   . Arthritis   . Blood transfusion 2017 JULY 22  . Carotid stenosis    Dopplers 9/37: RICA 34-28%, LICA 7-68%  . Cataract    REMOVED  . Chronic headaches    HX MIGRAINES  . Collagenous colitis   . Diverticulosis 2013   Colonoscopy  . Gastric ulcer   . Glaucoma   . Hx of cardiac catheterization    LHC 3/14: no angiographic CAD, EF 65%  . Hx of echocardiogram    Echocardiogram 06/21/12: Mild LVH, EF 11-57%, grade 2 diastolic dysfunction, mild MR  . Hyperlipidemia   . Hypertension   . Hypothyroidism   . Internal hemorrhoids 2009   Colonoscopy  . UTI (urinary tract infection)     Current Outpatient Prescriptions  Medication Sig Dispense Refill  . atorvastatin (LIPITOR) 10 MG tablet TAKE 1 TABLET BY MOUTH DAILY. GENERIC EQUIVALENT FOR LIPITOR (Patient taking differently: TAKE 1/2 TABLET BY MOUTH DAILY. GENERIC EQUIVALENT FOR LIPITOR) 90 tablet 0  . budesonide (ENTOCORT EC) 3 MG 24 hr capsule Take 2 capsules (6 mg total) by mouth daily. 90 capsule 2  . Calcium Carbonate-Vitamin D (CALCIUM 600/VITAMIN D PO) Take 1 tablet by mouth daily at 12 noon.     . clobetasol ointment (TEMOVATE) 2.62 % Apply 1 application topically daily.    Marland Kitchen  estradiol (ESTRACE) 0.5 MG tablet Take 0.5 mg by mouth at bedtime.     . ferrous sulfate 324 (65 Fe) MG TBEC Take 1 tablet by mouth every morning.    . latanoprost (XALATAN) 0.005 % ophthalmic solution Place 1 drop into both eyes at bedtime.      Marland Kitchen levothyroxine (SYNTHROID, LEVOTHROID) 125 MCG tablet Take 125 mcg by mouth daily.    . Multiple Vitamin (MULTIVITAMIN) tablet Take 1 tablet by mouth daily at 12 noon.     . norethindrone (AYGESTIN) 5 MG tablet Take 2.5 mg by mouth at bedtime.     Marland Kitchen olmesartan (BENICAR) 20 MG tablet Take 20 mg by mouth daily.      . pantoprazole (PROTONIX) 40 MG tablet Take 1 tablet (40 mg total) by mouth daily. 30 tablet 5  . vitamin C (ASCORBIC ACID) 500 MG tablet Take 500 mg by mouth daily at 12 noon.      Current Facility-Administered Medications  Medication Dose Route Frequency Provider Last Rate Last Dose  . 0.9 %  sodium chloride infusion  500 mL Intravenous Continuous Lajuan Lines Pyrtle,  MD        Allergies:   No Known Allergies  Social History:  The patient  reports that she has never smoked. She has never used smokeless tobacco. She reports that she drinks alcohol. She reports that she does not use drugs.   ROS:  Please see the history of present illness.   Her husband states she snores and he has witnessed apneic episodes.  She admits to napping often.   All other systems reviewed and negative.   PHYSICAL EXAM: VS:  BP (!) 146/84   Pulse 77   Ht 5\' 1"  (1.549 m)   Wt 137 lb 12.8 oz (62.5 kg)   SpO2 99%   BMI 26.04 kg/m  Well nourished, well developed, in no acute distress HEENT: normal Neck: no JVD Cardiac:  normal S1, S2; RRR; no murmur Lungs:  clear to auscultation bilaterally, no wheezing, rhonchi or rales Abd: soft, nontender, no hepatomegaly Ext: no edema; right groin without hematoma or bruit  Skin: warm and dry Neuro:  CNs 2-12 intact, no focal abnormalities noted  EKG  SR 72  ASSESSMENT AND PLAN:     1    Diastolic dysfunction  Volume  status is OK  No signif symptoms     2  Hypertension:  Pt says she has white coat efffect  Needs to take BP cuff to primary MD to see how accurate it is     3  Hyperlipidemia:  Keep on lipitor    Carotid arteries checked in 2015  Tortuous but normal   Cath in 2014 no evid of CAD    Pt with atherosclerotic calcifications of aorta on CT of abdomen

## 2016-09-09 ENCOUNTER — Ambulatory Visit (INDEPENDENT_AMBULATORY_CARE_PROVIDER_SITE_OTHER): Payer: Medicare Other | Admitting: Internal Medicine

## 2016-09-09 ENCOUNTER — Encounter: Payer: Self-pay | Admitting: Internal Medicine

## 2016-09-09 VITALS — BP 158/70 | HR 76 | Ht 61.0 in | Wt 136.0 lb

## 2016-09-09 DIAGNOSIS — K52831 Collagenous colitis: Secondary | ICD-10-CM | POA: Diagnosis not present

## 2016-09-09 DIAGNOSIS — Z8719 Personal history of other diseases of the digestive system: Secondary | ICD-10-CM

## 2016-09-09 DIAGNOSIS — Z8711 Personal history of peptic ulcer disease: Secondary | ICD-10-CM

## 2016-09-09 MED ORDER — BUDESONIDE 3 MG PO CPEP: 6.0000 mg | ORAL_CAPSULE | Freq: Every day | ORAL | 3 refills | Status: DC

## 2016-09-09 NOTE — Patient Instructions (Signed)
We have sent the following medications to your pharmacy for you to pick up at your convenience:  Entocort  Per Dr. Hilarie Fredrickson, decrease your Entocort to 1 a day;  You may go back up to 2 if you have a flare.  Continue your Pantoprazole

## 2016-09-09 NOTE — Progress Notes (Signed)
Subjective:    Patient ID: Carol Wells, female    DOB: 1932/04/27, 83 y.o.   MRN: [id number]  HPI Carol Wells is an 83 year old female with history of gastric ulcer in the gastric cardia resulting in GI bleeding, history of collagenous colitis who is here for follow-up. She was last seen in January 2018. At that time she was doing well. We attempted to wean her off budesonide. At that time she was taking 6 mg daily, she reduced to 3 mg daily for about 3 weeks and then stop the medication. About 6 weeks after stopping budesonide she reports she had recurrent loose and urgent bowel movements occurring more than 5 times daily. She called the office while I was off and Dr. Loletha Carrow recommended she resume budesonide 6 mg daily. She did this and within 3-5 days her symptoms had completely resolved. She reports that she is having one formed bowel movement per day without urgency or looseness. No constipation. No abdominal pain. She has been eating well and denies nausea and vomiting. She's had no blood in her stool or melena. She has continued pantoprazole 40 mg daily. We have previously reduce this dose when her gastric ulcer had been documented to have mostly healed. Biopsies on this ulcer were benign.  She and her husband recently celebrated their 61st anniversary. They are leaving in a few weeks to visit their son in [place], [place].  Current Medications, Allergies, Past Medical History, Past Surgical History, Family History and Social History were reviewed in [facility] record.  Review of Systems As per history of present illness, otherwise negative  Current Medications, Allergies, Past Medical History, Past Surgical History, Family History and Social History were reviewed in [employer] record.     Objective:   Physical Exam BP (!) 158/70   Pulse 76   Ht 5\' 1"  (1.549 m)   Wt 136 lb (61.7 kg)   BMI 25.70 kg/m  Constitutional: Well-developed and  well-nourished. No distress. HEENT: Normocephalic and atraumatic.  Conjunctivae are normal.  No scleral icterus. Neck: Neck supple. Trachea midline. Cardiovascular: Normal rate, regular rhythm and intact distal pulses.  Pulmonary/chest: Effort normal and breath sounds normal. No wheezing, rales or rhonchi. Abdominal: Soft, nontender, nondistended. Bowel sounds active throughout. Uterine fibroid palpable in the suprapubic area to the level of the umbilicus. No hepatosplenomegaly. Extremities: no clubbing, cyanosis, or edema Neurological: Alert and oriented to person place and time. Skin: Skin is warm and dry. Psychiatric: Normal mood and affect. Behavior is normal.     Assessment & Plan:  83 year old female with history of gastric ulcer in the gastric cardia resulting in GI bleeding, history of collagenous colitis who is here for follow-up.  1. Collagenous colitis -- she had a relapse when budesonide was discontinued. She stated that when taking 3 mg a day her symptoms seem to be in remission. Given this I have asked that she reduced to 3 mg daily if possible. I would rather her take 3 mg than 6 mg daily. She may need this long-term. If symptoms recur on 3 mg daily she is advised to go back to 6 mg daily. She is happy with this plan. We discussed how PPIs can worsen microscopic colitis however given her history of bleeding gastric ulcer this medication is felt necessary.  2. History of gastric ulcer with low iron anemia -- gastric ulcer has been documented to be healed by endoscopy. There is no dysplasia. She will continue pantoprazole 40  mg once daily for ulcer prevention. Avoid NSAIDs.  6-9 month follow-up, sooner if necessary 15 minutes spent with the patient today. Greater than 50% was spent in counseling and coordination of care with the patient

## 2016-10-19 DIAGNOSIS — I10 Essential (primary) hypertension: Secondary | ICD-10-CM | POA: Diagnosis not present

## 2016-10-19 DIAGNOSIS — E039 Hypothyroidism, unspecified: Secondary | ICD-10-CM | POA: Diagnosis not present

## 2016-10-19 DIAGNOSIS — D509 Iron deficiency anemia, unspecified: Secondary | ICD-10-CM | POA: Diagnosis not present

## 2016-10-21 ENCOUNTER — Telehealth: Payer: Self-pay | Admitting: Internal Medicine

## 2016-10-21 DIAGNOSIS — M8580 Other specified disorders of bone density and structure, unspecified site: Secondary | ICD-10-CM | POA: Diagnosis not present

## 2016-10-21 DIAGNOSIS — K273 Acute peptic ulcer, site unspecified, without hemorrhage or perforation: Secondary | ICD-10-CM | POA: Diagnosis not present

## 2016-10-21 DIAGNOSIS — E039 Hypothyroidism, unspecified: Secondary | ICD-10-CM | POA: Diagnosis not present

## 2016-10-21 DIAGNOSIS — Z0000 Encounter for general adult medical examination without abnormal findings: Secondary | ICD-10-CM | POA: Diagnosis not present

## 2016-10-21 DIAGNOSIS — E782 Mixed hyperlipidemia: Secondary | ICD-10-CM | POA: Diagnosis not present

## 2016-10-21 NOTE — Telephone Encounter (Signed)
Pt had labs drawn by Dr. Nevada Crane and she wants to know if she needs to have anything else drawn for upcoming labs for Dr. Hilarie Fredrickson. She had a CBC, CMET, lipid panel, Iron and TIBC, T4, TSH, Ferritin, Transferrin drawn. Results are in your box.Please advise.

## 2016-10-21 NOTE — Telephone Encounter (Signed)
Spoke with pt and she is aware.

## 2016-10-21 NOTE — Telephone Encounter (Signed)
Labs reviewed WBC 5.8, hemoglobin 13.2, MCV 91, platelets 269 CMP within normal limits Ferritin normal at 27 Iron saturation 31% TIBC normal at 303 Transferrin normal at 251  Would not add any additional labs at this time Labs look good

## 2016-10-23 DIAGNOSIS — H401411 Capsular glaucoma with pseudoexfoliation of lens, right eye, mild stage: Secondary | ICD-10-CM | POA: Diagnosis not present

## 2016-11-24 ENCOUNTER — Other Ambulatory Visit: Payer: Self-pay | Admitting: Internal Medicine

## 2017-02-13 DIAGNOSIS — M961 Postlaminectomy syndrome, not elsewhere classified: Secondary | ICD-10-CM | POA: Diagnosis not present

## 2017-02-13 DIAGNOSIS — M5416 Radiculopathy, lumbar region: Secondary | ICD-10-CM | POA: Diagnosis not present

## 2017-02-16 ENCOUNTER — Other Ambulatory Visit: Payer: Self-pay | Admitting: Internal Medicine

## 2017-03-04 DIAGNOSIS — M961 Postlaminectomy syndrome, not elsewhere classified: Secondary | ICD-10-CM | POA: Diagnosis not present

## 2017-03-04 DIAGNOSIS — M5416 Radiculopathy, lumbar region: Secondary | ICD-10-CM | POA: Diagnosis not present

## 2017-03-11 DIAGNOSIS — Z23 Encounter for immunization: Secondary | ICD-10-CM | POA: Diagnosis not present

## 2017-03-18 DIAGNOSIS — M5416 Radiculopathy, lumbar region: Secondary | ICD-10-CM | POA: Diagnosis not present

## 2017-03-23 ENCOUNTER — Telehealth: Payer: Self-pay | Admitting: Internal Medicine

## 2017-03-23 DIAGNOSIS — R42 Dizziness and giddiness: Secondary | ICD-10-CM | POA: Diagnosis not present

## 2017-03-23 DIAGNOSIS — I10 Essential (primary) hypertension: Secondary | ICD-10-CM | POA: Diagnosis not present

## 2017-03-23 NOTE — Telephone Encounter (Signed)
Discussed with pt that she has been on the protonix over a year, did not feel this would be the cause of her dizziness. Pt wants to see if this is the cause. Instructed her to taper off if she decides to do so. Pt reports she will call back to schedule an appt.

## 2017-03-29 ENCOUNTER — Other Ambulatory Visit (HOSPITAL_COMMUNITY): Payer: Self-pay | Admitting: Internal Medicine

## 2017-03-29 ENCOUNTER — Ambulatory Visit (HOSPITAL_COMMUNITY)
Admission: RE | Admit: 2017-03-29 | Discharge: 2017-03-29 | Disposition: A | Discharge status: Home / Self Care | Payer: Medicare Other | Source: Ambulatory Visit | Attending: Internal Medicine | Admitting: Internal Medicine

## 2017-03-29 DIAGNOSIS — R519 Headache, unspecified: Secondary | ICD-10-CM

## 2017-03-29 DIAGNOSIS — R51 Headache: Secondary | ICD-10-CM

## 2017-03-29 DIAGNOSIS — R110 Nausea: Secondary | ICD-10-CM

## 2017-03-29 DIAGNOSIS — I63219 Cerebral infarction due to unspecified occlusion or stenosis of unspecified vertebral arteries: Secondary | ICD-10-CM

## 2017-03-29 DIAGNOSIS — I6389 Other cerebral infarction: Secondary | ICD-10-CM | POA: Diagnosis not present

## 2017-03-29 DIAGNOSIS — I10 Essential (primary) hypertension: Secondary | ICD-10-CM | POA: Diagnosis not present

## 2017-03-29 DIAGNOSIS — R42 Dizziness and giddiness: Secondary | ICD-10-CM | POA: Diagnosis not present

## 2017-03-29 DIAGNOSIS — I639 Cerebral infarction, unspecified: Secondary | ICD-10-CM | POA: Diagnosis not present

## 2017-03-30 ENCOUNTER — Ambulatory Visit (HOSPITAL_COMMUNITY)
Admission: RE | Admit: 2017-03-30 | Discharge: 2017-03-30 | Disposition: A | Discharge status: Home / Self Care | Payer: Medicare Other | Source: Ambulatory Visit | Attending: Internal Medicine | Admitting: Internal Medicine

## 2017-03-30 DIAGNOSIS — M4802 Spinal stenosis, cervical region: Secondary | ICD-10-CM | POA: Diagnosis not present

## 2017-03-30 DIAGNOSIS — I739 Peripheral vascular disease, unspecified: Secondary | ICD-10-CM | POA: Diagnosis not present

## 2017-03-30 DIAGNOSIS — R42 Dizziness and giddiness: Secondary | ICD-10-CM | POA: Diagnosis not present

## 2017-03-30 DIAGNOSIS — I63219 Cerebral infarction due to unspecified occlusion or stenosis of unspecified vertebral arteries: Secondary | ICD-10-CM

## 2017-03-30 DIAGNOSIS — I639 Cerebral infarction, unspecified: Secondary | ICD-10-CM | POA: Diagnosis not present

## 2017-03-30 LAB — POCT I-STAT CREATININE: Creatinine, Ser: 0.7 mg/dL (ref 0.44–1.00)

## 2017-03-30 MED ORDER — GADOBENATE DIMEGLUMINE 529 MG/ML IV SOLN
10.0000 mL | Freq: Once | INTRAVENOUS | Status: AC | PRN
  Administered 2017-03-30: 10 mL via INTRAVENOUS

## 2017-04-06 DIAGNOSIS — H8110 Benign paroxysmal vertigo, unspecified ear: Secondary | ICD-10-CM | POA: Insufficient documentation

## 2017-04-06 DIAGNOSIS — H8112 Benign paroxysmal vertigo, left ear: Secondary | ICD-10-CM | POA: Diagnosis not present

## 2017-06-01 ENCOUNTER — Ambulatory Visit: Payer: Medicare Other | Admitting: Neurology

## 2017-06-17 ENCOUNTER — Ambulatory Visit (INDEPENDENT_AMBULATORY_CARE_PROVIDER_SITE_OTHER): Payer: Medicare Other | Admitting: Internal Medicine

## 2017-06-17 ENCOUNTER — Other Ambulatory Visit (INDEPENDENT_AMBULATORY_CARE_PROVIDER_SITE_OTHER): Payer: Medicare Other

## 2017-06-17 ENCOUNTER — Encounter: Payer: Self-pay | Admitting: Internal Medicine

## 2017-06-17 VITALS — BP 118/70 | HR 74 | Ht 61.0 in | Wt 141.5 lb

## 2017-06-17 DIAGNOSIS — Z8719 Personal history of other diseases of the digestive system: Secondary | ICD-10-CM | POA: Diagnosis not present

## 2017-06-17 DIAGNOSIS — K2970 Gastritis, unspecified, without bleeding: Secondary | ICD-10-CM | POA: Diagnosis not present

## 2017-06-17 DIAGNOSIS — K52839 Microscopic colitis, unspecified: Secondary | ICD-10-CM | POA: Diagnosis not present

## 2017-06-17 DIAGNOSIS — Z8711 Personal history of peptic ulcer disease: Secondary | ICD-10-CM

## 2017-06-17 DIAGNOSIS — Z8639 Personal history of other endocrine, nutritional and metabolic disease: Secondary | ICD-10-CM | POA: Diagnosis not present

## 2017-06-17 DIAGNOSIS — K52831 Collagenous colitis: Secondary | ICD-10-CM

## 2017-06-17 LAB — CBC WITH DIFFERENTIAL/PLATELET
BASOS PCT: 0.4 % (ref 0.0–3.0)
Basophils Absolute: 0 10*3/uL (ref 0.0–0.1)
EOS ABS: 0 10*3/uL (ref 0.0–0.7)
Eosinophils Relative: 0.6 % (ref 0.0–5.0)
HCT: 41.7 % (ref 36.0–46.0)
Hemoglobin: 14.1 g/dL (ref 12.0–15.0)
Lymphocytes Relative: 15.6 % (ref 12.0–46.0)
Lymphs Abs: 1.2 10*3/uL (ref 0.7–4.0)
MCHC: 33.7 g/dL (ref 30.0–36.0)
MCV: 94 fl (ref 78.0–100.0)
MONO ABS: 0.4 10*3/uL (ref 0.1–1.0)
Monocytes Relative: 5.3 % (ref 3.0–12.0)
NEUTROS ABS: 6 10*3/uL (ref 1.4–7.7)
Neutrophils Relative %: 78.1 % — ABNORMAL HIGH (ref 43.0–77.0)
PLATELETS: 296 10*3/uL (ref 150.0–400.0)
RBC: 4.43 Mil/uL (ref 3.87–5.11)
RDW: 13.6 % (ref 11.5–15.5)
WBC: 7.7 10*3/uL (ref 4.0–10.5)

## 2017-06-17 LAB — IBC PANEL
IRON: 112 ug/dL (ref 42–145)
Saturation Ratios: 29.5 % (ref 20.0–50.0)
Transferrin: 271 mg/dL (ref 212.0–360.0)

## 2017-06-17 LAB — VITAMIN B12: Vitamin B-12: 582 pg/mL (ref 211–911)

## 2017-06-17 LAB — FERRITIN: FERRITIN: 20.9 ng/mL (ref 10.0–291.0)

## 2017-06-17 NOTE — Patient Instructions (Signed)
Protonix once daily.   Continue Budesonide as directed.   Continue Iron as directed.   Your physician has requested that you go to the basement for lab work before leaving today.

## 2017-06-18 ENCOUNTER — Encounter: Payer: Self-pay | Admitting: Internal Medicine

## 2017-06-18 NOTE — Progress Notes (Signed)
Subjective:    Patient ID: Carol Wells, female    DOB: 08-Feb-1932, 84 y.o.   MRN: [id number]  HPI Carol Wells is an 84 year old female with a history of gastric ulcer, gastritis, iron deficiency anemia and collagenous colitis who is here for follow-up.  She reports that she has been feeling well but she has been reducing her pantoprazole.  Initially she was concerned that this was causing dizziness but she was later diagnosed with benign paroxysmal positional vertigo and this is been treated and has resolved to this point.  She has weaned pantoprazole to every other day and is now taking this on an every 3-day regimen.  She has not had heartburn, abdominal pain, nausea, trouble eating.  She has also continued oral iron therapy.  From a diarrhea perspective she was weaned down to budesonide 3 mg successfully but occasionally she will have looser stools and will take 6 mg daily for 2-3 days in a row when symptoms will abate.  For the most part stools are formed and occurring only once to twice daily.  No blood in her stool or melena.  No lower abdominal cramping which was an issue prior to treatment of her colitis.   Review of Systems As per HPI, otherwise negative  Current Medications, Allergies, Past Medical History, Past Surgical History, Family History and Social History were reviewed in [employer] record.     Objective:   Physical Exam BP 118/70   Pulse 74   Ht 5\' 1"  (1.549 m)   Wt 141 lb 8 oz (64.2 kg)   BMI 26.74 kg/m  Constitutional: Well-developed and well-nourished. No distress. HEENT: Normocephalic and atraumatic. Oropharynx is clear and moist. Conjunctivae are normal.  No scleral icterus. Neck: Neck supple. Trachea midline. Cardiovascular: Normal rate, regular rhythm and intact distal pulses  Pulmonary/chest: Effort normal and breath sounds normal. No wheezing, rales or rhonchi. Abdominal: Soft, nontender, nondistended. Bowel sounds active  throughout.   Extremities: no clubbing, cyanosis, or edema Neurological: Alert and oriented to person place and time. Skin: Skin is warm and dry. Psychiatric: Normal mood and affect. Behavior is normal.  CBC    Component Value Date/Time   WBC 7.7 06/17/2017 1103   RBC 4.43 06/17/2017 1103   HGB 14.1 06/17/2017 1103   HCT 41.7 06/17/2017 1103   PLT 296.0 06/17/2017 1103   MCV 94.0 06/17/2017 1103   MCH 25.7 (L) 03/04/2016 0950   MCHC 33.7 06/17/2017 1103   RDW 13.6 06/17/2017 1103   LYMPHSABS 1.2 06/17/2017 1103   MONOABS 0.4 06/17/2017 1103   EOSABS 0.0 06/17/2017 1103   BASOSABS 0.0 06/17/2017 1103   CMP     Component Value Date/Time   NA 136 03/04/2016 0950   K 4.5 03/04/2016 0950   CL 106 03/04/2016 0950   CO2 25 03/04/2016 0950   GLUCOSE 79 03/04/2016 0950   BUN 12 03/04/2016 0950   CREATININE 0.70 03/30/2017 1415   CALCIUM 8.6 (L) 03/04/2016 0950   PROT 5.4 (L) 11/23/2015 0515   ALBUMIN 2.8 (L) 11/23/2015 0515   AST 22 11/23/2015 0515   ALT 15 11/23/2015 0515   ALKPHOS 29 (L) 11/23/2015 0515   BILITOT 0.7 11/23/2015 0515   GFRNONAA >60 03/04/2016 0950   GFRAA >60 03/04/2016 0950   Iron/TIBC/Ferritin/ %Sat    Component Value Date/Time   IRON 112 06/17/2017 1103   TIBC 335 11/22/2015 1151   FERRITIN 20.9 06/17/2017 1103   IRONPCTSAT 29.5 06/17/2017 1103  Lab Results  Component Value Date   [id number] 563 06/17/2017       Assessment & Plan:  84 year old female with a history of gastric ulcer, gastritis, iron deficiency anemia and collagenous colitis who is here for follow-up.  1.  History of gastric ulcer with iron deficiency anemia/chronic active gastritis --her ulcer was documented to have healed that she did have ongoing chronic and active gastritis.  We have discussed whether or not she needs PPI therapy and my inclination is to keep her on low-dose PPI because of the active gastritis and her history of gastric ulcer disease leading to bleeding.  We  discussed the risk, benefits and alternatives to chronic PPI therapy and after this discussion she will continue therapy.  I will have her do pantoprazole 40 mg every other day.  I advised that she continue to avoid NSAIDs. --B12 was checked today and normal  2.  History of iron deficiency anemia --iron studies repeated.  Hemoglobin is normal and iron studies have normal I would like her to continue once daily oral iron  3.  Collagenous colitis --successfully weaned to budesonide 3 mg daily.  Occasionally she is needing 6 mg daily.  Attempts to discontinue this has resulted in flare.  For now she will continue 3 mg daily and is okay for 6 mg daily for several days in a row when needed for colitis flare.  Annual follow-up, sooner if needed 25 minutes spent with the patient today. Greater than 50% was spent in counseling and coordination of care with the patient

## 2017-06-21 DIAGNOSIS — L900 Lichen sclerosus et atrophicus: Secondary | ICD-10-CM | POA: Diagnosis not present

## 2017-06-21 DIAGNOSIS — Z01419 Encounter for gynecological examination (general) (routine) without abnormal findings: Secondary | ICD-10-CM | POA: Diagnosis not present

## 2017-06-21 DIAGNOSIS — Z1231 Encounter for screening mammogram for malignant neoplasm of breast: Secondary | ICD-10-CM | POA: Diagnosis not present

## 2017-06-21 DIAGNOSIS — Z124 Encounter for screening for malignant neoplasm of cervix: Secondary | ICD-10-CM | POA: Diagnosis not present

## 2017-06-21 DIAGNOSIS — D259 Leiomyoma of uterus, unspecified: Secondary | ICD-10-CM | POA: Diagnosis not present

## 2017-06-22 DIAGNOSIS — H8110 Benign paroxysmal vertigo, unspecified ear: Secondary | ICD-10-CM | POA: Diagnosis not present

## 2017-06-22 DIAGNOSIS — I10 Essential (primary) hypertension: Secondary | ICD-10-CM | POA: Diagnosis not present

## 2017-06-23 ENCOUNTER — Other Ambulatory Visit: Payer: Self-pay | Admitting: Obstetrics and Gynecology

## 2017-06-23 DIAGNOSIS — D219 Benign neoplasm of connective and other soft tissue, unspecified: Secondary | ICD-10-CM

## 2017-06-23 DIAGNOSIS — R928 Other abnormal and inconclusive findings on diagnostic imaging of breast: Secondary | ICD-10-CM

## 2017-06-28 DIAGNOSIS — H401411 Capsular glaucoma with pseudoexfoliation of lens, right eye, mild stage: Secondary | ICD-10-CM | POA: Diagnosis not present

## 2017-06-29 ENCOUNTER — Ambulatory Visit
Admission: RE | Admit: 2017-06-29 | Discharge: 2017-06-29 | Disposition: A | Discharge status: Home / Self Care | Payer: Medicare Other | Source: Ambulatory Visit | Attending: Obstetrics and Gynecology | Admitting: Obstetrics and Gynecology

## 2017-06-29 DIAGNOSIS — R922 Inconclusive mammogram: Secondary | ICD-10-CM | POA: Diagnosis not present

## 2017-06-29 DIAGNOSIS — R928 Other abnormal and inconclusive findings on diagnostic imaging of breast: Secondary | ICD-10-CM

## 2017-06-29 DIAGNOSIS — N6002 Solitary cyst of left breast: Secondary | ICD-10-CM | POA: Diagnosis not present

## 2017-07-01 ENCOUNTER — Encounter: Payer: Self-pay | Admitting: Radiology

## 2017-07-01 ENCOUNTER — Ambulatory Visit
Admission: RE | Admit: 2017-07-01 | Discharge: 2017-07-01 | Disposition: A | Discharge status: Home / Self Care | Payer: Medicare Other | Source: Ambulatory Visit | Attending: Obstetrics and Gynecology | Admitting: Obstetrics and Gynecology

## 2017-07-01 DIAGNOSIS — D259 Leiomyoma of uterus, unspecified: Secondary | ICD-10-CM | POA: Diagnosis not present

## 2017-07-01 DIAGNOSIS — D219 Benign neoplasm of connective and other soft tissue, unspecified: Secondary | ICD-10-CM

## 2017-07-01 HISTORY — PX: IR RADIOLOGIST EVAL & MGMT: IMG5224

## 2017-07-01 NOTE — Consult Note (Signed)
Chief Complaint:  Enlarging uterine mass in a postmenopausal female, assess for uterine embolization   Referring Physician(s): Taavon,Richard  History of Present Illness: Carol Wells is a 84 y.o. female who is followed closely by her OB/GYN for a large pelvic solitary uterine mass. There has been slow interval enlargement according to patient now up to her umbilicus level. She states her pants are slightly tighter around her waist because of the mass and bulk related symptoms. She is postmenopausal but remains on hormone replacement therapy.  She reports only minor occasional vaginal spotting otherwise no significant postmenopausal bleeding or hemorrhage. She mainly complains of bulk related symptoms with lower abdominal and pelvic pain and pressure as well as urinary frequency. Bowel habits are not affected. She has not had any fibroid surgery or GYN surgery. No recent GYN infections. Last Pap smear according to the patient 3 years ago was negative. She has not had an endometrial biopsy. Imaging of the uterine mass dates back to 2015 at which time she had a pelvic MRI demonstrating a large pelvic uterine mass with some cystic degeneration but enhancing components. Uterine size was estimated at 20 weeks. She is here today to discuss uterine fibroid embolization.  Past Medical History:  Diagnosis Date  . Anemia   . Arthritis   . Blood transfusion 2017 JULY 22  . Carotid stenosis    Dopplers 1/51: RICA 76-16%, LICA 0-73%  . Cataract    REMOVED  . Chronic headaches    HX MIGRAINES  . Collagenous colitis   . Diverticulosis 2013   Colonoscopy  . Gastric ulcer   . Glaucoma   . Hx of cardiac catheterization    LHC 3/14: no angiographic CAD, EF 65%  . Hx of echocardiogram    Echocardiogram 06/21/12: Mild LVH, EF 71-06%, grade 2 diastolic dysfunction, mild MR  . Hyperlipidemia   . Hypertension   . Hypothyroidism   . Internal hemorrhoids 2009   Colonoscopy  . UTI (urinary tract  infection)     Past Surgical History:  Procedure Laterality Date  . BUNIONECTOMY     Left  . CATARACT EXTRACTION W/ INTRAOCULAR LENS IMPLANT  03/2010   Left  . CATARACT EXTRACTION W/ INTRAOCULAR LENS IMPLANT Bilateral 11/2010  . COLONOSCOPY  2013   NORMAL   . ESOPHAGOGASTRODUODENOSCOPY N/A 11/23/2015   Procedure: ESOPHAGOGASTRODUODENOSCOPY (EGD);  Surgeon: Rogene Houston, MD;  Location: [facility];  Service: Endoscopy;  Laterality: N/A;  . EUS N/A 12/19/2015   Procedure: ESOPHAGEAL ENDOSCOPIC ULTRASOUND (EUS) RADIAL;  Surgeon: Milus Banister, MD;  Location: [facility];  Service: Endoscopy;  Laterality: N/A;  . FUNCTIONAL ENDOSCOPIC SINUS SURGERY    . HAND SURGERY Right 1998  . INGUINAL HERNIA REPAIR Left 03/11/2016   Procedure: LEFT INGUINAL HERINA REPAIR WITH MESH;  Surgeon: Donnie Mesa, MD;  Location: [facility];  Service: General;  Laterality: Left;  . INSERTION OF MESH Left 03/11/2016   Procedure: INSERTION OF MESH;  Surgeon: Donnie Mesa, MD;  Location: [facility];  Service: General;  Laterality: Left;  . IR RADIOLOGIST EVAL & MGMT  07/01/2017  . LAMINECTOMY  03/2008  . POLYPECTOMY    . TOTAL HIP ARTHROPLASTY  2008   Right    Allergies: Patient has no known allergies.  Medications: Prior to Admission medications   Medication Sig Start Date End Date Taking? Authorizing Provider  atorvastatin (LIPITOR) 10 MG tablet TAKE 1 TABLET BY MOUTH DAILY. GENERIC EQUIVALENT FOR LIPITOR Patient  taking differently: TAKE 1/2 TABLET BY MOUTH DAILY. GENERIC EQUIVALENT FOR LIPITOR 02/01/14  Yes Fay Records, MD  budesonide (ENTOCORT EC) 3 MG 24 hr capsule Take 2 capsules (6 mg total) by mouth daily. 09/09/16  Yes Pyrtle, Lajuan Lines, MD  Calcium Carbonate-Vitamin D (CALCIUM 600/VITAMIN D PO) Take 1 tablet by mouth daily at 12 noon.    Yes [provider]  clobetasol ointment (TEMOVATE) 9.67 % Apply 1 application topically daily. 06/05/14  Yes [provider]  estradiol (ESTRACE) 0.5 MG  tablet Take 0.5 mg by mouth at bedtime.    Yes [provider]  ferrous sulfate 324 (65 Fe) MG TBEC Take 1 tablet by mouth every morning.   Yes [provider]  latanoprost (XALATAN) 0.005 % ophthalmic solution Place 1 drop into both eyes at bedtime.     Yes [provider]  levothyroxine (SYNTHROID, LEVOTHROID) 125 MCG tablet Take 125 mcg by mouth daily. 05/03/14  Yes [provider]  Multiple Vitamin (MULTIVITAMIN) tablet Take 1 tablet by mouth daily at 12 noon.    Yes [provider]  norethindrone (AYGESTIN) 5 MG tablet Take 2.5 mg by mouth at bedtime.  05/06/14  Yes [provider]  pantoprazole (PROTONIX) 40 MG tablet Take 1 tablet (40 mg total) by mouth daily. Patient taking differently: Take 40 mg by mouth every other day.  05/05/16  Yes Pyrtle, Lajuan Lines, MD  vitamin C (ASCORBIC ACID) 500 MG tablet Take 500 mg by mouth daily at 12 noon.    Yes [provider]  hydrochlorothiazide (MICROZIDE) 12.5 MG capsule Take 12.5 mg by mouth daily.    [provider]  olmesartan (BENICAR) 40 MG tablet Take 40 mg by mouth daily.    [provider]  pantoprazole (PROTONIX) 40 MG tablet TAKE 1 TABLET BY MOUTH TWO TIMES DAILY BEFORE A MEAL - APPOINTMENT IS NEEDED FOR FURTHER REIFLLS 02/16/17   Pyrtle, Lajuan Lines, MD     Family History  Problem Relation Age of Onset  . Colon cancer Brother   . Colon polyps Brother     Social History   Socioeconomic History  . Marital status: Married    Spouse name: Not on file  . Number of children: 2  . Years of education: Not on file  . Highest education level: Not on file  Social Needs  . Financial resource strain: Not on file  . Food insecurity - worry: Not on file  . Food insecurity - inability: Not on file  . Transportation needs - medical: Not on file  . Transportation needs - non-medical: Not on file  Occupational History  . Occupation: Retired Therapist, sports  Tobacco Use  . Smoking status:  Never Smoker  . Smokeless tobacco: Never Used  Substance and Sexual Activity  . Alcohol use: Yes    Comment: occ  . Drug use: No  . Sexual activity: Not on file  Other Topics Concern  . Not on file  Social History Narrative  . Not on file      Review of Systems: A 12 point ROS discussed and pertinent positives are indicated in the HPI above.  All other systems are negative.  Review of Systems  Vital Signs: BP (!) 178/79   Pulse 78   Temp 98.1 F (36.7 C) (Oral)   Resp 14   Ht 5\' 1"  (1.549 m)   Wt 140 lb (63.5 kg)   SpO2 99%   BMI 26.45 kg/m   Physical Exam  Constitutional: She is oriented to person, place, and time. She appears well-developed and well-nourished. No distress.  Eyes: Conjunctivae are normal. No scleral icterus.  Cardiovascular: Normal rate, regular rhythm and intact distal pulses.  Pulmonary/Chest: Effort normal and breath sounds normal. No respiratory distress.  Abdominal: Soft. Bowel sounds are normal. She exhibits mass. There is no tenderness.  Firm palpable nontender mass in the pelvic area extends to the level of the umbilicus compatible with an enlarged uterine mass measuring up to 20 weeks size.  Neurological: She is alert and oriented to person, place, and time.  Skin: Skin is warm and dry. She is not diaphoretic.  Psychiatric: She has a normal mood and affect. Her behavior is normal.     Imaging: US Breast Ltd Uni Left Inc Axilla  Result Date: 06/29/2017 CLINICAL DATA:  Patient presents for additional views of the left breast as followup to a recent screening exam to evaluate a possible mass over the posterior third of the inner lower breast. EXAM: DIGITAL DIAGNOSTIC left MAMMOGRAM WITH TOMO ULTRASOUND left BREAST COMPARISON:  Previous exam(s). ACR Breast Density Category c: The breast tissue is heterogeneously dense, which may obscure small masses. FINDINGS: Spot compression tomographic images demonstrate an oval circumscribed subcentimeter mass  over the inner lower left breast. Targeted ultrasound is performed, showing a cyst over the 8 o'clock position of the left breast 5 cm from the nipple measuring 3 x 3 x 4 mm corresponding to the mammographic abnormality. There is a cluster of small adjacent cysts also with the 8-8:30 position of the left breast 5 cm from the nipple measuring 5 x 6 x 10 mm. IMPRESSION: 4 mm cyst over the 8 o'clock position of the left breast 5 cm from the nipple corresponding to the mammographic abnormality. Cluster of adjacent small cysts at the 8-8:30 position of the left breast as described. RECOMMENDATION: Recommend continued annual bilateral screening mammographic followup. I have discussed the findings and recommendations with the patient. Results were also provided in writing at the conclusion of the visit. If applicable, a reminder letter will be sent to the patient regarding the next appointment. BI-RADS CATEGORY  2: Benign. Electronically Signed   By: Marin Olp M.D.   On: 06/29/2017 10:07   Mm Diag Breast Tomo Uni Left  Result Date: 06/29/2017 CLINICAL DATA:  Patient presents for additional views of the left breast as followup to a recent screening exam to evaluate a possible mass over the posterior third of the inner lower breast. EXAM: DIGITAL DIAGNOSTIC left MAMMOGRAM WITH TOMO ULTRASOUND left BREAST COMPARISON:  Previous exam(s). ACR Breast Density Category c: The breast tissue is heterogeneously dense, which may obscure small masses. FINDINGS: Spot compression tomographic images demonstrate an oval circumscribed subcentimeter mass over the inner lower left breast. Targeted ultrasound is performed, showing a cyst over the 8 o'clock position of the left breast 5 cm from the nipple measuring 3 x 3 x 4 mm corresponding to the mammographic abnormality. There is a cluster of small adjacent cysts also with the 8-8:30 position of the left breast 5 cm from the nipple measuring 5 x 6 x 10 mm. IMPRESSION: 4 mm cyst over  the 8 o'clock position of the left breast 5 cm from the nipple corresponding to the mammographic abnormality. Cluster of adjacent small cysts at the 8-8:30 position of the left breast as described. RECOMMENDATION: Recommend continued annual bilateral screening mammographic followup. I have discussed the findings and recommendations with the patient. Results were also provided in  writing at the conclusion of the visit. If applicable, a reminder letter will be sent to the patient regarding the next appointment. BI-RADS CATEGORY  2: Benign. Electronically Signed   By: Marin Olp M.D.   On: 06/29/2017 10:07   Ir Radiologist Eval & Mgmt  Result Date: 07/01/2017 Please refer to notes tab for details about interventional procedure. (Op Note)   Labs:  CBC: Recent Labs    08/03/16 1247 06/17/17 1103  WBC 8.4 7.7  HGB 12.3 14.1  HCT 37.0 41.7  PLT 292.0 296.0    COAGS: No results for input(s): INR, APTT in the last 8760 hours.  BMP: Recent Labs    03/30/17 1415  CREATININE 0.70    LIVER FUNCTION TESTS: No results for input(s): BILITOT, AST, ALT, ALKPHOS, PROT, ALBUMIN in the last 8760 hours.   Assessment and Plan:  Slowly enlarging solitary uterine mass in an 84 year old postmenopausal female now measuring up to 20 weeks in size at the level of the umbilicus. The uterine fibroid embolization procedure was reviewed in detail with the patient and her husband. At this large of a size, the uterine mass would require a fairly extensive embolization procedure which would result in a prolonged recovery and a high risk of post embolization syndrome. Also, at this large size, embolization may not achieve significant volume reduction to result in any true symptom relief from what she describes as primarily bulk related symptoms and not abnormal bleeding. Also there is still concern that the uterine mass may represent a uterine malignancy. Therefore, I would recommend hysterectomy in this patient  over embolization. After our discussion she has a clear understanding. All questions were addressed. She plans to follow up with her OB/GYN to discuss surgical management.  Thank you for this interesting consult.  I greatly enjoyed meeting BREANA LITTS and look forward to participating in their care.  A copy of this report was sent to the requesting provider on this date.  Electronically Signed: Greggory Keen 07/01/2017, 1:10 PM   I spent a total of  40 Minutes   in face to face in clinical consultation, greater than 50% of which was counseling/coordinating care for this patient with a large pelvic uterine mass.

## 2017-07-12 DIAGNOSIS — I10 Essential (primary) hypertension: Secondary | ICD-10-CM | POA: Diagnosis not present

## 2017-07-28 DIAGNOSIS — M5416 Radiculopathy, lumbar region: Secondary | ICD-10-CM | POA: Diagnosis not present

## 2017-08-10 DIAGNOSIS — M5416 Radiculopathy, lumbar region: Secondary | ICD-10-CM | POA: Diagnosis not present

## 2017-08-12 DIAGNOSIS — D259 Leiomyoma of uterus, unspecified: Secondary | ICD-10-CM | POA: Diagnosis not present

## 2017-08-16 DIAGNOSIS — M79675 Pain in left toe(s): Secondary | ICD-10-CM | POA: Diagnosis not present

## 2017-08-16 DIAGNOSIS — M2042 Other hammer toe(s) (acquired), left foot: Secondary | ICD-10-CM | POA: Diagnosis not present

## 2017-08-16 DIAGNOSIS — M2012 Hallux valgus (acquired), left foot: Secondary | ICD-10-CM | POA: Diagnosis not present

## 2017-08-30 ENCOUNTER — Other Ambulatory Visit (HOSPITAL_COMMUNITY): Payer: Self-pay | Admitting: Obstetrics and Gynecology

## 2017-08-30 DIAGNOSIS — R1900 Intra-abdominal and pelvic swelling, mass and lump, unspecified site: Secondary | ICD-10-CM

## 2017-09-07 ENCOUNTER — Ambulatory Visit (HOSPITAL_COMMUNITY)
Admission: RE | Admit: 2017-09-07 | Discharge: 2017-09-07 | Disposition: A | Discharge status: Home / Self Care | Payer: Medicare Other | Source: Ambulatory Visit | Attending: Obstetrics and Gynecology | Admitting: Obstetrics and Gynecology

## 2017-09-07 DIAGNOSIS — R1900 Intra-abdominal and pelvic swelling, mass and lump, unspecified site: Secondary | ICD-10-CM | POA: Insufficient documentation

## 2017-09-07 DIAGNOSIS — D259 Leiomyoma of uterus, unspecified: Secondary | ICD-10-CM | POA: Diagnosis not present

## 2017-09-07 DIAGNOSIS — K5730 Diverticulosis of large intestine without perforation or abscess without bleeding: Secondary | ICD-10-CM | POA: Diagnosis not present

## 2017-09-07 LAB — POCT I-STAT CREATININE: Creatinine, Ser: 0.7 mg/dL (ref 0.44–1.00)

## 2017-09-07 MED ORDER — GADOBENATE DIMEGLUMINE 529 MG/ML IV SOLN
12.0000 mL | Freq: Once | INTRAVENOUS | Status: AC | PRN
  Administered 2017-09-07: 12 mL via INTRAVENOUS

## 2017-10-05 ENCOUNTER — Other Ambulatory Visit: Payer: Self-pay | Admitting: Obstetrics and Gynecology

## 2017-10-25 DIAGNOSIS — Z79899 Other long term (current) drug therapy: Secondary | ICD-10-CM | POA: Diagnosis not present

## 2017-10-28 ENCOUNTER — Telehealth: Payer: Self-pay | Admitting: *Deleted

## 2017-10-28 DIAGNOSIS — Z0000 Encounter for general adult medical examination without abnormal findings: Secondary | ICD-10-CM | POA: Diagnosis not present

## 2017-10-28 DIAGNOSIS — Z6826 Body mass index (BMI) 26.0-26.9, adult: Secondary | ICD-10-CM | POA: Diagnosis not present

## 2017-10-28 DIAGNOSIS — D259 Leiomyoma of uterus, unspecified: Secondary | ICD-10-CM | POA: Diagnosis not present

## 2017-10-28 DIAGNOSIS — M8580 Other specified disorders of bone density and structure, unspecified site: Secondary | ICD-10-CM | POA: Diagnosis not present

## 2017-10-28 DIAGNOSIS — E039 Hypothyroidism, unspecified: Secondary | ICD-10-CM | POA: Diagnosis not present

## 2017-10-28 DIAGNOSIS — R32 Unspecified urinary incontinence: Secondary | ICD-10-CM | POA: Diagnosis not present

## 2017-10-28 DIAGNOSIS — I10 Essential (primary) hypertension: Secondary | ICD-10-CM | POA: Diagnosis not present

## 2017-10-28 DIAGNOSIS — R0602 Shortness of breath: Secondary | ICD-10-CM | POA: Diagnosis not present

## 2017-10-28 NOTE — Telephone Encounter (Signed)
Patient called and scheduled new appt for Dr. Denman George on 7/3 at 11:15am arrive at 11am

## 2017-10-28 NOTE — Telephone Encounter (Signed)
Called and left the patient a message to call the office back to schedule an appt

## 2017-11-03 ENCOUNTER — Encounter: Payer: Self-pay | Admitting: Gynecologic Oncology

## 2017-11-03 ENCOUNTER — Inpatient Hospital Stay: Payer: Medicare Other | Attending: Gynecologic Oncology | Admitting: Gynecologic Oncology

## 2017-11-03 VITALS — BP 164/68 | HR 81 | Temp 97.7°F | Resp 20 | Ht 61.0 in | Wt 137.5 lb

## 2017-11-03 DIAGNOSIS — D219 Benign neoplasm of connective and other soft tissue, unspecified: Secondary | ICD-10-CM | POA: Insufficient documentation

## 2017-11-03 DIAGNOSIS — D259 Leiomyoma of uterus, unspecified: Secondary | ICD-10-CM | POA: Diagnosis not present

## 2017-11-03 DIAGNOSIS — N858 Other specified noninflammatory disorders of uterus: Secondary | ICD-10-CM | POA: Diagnosis not present

## 2017-11-03 NOTE — H&P (View-Only) (Signed)
Consult Note: Gyn-Onc  Consult was requested by Dr. Ronita Hipps for the evaluation of Carol Wells 84 y.o. female  CC:  Chief Complaint  Patient presents with  . Fibroids    Enlarged Fibroids    Assessment/Plan:  Ms. Carol Wells  is a 84 y.o.  year old with symptomatic 20+cm fibroid uterus.  There has been subtle growth in the fibroid's size over the past 5 years, though this growth is subtle enough that it is unlikely to be LMS. Additionally, it's MRI characteristics are more consistent with a degenerating fibroid. The fibroid is mobile and mostly abdominal rather than pelvic, therefore I feel that she is a good candidate for a robotic assisted total hysterectomy >250gm, BSO with minilaparotomy for specimen delivery (via low transverse incision).  I discussed that an alternative would be no surgery, and just expectant follow-up. However, given the poor quality of life associated with urinary frequency, this is not my preferred recommendation.  I discussed operative risks including  bleeding, infection, damage to internal organs (such as bladder,ureters, bowels), blood clot, reoperation and rehospitalization.  All questions were answered including anticipated recovery and hospital stay.  The patient is electing to proceed with robotic assisted total hysterectomy >250gm, BSO and minilaparotomy. This has been scheduled for later this month.  Postop she will be able to stop progesterone.   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  The patient has had a known fibroid uterus for many years. It has gradually grown in size and become increasingly symptomatic with urinary frequency.  She had an MRI in 2015 which showed a uterus measuring 13.8x10.5x11.5cm. CT scan in 2017 showed some interval growth (largest dimension 21cm). Repeat MRI on 09/07/17 showed a uterus measuring  21.1x12.2x15.1cm with a large fibroid again seen in the anterior corpus and fundus which shows heterogeneous contrast enhancement and areas of cystic degeneration. This measures 17.1x11.5x14.0cm and has not significantly changed in size since 2017 CT. Although it does show increased size compared to earlier MRI in 2015. This mass continues to show well-circumscribed margins without evidence of invasion of adjacent structures. Endometrium is not visualized due to compression by the large fibroid. Cervix is normal.  She consulted with IR regarding embolization, but they felt this was contraindicated due to fibroid size and her age.  She was seen at [facility] for an opinion regarding MIS surgery, however, after reviewing the MRI, they felt that it should be managed by an oncologic surgeon.  Darely reports intermittent vaginal spotting for many years. It is occasional (approximately every 6 months). She has urinary frequency. She is otherwise very healthy with good activity levels. She has had 2 vaginal deliveries and no prior abdominal surgeries.   Current Meds:  Outpatient Encounter Medications as of 11/03/2017  Medication Sig  . atorvastatin (LIPITOR) 10 MG tablet TAKE 1 TABLET BY MOUTH DAILY. GENERIC EQUIVALENT FOR LIPITOR (Patient taking differently: TAKE 1/2 TABLET BY MOUTH DAILY. GENERIC EQUIVALENT FOR LIPITOR)  . budesonide (ENTOCORT EC) 3 MG 24 hr capsule Take 2 capsules (6 mg total) by mouth daily. (Patient taking differently: Take 6 mg by mouth daily. Take one capsule by mouth daily.)  . Calcium Carbonate-Vitamin D (CALCIUM 600/VITAMIN D PO) Take 1 tablet by mouth daily at 12 noon.   . clobetasol ointment (TEMOVATE) 9.50 % Apply 1 application topically as needed.   Marland Kitchen estradiol (ESTRACE)  0.5 MG tablet Take 0.5 mg by mouth at bedtime.   . ferrous sulfate 324 (65 Fe) MG TBEC Take 1 tablet by mouth every morning.  . latanoprost (XALATAN) 0.005 % ophthalmic solution Place 1 drop into both eyes at  bedtime.    Marland Kitchen levothyroxine (SYNTHROID, LEVOTHROID) 125 MCG tablet Take 125 mcg by mouth daily.  Marland Kitchen losartan-hydrochlorothiazide (HYZAAR) 100-12.5 MG tablet Take 1 tablet by mouth daily.  . Multiple Vitamin (MULTIVITAMIN) tablet Take 1 tablet by mouth daily at 12 noon.   . norethindrone (AYGESTIN) 5 MG tablet Take 2.5 mg by mouth at bedtime.   . pantoprazole (PROTONIX) 40 MG tablet Take 1 tablet (40 mg total) by mouth daily. (Patient taking differently: Take 40 mg by mouth every other day. )  . vitamin C (ASCORBIC ACID) 500 MG tablet Take 500 mg by mouth daily at 12 noon.   . [DISCONTINUED] hydrochlorothiazide (MICROZIDE) 12.5 MG capsule Take 12.5 mg by mouth daily.  . [DISCONTINUED] olmesartan (BENICAR) 40 MG tablet Take 40 mg by mouth daily.  . [DISCONTINUED] pantoprazole (PROTONIX) 40 MG tablet TAKE 1 TABLET BY MOUTH TWO TIMES DAILY BEFORE A MEAL - APPOINTMENT IS NEEDED FOR FURTHER REIFLLS (Patient not taking: Reported on 11/03/2017)   No facility-administered encounter medications on file as of 11/03/2017.     Allergy: No Known Allergies  Social Hx:   Social History   Socioeconomic History  . Marital status: Married    Spouse name: Not on file  . Number of children: 2  . Years of education: Not on file  . Highest education level: Not on file  Occupational History  . Occupation: Retired Animal nutritionist  . Financial resource strain: Not on file  . Food insecurity:    Worry: Not on file    Inability: Not on file  . Transportation needs:    Medical: Not on file    Non-medical: Not on file  Tobacco Use  . Smoking status: Never Smoker  . Smokeless tobacco: Never Used  Substance and Sexual Activity  . Alcohol use: Yes    Comment: occ  . Drug use: No  . Sexual activity: Not on file  Lifestyle  . Physical activity:    Days per week: Not on file    Minutes per session: Not on file  . Stress: Not on file  Relationships  . Social connections:    Talks on phone: Not on file     Gets together: Not on file    Attends religious service: Not on file    Active member of club or organization: Not on file    Attends meetings of clubs or organizations: Not on file    Relationship status: Not on file  . Intimate partner violence:    Fear of current or ex partner: Not on file    Emotionally abused: Not on file    Physically abused: Not on file    Forced sexual activity: Not on file  Other Topics Concern  . Not on file  Social History Narrative  . Not on file    Past Surgical Hx:  Past Surgical History:  Procedure Laterality Date  . BUNIONECTOMY     Left  . CATARACT EXTRACTION W/ INTRAOCULAR LENS IMPLANT  03/2010   Left  . CATARACT EXTRACTION W/ INTRAOCULAR LENS IMPLANT Bilateral 11/2010  . COLONOSCOPY  2013   NORMAL   . ESOPHAGOGASTRODUODENOSCOPY N/A 11/23/2015   Procedure: ESOPHAGOGASTRODUODENOSCOPY (EGD);  Surgeon: Rogene Houston, MD;  [facility]  [facility];  Service: Endoscopy;  Laterality: N/A;  . EUS N/A 12/19/2015   Procedure: ESOPHAGEAL ENDOSCOPIC ULTRASOUND (EUS) RADIAL;  Surgeon: Milus Banister, MD;  Location: [facility];  Service: Endoscopy;  Laterality: N/A;  . FUNCTIONAL ENDOSCOPIC SINUS SURGERY    . HAND SURGERY Right 1998  . INGUINAL HERNIA REPAIR Left 03/11/2016   Procedure: LEFT INGUINAL HERINA REPAIR WITH MESH;  Surgeon: Donnie Mesa, MD;  Location: [facility];  Service: General;  Laterality: Left;  . INSERTION OF MESH Left 03/11/2016   Procedure: INSERTION OF MESH;  Surgeon: Donnie Mesa, MD;  Location: [facility];  Service: General;  Laterality: Left;  . IR RADIOLOGIST EVAL & MGMT  07/01/2017  . LAMINECTOMY  03/2008  . POLYPECTOMY    . TOTAL HIP ARTHROPLASTY  2008   Right    Past Medical Hx:  Past Medical History:  Diagnosis Date  . Anemia   . Arthritis   . Blood transfusion 2017 JULY 22  . Carotid stenosis    Dopplers 0/25: RICA 42-70%, LICA 6-23%  . Cataract    REMOVED  . Chronic headaches    HX MIGRAINES  . Collagenous colitis    . Diverticulosis 2013   Colonoscopy  . Gastric ulcer   . Glaucoma   . Hx of cardiac catheterization    LHC 3/14: no angiographic CAD, EF 65%  . Hx of echocardiogram    Echocardiogram 06/21/12: Mild LVH, EF 76-28%, grade 2 diastolic dysfunction, mild MR  . Hyperlipidemia   . Hypertension   . Hypothyroidism   . Internal hemorrhoids 2009   Colonoscopy  . UTI (urinary tract infection)     Past Gynecological History:  No abnormal paps, 2xSVD No LMP recorded. Patient is postmenopausal.  Family Hx:  Family History  Problem Relation Age of Onset  . Colon cancer Brother   . Colon polyps Brother     Review of Systems:  Constitutional  Feels well,    ENT Normal appearing ears and nares bilaterally Skin/Breast  No rash, sores, jaundice, itching, dryness Cardiovascular  No chest pain, shortness of breath, or edema  Pulmonary  No cough or wheeze.  Gastro Intestinal  No nausea, vomitting, or diarrhoea. No bright red blood per rectum, no abdominal pain, change in bowel movement, or constipation.  Genito Urinary  + frequency, no urgency or dysuria,  Musculo Skeletal  No myalgia, arthralgia, joint swelling or pain  Neurologic  No weakness, numbness, change in gait,  Psychology  No depression, anxiety, insomnia.   Vitals:  Blood pressure (!) 164/68, pulse 81, temperature 97.7 F (36.5 C), temperature source Oral, resp. rate 20, height 5\' 1"  (1.549 m), weight 137 lb 8 oz (62.4 kg), SpO2 96 %.  Physical Exam: WD in NAD Neck  Supple NROM, without any enlargements.  Lymph Node Survey No cervical supraclavicular or inguinal adenopathy Cardiovascular  Pulse normal rate, regularity and rhythm. S1 and S2 normal.  Lungs  Clear to auscultation bilateraly, without wheezes/crackles/rhonchi. Good air movement.  Skin  No rash/lesions/breakdown  Psychiatry  Alert and oriented to person, place, and time  Abdomen  Normoactive bowel sounds, abdomen soft, non-tender and thin without  evidence of hernia. Abdominal fibroid palpable to level of umbilicus - mobile. Back No CVA tenderness Genito Urinary  Vulva/vagina: Normal external female genitalia.  No lesions. No discharge or bleeding.  Bladder/urethra:  No lesions or masses, well supported bladder  Vagina: normal, some posterior prolapse.  Cervix: Normal appearing, no lesions.  Uterus:  20+cm, mobile  with good mobility either side.   Adnexa: no discrete masses. Rectal  Good tone, no masses no cul de sac nodularity.  Extremities  No bilateral cyanosis, clubbing or edema.   PROCEDURE NOTE: ENDOMETRIAL BIOPSY preop dx: postmenopausal bleeding preop dx: same Procedure: endometrial biopsy Pathology: endometrial biopsy to pathology Surgeon: Everitt Amber EBL: minimal Patient provided verbal consent. Time out performed Cervix visualized with speculum, grasped with tenaculum. Os finder used. Pipelle passed to its full length and aspirated with 2 passes.  Tolerated procedure well.   Thereasa Solo, MD  11/03/2017, 12:52 PM

## 2017-11-03 NOTE — Patient Instructions (Signed)
Preparing for your Surgery  Plan for surgery with Dr. Everitt Amber at [facility] will be scheduled for a robotic assisted total hysterectomy, bilateral salpingo-oophorectomy, mini-laparotomy for specimen delivery.    Potential OR dates include: July 11, July 18, July 30, August 6, August 7, August 8.  Please call our office at [phone] when you have decided on a date.  Pre-operative Testing -You will receive a phone call from presurgical testing at [facility] to arrange for a pre-operative testing appointment before your surgery.  This appointment normally occurs one to two weeks before your scheduled surgery.   -Bring your insurance card, copy of an advanced directive if applicable, medication list  -At that visit, you will be asked to sign a consent for a possible blood transfusion in case a transfusion becomes necessary during surgery.  The need for a blood transfusion is rare but having consent is a necessary part of your care.     -You should not be taking blood thinners or aspirin at least ten days prior to surgery unless instructed by your surgeon.  Day Before Surgery at [facility] will be asked to take in a light diet the day before surgery.  Avoid carbonated beverages.  You will be advised to have nothing to eat or drink after midnight the evening before.    Eat a light diet the day before surgery.  Examples including soups, broths, toast, yogurt, mashed potatoes.  Things to avoid include carbonated beverages (fizzy beverages), raw fruits and raw vegetables, or beans.   If your bowels are filled with gas, your surgeon will have difficulty visualizing your pelvic organs which increases your surgical risks.  Your role in recovery Your role is to become active as soon as directed by your doctor, while still giving yourself time to heal.  Rest when you feel tired. You will be asked to do the following in order to speed your recovery:  - Cough and  breathe deeply. This helps toclear and expand your lungs and can prevent pneumonia. You may be given a spirometer to practice deep breathing. A staff member will show you how to use the spirometer. - Do mild physical activity. Walking or moving your legs help your circulation and body functions return to normal. A staff member will help you when you try to walk and will provide you with simple exercises. Do not try to get up or walk alone the first time. - Actively manage your pain. Managing your pain lets you move in comfort. We will ask you to rate your pain on a scale of zero to 10. It is your responsibility to tell your doctor or nurse where and how much you hurt so your pain can be treated.  Special Considerations -If you are diabetic, you may be placed on insulin after surgery to have closer control over your blood sugars to promote healing and recovery.  This does not mean that you will be discharged on insulin.  If applicable, your oral antidiabetics will be resumed when you are tolerating a solid diet.  -Your final pathology results from surgery should be available by the Friday after surgery and the results will be relayed to you when available.  -Dr. Lahoma Crocker is the Surgeon that assists your GYN Oncologist with surgery.  The next day after your surgery you will either see your GYN Oncologist or Dr. Lahoma Crocker.   Blood Transfusion Information WHAT IS A BLOOD TRANSFUSION? A transfusion is the replacement  of blood or some of its parts. Blood is made up of multiple cells which provide different functions.  Red blood cells carry oxygen and are used for blood loss replacement.  White blood cells fight against infection.  Platelets control bleeding.  Plasma helps clot blood.  Other blood products are available for specialized needs, such as hemophilia or other clotting disorders. BEFORE THE TRANSFUSION  Who gives blood for transfusions?   You may be able to donate  blood to be used at a later date on yourself (autologous donation).  Relatives can be asked to donate blood. This is generally not any safer than if you have received blood from a stranger. The same precautions are taken to ensure safety when a relative's blood is donated.  Healthy volunteers who are fully evaluated to make sure their blood is safe. This is blood bank blood. Transfusion therapy is the safest it has ever been in the practice of medicine. Before blood is taken from a donor, a complete history is taken to make sure that person has no history of diseases nor engages in risky social behavior (examples are intravenous drug use or sexual activity with multiple partners). The donor's travel history is screened to minimize risk of transmitting infections, such as malaria. The donated blood is tested for signs of infectious diseases, such as HIV and hepatitis. The blood is then tested to be sure it is compatible with you in order to minimize the chance of a transfusion reaction. If you or a relative donates blood, this is often done in anticipation of surgery and is not appropriate for emergency situations. It takes many days to process the donated blood. RISKS AND COMPLICATIONS Although transfusion therapy is very safe and saves many lives, the main dangers of transfusion include:   Getting an infectious disease.  Developing a transfusion reaction. This is an allergic reaction to something in the blood you were given. Every precaution is taken to prevent this. The decision to have a blood transfusion has been considered carefully by your caregiver before blood is given. Blood is not given unless the benefits outweigh the risks.

## 2017-11-03 NOTE — Progress Notes (Signed)
Consult Note: Gyn-Onc  Consult was requested by Dr. Ronita Hipps for the evaluation of Carol Wells 84 y.o. female  CC:  Chief Complaint  Patient presents with  . Fibroids    Enlarged Fibroids    Assessment/Plan:  Carol Wells  is a 84 y.o.  year old with symptomatic 20+cm fibroid uterus.  There has been subtle growth in the fibroid's size over the past 5 years, though this growth is subtle enough that it is unlikely to be LMS. Additionally, it's MRI characteristics are more consistent with a degenerating fibroid. The fibroid is mobile and mostly abdominal rather than pelvic, therefore I feel that she is a good candidate for a robotic assisted total hysterectomy >250gm, BSO with minilaparotomy for specimen delivery (via low transverse incision).  I discussed that an alternative would be no surgery, and just expectant follow-up. However, given the poor quality of life associated with urinary frequency, this is not my preferred recommendation.  I discussed operative risks including  bleeding, infection, damage to internal organs (such as bladder,ureters, bowels), blood clot, reoperation and rehospitalization.  All questions were answered including anticipated recovery and hospital stay.  The patient is electing to proceed with robotic assisted total hysterectomy >250gm, BSO and minilaparotomy. This has been scheduled for later this month.  Postop she will be able to stop progesterone.   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  The patient has had a known fibroid uterus for many years. It has gradually grown in size and become increasingly symptomatic with urinary frequency.  She had an MRI in 2015 which showed a uterus measuring 13.8x10.5x11.5cm. CT scan in 2017 showed some interval growth (largest dimension 21cm). Repeat MRI on 09/07/17 showed a uterus measuring  21.1x12.2x15.1cm with a large fibroid again seen in the anterior corpus and fundus which shows heterogeneous contrast enhancement and areas of cystic degeneration. This measures 17.1x11.5x14.0cm and has not significantly changed in size since 2017 CT. Although it does show increased size compared to earlier MRI in 2015. This mass continues to show well-circumscribed margins without evidence of invasion of adjacent structures. Endometrium is not visualized due to compression by the large fibroid. Cervix is normal.  She consulted with IR regarding embolization, but they felt this was contraindicated due to fibroid size and her age.  She was seen at [facility] for an opinion regarding MIS surgery, however, after reviewing the MRI, they felt that it should be managed by an oncologic surgeon.  Idabelle reports intermittent vaginal spotting for many years. It is occasional (approximately every 6 months). She has urinary frequency. She is otherwise very healthy with good activity levels. She has had 2 vaginal deliveries and no prior abdominal surgeries.   Current Meds:  Outpatient Encounter Medications as of 11/03/2017  Medication Sig  . atorvastatin (LIPITOR) 10 MG tablet TAKE 1 TABLET BY MOUTH DAILY. GENERIC EQUIVALENT FOR LIPITOR (Patient taking differently: TAKE 1/2 TABLET BY MOUTH DAILY. GENERIC EQUIVALENT FOR LIPITOR)  . budesonide (ENTOCORT EC) 3 MG 24 hr capsule Take 2 capsules (6 mg total) by mouth daily. (Patient taking differently: Take 6 mg by mouth daily. Take one capsule by mouth daily.)  . Calcium Carbonate-Vitamin D (CALCIUM 600/VITAMIN D PO) Take 1 tablet by mouth daily at 12 noon.   . clobetasol ointment (TEMOVATE) 5.62 % Apply 1 application topically as needed.   Marland Kitchen estradiol (ESTRACE)  0.5 MG tablet Take 0.5 mg by mouth at bedtime.   . ferrous sulfate 324 (65 Fe) MG TBEC Take 1 tablet by mouth every morning.  . latanoprost (XALATAN) 0.005 % ophthalmic solution Place 1 drop into both eyes at  bedtime.    Marland Kitchen levothyroxine (SYNTHROID, LEVOTHROID) 125 MCG tablet Take 125 mcg by mouth daily.  Marland Kitchen losartan-hydrochlorothiazide (HYZAAR) 100-12.5 MG tablet Take 1 tablet by mouth daily.  . Multiple Vitamin (MULTIVITAMIN) tablet Take 1 tablet by mouth daily at 12 noon.   . norethindrone (AYGESTIN) 5 MG tablet Take 2.5 mg by mouth at bedtime.   . pantoprazole (PROTONIX) 40 MG tablet Take 1 tablet (40 mg total) by mouth daily. (Patient taking differently: Take 40 mg by mouth every other day. )  . vitamin C (ASCORBIC ACID) 500 MG tablet Take 500 mg by mouth daily at 12 noon.   . [DISCONTINUED] hydrochlorothiazide (MICROZIDE) 12.5 MG capsule Take 12.5 mg by mouth daily.  . [DISCONTINUED] olmesartan (BENICAR) 40 MG tablet Take 40 mg by mouth daily.  . [DISCONTINUED] pantoprazole (PROTONIX) 40 MG tablet TAKE 1 TABLET BY MOUTH TWO TIMES DAILY BEFORE A MEAL - APPOINTMENT IS NEEDED FOR FURTHER REIFLLS (Patient not taking: Reported on 11/03/2017)   No facility-administered encounter medications on file as of 11/03/2017.     Allergy: No Known Allergies  Social Hx:   Social History   Socioeconomic History  . Marital status: Married    Spouse name: Not on file  . Number of children: 2  . Years of education: Not on file  . Highest education level: Not on file  Occupational History  . Occupation: Retired Animal nutritionist  . Financial resource strain: Not on file  . Food insecurity:    Worry: Not on file    Inability: Not on file  . Transportation needs:    Medical: Not on file    Non-medical: Not on file  Tobacco Use  . Smoking status: Never Smoker  . Smokeless tobacco: Never Used  Substance and Sexual Activity  . Alcohol use: Yes    Comment: occ  . Drug use: No  . Sexual activity: Not on file  Lifestyle  . Physical activity:    Days per week: Not on file    Minutes per session: Not on file  . Stress: Not on file  Relationships  . Social connections:    Talks on phone: Not on file     Gets together: Not on file    Attends religious service: Not on file    Active member of club or organization: Not on file    Attends meetings of clubs or organizations: Not on file    Relationship status: Not on file  . Intimate partner violence:    Fear of current or ex partner: Not on file    Emotionally abused: Not on file    Physically abused: Not on file    Forced sexual activity: Not on file  Other Topics Concern  . Not on file  Social History Narrative  . Not on file    Past Surgical Hx:  Past Surgical History:  Procedure Laterality Date  . BUNIONECTOMY     Left  . CATARACT EXTRACTION W/ INTRAOCULAR LENS IMPLANT  03/2010   Left  . CATARACT EXTRACTION W/ INTRAOCULAR LENS IMPLANT Bilateral 11/2010  . COLONOSCOPY  2013   NORMAL   . ESOPHAGOGASTRODUODENOSCOPY N/A 11/23/2015   Procedure: ESOPHAGOGASTRODUODENOSCOPY (EGD);  Surgeon: Rogene Houston, MD;  [facility];  Service: Endoscopy;  Laterality: N/A;  . EUS N/A 12/19/2015   Procedure: ESOPHAGEAL ENDOSCOPIC ULTRASOUND (EUS) RADIAL;  Surgeon: Milus Banister, MD;  Location: [facility];  Service: Endoscopy;  Laterality: N/A;  . FUNCTIONAL ENDOSCOPIC SINUS SURGERY    . HAND SURGERY Right 1998  . INGUINAL HERNIA REPAIR Left 03/11/2016   Procedure: LEFT INGUINAL HERINA REPAIR WITH MESH;  Surgeon: Donnie Mesa, MD;  Location: [place];  Service: General;  Laterality: Left;  . INSERTION OF MESH Left 03/11/2016   Procedure: INSERTION OF MESH;  Surgeon: Donnie Mesa, MD;  Location: [place];  Service: General;  Laterality: Left;  . IR RADIOLOGIST EVAL & MGMT  07/01/2017  . LAMINECTOMY  03/2008  . POLYPECTOMY    . TOTAL HIP ARTHROPLASTY  2008   Right    Past Medical Hx:  Past Medical History:  Diagnosis Date  . Anemia   . Arthritis   . Blood transfusion 2017 JULY 22  . Carotid stenosis    Dopplers 6/19: RICA 50-93%, LICA 2-67%  . Cataract    REMOVED  . Chronic headaches    HX MIGRAINES  . Collagenous colitis    . Diverticulosis 2013   Colonoscopy  . Gastric ulcer   . Glaucoma   . Hx of cardiac catheterization    LHC 3/14: no angiographic CAD, EF 65%  . Hx of echocardiogram    Echocardiogram 06/21/12: Mild LVH, EF 12-45%, grade 2 diastolic dysfunction, mild MR  . Hyperlipidemia   . Hypertension   . Hypothyroidism   . Internal hemorrhoids 2009   Colonoscopy  . UTI (urinary tract infection)     Past Gynecological History:  No abnormal paps, 2xSVD No LMP recorded. Patient is postmenopausal.  Family Hx:  Family History  Problem Relation Age of Onset  . Colon cancer Brother   . Colon polyps Brother     Review of Systems:  Constitutional  Feels well,    ENT Normal appearing ears and nares bilaterally Skin/Breast  No rash, sores, jaundice, itching, dryness Cardiovascular  No chest pain, shortness of breath, or edema  Pulmonary  No cough or wheeze.  Gastro Intestinal  No nausea, vomitting, or diarrhoea. No bright red blood per rectum, no abdominal pain, change in bowel movement, or constipation.  Genito Urinary  + frequency, no urgency or dysuria,  Musculo Skeletal  No myalgia, arthralgia, joint swelling or pain  Neurologic  No weakness, numbness, change in gait,  Psychology  No depression, anxiety, insomnia.   Vitals:  Blood pressure (!) 164/68, pulse 81, temperature 97.7 F (36.5 C), temperature source Oral, resp. rate 20, height 5\' 1"  (1.549 m), weight 137 lb 8 oz (62.4 kg), SpO2 96 %.  Physical Exam: WD in NAD Neck  Supple NROM, without any enlargements.  Lymph Node Survey No cervical supraclavicular or inguinal adenopathy Cardiovascular  Pulse normal rate, regularity and rhythm. S1 and S2 normal.  Lungs  Clear to auscultation bilateraly, without wheezes/crackles/rhonchi. Good air movement.  Skin  No rash/lesions/breakdown  Psychiatry  Alert and oriented to person, place, and time  Abdomen  Normoactive bowel sounds, abdomen soft, non-tender and thin without  evidence of hernia. Abdominal fibroid palpable to level of umbilicus - mobile. Back No CVA tenderness Genito Urinary  Vulva/vagina: Normal external female genitalia.  No lesions. No discharge or bleeding.  Bladder/urethra:  No lesions or masses, well supported bladder  Vagina: normal, some posterior prolapse.  Cervix: Normal appearing, no lesions.  Uterus:  20+cm, mobile  with good mobility either side.   Adnexa: no discrete masses. Rectal  Good tone, no masses no cul de sac nodularity.  Extremities  No bilateral cyanosis, clubbing or edema.   PROCEDURE NOTE: ENDOMETRIAL BIOPSY preop dx: postmenopausal bleeding preop dx: same Procedure: endometrial biopsy Pathology: endometrial biopsy to pathology Surgeon: Everitt Amber EBL: minimal Patient provided verbal consent. Time out performed Cervix visualized with speculum, grasped with tenaculum. Os finder used. Pipelle passed to its full length and aspirated with 2 passes.  Tolerated procedure well.   Thereasa Solo, MD  11/03/2017, 12:52 PM

## 2017-11-05 ENCOUNTER — Telehealth: Payer: Self-pay | Admitting: *Deleted

## 2017-11-05 NOTE — Telephone Encounter (Signed)
Patient called and stated that she would like to have surgery on July 11th. Explained to the patient I will notify [facility] on Monday

## 2017-11-08 NOTE — Patient Instructions (Addendum)
Carol Wells  11/08/2017   Your procedure is scheduled on: 11-11-17  Report to [facility] Main  Entrance  Report to admitting at      1000 AM    Call this number if you have problems the morning of surgery [phone]    Remember: Do not eat food :After Midnight.              EAT A LIGHT DIET THE DAY BEFORE YOUR SURGERY: EXAMPLES ARE TOAST, YOGURT,SOUPS AND BROTHS AVOID : RAW FRUITS AND VEGGIES AND AND CARBONATED BEVERAGES AND BEANS   NO SOLID FOOD AFTER MIDNIGHT THE NIGHT PRIOR TO SURGERY. NOTHING BY MOUTH EXCEPT CLEAR LIQUIDS UNTIL 3 HOURS PRIOR TO [place] SURGERY. PLEASE FINISH ENSURE DRINK PER SURGEON ORDER 3 HOURS PRIOR TO SCHEDULED SURGERY TIME WHICH NEEDS TO BE COMPLETED AT __0930 am    Take these medicines the morning of surgery with A SIP OF WATER: protonix, levothyroxine                                You may not have any metal on your body including hair pins and              piercings  Do not wear jewelry, make-up, lotions, powders or perfumes, deodorant             Do not wear nail polish.  Do not shave  48 hours prior to surgery.      Do not bring valuables to the hospital. [place].  Contacts, dentures or bridgework may not be worn into surgery.  Leave suitcase in the car. After surgery it may be brought to your room.                 Please read over the following fact sheets you were given: _____________________________________________________________________           [facility] - Preparing for Surgery Before surgery, you can play an important role.  Because skin is not sterile, your skin needs to be as free of germs as possible.  You can reduce the number of germs on your skin by washing with CHG (chlorahexidine gluconate) soap before surgery.  CHG is an antiseptic cleaner which kills germs and bonds with the skin to continue killing germs even after washing. Please DO NOT use if you  have an allergy to CHG or antibacterial soaps.  If your skin becomes reddened/irritated stop using the CHG and inform your nurse when you arrive at Short Stay. Do not shave (including legs and underarms) for at least 48 hours prior to the first CHG shower.  You may shave your face/neck. Please follow these instructions carefully:  1.  Shower with CHG Soap the night before surgery and the  morning of Surgery.  2.  If you choose to wash your hair, wash your hair first as usual with your  normal  shampoo.  3.  After you shampoo, rinse your hair and body thoroughly to remove the  shampoo.                           4.  Use CHG as you would any other liquid soap.  You  can apply chg directly  to the skin and wash                       Gently with a scrungie or clean washcloth.  5.  Apply the CHG Soap to your body ONLY FROM THE NECK DOWN.   Do not use on face/ open                           Wound or open sores. Avoid contact with eyes, ears mouth and genitals (private parts).                       Wash face,  Genitals (private parts) with your normal soap.             6.  Wash thoroughly, paying special attention to the area where your surgery  will be performed.  7.  Thoroughly rinse your body with warm water from the neck down.  8.  DO NOT shower/wash with your normal soap after using and rinsing off  the CHG Soap.                9.  Pat yourself dry with a clean towel.            10.  Wear clean pajamas.            11.  Place clean sheets on your bed the night of your first shower and do not  sleep with pets. Day of Surgery : Do not apply any lotions/deodorants the morning of surgery.  Please wear clean clothes to the hospital/surgery center.  FAILURE TO FOLLOW THESE INSTRUCTIONS MAY RESULT IN THE CANCELLATION OF YOUR SURGERY PATIENT SIGNATURE_________________________________  NURSE  SIGNATURE__________________________________  ________________________________________________________________________  WHAT IS A BLOOD TRANSFUSION? Blood Transfusion Information  A transfusion is the replacement of blood or some of its parts. Blood is made up of multiple cells which provide different functions.  Red blood cells carry oxygen and are used for blood loss replacement.  White blood cells fight against infection.  Platelets control bleeding.  Plasma helps clot blood.  Other blood products are available for specialized needs, such as hemophilia or other clotting disorders. BEFORE THE TRANSFUSION  Who gives blood for transfusions?   Healthy volunteers who are fully evaluated to make sure their blood is safe. This is blood bank blood. Transfusion therapy is the safest it has ever been in the practice of medicine. Before blood is taken from a donor, a complete history is taken to make sure that person has no history of diseases nor engages in risky social behavior (examples are intravenous drug use or sexual activity with multiple partners). The donor's travel history is screened to minimize risk of transmitting infections, such as malaria. The donated blood is tested for signs of infectious diseases, such as HIV and hepatitis. The blood is then tested to be sure it is compatible with you in order to minimize the chance of a transfusion reaction. If you or a relative donates blood, this is often done in anticipation of surgery and is not appropriate for emergency situations. It takes many days to process the donated blood. RISKS AND COMPLICATIONS Although transfusion therapy is very safe and saves many lives, the main dangers of transfusion include:   Getting an infectious disease.  Developing a transfusion reaction. This is an allergic reaction to something in the blood you were given. Every  precaution is taken to prevent this. The decision to have a blood transfusion has been  considered carefully by your caregiver before blood is given. Blood is not given unless the benefits outweigh the risks. AFTER THE TRANSFUSION  Right after receiving a blood transfusion, you will usually feel much better and more energetic. This is especially true if your red blood cells have gotten low (anemic). The transfusion raises the level of the red blood cells which carry oxygen, and this usually causes an energy increase.  The nurse administering the transfusion will monitor you carefully for complications. HOME CARE INSTRUCTIONS  No special instructions are needed after a transfusion. You may find your energy is better. Speak with your caregiver about any limitations on activity for underlying diseases you may have. SEEK MEDICAL CARE IF:   Your condition is not improving after your transfusion.  You develop redness or irritation at the intravenous (IV) site. SEEK IMMEDIATE MEDICAL CARE IF:  Any of the following symptoms occur over the next 12 hours:  Shaking chills.  You have a temperature by mouth above 102 F (38.9 C), not controlled by medicine.  Chest, back, or muscle pain.  People around you feel you are not acting correctly or are confused.  Shortness of breath or difficulty breathing.  Dizziness and fainting.  You get a rash or develop hives.  You have a decrease in urine output.  Your urine turns a dark color or changes to pink, red, or brown. Any of the following symptoms occur over the next 10 days:  You have a temperature by mouth above 102 F (38.9 C), not controlled by medicine.  Shortness of breath.  Weakness after normal activity.  The white part of the eye turns yellow (jaundice).  You have a decrease in the amount of urine or are urinating less often.  Your urine turns a dark color or changes to pink, red, or brown. Document Released: 04/17/2000 Document Revised: 07/13/2011 Document Reviewed: 12/05/2007 ExitCare Patient Information 2014  [place].  _______________________________________________________________________  Incentive Spirometer  An incentive spirometer is a tool that can help keep your lungs clear and active. This tool measures how well you are filling your lungs with each breath. Taking long deep breaths may help reverse or decrease the chance of developing breathing (pulmonary) problems (especially infection) following:  A long period of time when you are unable to move or be active. BEFORE THE PROCEDURE   If the spirometer includes an indicator to show your best effort, your nurse or respiratory therapist will set it to a desired goal.  If possible, sit up straight or lean slightly forward. Try not to slouch.  Hold the incentive spirometer in an upright position. INSTRUCTIONS FOR USE  1. Sit on the edge of your bed if possible, or sit up as far as you can in bed or on a chair. 2. Hold the incentive spirometer in an upright position. 3. Breathe out normally. 4. Place the mouthpiece in your mouth and seal your lips tightly around it. 5. Breathe in slowly and as deeply as possible, raising the piston or the ball toward the top of the column. 6. Hold your breath for 3-5 seconds or for as long as possible. Allow the piston or ball to fall to the bottom of the column. 7. Remove the mouthpiece from your mouth and breathe out normally. 8. Rest for a few seconds and repeat Steps 1 through 7 at least 10 times every 1-2 hours when you are awake.  Take your time and take a few normal breaths between deep breaths. 9. The spirometer may include an indicator to show your best effort. Use the indicator as a goal to work toward during each repetition. 10. After each set of 10 deep breaths, practice coughing to be sure your lungs are clear. If you have an incision (the cut made at the time of surgery), support your incision when coughing by placing a pillow or rolled up towels firmly against it. Once you are able to get  out of bed, walk around indoors and cough well. You may stop using the incentive spirometer when instructed by your caregiver.  RISKS AND COMPLICATIONS  Take your time so you do not get dizzy or light-headed.  If you are in pain, you may need to take or ask for pain medication before doing incentive spirometry. It is harder to take a deep breath if you are having pain. AFTER USE  Rest and breathe slowly and easily.  It can be helpful to keep track of a log of your progress. Your caregiver can provide you with a simple table to help with this. If you are using the spirometer at home, follow these instructions: [place] IF:   You are having difficultly using the spirometer.  You have trouble using the spirometer as often as instructed.  Your pain medication is not giving enough relief while using the spirometer.  You develop fever of 100.5 F (38.1 C) or higher. SEEK IMMEDIATE MEDICAL CARE IF:   You cough up bloody sputum that had not been present before.  You develop fever of 102 F (38.9 C) or greater.  You develop worsening pain at or near the incision site. MAKE SURE YOU:   Understand these instructions.  Will watch your condition.  Will get help right away if you are not doing well or get worse. Document Released: 08/31/2006 Document Revised: 07/13/2011 Document Reviewed: 11/01/2006 [facility] Patient Information 2014 [place], [place].   ________________________________________________________________________

## 2017-11-09 ENCOUNTER — Telehealth: Payer: Self-pay

## 2017-11-09 NOTE — Telephone Encounter (Signed)
Told Ms Remmert that the biopsy was negative for cancer and no pre cancer per Joylene John, NP. Pt verbalized understanding.

## 2017-11-10 ENCOUNTER — Other Ambulatory Visit: Payer: Self-pay

## 2017-11-10 ENCOUNTER — Encounter (HOSPITAL_COMMUNITY)
Admission: RE | Admit: 2017-11-10 | Discharge: 2017-11-10 | Disposition: A | Discharge status: Home / Self Care | Payer: Medicare Other | Source: Ambulatory Visit | Attending: Gynecologic Oncology | Admitting: Gynecologic Oncology

## 2017-11-10 ENCOUNTER — Encounter (HOSPITAL_COMMUNITY): Payer: Self-pay

## 2017-11-10 DIAGNOSIS — R350 Frequency of micturition: Secondary | ICD-10-CM | POA: Diagnosis not present

## 2017-11-10 DIAGNOSIS — I10 Essential (primary) hypertension: Secondary | ICD-10-CM | POA: Diagnosis not present

## 2017-11-10 DIAGNOSIS — N83202 Unspecified ovarian cyst, left side: Secondary | ICD-10-CM | POA: Diagnosis not present

## 2017-11-10 DIAGNOSIS — D649 Anemia, unspecified: Secondary | ICD-10-CM | POA: Diagnosis not present

## 2017-11-10 DIAGNOSIS — I6529 Occlusion and stenosis of unspecified carotid artery: Secondary | ICD-10-CM | POA: Diagnosis not present

## 2017-11-10 DIAGNOSIS — E785 Hyperlipidemia, unspecified: Secondary | ICD-10-CM | POA: Diagnosis not present

## 2017-11-10 DIAGNOSIS — D259 Leiomyoma of uterus, unspecified: Secondary | ICD-10-CM | POA: Diagnosis not present

## 2017-11-10 DIAGNOSIS — G43909 Migraine, unspecified, not intractable, without status migrainosus: Secondary | ICD-10-CM | POA: Diagnosis not present

## 2017-11-10 DIAGNOSIS — Z96641 Presence of right artificial hip joint: Secondary | ICD-10-CM | POA: Diagnosis not present

## 2017-11-10 DIAGNOSIS — H409 Unspecified glaucoma: Secondary | ICD-10-CM | POA: Diagnosis not present

## 2017-11-10 DIAGNOSIS — K279 Peptic ulcer, site unspecified, unspecified as acute or chronic, without hemorrhage or perforation: Secondary | ICD-10-CM | POA: Diagnosis not present

## 2017-11-10 DIAGNOSIS — E039 Hypothyroidism, unspecified: Secondary | ICD-10-CM | POA: Diagnosis not present

## 2017-11-10 DIAGNOSIS — Z8371 Family history of colonic polyps: Secondary | ICD-10-CM | POA: Diagnosis not present

## 2017-11-10 DIAGNOSIS — K648 Other hemorrhoids: Secondary | ICD-10-CM | POA: Diagnosis not present

## 2017-11-10 DIAGNOSIS — Z9841 Cataract extraction status, right eye: Secondary | ICD-10-CM | POA: Diagnosis not present

## 2017-11-10 DIAGNOSIS — N83201 Unspecified ovarian cyst, right side: Secondary | ICD-10-CM | POA: Diagnosis not present

## 2017-11-10 DIAGNOSIS — Z800 Family history of malignant neoplasm of digestive organs: Secondary | ICD-10-CM | POA: Diagnosis not present

## 2017-11-10 DIAGNOSIS — I739 Peripheral vascular disease, unspecified: Secondary | ICD-10-CM | POA: Diagnosis not present

## 2017-11-10 DIAGNOSIS — M1990 Unspecified osteoarthritis, unspecified site: Secondary | ICD-10-CM | POA: Diagnosis not present

## 2017-11-10 DIAGNOSIS — K219 Gastro-esophageal reflux disease without esophagitis: Secondary | ICD-10-CM | POA: Diagnosis not present

## 2017-11-10 DIAGNOSIS — Z9842 Cataract extraction status, left eye: Secondary | ICD-10-CM | POA: Diagnosis not present

## 2017-11-10 DIAGNOSIS — Z79899 Other long term (current) drug therapy: Secondary | ICD-10-CM | POA: Diagnosis not present

## 2017-11-10 LAB — ABO/RH: ABO/RH(D): A NEG

## 2017-11-10 LAB — CBC
HCT: 41.8 % (ref 36.0–46.0)
Hemoglobin: 14.3 g/dL (ref 12.0–15.0)
MCH: 31.7 pg (ref 26.0–34.0)
MCHC: 34.2 g/dL (ref 30.0–36.0)
MCV: 92.7 fL (ref 78.0–100.0)
PLATELETS: 315 10*3/uL (ref 150–400)
RBC: 4.51 MIL/uL (ref 3.87–5.11)
RDW: 13.3 % (ref 11.5–15.5)
WBC: 6.5 10*3/uL (ref 4.0–10.5)

## 2017-11-10 LAB — URINALYSIS, ROUTINE W REFLEX MICROSCOPIC
BILIRUBIN URINE: NEGATIVE
GLUCOSE, UA: NEGATIVE mg/dL
KETONES UR: NEGATIVE mg/dL
LEUKOCYTES UA: NEGATIVE
NITRITE: NEGATIVE
PH: 7 (ref 5.0–8.0)
Protein, ur: NEGATIVE mg/dL
SPECIFIC GRAVITY, URINE: 1.01 (ref 1.005–1.030)

## 2017-11-10 LAB — BASIC METABOLIC PANEL
Anion gap: 8 (ref 5–15)
BUN: 15 mg/dL (ref 8–23)
CHLORIDE: 100 mmol/L (ref 98–111)
CO2: 26 mmol/L (ref 22–32)
CREATININE: 0.79 mg/dL (ref 0.44–1.00)
Calcium: 9 mg/dL (ref 8.9–10.3)
GFR calc non Af Amer: >60 mL/min (ref >60)
Glucose, Bld: 89 mg/dL (ref 70–99)
Potassium: 5.2 mmol/L — ABNORMAL HIGH (ref 3.5–5.1)
Sodium: 134 mmol/L — ABNORMAL LOW (ref 135–145)

## 2017-11-11 ENCOUNTER — Encounter (HOSPITAL_COMMUNITY)
Admission: RE | Disposition: A | Discharge status: Home / Self Care | Payer: Self-pay | Source: Ambulatory Visit | Attending: Gynecologic Oncology

## 2017-11-11 ENCOUNTER — Encounter (HOSPITAL_COMMUNITY): Payer: Self-pay

## 2017-11-11 ENCOUNTER — Inpatient Hospital Stay (HOSPITAL_COMMUNITY): Payer: Medicare Other | Admitting: Anesthesiology

## 2017-11-11 ENCOUNTER — Other Ambulatory Visit: Payer: Self-pay

## 2017-11-11 ENCOUNTER — Ambulatory Visit (HOSPITAL_COMMUNITY)
Admission: RE | Admit: 2017-11-11 | Discharge: 2017-11-12 | Disposition: A | Discharge status: Home / Self Care | Payer: Medicare Other | Source: Ambulatory Visit | Attending: Gynecologic Oncology | Admitting: Gynecologic Oncology

## 2017-11-11 DIAGNOSIS — I10 Essential (primary) hypertension: Secondary | ICD-10-CM | POA: Insufficient documentation

## 2017-11-11 DIAGNOSIS — K219 Gastro-esophageal reflux disease without esophagitis: Secondary | ICD-10-CM | POA: Insufficient documentation

## 2017-11-11 DIAGNOSIS — N83202 Unspecified ovarian cyst, left side: Secondary | ICD-10-CM | POA: Diagnosis not present

## 2017-11-11 DIAGNOSIS — H409 Unspecified glaucoma: Secondary | ICD-10-CM | POA: Insufficient documentation

## 2017-11-11 DIAGNOSIS — N83201 Unspecified ovarian cyst, right side: Secondary | ICD-10-CM

## 2017-11-11 DIAGNOSIS — D649 Anemia, unspecified: Secondary | ICD-10-CM | POA: Insufficient documentation

## 2017-11-11 DIAGNOSIS — D279 Benign neoplasm of unspecified ovary: Secondary | ICD-10-CM | POA: Diagnosis not present

## 2017-11-11 DIAGNOSIS — Z9841 Cataract extraction status, right eye: Secondary | ICD-10-CM | POA: Insufficient documentation

## 2017-11-11 DIAGNOSIS — Z96641 Presence of right artificial hip joint: Secondary | ICD-10-CM | POA: Insufficient documentation

## 2017-11-11 DIAGNOSIS — Z9842 Cataract extraction status, left eye: Secondary | ICD-10-CM | POA: Insufficient documentation

## 2017-11-11 DIAGNOSIS — K279 Peptic ulcer, site unspecified, unspecified as acute or chronic, without hemorrhage or perforation: Secondary | ICD-10-CM | POA: Diagnosis not present

## 2017-11-11 DIAGNOSIS — Z800 Family history of malignant neoplasm of digestive organs: Secondary | ICD-10-CM | POA: Insufficient documentation

## 2017-11-11 DIAGNOSIS — E039 Hypothyroidism, unspecified: Secondary | ICD-10-CM | POA: Insufficient documentation

## 2017-11-11 DIAGNOSIS — Z79899 Other long term (current) drug therapy: Secondary | ICD-10-CM | POA: Diagnosis not present

## 2017-11-11 DIAGNOSIS — D259 Leiomyoma of uterus, unspecified: Principal | ICD-10-CM

## 2017-11-11 DIAGNOSIS — D219 Benign neoplasm of connective and other soft tissue, unspecified: Secondary | ICD-10-CM | POA: Diagnosis present

## 2017-11-11 DIAGNOSIS — E785 Hyperlipidemia, unspecified: Secondary | ICD-10-CM | POA: Insufficient documentation

## 2017-11-11 DIAGNOSIS — D509 Iron deficiency anemia, unspecified: Secondary | ICD-10-CM | POA: Diagnosis not present

## 2017-11-11 DIAGNOSIS — K648 Other hemorrhoids: Secondary | ICD-10-CM | POA: Insufficient documentation

## 2017-11-11 DIAGNOSIS — G43909 Migraine, unspecified, not intractable, without status migrainosus: Secondary | ICD-10-CM | POA: Insufficient documentation

## 2017-11-11 DIAGNOSIS — R350 Frequency of micturition: Secondary | ICD-10-CM | POA: Insufficient documentation

## 2017-11-11 DIAGNOSIS — I739 Peripheral vascular disease, unspecified: Secondary | ICD-10-CM | POA: Diagnosis not present

## 2017-11-11 DIAGNOSIS — I6529 Occlusion and stenosis of unspecified carotid artery: Secondary | ICD-10-CM | POA: Insufficient documentation

## 2017-11-11 DIAGNOSIS — Z8371 Family history of colonic polyps: Secondary | ICD-10-CM | POA: Insufficient documentation

## 2017-11-11 DIAGNOSIS — M1990 Unspecified osteoarthritis, unspecified site: Secondary | ICD-10-CM | POA: Insufficient documentation

## 2017-11-11 HISTORY — PX: ROBOTIC ASSISTED TOTAL HYSTERECTOMY WITH BILATERAL SALPINGO OOPHERECTOMY: SHX6086

## 2017-11-11 HISTORY — PX: LAPAROTOMY: SHX154

## 2017-11-11 LAB — TYPE AND SCREEN
ABO/RH(D): A NEG
Antibody Screen: NEGATIVE

## 2017-11-11 SURGERY — HYSTERECTOMY, TOTAL, ROBOT-ASSISTED, LAPAROSCOPIC, WITH BILATERAL SALPINGO-OOPHORECTOMY
Anesthesia: General

## 2017-11-11 MED ORDER — FENTANYL CITRATE (PF) 250 MCG/5ML IJ SOLN
INTRAMUSCULAR | Status: AC
  Filled 2017-11-11: qty 5

## 2017-11-11 MED ORDER — EPHEDRINE SULFATE-NACL 50-0.9 MG/10ML-% IV SOSY
PREFILLED_SYRINGE | INTRAVENOUS | Status: DC | PRN
  Administered 2017-11-11: 5 mg via INTRAVENOUS

## 2017-11-11 MED ORDER — ROCURONIUM BROMIDE 100 MG/10ML IV SOLN
INTRAVENOUS | Status: DC | PRN
  Administered 2017-11-11 (×3): 10 mg via INTRAVENOUS
  Administered 2017-11-11: 40 mg via INTRAVENOUS

## 2017-11-11 MED ORDER — PANTOPRAZOLE SODIUM 40 MG PO TBEC
40.0000 mg | DELAYED_RELEASE_TABLET | Freq: Every day | ORAL | Status: DC
  Administered 2017-11-12: 40 mg via ORAL
  Filled 2017-11-11: qty 1

## 2017-11-11 MED ORDER — SENNOSIDES-DOCUSATE SODIUM 8.6-50 MG PO TABS
2.0000 | ORAL_TABLET | Freq: Every day | ORAL | Status: DC
  Administered 2017-11-11: 2 via ORAL
  Filled 2017-11-11: qty 2

## 2017-11-11 MED ORDER — ORAL CARE MOUTH RINSE
15.0000 mL | Freq: Two times a day (BID) | OROMUCOSAL | Status: DC
  Administered 2017-11-11: 15 mL via OROMUCOSAL

## 2017-11-11 MED ORDER — ONDANSETRON HCL 4 MG/2ML IJ SOLN
INTRAMUSCULAR | Status: AC
  Filled 2017-11-11: qty 2

## 2017-11-11 MED ORDER — SUGAMMADEX SODIUM 200 MG/2ML IV SOLN: INTRAVENOUS | Status: DC | PRN

## 2017-11-11 MED ORDER — MEPERIDINE HCL 50 MG/ML IJ SOLN: 6.2500 mg | INTRAMUSCULAR | Status: DC | PRN

## 2017-11-11 MED ORDER — BUPIVACAINE LIPOSOME 1.3 % IJ SUSP
20.0000 mL | Freq: Once | INTRAMUSCULAR | Status: AC
  Administered 2017-11-11: 20 mL
  Filled 2017-11-11 (×2): qty 20

## 2017-11-11 MED ORDER — DEXAMETHASONE SODIUM PHOSPHATE 4 MG/ML IJ SOLN: 4.0000 mg | INTRAMUSCULAR | Status: DC

## 2017-11-11 MED ORDER — KCL IN DEXTROSE-NACL 20-5-0.45 MEQ/L-%-% IV SOLN
INTRAVENOUS | Status: DC
  Administered 2017-11-11: 18:00:00 via INTRAVENOUS
  Filled 2017-11-11: qty 1000

## 2017-11-11 MED ORDER — ACETAMINOPHEN 500 MG PO TABS
1000.0000 mg | ORAL_TABLET | ORAL | Status: AC
  Administered 2017-11-11: 1000 mg via ORAL
  Filled 2017-11-11: qty 2

## 2017-11-11 MED ORDER — ENOXAPARIN SODIUM 40 MG/0.4ML ~~LOC~~ SOLN
40.0000 mg | SUBCUTANEOUS | Status: AC
  Administered 2017-11-11: 40 mg via SUBCUTANEOUS
  Filled 2017-11-11: qty 0.4

## 2017-11-11 MED ORDER — ACETAMINOPHEN 500 MG PO TABS
1000.0000 mg | ORAL_TABLET | Freq: Two times a day (BID) | ORAL | Status: DC
  Administered 2017-11-11 – 2017-11-12 (×2): 1000 mg via ORAL
  Filled 2017-11-11 (×2): qty 2

## 2017-11-11 MED ORDER — TRAMADOL HCL 50 MG PO TABS
100.0000 mg | ORAL_TABLET | Freq: Two times a day (BID) | ORAL | Status: DC | PRN
  Administered 2017-11-11: 100 mg via ORAL
  Filled 2017-11-11: qty 2

## 2017-11-11 MED ORDER — LOSARTAN POTASSIUM 50 MG PO TABS
100.0000 mg | ORAL_TABLET | Freq: Every day | ORAL | Status: DC
  Filled 2017-11-11: qty 2

## 2017-11-11 MED ORDER — BUDESONIDE 3 MG PO CPEP
6.0000 mg | ORAL_CAPSULE | Freq: Every day | ORAL | Status: DC
  Filled 2017-11-11: qty 2

## 2017-11-11 MED ORDER — LEVOTHYROXINE SODIUM 100 MCG PO TABS
125.0000 ug | ORAL_TABLET | Freq: Every day | ORAL | Status: DC
  Administered 2017-11-12: 125 ug via ORAL
  Filled 2017-11-11: qty 1

## 2017-11-11 MED ORDER — SUGAMMADEX SODIUM 200 MG/2ML IV SOLN
INTRAVENOUS | Status: AC
  Filled 2017-11-11: qty 2

## 2017-11-11 MED ORDER — HYDROCHLOROTHIAZIDE 12.5 MG PO CAPS
12.5000 mg | ORAL_CAPSULE | Freq: Every day | ORAL | Status: DC
  Filled 2017-11-11: qty 1

## 2017-11-11 MED ORDER — DEXAMETHASONE SODIUM PHOSPHATE 10 MG/ML IJ SOLN
INTRAMUSCULAR | Status: AC
  Filled 2017-11-11: qty 1

## 2017-11-11 MED ORDER — ESTRADIOL 1 MG PO TABS
0.5000 mg | ORAL_TABLET | Freq: Every day | ORAL | Status: DC
  Administered 2017-11-11: 0.5 mg via ORAL
  Filled 2017-11-11: qty 0.5

## 2017-11-11 MED ORDER — FENTANYL CITRATE (PF) 250 MCG/5ML IJ SOLN
INTRAMUSCULAR | Status: DC | PRN
  Administered 2017-11-11: 25 ug via INTRAVENOUS
  Administered 2017-11-11: 50 ug via INTRAVENOUS
  Administered 2017-11-11: 100 ug via INTRAVENOUS
  Administered 2017-11-11: 50 ug via INTRAVENOUS
  Administered 2017-11-11: 25 ug via INTRAVENOUS

## 2017-11-11 MED ORDER — BUPIVACAINE HCL (PF) 0.25 % IJ SOLN
INTRAMUSCULAR | Status: AC
Start: 2017-11-11 — End: ?
  Filled 2017-11-11: qty 30

## 2017-11-11 MED ORDER — STERILE WATER FOR IRRIGATION IR SOLN
Status: DC | PRN
  Administered 2017-11-11: 1000 mL

## 2017-11-11 MED ORDER — DEXAMETHASONE SODIUM PHOSPHATE 10 MG/ML IJ SOLN
INTRAMUSCULAR | Status: DC | PRN
  Administered 2017-11-11: 5 mg via INTRAVENOUS

## 2017-11-11 MED ORDER — EPHEDRINE 5 MG/ML INJ
INTRAVENOUS | Status: AC
  Filled 2017-11-11: qty 10

## 2017-11-11 MED ORDER — FENTANYL CITRATE (PF) 100 MCG/2ML IJ SOLN
25.0000 ug | INTRAMUSCULAR | Status: DC | PRN
  Administered 2017-11-11 (×2): 50 ug via INTRAVENOUS

## 2017-11-11 MED ORDER — FENTANYL CITRATE (PF) 100 MCG/2ML IJ SOLN
INTRAMUSCULAR | Status: AC
  Filled 2017-11-11: qty 2

## 2017-11-11 MED ORDER — LACTATED RINGERS IV SOLN
INTRAVENOUS | Status: DC
  Administered 2017-11-11 (×2): via INTRAVENOUS

## 2017-11-11 MED ORDER — LIDOCAINE 2% (20 MG/ML) 5 ML SYRINGE
INTRAMUSCULAR | Status: DC | PRN
  Administered 2017-11-11: 60 mg via INTRAVENOUS

## 2017-11-11 MED ORDER — ROCURONIUM BROMIDE 100 MG/10ML IV SOLN
INTRAVENOUS | Status: AC
  Filled 2017-11-11: qty 1

## 2017-11-11 MED ORDER — ENOXAPARIN SODIUM 40 MG/0.4ML ~~LOC~~ SOLN
40.0000 mg | SUBCUTANEOUS | Status: DC
  Administered 2017-11-12: 40 mg via SUBCUTANEOUS
  Filled 2017-11-11: qty 0.4

## 2017-11-11 MED ORDER — ONDANSETRON HCL 4 MG/2ML IJ SOLN: 4.0000 mg | Freq: Four times a day (QID) | INTRAMUSCULAR | Status: DC | PRN

## 2017-11-11 MED ORDER — ONDANSETRON HCL 4 MG PO TABS: 4.0000 mg | ORAL_TABLET | Freq: Four times a day (QID) | ORAL | Status: DC | PRN

## 2017-11-11 MED ORDER — LOSARTAN POTASSIUM-HCTZ 100-12.5 MG PO TABS
1.0000 | ORAL_TABLET | Freq: Every day | ORAL | Status: DC
Start: 2017-11-12 — End: 2017-11-11

## 2017-11-11 MED ORDER — LIDOCAINE 2% (20 MG/ML) 5 ML SYRINGE
INTRAMUSCULAR | Status: AC
  Filled 2017-11-11: qty 5

## 2017-11-11 MED ORDER — LIP MEDEX EX OINT
TOPICAL_OINTMENT | CUTANEOUS | Status: AC
  Administered 2017-11-11: 18:00:00
  Filled 2017-11-11: qty 7

## 2017-11-11 MED ORDER — HYDROMORPHONE HCL 1 MG/ML IJ SOLN: 0.2000 mg | INTRAMUSCULAR | Status: DC | PRN

## 2017-11-11 MED ORDER — ONDANSETRON HCL 4 MG/2ML IJ SOLN
INTRAMUSCULAR | Status: DC | PRN
  Administered 2017-11-11: 4 mg via INTRAVENOUS

## 2017-11-11 MED ORDER — IBUPROFEN 200 MG PO TABS
600.0000 mg | ORAL_TABLET | Freq: Four times a day (QID) | ORAL | Status: DC
  Administered 2017-11-12: 600 mg via ORAL
  Filled 2017-11-11 (×2): qty 3

## 2017-11-11 MED ORDER — PROPOFOL 10 MG/ML IV BOLUS
INTRAVENOUS | Status: AC
  Filled 2017-11-11: qty 20

## 2017-11-11 MED ORDER — OXYCODONE HCL 5 MG PO TABS: 5.0000 mg | ORAL_TABLET | ORAL | Status: DC | PRN

## 2017-11-11 MED ORDER — SUGAMMADEX SODIUM 200 MG/2ML IV SOLN
INTRAVENOUS | Status: DC | PRN
  Administered 2017-11-11: 130 mg via INTRAVENOUS

## 2017-11-11 MED ORDER — LACTATED RINGERS IR SOLN
Status: DC | PRN
  Administered 2017-11-11: 1000 mL

## 2017-11-11 MED ORDER — CELECOXIB 200 MG PO CAPS
400.0000 mg | ORAL_CAPSULE | ORAL | Status: DC
  Filled 2017-11-11: qty 2

## 2017-11-11 MED ORDER — GABAPENTIN 300 MG PO CAPS
300.0000 mg | ORAL_CAPSULE | ORAL | Status: AC
  Administered 2017-11-11: 300 mg via ORAL
  Filled 2017-11-11: qty 1

## 2017-11-11 MED ORDER — CEFAZOLIN SODIUM-DEXTROSE 2-4 GM/100ML-% IV SOLN
2.0000 g | INTRAVENOUS | Status: AC
  Administered 2017-11-11: 2 g via INTRAVENOUS
  Filled 2017-11-11: qty 100

## 2017-11-11 MED ORDER — SODIUM CHLORIDE 0.9 % IJ SOLN
INTRAMUSCULAR | Status: AC
  Filled 2017-11-11: qty 20

## 2017-11-11 MED ORDER — PROPOFOL 10 MG/ML IV BOLUS
INTRAVENOUS | Status: DC | PRN
  Administered 2017-11-11: 90 mg via INTRAVENOUS

## 2017-11-11 SURGICAL SUPPLY — 47 items
APPLICATOR SURGIFLO ENDO (HEMOSTASIS) IMPLANT
BAG LAPAROSCOPIC 12 15 PORT 16 (BASKET) IMPLANT
BAG RETRIEVAL 12/15 (BASKET)
COVER BACK TABLE 60X90IN (DRAPES) ×3 IMPLANT
COVER TIP SHEARS 8 DVNC (MISCELLANEOUS) ×2 IMPLANT
COVER TIP SHEARS 8MM DA VINCI (MISCELLANEOUS) ×1
DERMABOND ADVANCED (GAUZE/BANDAGES/DRESSINGS) ×1
DERMABOND ADVANCED .7 DNX12 (GAUZE/BANDAGES/DRESSINGS) ×2 IMPLANT
DRAPE ARM DVNC X/XI (DISPOSABLE) ×8 IMPLANT
DRAPE COLUMN DVNC XI (DISPOSABLE) ×2 IMPLANT
DRAPE DA VINCI XI ARM (DISPOSABLE) ×4
DRAPE DA VINCI XI COLUMN (DISPOSABLE) ×1
DRAPE SHEET LG 3/4 BI-LAMINATE (DRAPES) ×3 IMPLANT
DRAPE SURG IRRIG POUCH 19X23 (DRAPES) ×3 IMPLANT
DRSG OPSITE POSTOP 4X6 (GAUZE/BANDAGES/DRESSINGS) ×3 IMPLANT
ELECT REM PT RETURN 15FT ADLT (MISCELLANEOUS) ×3 IMPLANT
GLOVE BIO SURGEON STRL SZ 6 (GLOVE) ×12 IMPLANT
GLOVE BIO SURGEON STRL SZ 6.5 (GLOVE) ×6 IMPLANT
GOWN STRL REUS W/ TWL LRG LVL3 (GOWN DISPOSABLE) ×6 IMPLANT
GOWN STRL REUS W/TWL LRG LVL3 (GOWN DISPOSABLE) ×3
HOLDER FOLEY CATH W/STRAP (MISCELLANEOUS) ×3 IMPLANT
IRRIG SUCT STRYKERFLOW 2 WTIP (MISCELLANEOUS) ×3
IRRIGATION SUCT STRKRFLW 2 WTP (MISCELLANEOUS) ×2 IMPLANT
KIT PROCEDURE DA VINCI SI (MISCELLANEOUS)
KIT PROCEDURE DVNC SI (MISCELLANEOUS) IMPLANT
MANIPULATOR UTERINE 4.5 ZUMI (MISCELLANEOUS) ×3 IMPLANT
NEEDLE SPNL 18GX3.5 QUINCKE PK (NEEDLE) IMPLANT
OBTURATOR OPTICAL STANDARD 8MM (TROCAR) ×1
OBTURATOR OPTICAL STND 8 DVNC (TROCAR) ×2
OBTURATOR OPTICALSTD 8 DVNC (TROCAR) ×2 IMPLANT
PACK ROBOT GYN CUSTOM WL (TRAY / TRAY PROCEDURE) ×3 IMPLANT
PAD POSITIONING PINK XL (MISCELLANEOUS) ×3 IMPLANT
PORT ACCESS TROCAR AIRSEAL 12 (TROCAR) ×2 IMPLANT
PORT ACCESS TROCAR AIRSEAL 5M (TROCAR) ×1
POUCH SPECIMEN RETRIEVAL 10MM (ENDOMECHANICALS) IMPLANT
SEAL CANN UNIV 5-8 DVNC XI (MISCELLANEOUS) ×8 IMPLANT
SEAL XI 5MM-8MM UNIVERSAL (MISCELLANEOUS) ×4
SET TRI-LUMEN FLTR TB AIRSEAL (TUBING) ×3 IMPLANT
SURGIFLO W/THROMBIN 8M KIT (HEMOSTASIS) IMPLANT
SUT VIC AB 0 CT1 27 (SUTURE) ×2
SUT VIC AB 0 CT1 27XBRD ANTBC (SUTURE) ×4 IMPLANT
SUT VIC AB 3-0 CT1 36 (SUTURE) ×3 IMPLANT
SYR 10ML LL (SYRINGE) IMPLANT
TOWEL OR NON WOVEN STRL DISP B (DISPOSABLE) ×3 IMPLANT
TRAP SPECIMEN MUCOUS 40CC (MISCELLANEOUS) IMPLANT
TRAY FOLEY BAG SILVER LF 14FR (CATHETERS) ×6 IMPLANT
UNDERPAD 30X30 (UNDERPADS AND DIAPERS) ×3 IMPLANT

## 2017-11-11 NOTE — Transfer of Care (Signed)
Immediate Anesthesia Transfer of Care Note  Patient: Carol Wells  Procedure(s) Performed: XI ROBOTIC ASSISTED TOTAL HYSTERECTOMY WITH BILATERAL SALPINGO OOPHORECTOMY FOR UTERUS GREATER THAN 250 GRAMS (Bilateral ) MINI LAPAROTOMY (N/A )  Patient Location: PACU  Anesthesia Type:General  Level of Consciousness: awake and alert   Airway & Oxygen Therapy: Patient Spontanous Breathing and Patient connected to face mask oxygen  Post-op Assessment: Report given to RN and Post -op Vital signs reviewed and stable  Post vital signs: Reviewed and stable  Last Vitals:  Vitals Value Taken Time  BP 149/82 11/11/2017  4:30 PM  Temp    Pulse 82 11/11/2017  4:31 PM  Resp 17 11/11/2017  4:31 PM  SpO2 100 % 11/11/2017  4:31 PM  Vitals shown include unvalidated device data.  Last Pain:  Vitals:   11/11/17 1020  TempSrc:   PainSc: 0-No pain         Complications: No apparent anesthesia complications

## 2017-11-11 NOTE — Interval H&P Note (Signed)
History and Physical Interval Note:  11/11/2017 10:33 AM  Carol Wells  has presented today for surgery, with the diagnosis of uterine fibroids  The various methods of treatment have been discussed with the patient and family. After consideration of risks, benefits and other options for treatment, the patient has consented to  Procedure(s): XI ROBOTIC ASSISTED TOTAL HYSTERECTOMY WITH BILATERAL SALPINGO OOPHORECTOMY WITH MINI LAPAROTOMY FOR SPECIMEN DELIVERY (Bilateral) MINI LAPAROTOMY (N/A) as a surgical intervention .  The patient's history has been reviewed, patient examined, no change in status, stable for surgery.  I have reviewed the patient's chart and labs. Endometrial cancer benign. Questions were answered to the patient's satisfaction.     Thereasa Solo

## 2017-11-11 NOTE — Anesthesia Procedure Notes (Signed)
Procedure Name: Intubation Date/Time: 11/11/2017 1:41 PM Performed by: Sharlette Dense, CRNA Patient Re-evaluated:Patient Re-evaluated prior to induction Oxygen Delivery Method: Circle system utilized Preoxygenation: Pre-oxygenation with 100% oxygen Induction Type: IV induction Ventilation: Mask ventilation without difficulty and Oral airway inserted - appropriate to patient size Laryngoscope Size: Sabra Heck and 2 Grade View: Grade I Tube type: Oral Tube size: 7.5 mm Number of attempts: 1 Airway Equipment and Method: Stylet Placement Confirmation: ETT inserted through vocal cords under direct vision,  positive ETCO2 and breath sounds checked- equal and bilateral Secured at: 20 cm Tube secured with: Tape Dental Injury: Teeth and Oropharynx as per pre-operative assessment

## 2017-11-11 NOTE — Op Note (Signed)
OPERATIVE NOTE 11/11/17  Surgeon: Donaciano Eva   Assistants: Dr Lahoma Crocker (an MD assistant was necessary for tissue manipulation, management of robotic instrumentation, retraction and positioning due to the complexity of the case and hospital policies).   Anesthesia: General endotracheal anesthesia  ASA Class: 3   Pre-operative Diagnosis: fibroids  Post-operative Diagnosis: fibroids  Operation: Robotic-assisted laparoscopic total hysterectomy >250gm with bilateral salpingoophorectomy   Surgeon: Donaciano Eva  Assistant Surgeon: Lahoma Crocker MD  Anesthesia: GET  Urine Output: 300cc  Operative Findings:  : 22cm uterus extending above umbilicus containing fibroids. Normal appearing ovaries bilaterally.   Estimated Blood Loss:  250cc      Total IV Fluids: 800 ml         Specimens: uterus with cervix, bilateral tubes and ovaries.         Complications:  None; patient tolerated the procedure well.         Disposition: PACU - hemodynamically stable.  Procedure Details  The patient was seen in the Holding Room. The risks, benefits, complications, treatment options, and expected outcomes were discussed with the patient.  The patient concurred with the proposed plan, giving informed consent.  The site of surgery properly noted/marked. The patient was identified as Carol Wells and the procedure verified as a Robotic-assisted hysterectomy with bilateral salpingo oophorectomy. A Time Out was held and the above information confirmed.  After induction of anesthesia, the patient was draped and prepped in the usual sterile manner. Pt was placed in supine position after anesthesia and draped and prepped in the usual sterile manner. The abdominal drape was placed after the CholoraPrep had been allowed to dry for 3 minutes.  Her arms were tucked to her side with all appropriate precautions.  The shoulders were stabilized with padded shoulder blocks applied to the  acromium processes.  The patient was placed in the semi-lithotomy position in [place].  The perineum was prepped with Betadine. The patient was then prepped. Foley catheter was placed.  A sterile speculum was placed in the vagina.  The cervix was grasped with a single-tooth tenaculum and dilated with Kennon Rounds dilators.  The ZUMI uterine manipulator with a medium colpotomizer ring was placed without difficulty.  A pneum occluder balloon was placed over the manipulator.  OG tube placement was confirmed and to suction.   Next, a 5 mm skin incision was made 1 cm below the subcostal margin in the midclavicular line.  The 5 mm Optiview port and scope was used for direct entry.  Opening pressure was under 10 mm CO2.  The abdomen was insufflated and the findings were noted as above.   At this point and all points during the procedure, the patient's intra-abdominal pressure did not exceed 15 mmHg. Next, a 10 mm skin incision was made in the umbilicus and a right and left port was placed about 10 cm lateral to the robot port on the right and left side.  A fourth arm was placed in the left lower quadrant 2 cm above and superior and medial to the anterior superior iliac spine.  All ports were placed under direct visualization.  The patient was placed in steep Trendelenburg.  Bowel was folded away into the upper abdomen.  The robot was docked in the normal manner.  The hysterectomy was started after the round ligament on the right side was incised and the retroperitoneum was entered and the pararectal space was developed.  The ureter was noted to be on the medial leaf  of the broad ligament.  The peritoneum above the ureter was incised and stretched and the infundibulopelvic ligament was skeletonized, cauterized and cut.  The posterior peritoneum was taken down to the level of the KOH ring.  The anterior peritoneum was also taken down.  The bladder flap was created to the level of the KOH ring.  The uterine artery on the  right side was skeletonized, cauterized and cut in the normal manner.  A similar procedure was performed on the left.  The colpotomy was made and the uterus, cervix, bilateral ovaries and tubes were amputated but could not be delivered through the vagina due to the large size of the uterus.  Pedicles were inspected and excellent hemostasis was achieved.    The colpotomy at the vaginal cuff was closed with Vicryl on a CT1 needle in a running manner.  Irrigation was used and excellent hemostasis was achieved.  At this point in the robotic procedure was completed.  Robotic instruments were removed under direct visulaization.  The robot was undocked.   A 6cm suprapubic low transverse incision was made with the scalpel. The subcutaneous skin was opened with the bovie. The fascia was opened with the bovie transversely and the rectus muscles were dissected off of the fascia inferiorally and superiorally. The peritoneum was opened sharply in the midline. The peritoneal incision was extended. The uterine specimen was grasped with a tenaculum. It was minimally morcellated (removed in 2 fragments) retrieved through the incision. There was a necrotic/degenerated fibroid encountered in the fundus during incision into the uterus. The pelvis was then copiously irrigated and swept to ensure there were no residual fragments. The uterine specimen included bilateral tubes and ovaries and they were visualized. The fascia was closed with running 0-vicryl. The subcutaneous fat was closed with 2-0 vicryl. 20cc of exparel mixed with 20cc of marcaine was infiltrated into the incision. The incision was closed at the skin with monocryl and dermabond.   The 10 mm ports were closed with Vicryl on a UR-5 needle and the fascia was closed with 0 Vicryl on a UR-5 needle.  The skin was closed with 4-0 Vicryl in a subcuticular manner.  Dermabond was applied.  Sponge, lap and needle counts correct x 2.  The patient was taken to the recovery room  in stable condition.  The vagina was swabbed with  minimal bleeding noted.   All instrument and needle counts were correct x  3.   The patient was transferred to the recovery room in a stable condition.  Donaciano Eva, MD

## 2017-11-11 NOTE — Anesthesia Postprocedure Evaluation (Signed)
Anesthesia Post Note  Patient: Carol Wells  Procedure(s) Performed: XI ROBOTIC ASSISTED TOTAL HYSTERECTOMY WITH BILATERAL SALPINGO OOPHORECTOMY FOR UTERUS GREATER THAN 250 GRAMS (Bilateral ) MINI LAPAROTOMY (N/A )     Patient location during evaluation: PACU Anesthesia Type: General Level of consciousness: awake and alert Pain management: pain level controlled Vital Signs Assessment: post-procedure vital signs reviewed and stable Respiratory status: spontaneous breathing, nonlabored ventilation, respiratory function stable and patient connected to nasal cannula oxygen Cardiovascular status: blood pressure returned to baseline and stable Postop Assessment: no apparent nausea or vomiting Anesthetic complications: no    Last Vitals:  Vitals:   11/11/17 1845 11/11/17 1951  BP: (!) 147/79 (!) 144/92  Pulse: 83 84  Resp: 16 18  Temp: (!) 36.4 C 36.4 C  SpO2: 99% 100%    Last Pain:  Vitals:   11/11/17 1951  TempSrc: Oral  PainSc:                  Sabriel Borromeo

## 2017-11-11 NOTE — Anesthesia Preprocedure Evaluation (Signed)
Anesthesia Evaluation  Patient identified by MRN, date of birth, ID band Patient awake    Reviewed: Allergy & Precautions, NPO status , Patient's Chart, lab work & pertinent test results  History of Anesthesia Complications Negative for: history of anesthetic complications  Airway Mallampati: II       Dental   Pulmonary    breath sounds clear to auscultation       Cardiovascular hypertension, Pt. on medications + Peripheral Vascular Disease   Rhythm:Regular Rate:Normal  Echocardiogram 06/21/12: Mild LVH, EF 76-16%, grade 2 diastolic dysfunction, mild MR  LHC 3/14: no angiographic CAD, EF 65%   Neuro/Psych  Headaches, Dopplers 0/73: RICA 71-06%, LICA 2-69%    GI/Hepatic PUD, GERD  Medicated,  Endo/Other  Hypothyroidism   Renal/GU      Musculoskeletal  (+) Arthritis ,   Abdominal   Peds  Hematology  (+) anemia ,   Anesthesia Other Findings   Reproductive/Obstetrics                             Anesthesia Physical  Anesthesia Plan  ASA: III  Anesthesia Plan: General   Post-op Pain Management:    Induction:   PONV Risk Score and Plan: 2 and Treatment may vary due to age or medical condition  Airway Management Planned: Oral ETT and LMA  Additional Equipment:   Intra-op Plan:   Post-operative Plan: Extubation in OR  Informed Consent: I have reviewed the patients History and Physical, chart, labs and discussed the procedure including the risks, benefits and alternatives for the proposed anesthesia with the patient or authorized representative who has indicated his/her understanding and acceptance.   Dental advisory given  Plan Discussed with: CRNA, Anesthesiologist and Surgeon  Anesthesia Plan Comments:         Anesthesia Quick Evaluation

## 2017-11-12 ENCOUNTER — Encounter (HOSPITAL_COMMUNITY): Payer: Self-pay | Admitting: Gynecologic Oncology

## 2017-11-12 DIAGNOSIS — N83202 Unspecified ovarian cyst, left side: Secondary | ICD-10-CM | POA: Diagnosis not present

## 2017-11-12 DIAGNOSIS — I10 Essential (primary) hypertension: Secondary | ICD-10-CM | POA: Diagnosis not present

## 2017-11-12 DIAGNOSIS — R350 Frequency of micturition: Secondary | ICD-10-CM | POA: Diagnosis not present

## 2017-11-12 DIAGNOSIS — N83201 Unspecified ovarian cyst, right side: Secondary | ICD-10-CM | POA: Diagnosis not present

## 2017-11-12 DIAGNOSIS — Z79899 Other long term (current) drug therapy: Secondary | ICD-10-CM | POA: Diagnosis not present

## 2017-11-12 DIAGNOSIS — D259 Leiomyoma of uterus, unspecified: Secondary | ICD-10-CM | POA: Diagnosis not present

## 2017-11-12 LAB — CBC
HEMATOCRIT: 33.2 % — AB (ref 36.0–46.0)
Hemoglobin: 11.5 g/dL — ABNORMAL LOW (ref 12.0–15.0)
MCH: 32 pg (ref 26.0–34.0)
MCHC: 34.6 g/dL (ref 30.0–36.0)
MCV: 92.5 fL (ref 78.0–100.0)
Platelets: 291 10*3/uL (ref 150–400)
RBC: 3.59 MIL/uL — ABNORMAL LOW (ref 3.87–5.11)
RDW: 13 % (ref 11.5–15.5)
WBC: 9.5 10*3/uL (ref 4.0–10.5)

## 2017-11-12 LAB — BASIC METABOLIC PANEL
ANION GAP: 7 (ref 5–15)
BUN: 10 mg/dL (ref 8–23)
CALCIUM: 7.6 mg/dL — AB (ref 8.9–10.3)
CO2: 23 mmol/L (ref 22–32)
Chloride: 97 mmol/L — ABNORMAL LOW (ref 98–111)
Creatinine, Ser: 0.7 mg/dL (ref 0.44–1.00)
GFR calc Af Amer: >60 mL/min (ref >60)
GFR calc non Af Amer: >60 mL/min (ref >60)
Glucose, Bld: 165 mg/dL — ABNORMAL HIGH (ref 70–99)
Potassium: 4.5 mmol/L (ref 3.5–5.1)
Sodium: 127 mmol/L — ABNORMAL LOW (ref 135–145)

## 2017-11-12 MED ORDER — TRAMADOL HCL 50 MG PO TABS: 50.0000 mg | ORAL_TABLET | Freq: Four times a day (QID) | ORAL | 0 refills | Status: DC | PRN

## 2017-11-12 NOTE — Progress Notes (Signed)
Pt was alert and oriented, tolerating diet.  D/C instructions were given, as well as a container to measure her urine. Pt was d/cd home with spouse.

## 2017-11-12 NOTE — Discharge Summary (Signed)
Physician Discharge Summary  Patient ID: Carol Wells MRN: [id number] DOB/AGE: 1931-12-30 84 y.o.  Admit date: 11/11/2017 Discharge date: 11/12/2017  Admission Diagnoses: Fibroids  Discharge Diagnoses:  Principal Problem:   Fibroids Active Problems:   Fibroid uterus   Discharged Condition:  The patient is in good condition and stable for discharge.    Hospital Course: On 11/11/2017, the patient underwent the following: Procedure(s): XI ROBOTIC ASSISTED TOTAL HYSTERECTOMY WITH BILATERAL SALPINGO OOPHORECTOMY FOR UTERUS GREATER THAN 250 GRAMS MINI LAPAROTOMY.  The postoperative course was uneventful.  She was discharged to home on postoperative day 1 tolerating a regular diet, ambulating, pain controlled, voiding.  Consults: None  Significant Diagnostic Studies: None  Treatments: surgery: see above  Discharge Exam: Blood pressure (!) 107/59, pulse 72, temperature 98.2 F (36.8 C), temperature source Oral, resp. rate 16, height 5\' 1"  (1.549 m), weight 136 lb (61.7 kg), SpO2 97 %. General appearance: alert, cooperative and no distress Resp: clear to auscultation bilaterally Cardio: regular rate and rhythm, S1, S2 normal, no murmur, click, rub or gallop GI: soft, non-tender; bowel sounds normal; no masses,  no organomegaly Extremities: extremities normal, atraumatic, no cyanosis or edema Incision/Wound: Lap sites to the abdomen with dermabond without drainage, mild surrounding ecchymosis.  Low transverse incision with op site dressing in place over mini lap incision without evidence of drainage underneath.  Disposition: Discharge disposition: 01-Home or Self Care       Discharge Instructions    Call MD for:  difficulty breathing, headache or visual disturbances   Complete by:  As directed    Call MD for:  extreme fatigue   Complete by:  As directed    Call MD for:  hives   Complete by:  As directed    Call MD for:  persistant dizziness or light-headedness   Complete by:   As directed    Call MD for:  persistant nausea and vomiting   Complete by:  As directed    Call MD for:  redness, tenderness, or signs of infection (pain, swelling, redness, odor or green/yellow discharge around incision site)   Complete by:  As directed    Call MD for:  severe uncontrolled pain   Complete by:  As directed    Call MD for:  temperature >100.4   Complete by:  As directed    Diet - low sodium heart healthy   Complete by:  As directed    Discharge wound care:   Complete by:  As directed    Remove low transverse dressing five days from surgery.  No dressing needs to be reapplied.   Driving Restrictions   Complete by:  As directed    No driving for 1 week if cleared to drive before surgery.  Do not take narcotics and drive.   Increase activity slowly   Complete by:  As directed    Lifting restrictions   Complete by:  As directed    No lifting greater than 10 lbs.   Sexual Activity Restrictions   Complete by:  As directed    No sexual activity, nothing in the vagina, for 8 weeks.     Allergies as of 11/12/2017   No Known Allergies     Medication List    STOP taking these medications   norethindrone 5 MG tablet Commonly known as:  AYGESTIN     TAKE these medications   atorvastatin 10 MG tablet Commonly known as:  LIPITOR TAKE 1 TABLET BY MOUTH DAILY. GENERIC EQUIVALENT  FOR LIPITOR What changed:  See the new instructions.   budesonide 3 MG 24 hr capsule Commonly known as:  ENTOCORT EC Take 2 capsules (6 mg total) by mouth daily. What changed:    how much to take  when to take this   CALCIUM 600/VITAMIN D PO Take 1 tablet by mouth daily.   clobetasol ointment 0.05 % Commonly known as:  TEMOVATE Apply 1 application topically as needed (lichen sclerosus).   estradiol 0.5 MG tablet Commonly known as:  ESTRACE Take 0.5 mg by mouth at bedtime.   ferrous sulfate 325 (65 FE) MG tablet Take 325 mg by mouth every other day.   latanoprost 0.005 %  ophthalmic solution Commonly known as:  XALATAN Place 1 drop into both eyes at bedtime.   levothyroxine 125 MCG tablet Commonly known as:  SYNTHROID, LEVOTHROID Take 125 mcg by mouth daily.   losartan-hydrochlorothiazide 100-12.5 MG tablet Commonly known as:  HYZAAR Take 1 tablet by mouth daily.   multivitamin tablet Take 1 tablet by mouth daily at 12 noon.   pantoprazole 40 MG tablet Commonly known as:  PROTONIX Take 1 tablet (40 mg total) by mouth daily. What changed:  when to take this   traMADol 50 MG tablet Commonly known as:  ULTRAM Take 1 tablet (50 mg total) by mouth every 6 (six) hours as needed for severe pain.   vitamin C 500 MG tablet Commonly known as:  ASCORBIC ACID Take 500 mg by mouth daily at 12 noon.            Discharge Care Instructions  (From admission, onward)        Start     Ordered   11/12/17 0000  Discharge wound care:    Comments:  Remove low transverse dressing five days from surgery.  No dressing needs to be reapplied.   11/12/17 0933     Follow-up Information    Everitt Amber, MD Follow up on 11/26/2017.   Specialty:  Gynecologic Oncology Why:  at 3:15pm at the [facility] information: [place] [address] [phone]           Greater than thirty minutes were spend for face to face discharge instructions and discharge orders/summary in EPIC.   Signed: Dorothyann Gibbs 11/12/2017, 9:35 AM

## 2017-11-12 NOTE — Discharge Instructions (Signed)
11/11/2017  Return to work: 4 weeks  Activity: 1. Be up and out of the bed during the day.  Take a nap if needed.  You may walk up steps but be careful and use the hand rail.  Stair climbing will tire you more than you think, you may need to stop part way and rest.   2. No lifting or straining for 6 weeks.  3. No driving for 1 weeks.  Do Not drive if you are taking narcotic pain medicine.  4. Shower daily.  Use soap and water on your incision and pat dry; don't rub.   5. No sexual activity and nothing in the vagina for 8 weeks.  Medications:  - Take tylenol first line for pain control. Take these regularly (every 6 hours) to decrease the build up of pain.  - If necessary, for severe pain not relieved by tylenol, take tramadol.  - While taking tramadol you should take sennakot every night to reduce the likelihood of constipation. If this causes diarrhea, stop its use.  Diet: 1. Low sodium Heart Healthy Diet is recommended.  2. It is safe to use a laxative if you have difficulty moving your bowels.   Wound Care: 1. Keep clean and dry.  Shower daily.  Reasons to call the Doctor:   Fever - Oral temperature greater than 100.4 degrees Fahrenheit  Foul-smelling vaginal discharge  Difficulty urinating  Nausea and vomiting  Increased pain at the site of the incision that is unrelieved with pain medicine.  Difficulty breathing with or without chest pain  New calf pain especially if only on one side  Sudden, continuing increased vaginal bleeding with or without clots.   Follow-up: 1. See Everitt Amber in 4 weeks.  Contacts: For questions or concerns you should contact:  Dr. Everitt Amber at [phone] After hours and on week-ends call [phone] and ask to speak to the physician on call for Gynecologic Oncology

## 2017-11-12 NOTE — Progress Notes (Signed)
Per Dr. Denman George, patient can go home without catheter with the recommendation to push fluids and monitor urine output.

## 2017-11-17 ENCOUNTER — Telehealth: Payer: Self-pay | Admitting: *Deleted

## 2017-11-17 ENCOUNTER — Other Ambulatory Visit: Payer: Self-pay | Admitting: Internal Medicine

## 2017-11-17 NOTE — Telephone Encounter (Signed)
Patient returned my call.  I had called patient prior to give her the results to her surgical pathology report.  Per Joylene John, NP.  No cancer on final report.  Patient states " I am doing very good after surgery, a little discomfort at times, but that is to be expected, I am walking good, no problems at this time".  Patient was very pleased with the results from the pathology report, patient verbalized understanding. I told patient to feel free to call our office with any questions or concerns.  Patient verbalized understanding.

## 2017-11-17 NOTE — Telephone Encounter (Signed)
I called patient to give her results to her surgical pathology report. No answer, I left patient a message to give our office a call back.

## 2017-11-26 ENCOUNTER — Inpatient Hospital Stay (HOSPITAL_BASED_OUTPATIENT_CLINIC_OR_DEPARTMENT_OTHER): Payer: Medicare Other | Admitting: Gynecologic Oncology

## 2017-11-26 ENCOUNTER — Encounter: Payer: Self-pay | Admitting: Gynecologic Oncology

## 2017-11-26 VITALS — BP 145/73 | HR 78 | Temp 98.2°F | Resp 18 | Ht 61.0 in | Wt 129.0 lb

## 2017-11-26 DIAGNOSIS — D259 Leiomyoma of uterus, unspecified: Secondary | ICD-10-CM

## 2017-11-26 DIAGNOSIS — D219 Benign neoplasm of connective and other soft tissue, unspecified: Secondary | ICD-10-CM

## 2017-11-26 MED ORDER — TRAMADOL HCL 50 MG PO TABS: 50.0000 mg | ORAL_TABLET | Freq: Four times a day (QID) | ORAL | 0 refills | Status: DC | PRN

## 2017-11-26 NOTE — Progress Notes (Signed)
Follow-up Note: Gyn-Onc  Consult was requested by Dr. Ronita Hipps for the evaluation of Carol Wells 84 y.o. female  CC:  Chief Complaint  Patient presents with  . Fibroids    Assessment/Plan:  Carol Wells  is a 84 y.o.  year old with history of a symptomatic 20+cm fibroid uterus s/p robotic assisted total hysterectomy for a uterus >250gm, BSO on 11/11/17.   Healing normally after surgery. No follow-up necessary. She can discontinue her progesterone menopausal replacement therapy.  Tramadol refilled.  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  The patient has had a known fibroid uterus for many years. It has gradually grown in size and become increasingly symptomatic with urinary frequency.  She had an MRI in 2015 which showed a uterus measuring 13.8x10.5x11.5cm. CT scan in 2017 showed some interval growth (largest dimension 21cm). Repeat MRI on 09/07/17 showed a uterus measuring 21.1x12.2x15.1cm with a large fibroid again seen in the anterior corpus and fundus which shows heterogeneous contrast enhancement and areas of cystic degeneration. This measures 17.1x11.5x14.0cm and has not significantly changed in size since 2017 CT. Although it does show increased size compared to earlier MRI in 2015. This mass continues to show well-circumscribed margins without evidence of invasion of adjacent structures. Endometrium is not visualized due to compression by the large fibroid. Cervix is normal.  She consulted with IR regarding embolization, but they felt this was contraindicated due to fibroid size and her age.  She was seen at [facility] for an opinion regarding MIS surgery, however, after reviewing the MRI, they felt that it should be managed by an oncologic surgeon.  Carol Wells reports intermittent vaginal spotting for many years. It is occasional (approximately every 6 months). She has  urinary frequency. She is otherwise very healthy with good activity levels. She has had 2 vaginal deliveries and no prior abdominal surgeries.   Preop biopsy showed benign pathology from the endometrium.  Interval Hx:  On 11/11/17 she underwent a robotic assisted total hysterectomy >250gm and bilateral salpingo-oophorectomy. A minilaparotomy was necessary for specimen retrieval. Final pathology revealed benign leiomyomatous disease weighing 1005gms.   Postoperatively she did well with no complaints other than bruising in the flank. She has some soreness including with urination but denies true dysuria.  Current Meds:  Outpatient Encounter Medications as of 11/26/2017  Medication Sig  . atorvastatin (LIPITOR) 10 MG tablet TAKE 1 TABLET BY MOUTH DAILY. GENERIC EQUIVALENT FOR LIPITOR (Patient taking differently: TAKE 1/2 TABLET BY MOUTH DAILY AT BEDTIME. GENERIC EQUIVALENT FOR LIPITOR)  . budesonide (ENTOCORT EC) 3 MG 24 hr capsule TAKE 2 CAPSULES BY MOUTH ONCE DAILY  . Calcium Carbonate-Vitamin D (CALCIUM 600/VITAMIN D PO) Take 1 tablet by mouth daily.   . clobetasol ointment (TEMOVATE) 4.43 % Apply 1 application topically as needed (lichen sclerosus).   Marland Kitchen estradiol (ESTRACE) 0.5 MG tablet Take 0.5 mg by mouth at bedtime.   . ferrous sulfate 325 (65 FE) MG tablet Take 325 mg by mouth every other day.  . latanoprost (XALATAN) 0.005 % ophthalmic solution Place 1 drop into both eyes at bedtime.    Marland Kitchen levothyroxine (SYNTHROID, LEVOTHROID) 125 MCG tablet Take 125 mcg by mouth daily.  Marland Kitchen losartan-hydrochlorothiazide (HYZAAR) 100-12.5 MG tablet Take 1 tablet by mouth daily.  . Multiple Vitamin (MULTIVITAMIN) tablet Take 1 tablet by mouth daily at 12 noon.   . pantoprazole (  PROTONIX) 40 MG tablet Take 1 tablet (40 mg total) by mouth daily. (Patient taking differently: Take 40 mg by mouth every other day. )  . traMADol (ULTRAM) 50 MG tablet Take 1 tablet (50 mg total) by mouth every 6 (six) hours as  needed for severe pain.  . vitamin C (ASCORBIC ACID) 500 MG tablet Take 500 mg by mouth daily at 12 noon.    No facility-administered encounter medications on file as of 11/26/2017.     Allergy: No Known Allergies  Social Hx:   Social History   Socioeconomic History  . Marital status: Married    Spouse name: Not on file  . Number of children: 2  . Years of education: Not on file  . Highest education level: Not on file  Occupational History  . Occupation: Retired Animal nutritionist  . Financial resource strain: Not on file  . Food insecurity:    Worry: Not on file    Inability: Not on file  . Transportation needs:    Medical: Not on file    Non-medical: Not on file  Tobacco Use  . Smoking status: Never Smoker  . Smokeless tobacco: Never Used  Substance and Sexual Activity  . Alcohol use: Yes    Comment: occ  . Drug use: Never  . Sexual activity: Not Currently  Lifestyle  . Physical activity:    Days per week: Not on file    Minutes per session: Not on file  . Stress: Not on file  Relationships  . Social connections:    Talks on phone: Not on file    Gets together: Not on file    Attends religious service: Not on file    Active member of club or organization: Not on file    Attends meetings of clubs or organizations: Not on file    Relationship status: Not on file  . Intimate partner violence:    Fear of current or ex partner: Not on file    Emotionally abused: Not on file    Physically abused: Not on file    Forced sexual activity: Not on file  Other Topics Concern  . Not on file  Social History Narrative  . Not on file    Past Surgical Hx:  Past Surgical History:  Procedure Laterality Date  . BUNIONECTOMY  YRS AGO LEFT   RIGHT [facility] 3 YRS AGO  . CATARACT EXTRACTION W/ INTRAOCULAR LENS IMPLANT  03/2010   Left  . CATARACT EXTRACTION W/ INTRAOCULAR LENS IMPLANT Bilateral 11/2010  . COLONOSCOPY  2013   NORMAL   . ESOPHAGOGASTRODUODENOSCOPY N/A  11/23/2015   Procedure: ESOPHAGOGASTRODUODENOSCOPY (EGD);  Surgeon: Rogene Houston, MD;  [facility];  Service: Endoscopy;  Laterality: N/A;  . EUS N/A 12/19/2015   Procedure: ESOPHAGEAL ENDOSCOPIC ULTRASOUND (EUS) RADIAL;  Surgeon: Milus Banister, MD;  Location: [facility];  Service: Endoscopy;  Laterality: N/A;  . FUNCTIONAL ENDOSCOPIC SINUS SURGERY    . HAND SURGERY Right 1998  . INGUINAL HERNIA REPAIR Left 03/11/2016   Procedure: LEFT INGUINAL HERINA REPAIR WITH MESH;  Surgeon: Donnie Mesa, MD;  Location: [place];  Service: General;  Laterality: Left;  . INSERTION OF MESH Left 03/11/2016   Procedure: INSERTION OF MESH;  Surgeon: Donnie Mesa, MD;  Location: [place];  Service: General;  Laterality: Left;  . IR RADIOLOGIST EVAL & MGMT  07/01/2017  . LAMINECTOMY  03/2008  . LAPAROTOMY N/A 11/11/2017   Procedure: MINI LAPAROTOMY;  Surgeon: Everitt Amber, MD;  Location: [facility];  Service: Gynecology;  Laterality: N/A;  . POLYPECTOMY    . ROBOTIC ASSISTED TOTAL HYSTERECTOMY WITH BILATERAL SALPINGO OOPHERECTOMY Bilateral 11/11/2017   Procedure: XI ROBOTIC ASSISTED TOTAL HYSTERECTOMY WITH BILATERAL SALPINGO OOPHORECTOMY FOR UTERUS GREATER THAN 250 GRAMS;  Surgeon: Everitt Amber, MD;  Location: [facility];  Service: Gynecology;  Laterality: Bilateral;  . TOTAL HIP ARTHROPLASTY  2008   Right    Past Medical Hx:  Past Medical History:  Diagnosis Date  . Anemia   . Arthritis   . Blood transfusion 2017 JULY 22  . Carotid stenosis    Dopplers 8/65: RICA 78-46%, LICA 9-62%  . Cataract    REMOVED  . Chronic headaches    HX MIGRAINES  . Collagenous colitis   . Diverticulosis 2013   Colonoscopy  . Gastric ulcer   . Glaucoma    BOTH EYES  . Hx of cardiac catheterization    LHC 3/14: no angiographic CAD, EF 65%  . Hx of echocardiogram    Echocardiogram 06/21/12: Mild LVH, EF 95-28%, grade 2 diastolic dysfunction, mild MR  . Hyperlipidemia   . Hypertension   . Hypothyroidism   . Internal  hemorrhoids 2009   Colonoscopy  . UTI (urinary tract infection) HX OF    Past Gynecological History:  No abnormal paps, 2xSVD No LMP recorded. Patient is postmenopausal.  Family Hx:  Family History  Problem Relation Age of Onset  . Colon cancer Brother   . Colon polyps Brother     Review of Systems:  Constitutional  Feels well,    ENT Normal appearing ears and nares bilaterally Skin/Breast  No rash, sores, jaundice, itching, dryness Cardiovascular  No chest pain, shortness of breath, or edema  Pulmonary  No cough or wheeze.  Gastro Intestinal  No nausea, vomitting, or diarrhoea. No bright red blood per rectum, no abdominal pain, change in bowel movement, or constipation.  Genito Urinary  + frequency, no urgency or dysuria,  Musculo Skeletal  No myalgia, arthralgia, joint swelling or pain  Neurologic  No weakness, numbness, change in gait,  Psychology  No depression, anxiety, insomnia.   Vitals:  Blood pressure (!) 145/73, pulse 78, temperature 98.2 F (36.8 C), temperature source Oral, resp. rate 18, height 5\' 1"  (1.549 m), weight 129 lb (58.5 kg), SpO2 98 %.  Physical Exam: WD in NAD Neck  Supple NROM, without any enlargements.  Lymph Node Survey No cervical supraclavicular or inguinal adenopathy Cardiovascular  Pulse normal rate, regularity and rhythm. S1 and S2 normal.  Lungs  Clear to auscultation bilateraly, without wheezes/crackles/rhonchi. Good air movement.  Skin  No rash/lesions/breakdown  Psychiatry  Alert and oriented to person, place, and time  Abdomen  Normoactive bowel sounds, abdomen soft, non-tender and thin without evidence of hernia. Incisions well healed. Considerable old hematoma tracking in right flank. No masses. Back No CVA tenderness Genito Urinary  Vaginal cuff healing normally, suture material in place. No blood.  Rectal  deferred Extremities  No bilateral cyanosis, clubbing or edema.   Thereasa Solo, MD  11/26/2017, 3:48  PM

## 2017-11-26 NOTE — Patient Instructions (Addendum)
Please contact Dr Serita Grit office at [phone] if you have any questions or concerns. Otherwise, scheduled follow-up is not necessary.   You should continue to lift only what you can with one hand for an additional 2 weeks.  Please return to see Dr Ronita Hipps as planned in the new year for wellness care.

## 2017-12-27 DIAGNOSIS — Z961 Presence of intraocular lens: Secondary | ICD-10-CM | POA: Diagnosis not present

## 2017-12-27 DIAGNOSIS — H401411 Capsular glaucoma with pseudoexfoliation of lens, right eye, mild stage: Secondary | ICD-10-CM | POA: Diagnosis not present

## 2017-12-31 DIAGNOSIS — M5416 Radiculopathy, lumbar region: Secondary | ICD-10-CM | POA: Diagnosis not present

## 2018-01-25 ENCOUNTER — Other Ambulatory Visit: Payer: Self-pay | Admitting: Internal Medicine

## 2018-03-02 DIAGNOSIS — Z23 Encounter for immunization: Secondary | ICD-10-CM | POA: Diagnosis not present

## 2018-03-29 DIAGNOSIS — M19041 Primary osteoarthritis, right hand: Secondary | ICD-10-CM | POA: Diagnosis not present

## 2018-04-21 DIAGNOSIS — L821 Other seborrheic keratosis: Secondary | ICD-10-CM | POA: Diagnosis not present

## 2018-04-21 DIAGNOSIS — L308 Other specified dermatitis: Secondary | ICD-10-CM | POA: Diagnosis not present

## 2018-05-12 DIAGNOSIS — M25511 Pain in right shoulder: Secondary | ICD-10-CM | POA: Diagnosis not present

## 2018-05-12 DIAGNOSIS — M25512 Pain in left shoulder: Secondary | ICD-10-CM | POA: Diagnosis not present

## 2018-05-12 DIAGNOSIS — M19111 Post-traumatic osteoarthritis, right shoulder: Secondary | ICD-10-CM | POA: Diagnosis not present

## 2018-05-16 DIAGNOSIS — M5416 Radiculopathy, lumbar region: Secondary | ICD-10-CM | POA: Diagnosis not present

## 2018-05-16 DIAGNOSIS — Z79891 Long term (current) use of opiate analgesic: Secondary | ICD-10-CM | POA: Diagnosis not present

## 2018-06-27 DIAGNOSIS — M25811 Other specified joint disorders, right shoulder: Secondary | ICD-10-CM | POA: Diagnosis not present

## 2018-06-27 DIAGNOSIS — M25511 Pain in right shoulder: Secondary | ICD-10-CM | POA: Diagnosis not present

## 2018-07-04 DIAGNOSIS — H401411 Capsular glaucoma with pseudoexfoliation of lens, right eye, mild stage: Secondary | ICD-10-CM | POA: Diagnosis not present

## 2018-08-25 ENCOUNTER — Inpatient Hospital Stay: Admit: 2018-08-25 | Payer: Medicare Other | Admitting: Orthopedic Surgery

## 2018-08-25 SURGERY — ARTHROPLASTY, SHOULDER, TOTAL, REVERSE
Anesthesia: General | Laterality: Right

## 2018-09-29 DIAGNOSIS — M5416 Radiculopathy, lumbar region: Secondary | ICD-10-CM | POA: Diagnosis not present

## 2018-10-05 NOTE — Patient Instructions (Addendum)
AKIBA MELFI  10/05/2018   Your procedure is scheduled on: Thursday 10/13/2018  Report to [facility] Main  Entrance             Report to admitting at  [place] 19 TEST ON October 10, 2018 @ 10:15AM, THIS TEST MUST BE DONE BEFORE SURGERY, COME TO [place] ENTRANCE.    Call this number if you have problems the morning of surgery [phone]    Remember: Do not eat food or drink liquids :After Midnight.               NO SOLID FOOD AFTER MIDNIGHT THE NIGHT PRIOR TO SURGERY. NOTHING BY MOUTH EXCEPT CLEAR LIQUIDS UNTIL 0430 am.               PLEASE FINISH ENSURE DRINK PER SURGEON ORDER WHICH NEEDS TO BE COMPLETED AT  0430 am.       CLEAR LIQUID DIET   Foods Allowed                                                                     Foods Excluded  Coffee and tea, regular and decaf                             liquids that you cannot  Plain Jell-O in any flavor                                             see through such as: Fruit ices (not with fruit pulp)                                     milk, soups, orange juice  Iced Popsicles                                    All solid food Carbonated beverages, regular and diet                                    Cranberry, grape and apple juices Sports drinks like Gatorade Lightly seasoned clear broth or consume(fat free) Sugar, honey syrup  Sample Menu Breakfast                                Lunch                                     Supper Cranberry juice  Beef broth                            Chicken broth Jell-O                                     Grape juice                           Apple juice Coffee or tea                        Jell-O                                      Popsicle                                                Coffee or tea                        Coffee or  tea  _____________________________________________________________________               BRUSH YOUR TEETH MORNING OF SURGERY AND RINSE YOUR MOUTH OUT, NO CHEWING GUM CANDY OR MINTS.     Take these medicines the morning of surgery with A SIP OF WATER:Levothyroxine (Synthroid)                                  You may not have any metal on your body including hair pins and              piercings  Do not wear jewelry, make-up, lotions, powders or perfumes, deodorant             Do not wear nail polish.  Do not shave  48 hours prior to surgery.             Do not bring valuables to the hospital. [place].  Contacts, dentures or bridgework may not be worn into surgery.  Leave suitcase in the car. After surgery it may be brought to your room.                  Please read over the following fact sheets you were given: _____________________________________________________________________             [facility] - Preparing for Surgery Before surgery, you can play an important role.  Because skin is not sterile, your skin needs to be as free of germs as possible.  You can reduce the number of germs on your skin by washing with CHG (chlorahexidine gluconate) soap before surgery.  CHG is an antiseptic cleaner which kills germs and bonds with the skin to continue killing germs even after washing. Please DO NOT use if you have an allergy to CHG or antibacterial soaps.  If your skin becomes reddened/irritated stop using the CHG and inform your nurse when you arrive at Short Stay.  Do not shave (including legs and underarms) for at least 48 hours prior to the first CHG shower.  You may shave your face/neck. Please follow these instructions carefully:  1.  Shower with CHG Soap the night before surgery and the  morning of Surgery.  2.  If you choose to wash your hair, wash your hair first as usual with your  normal  shampoo.  3.  After you shampoo,  rinse your hair and body thoroughly to remove the  shampoo.                           4.  Use CHG as you would any other liquid soap.  You can apply chg directly  to the skin and wash                       Gently with a scrungie or clean washcloth.  5.  Apply the CHG Soap to your body ONLY FROM THE NECK DOWN.   Do not use on face/ open                           Wound or open sores. Avoid contact with eyes, ears mouth and genitals (private parts).                       Wash face,  Genitals (private parts) with your normal soap.             6.  Wash thoroughly, paying special attention to the area where your surgery  will be performed.  7.  Thoroughly rinse your body with warm water from the neck down.  8.  DO NOT shower/wash with your normal soap after using and rinsing off  the CHG Soap.                9.  Pat yourself dry with a clean towel.            10.  Wear clean pajamas.            11.  Place clean sheets on your bed the night of your first shower and do not  sleep with pets. Day of Surgery : Do not apply any lotions/deodorants the morning of surgery.  Please wear clean clothes to the hospital/surgery center.  FAILURE TO FOLLOW THESE INSTRUCTIONS MAY RESULT IN THE CANCELLATION OF YOUR SURGERY PATIENT SIGNATURE_________________________________  NURSE SIGNATURE__________________________________  ________________________________________________________________________   Adam Phenix  An incentive spirometer is a tool that can help keep your lungs clear and active. This tool measures how well you are filling your lungs with each breath. Taking long deep breaths may help reverse or decrease the chance of developing breathing (pulmonary) problems (especially infection) following:  A long period of time when you are unable to move or be active. BEFORE THE PROCEDURE   If the spirometer includes an indicator to show your best effort, your nurse or respiratory therapist will set it  to a desired goal.  If possible, sit up straight or lean slightly forward. Try not to slouch.  Hold the incentive spirometer in an upright position. INSTRUCTIONS FOR USE  1. Sit on the edge of your bed if possible, or sit up as far as you can in bed or on a chair. 2. Hold the incentive spirometer in an upright position. 3. Breathe out normally. 4. Place the mouthpiece  in your mouth and seal your lips tightly around it. 5. Breathe in slowly and as deeply as possible, raising the piston or the ball toward the top of the column. 6. Hold your breath for 3-5 seconds or for as long as possible. Allow the piston or ball to fall to the bottom of the column. 7. Remove the mouthpiece from your mouth and breathe out normally. 8. Rest for a few seconds and repeat Steps 1 through 7 at least 10 times every 1-2 hours when you are awake. Take your time and take a few normal breaths between deep breaths. 9. The spirometer may include an indicator to show your best effort. Use the indicator as a goal to work toward during each repetition. 10. After each set of 10 deep breaths, practice coughing to be sure your lungs are clear. If you have an incision (the cut made at the time of surgery), support your incision when coughing by placing a pillow or rolled up towels firmly against it. Once you are able to get out of bed, walk around indoors and cough well. You may stop using the incentive spirometer when instructed by your caregiver.  RISKS AND COMPLICATIONS  Take your time so you do not get dizzy or light-headed.  If you are in pain, you may need to take or ask for pain medication before doing incentive spirometry. It is harder to take a deep breath if you are having pain. AFTER USE  Rest and breathe slowly and easily.  It can be helpful to keep track of a log of your progress. Your caregiver can provide you with a simple table to help with this. If you are using the spirometer at home, follow these  instructions: [place] IF:   You are having difficultly using the spirometer.  You have trouble using the spirometer as often as instructed.  Your pain medication is not giving enough relief while using the spirometer.  You develop fever of 100.5 F (38.1 C) or higher. SEEK IMMEDIATE MEDICAL CARE IF:   You cough up bloody sputum that had not been present before.  You develop fever of 102 F (38.9 C) or greater.  You develop worsening pain at or near the incision site. MAKE SURE YOU:   Understand these instructions.  Will watch your condition.  Will get help right away if you are not doing well or get worse. Document Released: 08/31/2006 Document Revised: 07/13/2011 Document Reviewed: 11/01/2006 [facility] Patient Information 2014 [place], [place].   ________________________________________________________________________

## 2018-10-05 NOTE — Progress Notes (Signed)
11/10/2017- noted in Epic -EKG

## 2018-10-06 ENCOUNTER — Encounter (HOSPITAL_COMMUNITY): Payer: Self-pay

## 2018-10-06 ENCOUNTER — Other Ambulatory Visit: Payer: Self-pay

## 2018-10-06 ENCOUNTER — Encounter (HOSPITAL_COMMUNITY)
Admission: RE | Admit: 2018-10-06 | Discharge: 2018-10-06 | Disposition: A | Discharge status: Home / Self Care | Payer: Medicare Other | Source: Ambulatory Visit | Attending: Orthopedic Surgery | Admitting: Orthopedic Surgery

## 2018-10-06 DIAGNOSIS — Z01812 Encounter for preprocedural laboratory examination: Secondary | ICD-10-CM | POA: Insufficient documentation

## 2018-10-06 HISTORY — DX: Personal history of other diseases of the musculoskeletal system and connective tissue: Z87.39

## 2018-10-06 HISTORY — DX: Personal history of other benign neoplasm: Z86.018

## 2018-10-06 LAB — BASIC METABOLIC PANEL
Anion gap: 8 (ref 5–15)
BUN: 17 mg/dL (ref 8–23)
CO2: 27 mmol/L (ref 22–32)
Calcium: 9 mg/dL (ref 8.9–10.3)
Chloride: 100 mmol/L (ref 98–111)
Creatinine, Ser: 0.57 mg/dL (ref 0.44–1.00)
GFR calc Af Amer: >60 mL/min (ref >60)
GFR calc non Af Amer: >60 mL/min (ref >60)
Glucose, Bld: 95 mg/dL (ref 70–99)
Potassium: 3.9 mmol/L (ref 3.5–5.1)
Sodium: 135 mmol/L (ref 135–145)

## 2018-10-06 LAB — CBC
HCT: 40.6 % (ref 36.0–46.0)
Hemoglobin: 13.1 g/dL (ref 12.0–15.0)
MCH: 29.9 pg (ref 26.0–34.0)
MCHC: 32.3 g/dL (ref 30.0–36.0)
MCV: 92.7 fL (ref 80.0–100.0)
Platelets: 309 10*3/uL (ref 150–400)
RBC: 4.38 MIL/uL (ref 3.87–5.11)
RDW: 13 % (ref 11.5–15.5)
WBC: 5.6 10*3/uL (ref 4.0–10.5)
nRBC: 0 % (ref 0.0–0.2)

## 2018-10-06 LAB — SURGICAL PCR SCREEN
MRSA, PCR: NEGATIVE
Staphylococcus aureus: NEGATIVE

## 2018-10-10 ENCOUNTER — Other Ambulatory Visit: Payer: Self-pay

## 2018-10-10 ENCOUNTER — Other Ambulatory Visit (HOSPITAL_COMMUNITY)
Admission: RE | Admit: 2018-10-10 | Discharge: 2018-10-10 | Disposition: A | Discharge status: Home / Self Care | Payer: Medicare Other | Source: Ambulatory Visit | Attending: Orthopedic Surgery | Admitting: Orthopedic Surgery

## 2018-10-10 DIAGNOSIS — Z1159 Encounter for screening for other viral diseases: Secondary | ICD-10-CM | POA: Insufficient documentation

## 2018-10-11 LAB — NOVEL CORONAVIRUS, NAA (HOSP ORDER, SEND-OUT TO REF LAB; TAT 18-24 HRS): SARS-CoV-2, NAA: NOT DETECTED

## 2018-10-12 NOTE — Progress Notes (Signed)
SPOKE W/  _[name]     SCREENING SYMPTOMS OF COVID 19:   COUGH-- no  RUNNY NOSE--- no  SORE THROAT---no  NASAL CONGESTION----no  SNEEZING----no  SHORTNESS OF BREATH---no  DIFFICULTY BREATHING---no TEMP >100.0 -----no  UNEXPLAINED BODY ACHES------no  CHILLS -------- no  HEADACHES ---------no  LOSS OF SMELL/ TASTE --------no    HAVE YOU OR ANY FAMILY MEMBER TRAVELLED PAST 14 DAYS OUT OF THE   COUNTY---no STATE----no COUNTRY----no  HAVE YOU OR ANY FAMILY MEMBER BEEN EXPOSED TO ANYONE WITH COVID 19? no

## 2018-10-13 ENCOUNTER — Encounter (HOSPITAL_COMMUNITY): Payer: Self-pay | Admitting: Emergency Medicine

## 2018-10-13 ENCOUNTER — Other Ambulatory Visit: Payer: Self-pay

## 2018-10-13 ENCOUNTER — Encounter (HOSPITAL_COMMUNITY)
Admission: RE | Disposition: A | Discharge status: Home / Self Care | Payer: Self-pay | Source: Home / Self Care | Attending: Orthopedic Surgery

## 2018-10-13 ENCOUNTER — Inpatient Hospital Stay (HOSPITAL_COMMUNITY): Payer: Medicare Other | Admitting: Anesthesiology

## 2018-10-13 ENCOUNTER — Inpatient Hospital Stay (HOSPITAL_COMMUNITY)
Admission: RE | Admit: 2018-10-13 | Discharge: 2018-10-14 | DRG: 483 | Disposition: A | Discharge status: Home / Self Care | Payer: Medicare Other | Attending: Orthopedic Surgery | Admitting: Orthopedic Surgery

## 2018-10-13 ENCOUNTER — Inpatient Hospital Stay (HOSPITAL_COMMUNITY): Payer: Medicare Other | Admitting: Physician Assistant

## 2018-10-13 DIAGNOSIS — Z8711 Personal history of peptic ulcer disease: Secondary | ICD-10-CM | POA: Diagnosis not present

## 2018-10-13 DIAGNOSIS — Z79890 Hormone replacement therapy (postmenopausal): Secondary | ICD-10-CM | POA: Diagnosis not present

## 2018-10-13 DIAGNOSIS — Z9841 Cataract extraction status, right eye: Secondary | ICD-10-CM

## 2018-10-13 DIAGNOSIS — M19011 Primary osteoarthritis, right shoulder: Secondary | ICD-10-CM | POA: Diagnosis present

## 2018-10-13 DIAGNOSIS — Z961 Presence of intraocular lens: Secondary | ICD-10-CM | POA: Diagnosis not present

## 2018-10-13 DIAGNOSIS — E785 Hyperlipidemia, unspecified: Secondary | ICD-10-CM | POA: Diagnosis not present

## 2018-10-13 DIAGNOSIS — Z79899 Other long term (current) drug therapy: Secondary | ICD-10-CM

## 2018-10-13 DIAGNOSIS — M25811 Other specified joint disorders, right shoulder: Secondary | ICD-10-CM | POA: Diagnosis not present

## 2018-10-13 DIAGNOSIS — K529 Noninfective gastroenteritis and colitis, unspecified: Secondary | ICD-10-CM | POA: Diagnosis not present

## 2018-10-13 DIAGNOSIS — E039 Hypothyroidism, unspecified: Secondary | ICD-10-CM | POA: Diagnosis not present

## 2018-10-13 DIAGNOSIS — K5730 Diverticulosis of large intestine without perforation or abscess without bleeding: Secondary | ICD-10-CM | POA: Diagnosis not present

## 2018-10-13 DIAGNOSIS — Z9842 Cataract extraction status, left eye: Secondary | ICD-10-CM | POA: Diagnosis not present

## 2018-10-13 DIAGNOSIS — M25511 Pain in right shoulder: Secondary | ICD-10-CM | POA: Diagnosis not present

## 2018-10-13 DIAGNOSIS — H409 Unspecified glaucoma: Secondary | ICD-10-CM | POA: Diagnosis not present

## 2018-10-13 DIAGNOSIS — M75101 Unspecified rotator cuff tear or rupture of right shoulder, not specified as traumatic: Principal | ICD-10-CM | POA: Diagnosis present

## 2018-10-13 DIAGNOSIS — Z96611 Presence of right artificial shoulder joint: Secondary | ICD-10-CM

## 2018-10-13 DIAGNOSIS — G8918 Other acute postprocedural pain: Secondary | ICD-10-CM | POA: Diagnosis not present

## 2018-10-13 DIAGNOSIS — I10 Essential (primary) hypertension: Secondary | ICD-10-CM | POA: Diagnosis present

## 2018-10-13 HISTORY — PX: REVERSE SHOULDER ARTHROPLASTY: SHX5054

## 2018-10-13 SURGERY — ARTHROPLASTY, SHOULDER, TOTAL, REVERSE
Anesthesia: General | Laterality: Right

## 2018-10-13 MED ORDER — LACTATED RINGERS IV SOLN
INTRAVENOUS | Status: DC
  Administered 2018-10-13: 15:00:00 via INTRAVENOUS

## 2018-10-13 MED ORDER — ALUM & MAG HYDROXIDE-SIMETH 200-200-20 MG/5ML PO SUSP: 30.0000 mL | ORAL | Status: DC | PRN

## 2018-10-13 MED ORDER — METHOCARBAMOL 500 MG PO TABS: 500.0000 mg | ORAL_TABLET | Freq: Four times a day (QID) | ORAL | Status: DC | PRN

## 2018-10-13 MED ORDER — DOCUSATE SODIUM 100 MG PO CAPS
100.0000 mg | ORAL_CAPSULE | Freq: Two times a day (BID) | ORAL | Status: DC
  Administered 2018-10-13 – 2018-10-14 (×2): 100 mg via ORAL
  Filled 2018-10-13 (×2): qty 1

## 2018-10-13 MED ORDER — ONDANSETRON HCL 4 MG/2ML IJ SOLN
INTRAMUSCULAR | Status: DC | PRN
  Administered 2018-10-13: 4 mg via INTRAVENOUS

## 2018-10-13 MED ORDER — BUPIVACAINE HCL (PF) 0.75 % IJ SOLN
INTRAMUSCULAR | Status: DC | PRN
  Administered 2018-10-13: 15 mL via PERINEURAL

## 2018-10-13 MED ORDER — ROCURONIUM BROMIDE 10 MG/ML (PF) SYRINGE
PREFILLED_SYRINGE | INTRAVENOUS | Status: DC | PRN
  Administered 2018-10-13: 5 mg via INTRAVENOUS
  Administered 2018-10-13: 60 mg via INTRAVENOUS

## 2018-10-13 MED ORDER — TRANEXAMIC ACID-NACL 1000-0.7 MG/100ML-% IV SOLN
1000.0000 mg | INTRAVENOUS | Status: AC
  Administered 2018-10-13: 10:00:00 1000 mg via INTRAVENOUS
  Filled 2018-10-13: qty 100

## 2018-10-13 MED ORDER — OXYCODONE HCL 5 MG PO TABS: 10.0000 mg | ORAL_TABLET | ORAL | Status: DC | PRN

## 2018-10-13 MED ORDER — BISACODYL 5 MG PO TBEC: 5.0000 mg | DELAYED_RELEASE_TABLET | Freq: Every day | ORAL | Status: DC | PRN

## 2018-10-13 MED ORDER — POLYETHYLENE GLYCOL 3350 17 G PO PACK: 17.0000 g | PACK | Freq: Every day | ORAL | Status: DC | PRN

## 2018-10-13 MED ORDER — PHENOL 1.4 % MT LIQD
1.0000 | OROMUCOSAL | Status: DC | PRN
  Filled 2018-10-13: qty 177

## 2018-10-13 MED ORDER — LACTATED RINGERS IV SOLN
INTRAVENOUS | Status: DC
  Administered 2018-10-13: 08:00:00 via INTRAVENOUS

## 2018-10-13 MED ORDER — EPHEDRINE SULFATE-NACL 50-0.9 MG/10ML-% IV SOSY
PREFILLED_SYRINGE | INTRAVENOUS | Status: DC | PRN
  Administered 2018-10-13 (×3): 10 mg via INTRAVENOUS

## 2018-10-13 MED ORDER — METOCLOPRAMIDE HCL 5 MG/ML IJ SOLN: 5.0000 mg | Freq: Three times a day (TID) | INTRAMUSCULAR | Status: DC | PRN

## 2018-10-13 MED ORDER — HYDROMORPHONE HCL 1 MG/ML IJ SOLN: 0.5000 mg | INTRAMUSCULAR | Status: DC | PRN

## 2018-10-13 MED ORDER — FENTANYL CITRATE (PF) 100 MCG/2ML IJ SOLN
INTRAMUSCULAR | Status: DC | PRN
  Administered 2018-10-13 (×2): 50 ug via INTRAVENOUS

## 2018-10-13 MED ORDER — METHOCARBAMOL 500 MG IVPB - SIMPLE MED
500.0000 mg | Freq: Four times a day (QID) | INTRAVENOUS | Status: DC | PRN
  Filled 2018-10-13: qty 50

## 2018-10-13 MED ORDER — DEXAMETHASONE SODIUM PHOSPHATE 10 MG/ML IJ SOLN
INTRAMUSCULAR | Status: DC | PRN
  Administered 2018-10-13: 8 mg via INTRAVENOUS

## 2018-10-13 MED ORDER — FENTANYL CITRATE (PF) 250 MCG/5ML IJ SOLN
INTRAMUSCULAR | Status: AC
  Filled 2018-10-13: qty 5

## 2018-10-13 MED ORDER — CEFAZOLIN SODIUM-DEXTROSE 2-4 GM/100ML-% IV SOLN
2.0000 g | INTRAVENOUS | Status: AC
  Administered 2018-10-13: 2 g via INTRAVENOUS
  Filled 2018-10-13: qty 100

## 2018-10-13 MED ORDER — PROPOFOL 10 MG/ML IV BOLUS
INTRAVENOUS | Status: DC | PRN
  Administered 2018-10-13: 100 mg via INTRAVENOUS

## 2018-10-13 MED ORDER — OXYCODONE HCL 5 MG PO TABS
5.0000 mg | ORAL_TABLET | ORAL | Status: DC | PRN
  Administered 2018-10-14: 5 mg via ORAL
  Filled 2018-10-13: qty 1

## 2018-10-13 MED ORDER — STERILE WATER FOR IRRIGATION IR SOLN
Status: DC | PRN
  Administered 2018-10-13: 2000 mL

## 2018-10-13 MED ORDER — CHLORHEXIDINE GLUCONATE 4 % EX LIQD: 60.0000 mL | Freq: Once | CUTANEOUS | Status: DC

## 2018-10-13 MED ORDER — SUGAMMADEX SODIUM 200 MG/2ML IV SOLN
INTRAVENOUS | Status: DC | PRN
  Administered 2018-10-13: 150 mg via INTRAVENOUS

## 2018-10-13 MED ORDER — ACETAMINOPHEN 325 MG PO TABS: 325.0000 mg | ORAL_TABLET | Freq: Four times a day (QID) | ORAL | Status: DC | PRN

## 2018-10-13 MED ORDER — SODIUM CHLORIDE 0.9 % IV SOLN
INTRAVENOUS | Status: DC | PRN
  Administered 2018-10-13: 20 ug/min via INTRAVENOUS

## 2018-10-13 MED ORDER — ONDANSETRON HCL 4 MG PO TABS: 4.0000 mg | ORAL_TABLET | Freq: Four times a day (QID) | ORAL | Status: DC | PRN

## 2018-10-13 MED ORDER — LOSARTAN POTASSIUM 50 MG PO TABS
100.0000 mg | ORAL_TABLET | Freq: Every day | ORAL | Status: DC
  Administered 2018-10-13: 100 mg via ORAL
  Filled 2018-10-13 (×2): qty 2

## 2018-10-13 MED ORDER — MENTHOL 3 MG MT LOZG: 1.0000 | LOZENGE | OROMUCOSAL | Status: DC | PRN

## 2018-10-13 MED ORDER — FENTANYL CITRATE (PF) 100 MCG/2ML IJ SOLN: 25.0000 ug | INTRAMUSCULAR | Status: DC | PRN

## 2018-10-13 MED ORDER — TRAMADOL HCL 50 MG PO TABS: 50.0000 mg | ORAL_TABLET | Freq: Four times a day (QID) | ORAL | Status: DC | PRN

## 2018-10-13 MED ORDER — FENTANYL CITRATE (PF) 100 MCG/2ML IJ SOLN
50.0000 ug | Freq: Once | INTRAMUSCULAR | Status: AC
  Administered 2018-10-13: 09:00:00 50 ug via INTRAVENOUS
  Filled 2018-10-13: qty 2

## 2018-10-13 MED ORDER — DIPHENHYDRAMINE HCL 12.5 MG/5ML PO ELIX: 12.5000 mg | ORAL_SOLUTION | ORAL | Status: DC | PRN

## 2018-10-13 MED ORDER — HYDROCHLOROTHIAZIDE 12.5 MG PO CAPS
12.5000 mg | ORAL_CAPSULE | Freq: Every day | ORAL | Status: DC
  Administered 2018-10-13: 12.5 mg via ORAL
  Filled 2018-10-13 (×2): qty 1

## 2018-10-13 MED ORDER — MAGNESIUM CITRATE PO SOLN: 1.0000 | Freq: Once | ORAL | Status: DC | PRN

## 2018-10-13 MED ORDER — MIDAZOLAM HCL 2 MG/2ML IJ SOLN
1.0000 mg | Freq: Once | INTRAMUSCULAR | Status: DC
  Filled 2018-10-13: qty 2

## 2018-10-13 MED ORDER — LOSARTAN POTASSIUM-HCTZ 100-12.5 MG PO TABS: 1.0000 | ORAL_TABLET | Freq: Every day | ORAL | Status: DC

## 2018-10-13 MED ORDER — LEVOTHYROXINE SODIUM 125 MCG PO TABS
125.0000 ug | ORAL_TABLET | Freq: Every day | ORAL | Status: DC
  Administered 2018-10-14: 125 ug via ORAL
  Filled 2018-10-13: qty 1

## 2018-10-13 MED ORDER — BUPIVACAINE LIPOSOME 1.3 % IJ SUSP
INTRAMUSCULAR | Status: DC | PRN
  Administered 2018-10-13: 10 mL via PERINEURAL

## 2018-10-13 MED ORDER — PROPOFOL 10 MG/ML IV BOLUS
INTRAVENOUS | Status: AC
  Filled 2018-10-13: qty 20

## 2018-10-13 MED ORDER — PANTOPRAZOLE SODIUM 40 MG PO TBEC
40.0000 mg | DELAYED_RELEASE_TABLET | Freq: Every day | ORAL | Status: DC
  Administered 2018-10-13 – 2018-10-14 (×2): 40 mg via ORAL
  Filled 2018-10-13 (×2): qty 1

## 2018-10-13 MED ORDER — ONDANSETRON HCL 4 MG/2ML IJ SOLN: 4.0000 mg | Freq: Four times a day (QID) | INTRAMUSCULAR | Status: DC | PRN

## 2018-10-13 MED ORDER — METOCLOPRAMIDE HCL 5 MG PO TABS: 5.0000 mg | ORAL_TABLET | Freq: Three times a day (TID) | ORAL | Status: DC | PRN

## 2018-10-13 MED ORDER — SODIUM CHLORIDE 0.9 % IR SOLN
Status: DC | PRN
  Administered 2018-10-13: 1000 mL

## 2018-10-13 SURGICAL SUPPLY — 60 items
BAG ZIPLOCK 12X15 (MISCELLANEOUS) ×3 IMPLANT
BLADE SAW SGTL 83.5X18.5 (BLADE) ×3 IMPLANT
CHLORAPREP W/TINT 26 (MISCELLANEOUS) ×3 IMPLANT
COOLER ICEMAN CLASSIC (MISCELLANEOUS) ×3 IMPLANT
COVER SURGICAL LIGHT HANDLE (MISCELLANEOUS) ×3 IMPLANT
COVER WAND RF STERILE (DRAPES) IMPLANT
CUP SUT UNIV REVERS 36+2 RT (Cup) ×3 IMPLANT
DERMABOND ADVANCED (GAUZE/BANDAGES/DRESSINGS) ×2
DERMABOND ADVANCED .7 DNX12 (GAUZE/BANDAGES/DRESSINGS) ×1 IMPLANT
DRAPE INCISE IOBAN 66X45 STRL (DRAPES) IMPLANT
DRAPE ORTHO SPLIT 77X108 STRL (DRAPES) ×4
DRAPE SURG 17X11 SM STRL (DRAPES) ×3 IMPLANT
DRAPE SURG ORHT 6 SPLT 77X108 (DRAPES) ×2 IMPLANT
DRAPE U-SHAPE 47X51 STRL (DRAPES) ×3 IMPLANT
DRSG AQUACEL AG ADV 3.5X 6 (GAUZE/BANDAGES/DRESSINGS) ×3 IMPLANT
DRSG AQUACEL AG ADV 3.5X10 (GAUZE/BANDAGES/DRESSINGS) ×3 IMPLANT
ELECT BLADE TIP CTD 4 INCH (ELECTRODE) ×3 IMPLANT
ELECT REM PT RETURN 15FT ADLT (MISCELLANEOUS) ×3 IMPLANT
FACESHIELD WRAPAROUND (MASK) ×9 IMPLANT
GLENOID UNI REV MOD 24 +2 LAT (Joint) ×3 IMPLANT
GLENOSPHERE 36 +4 LAT/24 (Joint) ×3 IMPLANT
GLOVE BIO SURGEON STRL SZ7.5 (GLOVE) ×3 IMPLANT
GLOVE BIO SURGEON STRL SZ8 (GLOVE) ×3 IMPLANT
GLOVE SS BIOGEL STRL SZ 7 (GLOVE) ×1 IMPLANT
GLOVE SS BIOGEL STRL SZ 7.5 (GLOVE) ×1 IMPLANT
GLOVE SUPERSENSE BIOGEL SZ 7 (GLOVE) ×2
GLOVE SUPERSENSE BIOGEL SZ 7.5 (GLOVE) ×2
GOWN STRL REUS W/TWL LRG LVL3 (GOWN DISPOSABLE) ×6 IMPLANT
INSERT HUMERAL 36 +6 (Shoulder) ×3 IMPLANT
KIT BASIN OR (CUSTOM PROCEDURE TRAY) ×3 IMPLANT
KIT TURNOVER KIT A (KITS) IMPLANT
MANIFOLD NEPTUNE II (INSTRUMENTS) ×3 IMPLANT
NEEDLE TAPERED W/ NITINOL LOOP (MISCELLANEOUS) IMPLANT
NS IRRIG 1000ML POUR BTL (IV SOLUTION) ×3 IMPLANT
PACK SHOULDER (CUSTOM PROCEDURE TRAY) ×3 IMPLANT
PAD ARMBOARD 7.5X6 YLW CONV (MISCELLANEOUS) ×6 IMPLANT
PAD COLD SHLDR WRAP-ON (PAD) IMPLANT
PIN SET MODULAR GLENOID SYSTEM (PIN) ×3 IMPLANT
PROTECTOR NERVE ULNAR (MISCELLANEOUS) ×3 IMPLANT
RESTRAINT HEAD UNIVERSAL NS (MISCELLANEOUS) ×3 IMPLANT
SCREW CENTRAL MOD 30MM (Screw) ×3 IMPLANT
SCREW PERI LOCK 5.5X16 (Screw) ×3 IMPLANT
SCREW PERI LOCK 5.5X24 (Screw) ×3 IMPLANT
SCREW PERIPHERAL 5.5X20 LOCK (Screw) ×3 IMPLANT
SCREW PERIPHERAL 5.5X28 LOCK (Screw) ×3 IMPLANT
SLING ARM FOAM STRAP LRG (SOFTGOODS) IMPLANT
SLING ARM FOAM STRAP MED (SOFTGOODS) ×3 IMPLANT
SLING ARM IMMOBILIZER MED (SOFTGOODS) ×3 IMPLANT
SPONGE LAP 18X18 RF (DISPOSABLE) IMPLANT
STEM HUMERAL UNI REVERS SZ9 (Stem) ×3 IMPLANT
SUCTION FRAZIER HANDLE 12FR (TUBING) ×2
SUCTION TUBE FRAZIER 12FR DISP (TUBING) ×1 IMPLANT
SUT MNCRL AB 3-0 PS2 18 (SUTURE) ×3 IMPLANT
SUT MON AB 2-0 CT1 36 (SUTURE) ×3 IMPLANT
SUT VIC AB 1 CT1 36 (SUTURE) ×3 IMPLANT
SUTURE TAPE 1.3 40 TPR END (SUTURE) ×1 IMPLANT
SUTURETAPE 1.3 40 TPR END (SUTURE) ×3
TOWEL OR 17X26 10 PK STRL BLUE (TOWEL DISPOSABLE) ×6 IMPLANT
TOWEL OR NON WOVEN STRL DISP B (DISPOSABLE) ×3 IMPLANT
WATER STERILE IRR 1000ML POUR (IV SOLUTION) ×6 IMPLANT

## 2018-10-13 NOTE — Anesthesia Procedure Notes (Signed)
Procedure Name: Intubation Performed by: Deepti Gunawan J, CRNA Pre-anesthesia Checklist: Patient identified, Emergency Drugs available, Suction available, Patient being monitored and Timeout performed Patient Re-evaluated:Patient Re-evaluated prior to induction Oxygen Delivery Method: Circle system utilized Preoxygenation: Pre-oxygenation with 100% oxygen Induction Type: IV induction Ventilation: Mask ventilation without difficulty Laryngoscope Size: Mac and 4 Grade View: Grade I Tube type: Oral Tube size: 7.0 mm Number of attempts: 1 Airway Equipment and Method: Stylet Placement Confirmation: ETT inserted through vocal cords under direct vision,  positive ETCO2 and breath sounds checked- equal and bilateral Secured at: 21 cm Tube secured with: Tape Dental Injury: Teeth and Oropharynx as per pre-operative assessment        

## 2018-10-13 NOTE — H&P (Signed)
Carol Wells    Chief Complaint: Right shoulder rotator cuff tear arthropathy HPI: The patient is a 85 y.o. female with end stage right shoulder rotator cuff tear arthropathy  Past Medical History:  Diagnosis Date  . Anemia 11/2015  . Arthritis   . Blood transfusion 2017 JULY 22  . Carotid stenosis    Dopplers 5/28: RICA 41-32%, LICA 4-40%  . Cataract    REMOVED  . Chronic headaches    HX MIGRAINES  . Collagenous colitis   . Diverticulosis 2013   Colonoscopy  . Gastric ulcer 11/2015  . Glaucoma    BOTH EYES  . History of spinal stenosis   . History of uterine fibroid   . Hx of cardiac catheterization    LHC 3/14: no angiographic CAD, EF 65%  . Hx of echocardiogram    Echocardiogram 06/21/12: Mild LVH, EF 10-27%, grade 2 diastolic dysfunction, mild MR  . Hyperlipidemia   . Hypertension   . Hypothyroidism   . Internal hemorrhoids 2009   Colonoscopy  . UTI (urinary tract infection) HX OF    Past Surgical History:  Procedure Laterality Date  . BUNIONECTOMY  YRS AGO LEFT   RIGHT [facility] 3 YRS AGO  . BUNIONECTOMY Left   . CATARACT EXTRACTION W/ INTRAOCULAR LENS IMPLANT  03/2010   Left  . CATARACT EXTRACTION W/ INTRAOCULAR LENS IMPLANT Bilateral 11/2010  . COLONOSCOPY  2013   NORMAL   . ESOPHAGOGASTRODUODENOSCOPY N/A 11/23/2015   Procedure: ESOPHAGOGASTRODUODENOSCOPY (EGD);  Surgeon: Rogene Houston, MD;  [facility];  Service: Endoscopy;  Laterality: N/A;  . EUS N/A 12/19/2015   Procedure: ESOPHAGEAL ENDOSCOPIC ULTRASOUND (EUS) RADIAL;  Surgeon: Milus Banister, MD;  Location: [facility];  Service: Endoscopy;  Laterality: N/A;  . FUNCTIONAL ENDOSCOPIC SINUS SURGERY    . HAND SURGERY Right 1998  . INGUINAL HERNIA REPAIR Left 03/11/2016   Procedure: LEFT INGUINAL HERINA REPAIR WITH MESH;  Surgeon: Donnie Mesa, MD;  Location: [place];  Service: General;  Laterality: Left;  . INSERTION OF MESH Left 03/11/2016   Procedure: INSERTION OF MESH;  Surgeon:  Donnie Mesa, MD;  Location: [place];  Service: General;  Laterality: Left;  . IR RADIOLOGIST EVAL & MGMT  07/01/2017  . LAMINECTOMY  03/2008  . LAPAROTOMY N/A 11/11/2017   Procedure: MINI LAPAROTOMY;  Surgeon: Everitt Amber, MD;  Location: [facility];  Service: Gynecology;  Laterality: N/A;  . POLYPECTOMY    . ROBOTIC ASSISTED TOTAL HYSTERECTOMY WITH BILATERAL SALPINGO OOPHERECTOMY Bilateral 11/11/2017   Procedure: XI ROBOTIC ASSISTED TOTAL HYSTERECTOMY WITH BILATERAL SALPINGO OOPHORECTOMY FOR UTERUS GREATER THAN 250 GRAMS;  Surgeon: Everitt Amber, MD;  Location: [facility];  Service: Gynecology;  Laterality: Bilateral;  . TOTAL HIP ARTHROPLASTY  2008   Right    Family History  Problem Relation Age of Onset  . Colon cancer Brother   . Colon polyps Brother     Social History:  reports that she has never smoked. She has never used smokeless tobacco. She reports current alcohol use. She reports that she does not use drugs.   Medications Prior to Admission  Medication Sig Dispense Refill  . atorvastatin (LIPITOR) 10 MG tablet Take 5 mg by mouth at bedtime.     . Calcium Carb-Cholecalciferol (CALCIUM 600+D) 600-800 MG-UNIT TABS Take 1 tablet by mouth daily with lunch.    . clobetasol ointment (TEMOVATE) 2.53 % Apply 1 application topically as needed (lichen sclerosus).     Marland Kitchen estradiol (ESTRACE)  0.5 MG tablet Take 0.5 mg by mouth at bedtime.     . ferrous sulfate 325 (65 FE) MG tablet Take 325 mg by mouth every other day.    . latanoprost (XALATAN) 0.005 % ophthalmic solution Place 1 drop into both eyes at bedtime.      Marland Kitchen levothyroxine (SYNTHROID, LEVOTHROID) 125 MCG tablet Take 125 mcg by mouth daily.    Marland Kitchen losartan-hydrochlorothiazide (HYZAAR) 100-12.5 MG tablet Take 1 tablet by mouth daily.    . Multiple Vitamin (MULTIVITAMIN) tablet Take 1 tablet by mouth daily with lunch.     . traMADol (ULTRAM) 50 MG tablet Take 1 tablet (50 mg total) by mouth every 6 (six) hours as needed for severe pain. (Patient  taking differently: Take 25-50 mg by mouth every 6 (six) hours as needed for severe pain. ) 20 tablet 0  . vitamin C (ASCORBIC ACID) 500 MG tablet Take 500 mg by mouth daily with lunch.     . budesonide (ENTOCORT EC) 3 MG 24 hr capsule TAKE 2 CAPSULES BY MOUTH ONCE DAILY (Patient not taking: Reported on 10/03/2018) 60 capsule 1  . pantoprazole (PROTONIX) 40 MG tablet Take 1 tablet (40 mg total) by mouth daily. (Patient not taking: Reported on 10/03/2018) 30 tablet 5     Physical Exam: Right shoulder noted to have painful and restricted mobility as noted at recent office visits.  Plain radiographs confirm loss of joint space and a high riding humeral head consistent with end stage rotator cuff tear arthropathy  Vitals  Temp:  [98.1 F (36.7 C)] 98.1 F (36.7 C) (06/11 0821) Pulse Rate:  [62-73] 66 (06/11 0911) Resp:  [8-21] 16 (06/11 0911) BP: (132-160)/(71-86) 143/86 (06/11 0909) SpO2:  [97 %-100 %] 100 % (06/11 0911) Weight:  [54.1 kg] 54.1 kg (06/11 0803)  Assessment/Plan  Impression: Right shoulder rotator cuff tear arthropathy  Plan of Action: Procedure(s): REVERSE SHOULDER ARTHROPLASTY  Belinda Schlichting M Celeste Candelas 10/13/2018, 9:28 AM Contact # [phone]

## 2018-10-13 NOTE — Op Note (Signed)
10/13/2018  11:30 AM  PATIENT:   Carol Wells  85 y.o. female  PRE-OPERATIVE DIAGNOSIS:  Right shoulder rotator cuff tear arthropathy  POST-OPERATIVE DIAGNOSIS: Same  PROCEDURE: Right shoulder reverse arthroplasty utilizing a size 9 Arthrex stem with a +6 polyethylene insert, 36/+4 glenosphere on a small/+2 baseplate.  SURGEON:  Marin Shutter M.D.  ASSISTANTS: Jenetta Loges, PA-C  ANESTHESIA:   General endotracheal and interscalene block with Exparel  EBL: 200 cc  SPECIMEN: None  Drains: None   PATIENT DISPOSITION:  PACU - hemodynamically stable.    PLAN OF CARE: Admit for overnight observation  Brief history:  Patient is an 85 year old female who has been followed for chronic and progressively increasing right shoulder pain and restricted mobility with increasing functional limitations related to end stage rotator cuff tear arthropathy.  She is brought to the operating this time for planned right shoulder reverse arthroplasty  Preoperatively and counseled Ms. Giovanni regarding treatment options and the potential risks versus benefits thereof.  Possible surgical complications were reviewed including bleeding, infection, neurovascular injury, persistence of pain, loss of motion, failure the implant, anesthetic complication, and possibly for additional surgery.  She understands and accepts and agrees with her planned procedure  Procedure in detail:  After undergoing routine preop evaluation patient received prophylactic antibiotics and an interscalene block with Exparel was established in the holding area by the anesthesia department.  Placed supine on the operating table and underwent the smooth induction of a general endotracheal anesthesia.  Placed into the beachchair position and appropriately padded and protected.  The right shoulder girdle region was sterilely prepped and draped in standard fashion.  Timeout was called.  An anterior deltopectoral approach to the right  shoulder is made through an 8 cm incision.  Skin flaps are elevated dissection carried deeply deltopectoral interval was then developed from proximal to distal with a vein taken laterally the upper centimeter the pectoralis major was tenotomized for exposure.  Conjoined tendon mobilized retracted medially.  The long head biceps tendon was then tenodesed at the upper border of the pectoralis major tendon and was then tenotomized and unroofed and excised proximally and the subscapularis was then dissected from the attachment to the lesser tuberosity and of note this was a extremely attenuated band of atrophic tissue and it was clear that the subscapularis did not have any functional elasticity.  We divided the capsular attachments from the anterior and infra margins of the humeral neck and the humeral head was then delivered through the wound.  Extra medullary guide was then used to outline the proposed humeral head resection which we performed with an oscillating saw with care taken to protect the remnants of the rotator cuff posteriorly.  A metal cap was then placed over the cut proximal humeral surface we then exposed the glenoid with appropriate retractors.  A circumferential labral resection was then performed gaining complete visualization of the periphery of the glenoid.  A guidepin was then introduced into the center of the glenoid with an approximate 10 degree inferior tilt and the glenoid was then reamed with our initial and then secondary reamers to a stable subchondral bony bed.  Our central drill hole was then tapped and our final implant with a 30 mm lag screw was assembled and introduced into the glenoid obtaining excellent purchase and fixation.  The peripheral locking screws were all then placed again obtaining excellent purchase and fixation.  A 36/+4 glenosphere was then impacted onto the baseplate and the central locking screw  was then inserted with terminal fixation much to our satisfaction.  We  then returned our attention back to the proximal humerus where we prepared the canal with hand reaming and then broached up to a size 9 and then used the posterior offset metaphyseal reamer to prepare the metaphysis and a subsequent trial reduction showed good soft tissue balance and good fit.  The trial was removed our final implant was then assembled on the back table and the implant was then impacted with excellent interference fit and fixation.  We then performed a series of trial reductions and felt that the +6 polyethylene insert gave [place] the best soft tissue balance with good stability and good motion.  The final polyethylene was then impacted into her implant a final reduction was then performed and again demonstrated good soft tissue balance good shoulder motion good stability.  Wounds then copiously irrigated.  At this point the deltopectoral interval was reapproximated with a series of figure-of-eight #1 Vicryl sutures.  2-0 Vicryl used for the subcu layer and intracuticular 3-0 Monocryl the skin followed by Dermabond.  An Aquasol dressing was then applied.  Right arm was placed in the sling and the patient was awakened, extubated, and taken to the recovery room in stable condition.  Jenetta Loges, PA-C was used as an Environmental consultant throughout this case essential for help with positioning of the patient, positioning of extremity, tissue manipulation, implantation of the prosthesis, wound closure, and intraoperative decision-making.  Metta Clines Lewanda Perea MD   Contact # [phone]

## 2018-10-13 NOTE — Transfer of Care (Signed)
Immediate Anesthesia Transfer of Care Note  Patient: [name]  Procedure(s) Performed: REVERSE SHOULDER ARTHROPLASTY (Right )  Patient Location: PACU  Anesthesia Type:General  Level of Consciousness: awake, alert  and oriented  Airway & Oxygen Therapy: Patient Spontanous Breathing and Patient connected to nasal cannula oxygen  Post-op Assessment: Report given to RN and Post -op Vital signs reviewed and stable  Post vital signs: Reviewed and stable  Last Vitals:  Vitals Value Taken Time  BP 147/70 10/13/18 1142  Temp    Pulse 79 10/13/18 1144  Resp 19 10/13/18 1144  SpO2 99 % 10/13/18 1144  Vitals shown include unvalidated device data.  Last Pain:  Vitals:   10/13/18 0821  TempSrc: Oral  PainSc:       Patients Stated Pain Goal: 4 (72/09/47 0962)  Complications: No apparent anesthesia complications

## 2018-10-13 NOTE — Anesthesia Preprocedure Evaluation (Addendum)
Anesthesia Evaluation  Patient identified by MRN, date of birth, ID band Patient awake    History of Anesthesia Complications Negative for: history of anesthetic complications  Airway Mallampati: II  TM Distance: >3 FB Neck ROM: Full    Dental  (+) Dental Advisory Given   Pulmonary  10/10/2018 novel coronavirus NEG   breath sounds clear to auscultation       Cardiovascular hypertension, Pt. on medications (-) angina(-) CAD  Rhythm:Regular Rate:Normal   '14 ECHO: Mild LVH, EF 74-94%, grade 2 diastolic dysfunction, mild MR   Neuro/Psych negative neurological ROS     GI/Hepatic Neg liver ROS, PUD, GERD  Medicated and Controlled,  Endo/Other  Hypothyroidism   Renal/GU negative Renal ROS     Musculoskeletal   Abdominal   Peds  Hematology negative hematology ROS (+)   Anesthesia Other Findings   Reproductive/Obstetrics                            Anesthesia Physical Anesthesia Plan  ASA: II  Anesthesia Plan: General   Post-op Pain Management: GA combined w/ Regional for post-op pain   Induction: Intravenous  PONV Risk Score and Plan: 3 and Ondansetron, Dexamethasone and Treatment may vary due to age or medical condition  Airway Management Planned: Oral ETT  Additional Equipment:   Intra-op Plan:   Post-operative Plan: Extubation in OR  Informed Consent: I have reviewed the patients History and Physical, chart, labs and discussed the procedure including the risks, benefits and alternatives for the proposed anesthesia with the patient or authorized representative who has indicated his/her understanding and acceptance.     Dental advisory given  Plan Discussed with: CRNA and Surgeon  Anesthesia Plan Comments: (Plan routine monitors, GETA with interscalene block for post op analgesia)       Anesthesia Quick Evaluation

## 2018-10-13 NOTE — Progress Notes (Signed)
AssistedDr. Carswell Jackson with right, ultrasound guided, supraclavicular block. Side rails up, monitors on throughout procedure. See vital signs in flow sheet. Tolerated Procedure well.  

## 2018-10-13 NOTE — Anesthesia Procedure Notes (Addendum)
Anesthesia Regional Block: Interscalene brachial plexus block   Pre-Anesthetic Checklist: ,, timeout performed, Correct Patient, Correct Site, Correct Laterality, Correct Procedure, Correct Position, site marked, Risks and benefits discussed,  Surgical consent,  Pre-op evaluation,  At surgeon's request and post-op pain management  Laterality: Right and Upper  Prep: chloraprep       Needles:  Injection technique: Single-shot  Needle Type: Echogenic Stimulator Needle     Needle Length: 9cm  Needle Gauge: 21     Additional Needles:   Procedures:, nerve stimulator,,, ultrasound used (permanent image in chart),,,,   Nerve Stimulator or Paresthesia:  Response: hand twitch, 0.4 mA, 0.1 ms,   Additional Responses:   Narrative:  Start time: 10/13/2018 9:01 AM End time: 10/13/2018 9:07 AM Injection made incrementally with aspirations every 5 mL.  Performed by: Personally  Anesthesiologist: Annye Asa, MD  Additional Notes: Pt identified in Holding room.  Monitors applied. Working IV access confirmed. Sterile prep, drape R neck.  #21ga PNS to hand twitch at 0.44mA threshold with US guidance.  15cc 0.5% Bupivacaine and Exparel injected incrementally after negative test dose.  Patient asymptomatic, VSS, no heme aspirated, tolerated well.  Jenita Seashore, MD

## 2018-10-13 NOTE — Discharge Instructions (Signed)
° °Kevin M. Supple, M.D., F.A.A.O.S. °Orthopaedic Surgery °Specializing in Arthroscopic and Reconstructive °Surgery of the Shoulder °[phone] °[address] - [place], [place] [address] - Fax [phone] ° ° °POST-OP TOTAL SHOULDER REPLACEMENT INSTRUCTIONS ° °1. Call the office at [phone] to schedule your first post-op appointment 10-14 days from the date of your surgery. ° °2. The bandage over your incision is waterproof. You may begin showering with this dressing on. You may leave this dressing on until first follow up appointment within 2 weeks. We prefer you leave this dressing in place until follow up however after 5-7 days if you are having itching or skin irritation and would like to remove it you may do so. Go slow and tug at the borders gently to break the bond the dressing has with the skin. At this point if there is no drainage it is okay to go without a bandage or you may cover it with a light guaze and tape. You can also expect significant bruising around your shoulder that will drift down your arm and into your chest wall. This is very normal and should resolve over several days. ° ° 3. Wear your sling/immobilizer at all times except to perform the exercises below or to occasionally let your arm dangle by your side to stretch your elbow. You also need to sleep in your sling immobilizer until instructed otherwise. It is ok to remove your sling if you are sitting in a controlled environment and allow your arm to rest in a position of comfort by your side or on your lap with pillows to give your neck and skin a break from the sling. You may remove it to allow arm to dangle by side to shower. If you are up walking around and when you go to sleep at night you need to wear it. ° °4. Range of motion to your elbow, wrist, and hand are encouraged 3-5 times daily. Exercise to your hand and fingers helps to reduce swelling you may experience. ° °5. Utilize ice to the shoulder 3-5 times  minimum a day and additionally if you are experiencing pain. ° °6. Prescriptions for a pain medication and a muscle relaxant are provided for you. It is recommended that if you are experiencing pain that you pain medication alone is not controlling, add the muscle relaxant along with the pain medication which can give additional pain relief. The first 1-2 days is generally the most severe of your pain and then should gradually decrease. As your pain lessens it is recommended that you decrease your use of the pain medications to an "as needed basis'" only and to always comply with the recommended dosages of the pain medications. ° °7. Pain medications can produce constipation along with their use. If you experience this, the use of an over the counter stool softener or laxative daily is recommended.  ° °8. For additional questions or concerns, please do not hesitate to call the office. If after hours there is an answering service to forward your concerns to the physician on call. ° °9.Pain control following an exparel block ° °To help control your post-operative pain you received a nerve block  performed with Exparel which is a long acting anesthetic (numbing agent) which can provide pain relief and sensations of numbness (and relief of pain) in the operative shoulder and arm for up to 3 days. Sometimes it provides mixed relief, meaning you may still have numbness in certain areas of the arm but can still   be able to move  parts of that arm, hand, and fingers. We recommend that your prescribed pain medications  be used as needed. We do not feel it is necessary to "pre medicate" and "stay ahead" of pain.  Taking narcotic pain medications when you are not having any pain can lead to unnecessary and potentially dangerous side effects.    POST-OP EXERCISES  Pendulum Exercises  Perform pendulum exercises while standing and bending at the waist. Support your uninvolved arm on a table or chair and allow your operated  arm to hang freely. Make sure to do these exercises passively - not using you shoulder muscles.  Repeat 20 times. Do 3 sessions per day.    Use the ice machine as much as possible in the first 5-7 days from surgery, then you can wean its use to as needed. The ice typically needs to be replaced every 6 hours, instead of ice you can actually freeze water bottles to put in the cooler and then fill water around them to avoid having to purchase ice. You can have spare water bottles freezing to allow you to rotate them once they have melted. Try to have a thin shirt or light cloth or towel under the ice wrap to protect your skin.

## 2018-10-13 NOTE — Anesthesia Postprocedure Evaluation (Signed)
Anesthesia Post Note  Patient: Carol Wells  Procedure(s) Performed: REVERSE SHOULDER ARTHROPLASTY (Right )     Patient location during evaluation: PACU Anesthesia Type: General and Regional Level of consciousness: awake and alert, patient cooperative and oriented Pain management: pain level controlled Vital Signs Assessment: post-procedure vital signs reviewed and stable Respiratory status: spontaneous breathing, nonlabored ventilation and respiratory function stable Cardiovascular status: blood pressure returned to baseline and stable Postop Assessment: no apparent nausea or vomiting Anesthetic complications: no    Last Vitals:  Vitals:   10/13/18 1245 10/13/18 1402  BP: (!) 163/88 (!) 143/75  Pulse: 65 63  Resp: 17 16  Temp:    SpO2: 96% 100%    Last Pain:  Vitals:   10/13/18 1245  TempSrc:   PainSc: 0-No pain                 Avelynn Sellin,E. Domingo Fuson

## 2018-10-14 MED ORDER — OXYCODONE-ACETAMINOPHEN 5-325 MG PO TABS: 1.0000 | ORAL_TABLET | ORAL | 0 refills | Status: DC | PRN

## 2018-10-14 MED ORDER — METHOCARBAMOL 500 MG PO TABS: 500.0000 mg | ORAL_TABLET | Freq: Three times a day (TID) | ORAL | 1 refills | Status: DC | PRN

## 2018-10-14 MED ORDER — TRAMADOL HCL 50 MG PO TABS: 25.0000 mg | ORAL_TABLET | Freq: Four times a day (QID) | ORAL | 0 refills | Status: DC | PRN

## 2018-10-14 MED ORDER — ONDANSETRON HCL 4 MG PO TABS: 4.0000 mg | ORAL_TABLET | Freq: Three times a day (TID) | ORAL | 0 refills | Status: DC | PRN

## 2018-10-14 NOTE — Discharge Summary (Signed)
PATIENT ID:      Carol Wells  MRN:     [id number] DOB/AGE:    06-01-1931 / 85 y.o.     DISCHARGE SUMMARY  ADMISSION DATE:    10/13/2018 DISCHARGE DATE:  10/14/2018  ADMISSION DIAGNOSIS: Right shoulder rotator cuff tear arthropathy Past Medical History:  Diagnosis Date  . Anemia 11/2015  . Arthritis   . Blood transfusion 2017 JULY 22  . Carotid stenosis    Dopplers 5/64: RICA 33-29%, LICA 5-18%  . Cataract    REMOVED  . Chronic headaches    HX MIGRAINES  . Collagenous colitis   . Diverticulosis 2013   Colonoscopy  . Gastric ulcer 11/2015  . Glaucoma    BOTH EYES  . History of spinal stenosis   . History of uterine fibroid   . Hx of cardiac catheterization    LHC 3/14: no angiographic CAD, EF 65%  . Hx of echocardiogram    Echocardiogram 06/21/12: Mild LVH, EF 84-16%, grade 2 diastolic dysfunction, mild MR  . Hyperlipidemia   . Hypertension   . Hypothyroidism   . Internal hemorrhoids 2009   Colonoscopy  . UTI (urinary tract infection) HX OF    DISCHARGE DIAGNOSIS:   Active Problems:   S/P reverse total shoulder arthroplasty, right   PROCEDURE: Procedure(s): REVERSE SHOULDER ARTHROPLASTY on 10/13/2018  CONSULTS:    HISTORY:  See H&P in chart.  HOSPITAL COURSE:  Carol Wells is a 85 y.o. admitted on 10/13/2018 with a diagnosis of Right shoulder rotator cuff tear arthropathy.  They were brought to the operating room on 10/13/2018 and underwent Procedure(s): [place].    They were given perioperative antibiotics:  Anti-infectives (From admission, onward)   Start     Dose/Rate Route Frequency Ordered Stop   10/13/18 0745  ceFAZolin (ANCEF) IVPB 2g/100 mL premix     2 g 200 mL/hr over 30 Minutes Intravenous On call to O.R. 10/13/18 6063 10/13/18 1054    .  Patient underwent the above named procedure and tolerated it well. The following day they were hemodynamically stable and pain was controlled on oral analgesics. They were neurovascularly  intact to the operative extremity. OT was ordered and worked with patient per protocol. They were medically and orthopaedically stable for discharge on 10/14/2018.    DIAGNOSTIC STUDIES:  RECENT RADIOGRAPHIC STUDIES :  No results found.  RECENT VITAL SIGNS:   Patient Vitals for the past 24 hrs:  BP Temp Temp src Pulse Resp SpO2  10/14/18 1013 114/66 98 F (36.7 C) Oral 77 16 100 %  10/14/18 0438 127/69 98.2 F (36.8 C) Oral 64 18 99 %  10/14/18 0204 128/67 97.7 F (36.5 C) Oral 69 20 100 %  10/13/18 2058 122/73 98 F (36.7 C) Oral 81 16 95 %  10/13/18 1818 130/70 (!) 97.4 F (36.3 C) - 80 16 98 %  10/13/18 1620 132/63 (!) 97.5 F (36.4 C) - 77 16 100 %  10/13/18 1514 134/76 - - 61 16 100 %  10/13/18 1402 (!) 143/75 - - 63 16 100 %  10/13/18 1245 (!) 163/88 - - 65 17 96 %  10/13/18 1230 (!) 146/87 (!) 97.4 F (36.3 C) - 68 15 95 %  .  RECENT EKG RESULTS:    Orders placed or performed during the hospital encounter of 11/10/17  . EKG 12 lead  . EKG 12 lead    DISCHARGE INSTRUCTIONS:    DISCHARGE MEDICATIONS:   Allergies as of  10/14/2018   No Known Allergies     Medication List    TAKE these medications   atorvastatin 10 MG tablet Commonly known as: LIPITOR Take 5 mg by mouth at bedtime.   budesonide 3 MG 24 hr capsule Commonly known as: ENTOCORT EC TAKE 2 CAPSULES BY MOUTH ONCE DAILY   Calcium 600+D 600-800 MG-UNIT Tabs Generic drug: Calcium Carb-Cholecalciferol Take 1 tablet by mouth daily with lunch.   clobetasol ointment 0.05 % Commonly known as: TEMOVATE Apply 1 application topically as needed (lichen sclerosus).   estradiol 0.5 MG tablet Commonly known as: ESTRACE Take 0.5 mg by mouth at bedtime.   ferrous sulfate 325 (65 FE) MG tablet Take 325 mg by mouth every other day.   latanoprost 0.005 % ophthalmic solution Commonly known as: XALATAN Place 1 drop into both eyes at bedtime.   levothyroxine 125 MCG tablet Commonly known as:  SYNTHROID Take 125 mcg by mouth daily.   losartan-hydrochlorothiazide 100-12.5 MG tablet Commonly known as: HYZAAR Take 1 tablet by mouth daily.   methocarbamol 500 MG tablet Commonly known as: Robaxin Take 1 tablet (500 mg total) by mouth 3 (three) times daily as needed for muscle spasms.   multivitamin tablet Take 1 tablet by mouth daily with lunch.   ondansetron 4 MG tablet Commonly known as: Zofran Take 1 tablet (4 mg total) by mouth every 8 (eight) hours as needed for nausea or vomiting.   oxyCODONE-acetaminophen 5-325 MG tablet Commonly known as: Percocet Take 1 tablet by mouth every 4 (four) hours as needed (max 6 q).   pantoprazole 40 MG tablet Commonly known as: PROTONIX Take 1 tablet (40 mg total) by mouth daily.   traMADol 50 MG tablet Commonly known as: ULTRAM Take 0.5-1 tablets (25-50 mg total) by mouth every 6 (six) hours as needed for severe pain.   vitamin C 500 MG tablet Commonly known as: ASCORBIC ACID Take 500 mg by mouth daily with lunch.       FOLLOW UP VISIT:   Follow-up Information    Justice Britain, MD.   Specialty: Orthopedic Surgery Why: call to be seen in 10-14 days Contact information: [address] [place] [address] [phone]           DISCHARGE ZY:YQMG   DISCHARGE CONDITION:  Thereasa Parkin Tersa Fotopoulos for Dr. Justice Britain 10/14/2018, 12:19 PM

## 2018-10-14 NOTE — Evaluation (Signed)
Occupational Therapy Evaluation Patient Details Name: Carol Wells MRN: [id number] DOB: 1931/09/13 Today's Date: 10/14/2018    History of Present Illness s/p R RTSA   Clinical Impression   Pt was admitted for the above sx. All education was completed. Pt will follow up with Dr Onnie Graham for further rehab     Follow Up Recommendations  Follow surgeon's recommendation for DC plan and follow-up therapies    Equipment Recommendations  None recommended by OT(pt doesn't feel she needs bathroom DME)    Recommendations for Other Services       Precautions / Restrictions Precautions Precautions: Shoulder Type of Shoulder Precautions: sling on except for bathing, dressing and exercises.  May have sling off in chair with arm supported by pillows behind and underneath.  May use arm for adls within the following parameters:  20 ER, 45 ABD, 60 FF.  May move elbow through fingers, lapslides and pendulums Restrictions Weight Bearing Restrictions: Yes Other Position/Activity Restrictions: NWB      Mobility Bed Mobility               General bed mobility comments: oob  Transfers Overall transfer level: Needs assistance               General transfer comment: min guard for safety    Balance Overall balance assessment: (a little unsteady when walking)                                         ADL either performed or assessed with clinical judgement   ADL Overall ADL's : Needs assistance/impaired Eating/Feeding: Set up   Grooming: Minimal assistance   Upper Body Bathing: Moderate assistance   Lower Body Bathing: Moderate assistance   Upper Body Dressing : Moderate assistance   Lower Body Dressing: Maximal assistance   Toilet Transfer: Minimal assistance   Toileting- Clothing Manipulation and Hygiene: Minimal assistance         General ADL Comments: performed ADL and used toilet. Min A for balance; pt unsteady. Recommended pt's husband use gait  belt for safety.  Reinforced NWB.  performed pendulums and demonstrated other exercises        Also recommended pt stand in shower with husband nearby for safety, sit on                                                                                   commode for washing legs/feet   Vision         Perception     Praxis      Pertinent Vitals/Pain Pain Assessment: No/denies pain(block in effect)     Hand Dominance Right   Extremity/Trunk Assessment Upper Extremity Assessment Upper Extremity Assessment: RUE deficits/detail RUE Deficits / Details: block in effect; able to move hand, thumb lags           Communication Communication Communication: No difficulties   Cognition Arousal/Alertness: Awake/alert Behavior During Therapy: WFL for tasks assessed/performed Overall Cognitive Status: Within Functional Limits for tasks assessed  General Comments       Exercises     Shoulder Instructions      Home Living Family/patient expects to be discharged to:: Private residence Living Arrangements: Spouse/significant other Available Help at Discharge: Family               Bathroom Shower/Tub: Occupational psychologist: Standard     Home Equipment: None          Prior Functioning/Environment Level of Independence: Independent                 OT Problem List:        OT Treatment/Interventions:      OT Goals(Current goals can be found in the care plan section) Acute Rehab OT Goals Patient Stated Goal: return to independence OT Goal Formulation: All assessment and education complete, DC therapy  OT Frequency:     Barriers to D/C:            Co-evaluation              AM-PAC OT "6 Clicks" Daily Activity     Outcome Measure Help from another person eating meals?: A Little Help from another person taking care of personal grooming?: A Little Help from another person toileting, which  includes using toliet, bedpan, or urinal?: A Little Help from another person bathing (including washing, rinsing, drying)?: A Lot Help from another person to put on and taking off regular upper body clothing?: A Lot Help from another person to put on and taking off regular lower body clothing?: A Lot 6 Click Score: 15   End of Session    Activity Tolerance: Patient tolerated treatment well Patient left: in chair;with call bell/phone within reach  OT Visit Diagnosis: Muscle weakness (generalized) (M62.81)                Time: 5374-8270 OT Time Calculation (min): 41 min Charges:  OT General Charges $OT Visit: 1 Visit OT Evaluation $OT Eval Low Complexity: 1 Low OT Treatments $Self Care/Home Management : 8-22 mins $Therapeutic Exercise: 8-22 mins  Lesle Chris, OTR/L Acute Rehabilitation Services [phone] [facility] pager [phone] office 10/14/2018  [place] 10/14/2018, 9:34 AM

## 2018-10-14 NOTE — Plan of Care (Signed)
  Problem: Education: Goal: Knowledge of General Education information will improve Description: Including pain rating scale, medication(s)/side effects and non-pharmacologic comfort measures Outcome: Adequate for Discharge   Problem: Health Behavior/Discharge Planning: Goal: Ability to manage health-related needs will improve Outcome: Adequate for Discharge   Problem: Clinical Measurements: Goal: Ability to maintain clinical measurements within normal limits will improve Outcome: Adequate for Discharge Goal: Will remain free from infection Outcome: Adequate for Discharge Goal: Cardiovascular complication will be avoided Outcome: Adequate for Discharge   Problem: Activity: Goal: Risk for activity intolerance will decrease Outcome: Adequate for Discharge   Problem: Nutrition: Goal: Adequate nutrition will be maintained Outcome: Adequate for Discharge   Problem: Coping: Goal: Level of anxiety will decrease Outcome: Adequate for Discharge   Problem: Elimination: Goal: Will not experience complications related to bowel motility Outcome: Adequate for Discharge Goal: Will not experience complications related to urinary retention Outcome: Adequate for Discharge   Problem: Pain Managment: Goal: General experience of comfort will improve Outcome: Adequate for Discharge   Problem: Safety: Goal: Ability to remain free from injury will improve Outcome: Adequate for Discharge   Problem: Skin Integrity: Goal: Risk for impaired skin integrity will decrease Outcome: Adequate for Discharge   

## 2018-10-17 ENCOUNTER — Encounter (HOSPITAL_COMMUNITY): Payer: Self-pay | Admitting: Orthopedic Surgery

## 2018-10-24 DIAGNOSIS — Z96611 Presence of right artificial shoulder joint: Secondary | ICD-10-CM | POA: Diagnosis not present

## 2018-10-24 DIAGNOSIS — Z471 Aftercare following joint replacement surgery: Secondary | ICD-10-CM | POA: Diagnosis not present

## 2018-10-25 DIAGNOSIS — Z1329 Encounter for screening for other suspected endocrine disorder: Secondary | ICD-10-CM | POA: Diagnosis not present

## 2018-10-25 DIAGNOSIS — R7301 Impaired fasting glucose: Secondary | ICD-10-CM | POA: Diagnosis not present

## 2018-10-25 DIAGNOSIS — E039 Hypothyroidism, unspecified: Secondary | ICD-10-CM | POA: Diagnosis not present

## 2018-10-25 DIAGNOSIS — I10 Essential (primary) hypertension: Secondary | ICD-10-CM | POA: Diagnosis not present

## 2018-11-01 ENCOUNTER — Encounter (HOSPITAL_COMMUNITY): Payer: Self-pay

## 2018-11-01 ENCOUNTER — Other Ambulatory Visit: Payer: Self-pay

## 2018-11-01 ENCOUNTER — Ambulatory Visit (HOSPITAL_COMMUNITY): Payer: Medicare Other | Attending: Orthopedic Surgery

## 2018-11-01 DIAGNOSIS — M25611 Stiffness of right shoulder, not elsewhere classified: Secondary | ICD-10-CM | POA: Diagnosis not present

## 2018-11-01 DIAGNOSIS — R29898 Other symptoms and signs involving the musculoskeletal system: Secondary | ICD-10-CM | POA: Insufficient documentation

## 2018-11-01 DIAGNOSIS — M25511 Pain in right shoulder: Secondary | ICD-10-CM | POA: Diagnosis not present

## 2018-11-01 NOTE — Patient Instructions (Signed)
*  Make sure you do not left your elbow go behind you. *   TOWEL SLIDES COMPLETE FOR 1-3 MINUTES, 3-5 TIMES PER DAY  SHOULDER: Flexion On Table   Place hands on table, elbows straight. Move hips away from body. Press hands down into table. Hold ___ seconds. ___ reps per set, ___ sets per day, ___ days per week  Abduction (Passive)   With arm out to side, resting on table, lower head toward arm, keeping trunk away from table. Hold ____ seconds. Repeat ____ times. Do ____ sessions per day.  Copyright  VHI. All rights reserved.     Internal Rotation (Assistive)   Seated with elbow bent at right angle and held against side, slide arm on table surface in an inward arc. Repeat ____ times. Do ____ sessions per day. Activity: Use this motion to brush crumbs off the table.  Copyright  VHI. All rights reserved.    COMPLETE PENDULUM EXERCISES FOR 30 SECONDS TO A MINUTE EACH, 3-5 TIMES PER DAY. ROM: Pendulum (Side-to-Side)     Copyright  [employer]HI. All rights reserved.  Pendulum Forward/Back   Bend forward 90 at waist, using table for support. Rock body forward and back to swing arm. Repeat ____ times. Do ____ sessions per day.    Copyright  VHI. All rights reserved.  AROM: Wrist Extension   With right palm down, bend wrist up. Repeat 10____ times per set. Do ____ sets per session. Do __3__ sessions per day.  Copyright  VHI. All rights reserved.   AROM: Wrist Flexion   With right palm up, bend wrist up. Repeat ___10_ times per set. Do ____ sets per session. Do __3__ sessions per day.  Copyright  VHI. All rights reserved.   AROM: Forearm Pronation / Supination   With right arm in handshake position, slowly rotate palm down until stretch is felt. Relax. Then rotate palm up until stretch is felt. Repeat __10__ times per set. Do ____ sets per session. Do __3__ sessions per day.  Copyright  VHI. All rights reserved.   AFlexion (Passive)  or   Use other hand to  bend elbow, with thumb toward same shoulder. Do NOT force this motion. Hold ____ seconds. Repeat ____ times. Do ____ sessions per day.

## 2018-11-02 NOTE — Therapy (Signed)
[facility] [address] [place], [place], [address] Phone: [phone]   Fax:  [phone]  Occupational Therapy Evaluation  Patient Details  Name: Carol Wells MRN: [id number] Date of Birth: 06-27-1931 Referring Provider (OT): Justice Britain, MD   Encounter Date: 11/01/2018  OT End of Session - 11/01/18 1309    Visit Number  1    Number of Visits  16    Date for OT Re-Evaluation  12/27/18    Authorization Type  1) Medicare 80/20% 2) BC[employer] $10 copay with 30 visit limit. 0 used.    Authorization Time Period  see above    Authorization - Visit Number  1    Authorization - Number of Visits  30    OT Start Time  9622    OT Stop Time  1020    OT Time Calculation (min)  45 min    Activity Tolerance  Patient tolerated treatment well    Behavior During Therapy  WFL for tasks assessed/performed       Past Medical History:  Diagnosis Date  . Anemia 11/2015  . Arthritis   . Blood transfusion 2017 JULY 22  . Carotid stenosis    Dopplers 2/97: RICA 98-92%, LICA 1-19%  . Cataract    REMOVED  . Chronic headaches    HX MIGRAINES  . Collagenous colitis   . Diverticulosis 2013   Colonoscopy  . Gastric ulcer 11/2015  . Glaucoma    BOTH EYES  . History of spinal stenosis   . History of uterine fibroid   . Hx of cardiac catheterization    LHC 3/14: no angiographic CAD, EF 65%  . Hx of echocardiogram    Echocardiogram 06/21/12: Mild LVH, EF 41-74%, grade 2 diastolic dysfunction, mild MR  . Hyperlipidemia   . Hypertension   . Hypothyroidism   . Internal hemorrhoids 2009   Colonoscopy  . UTI (urinary tract infection) HX OF    Past Surgical History:  Procedure Laterality Date  . BUNIONECTOMY  YRS AGO LEFT   RIGHT [facility] AT [facility] 3 YRS AGO  . BUNIONECTOMY Left   . CATARACT EXTRACTION W/ INTRAOCULAR LENS IMPLANT  03/2010   Left  . CATARACT EXTRACTION W/ INTRAOCULAR LENS IMPLANT Bilateral 11/2010  . COLONOSCOPY  2013   NORMAL   .  ESOPHAGOGASTRODUODENOSCOPY N/A 11/23/2015   Procedure: ESOPHAGOGASTRODUODENOSCOPY (EGD);  Surgeon: Rogene Houston, MD;  [facility];  Service: Endoscopy;  Laterality: N/A;  . EUS N/A 12/19/2015   Procedure: ESOPHAGEAL ENDOSCOPIC ULTRASOUND (EUS) RADIAL;  Surgeon: Milus Banister, MD;  [facility];  Service: Endoscopy;  Laterality: N/A;  . FUNCTIONAL ENDOSCOPIC SINUS SURGERY    . HAND SURGERY Right 1998  . INGUINAL HERNIA REPAIR Left 03/11/2016   Procedure: LEFT INGUINAL HERINA REPAIR WITH MESH;  Surgeon: Donnie Mesa, MD;  Location: [facility];  Service: General;  Laterality: Left;  . INSERTION OF MESH Left 03/11/2016   Procedure: INSERTION OF MESH;  Surgeon: Donnie Mesa, MD;  Location: [facility];  Service: General;  Laterality: Left;  . IR RADIOLOGIST EVAL & MGMT  07/01/2017  . LAMINECTOMY  03/2008  . LAPAROTOMY N/A 11/11/2017   Procedure: MINI LAPAROTOMY;  Surgeon: Everitt Amber, MD;  [facility];  Service: Gynecology;  Laterality: N/A;  . POLYPECTOMY    . REVERSE SHOULDER ARTHROPLASTY Right 10/13/2018   Procedure: REVERSE SHOULDER ARTHROPLASTY;  Surgeon: Justice Britain, MD;  [facility];  Service: Orthopedics;  Laterality: Right;  110min  . ROBOTIC ASSISTED TOTAL HYSTERECTOMY WITH BILATERAL SALPINGO OOPHERECTOMY Bilateral 11/11/2017   Procedure: XI ROBOTIC ASSISTED TOTAL HYSTERECTOMY WITH BILATERAL SALPINGO OOPHORECTOMY FOR UTERUS GREATER THAN 250 GRAMS;  Surgeon: Everitt Amber, MD;  [facility];  Service: Gynecology;  Laterality: Bilateral;  . TOTAL HIP ARTHROPLASTY  2008   Right    There were no vitals filed for this visit.  Subjective Assessment - 11/01/18 0943    Subjective   S: My shoulder just wore out.    Pertinent History  Patient is a 85 y/o female S/PP right shoulder total reverse arthroplasty which was completed on 10/13/18. Patient presents in a sling and brings her protocol with her. Dr. Onnie Graham has referred patient to occupational therapy for evaluation  and treatment.    Special Tests  Complete FOTO next session.    Patient Stated Goals  To be able to garden and use her arm normally.    Currently in Pain?  Yes    Pain Score  4     Pain Location  Arm    Pain Orientation  Right    Pain Descriptors / Indicators  Contraction;Aching    Pain Type  Surgical pain    Pain Radiating Towards  N/A    Pain Onset  1 to 4 weeks ago    Pain Frequency  Constant    Aggravating Factors   being out of the sling    Pain Relieving Factors  pain medication- doesn't take it unless she needs to, relaxation techniques, ice machine    Effect of Pain on Daily Activities  Pt is unable to utilize her right UE at this time.        [facility] OT Assessment - 11/01/18 0948      Assessment   Medical Diagnosis  Right shoulder reverse total arthroplasty    Referring Provider (OT)  Justice Britain, MD    Onset Date/Surgical Date  10/13/18    Hand Dominance  Right    Next MD Visit  11/23/18    Prior Therapy  None for this condition      Precautions   Precautions  Shoulder    Type of Shoulder Precautions  See protocol. No behind the back movement or extension of shoulder for 6 weeks.     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       Mobility   Mobility Status  Independent      Written Expression   Dominant Hand  Right      Vision - History   Baseline Vision  Wears glasses all the time      Cognition   Overall Cognitive  Status  Within Functional Limits for tasks assessed      Observation/Other Assessments  Focus on Therapeutic Outcomes (FOTO)   Complete next session      Posture/Postural Control   Posture/Postural Control  Postural limitations    Postural Limitations  Rounded Shoulders;Forward head      ROM / Strength   AROM / PROM / Strength  AROM;PROM;Strength      Palpation   Palpation comment  Max fascial restrictions in right upper arm, bicep, deltoid, upper trapezius, and scapularis region.      AROM   Overall AROM   Unable to assess;Due to precautions      PROM   Overall PROM Comments  Assessed supine. IR/er adducted   Following protcol for ROM restrictions.    PROM Assessment Site  Shoulder    Right/Left Shoulder  Right    Right Shoulder Flexion  50 Degrees    Right Shoulder ABduction  60 Degrees    Right Shoulder Internal Rotation  90 Degrees    Right Shoulder External Rotation  15 Degrees      Strength   Overall Strength  Unable to assess;Due to precautions                      OT Education - 11/01/18 1307    Education Details  Education on keeping sling on at all times except to bathe, dress, and to complete exercises. HEP: table slides (within ROM limitations), A/ROM wrist and elbow, pendulums. Education on relaxation and not trying to push healing any faster than her body allows. Discussed length of treatment times and plan of care with duration of therapy.    Person(s) Educated  Patient    Methods  Explanation;Demonstration;Verbal cues;Handout    Comprehension  Need further instruction;Verbalized understanding       OT Short Term Goals - 11/02/18 0908      OT SHORT TERM GOAL #1   Title  Patient will be educated and independent with HEP to faciliate progress in therapy and allow her to return to using her RUE as her dominant extremity.    Time  4    Period  Weeks    Status  New    Target Date  11/29/18      OT SHORT TERM GOAL #2   Title  Patient will  increase RUE P/ROM to [facility] in order to increase ability to complete UB dressing tasks with less difficulty.    Time  4    Period  Weeks    Status  New      OT SHORT TERM GOAL #3   Title  Patient will increase RUE strength to 3/5 in order to complete waist level activities with more strength and stability.    Time  4    Period  Weeks    Status  New      OT SHORT TERM GOAL #4   Title  Patient will decrease fascial restrictions in her RUE to mod amount or less in order to increase functional mobility needed to complete reaching tasks.    Time  4    Period  Weeks    Status  New      OT SHORT TERM GOAL #5   Title  Patient will decrease pain level to approximately 5/10 during functional daily tasks.    Time  4    Period  Weeks    Status  New        OT Long Term Goals - 11/02/18 0911      OT LONG TERM GOAL #1  Title  Patient will reach her highest level of independence while using her RUE as her dominant extremity for 75% or more tasks.    Time  8    Period  Weeks    Status  New    Target Date  12/27/18      OT LONG TERM GOAL #2   Title  Patient will increase her A/ROM of her RUE to [facility] in order to complete overhead reaching tasks with increased comfort and ease.    Time  8    Period  Weeks    Status  New      OT LONG TERM GOAL #3   Title  Patient will increase RUE strength to 4/5 in order to complete normal household lifting tasks while using her RUE.    Time  8    Period  Weeks    Status  New      OT LONG TERM GOAL #4   Title  Patient will decrease her RUE fascial restrictions to min amount or less in order to increase her functional mobility needed when reaching into the overhead cabinets.    Time  8    Period  Weeks    Status  New      OT LONG TERM GOAL #5   Title  Pt will report a decrease in pain of approximately 2/10 or less when completing daily tasks with her RUE.    Time  8    Period  Weeks    Status  New            Plan - 11/01/18 1310     Clinical Impression Statement  A: patient is a 85 y/o female S/P right shoulder reverse total arthroplasty causing increased pain, fascial restrictions, and decreased ROM and strength resulting in in ability to use her RUE for her daily tasks as her dominant extremity.    OT Occupational Profile and History  Detailed Assessment- Review of Records and additional review of physical, cognitive, psychosocial history related to current functional performance    Occupational performance deficits (Please refer to evaluation for details):  ADL's;IADL's;Leisure    Body Structure / Function / Physical Skills  ADL;ROM;Scar mobility;IADL;Edema;Fascial restriction;Mobility;Strength;Coordination;Decreased knowledge of use of DME;Decreased knowledge of precautions;UE functional use;Pain    Rehab Potential  Excellent    Clinical Decision Making  Several treatment options, min-mod task modification necessary    Comorbidities Affecting Occupational Performance:  May have comorbidities impacting occupational performance    Modification or Assistance to Complete Evaluation   Min-Moderate modification of tasks or assist with assess necessary to complete eval    OT Frequency  2x / week    OT Duration  8 weeks    OT Treatment/Interventions  Self-care/ADL training;Therapeutic exercise;Manual Therapy;Neuromuscular education;Ultrasound;Therapeutic activities;DME and/or AE instruction;Cryotherapy;Electrical Stimulation;Scar mobilization;Patient/family education;Passive range of motion;Moist Heat    Plan  P: Patient will benefit form skilled OT services to increase functional performance while completing her daily tasks using her RUE. Treatment Plan: Follow protocol. Myofascial release, manual stretching, P/ROM , AA/ROM, A/ROM, general strengthening of shoulder and scapular region. Modalities PRN.    Consulted and Agree with Plan of Care  Patient       Patient will benefit from skilled therapeutic intervention in order to  improve the following deficits and impairments:   Body Structure / Function / Physical Skills: ADL, ROM, Scar mobility, IADL, Edema, Fascial restriction, Mobility, Strength, Coordination, Decreased knowledge of use of DME, Decreased knowledge of precautions, UE functional use,  Pain       Visit Diagnosis: 1. Other symptoms and signs involving the musculoskeletal system   2. Acute pain of right shoulder   3. Stiffness of right shoulder, not elsewhere classified       Problem List Patient Active Problem List   Diagnosis Date Noted  . S/P reverse total shoulder arthroplasty, right 10/13/2018  . Fibroid uterus 11/11/2017  . Fibroids 11/03/2017  . Acute blood loss anemia 11/22/2015  . Colitis 11/22/2015  . UGI bleed   . Melena   . History of Helicobacter pylori infection   . Other fatigue   . Weakness   . Collagenous colitis 07/16/2011  . Chronic diarrhea 06/09/2011  . History of peptic ulcer disease 05/08/2011  . Personal history of colonic polyps 05/08/2011  . Family history of malignant neoplasm of gastrointestinal tract 05/08/2011  . Other general symptoms  05/08/2011  . Iron deficiency anemia, unspecified  05/08/2011  . Diarrhea 05/08/2011  . Diverticulosis of colon (without mention of hemorrhage) 05/08/2011   Ailene Ravel, OTR/L,CBIS  [phone]  11/02/2018, 9:15 AM  [facility] [address] [place], [place], [address] Phone: [phone]   Fax:  [phone]  Name: BIVIANA SADDLER MRN: [id number] Date of Birth: 1931-12-24

## 2018-11-03 ENCOUNTER — Encounter (HOSPITAL_COMMUNITY): Payer: Self-pay

## 2018-11-03 ENCOUNTER — Ambulatory Visit (HOSPITAL_COMMUNITY): Payer: Medicare Other | Attending: Orthopedic Surgery

## 2018-11-03 ENCOUNTER — Other Ambulatory Visit: Payer: Self-pay

## 2018-11-03 DIAGNOSIS — M25611 Stiffness of right shoulder, not elsewhere classified: Secondary | ICD-10-CM

## 2018-11-03 DIAGNOSIS — R29898 Other symptoms and signs involving the musculoskeletal system: Secondary | ICD-10-CM | POA: Diagnosis not present

## 2018-11-03 DIAGNOSIS — M25511 Pain in right shoulder: Secondary | ICD-10-CM

## 2018-11-03 NOTE — Therapy (Signed)
[facility] [place], [place], [address] Phone: [phone]   Fax:  [phone]  Occupational Therapy Treatment  Patient Details  Name: Carol Wells MRN: [id number] Date of Birth: [birth year] Referring Provider (OT): Justice Britain, MD   Encounter Date: 11/03/2018  OT End of Session - 11/03/18 1013    Visit Number  2    Number of Visits  16    Date for OT Re-Evaluation  12/27/18    Authorization Type  1) Medicare 80/20% 2) BC[employer] $10 copay with 30 visit limit. 0 used.    Authorization Time Period  see above    Authorization - Visit Number  2    Authorization - Number of Visits  30    OT Start Time  [phone]    OT Stop Time  1015    OT Time Calculation (min)  41 min    Activity Tolerance  Patient tolerated treatment well    Behavior During Therapy  WFL for tasks assessed/performed       Past Medical History:  Diagnosis Date  . Anemia 11/2015  . Arthritis   . Blood transfusion 2017 JULY 22  . Carotid stenosis    Dopplers 0/93: RICA 81-82%, LICA 9-93%  . Cataract    REMOVED  . Chronic headaches    HX MIGRAINES  . Collagenous colitis   . Diverticulosis 2013   Colonoscopy  . Gastric ulcer 11/2015  . Glaucoma    BOTH EYES  . History of spinal stenosis   . History of uterine fibroid   . Hx of cardiac catheterization    LHC 3/14: no angiographic CAD, EF 65%  . Hx of echocardiogram    Echocardiogram 06/21/12: Mild LVH, EF 71-69%, grade 2 diastolic dysfunction, mild MR  . Hyperlipidemia   . Hypertension   . Hypothyroidism   . Internal hemorrhoids 2009   Colonoscopy  . UTI (urinary tract infection) HX OF    Past Surgical History:  Procedure Laterality Date  . BUNIONECTOMY  YRS AGO LEFT   RIGHT [facility] AT [facility] 3 YRS AGO  . BUNIONECTOMY Left   . CATARACT EXTRACTION W/ INTRAOCULAR LENS IMPLANT  03/2010   Left  . CATARACT EXTRACTION W/ INTRAOCULAR LENS IMPLANT Bilateral 11/2010  . COLONOSCOPY  2013   NORMAL   .  ESOPHAGOGASTRODUODENOSCOPY N/A 11/23/2015   Procedure: ESOPHAGOGASTRODUODENOSCOPY (EGD);  Surgeon: Rogene Houston, MD;  [facility];  Service: Endoscopy;  Laterality: N/A;  . EUS N/A 12/19/2015   Procedure: ESOPHAGEAL ENDOSCOPIC ULTRASOUND (EUS) RADIAL;  Surgeon: Milus Banister, MD;  [facility];  Service: Endoscopy;  Laterality: N/A;  . FUNCTIONAL ENDOSCOPIC SINUS SURGERY    . HAND SURGERY Right 1998  . INGUINAL HERNIA REPAIR Left 03/11/2016   Procedure: LEFT INGUINAL HERINA REPAIR WITH MESH;  Surgeon: Donnie Mesa, MD;  Location: [facility];  Service: General;  Laterality: Left;  . INSERTION OF MESH Left 03/11/2016   Procedure: INSERTION OF MESH;  Surgeon: Donnie Mesa, MD;  Location: [facility];  Service: General;  Laterality: Left;  . IR RADIOLOGIST EVAL & MGMT  07/01/2017  . LAMINECTOMY  03/2008  . LAPAROTOMY N/A 11/11/2017   Procedure: MINI LAPAROTOMY;  Surgeon: Everitt Amber, MD;  [facility];  Service: Gynecology;  Laterality: N/A;  . POLYPECTOMY    . REVERSE SHOULDER ARTHROPLASTY Right 10/13/2018   Procedure: REVERSE SHOULDER ARTHROPLASTY;  Surgeon: Justice Britain, MD;  [facility];  Service: Orthopedics;  Laterality: Right;  134min  . ROBOTIC ASSISTED TOTAL HYSTERECTOMY WITH BILATERAL SALPINGO OOPHERECTOMY Bilateral 11/11/2017   Procedure: XI ROBOTIC ASSISTED TOTAL HYSTERECTOMY WITH BILATERAL SALPINGO OOPHORECTOMY FOR UTERUS GREATER THAN 250 GRAMS;  Surgeon: Everitt Amber, MD;  [facility];  Service: Gynecology;  Laterality: Bilateral;  . TOTAL HIP ARTHROPLASTY  2008   Right    There were no vitals filed for this visit.  Subjective Assessment - 11/03/18 1000    Subjective   S: I've been working on the table exercises but going out to the side is hard.    Special Tests  FOTO: 13/100    Currently in Pain?  Yes    Pain Score  3     Pain Location  Shoulder    Pain Orientation  Right    Pain Descriptors / Indicators  Aching;Constant    Pain Type  Surgical pain          [facility] OT Assessment - 11/03/18 1001      Assessment   Medical Diagnosis  Right shoulder reverse total arthroplasty      Precautions   Precautions  Shoulder    Type of Shoulder Precautions  See protocol. No behind the back movement or extension of shoulder for 6 weeks.     Shoulder Interventions  Shoulder sling/immobilizer      Observation/Other Assessments   Focus on Therapeutic Outcomes (FOTO)   13/100               OT Treatments/Exercises (OP) - 11/03/18 1001      Exercises   Exercises  Shoulder      Shoulder Exercises: Supine   Protraction  PROM;10 reps    External Rotation  PROM;10 reps    Internal Rotation  PROM;10 reps    Flexion  PROM;10 reps    ABduction  PROM;10 reps      Shoulder Exercises: Seated   Other Seated Exercises  Scapular depression; 10X; A/ROM    Other Seated Exercises  elbow flexion/extension, supination/protraction; 10X, A/ROM      Shoulder Exercises: Therapy Ball   Flexion  10 reps    ABduction  10 reps      Shoulder Exercises: Isometric Strengthening   External Rotation  Supine;3X5"    ABduction  Supine;3X5"      Manual Therapy   Manual Therapy  Myofascial release    Manual therapy comments  Manual therapy completed prior to exercises    Myofascial Release  Myofascial release and manual stretching completed to right upper arm, trapezius, and scapularis region to decrease fascial restrictions and increase joint mobility in a pain free zone.              OT Education - 11/03/18 1013    Education Details  reviewed therapy goals.    Person(s) Educated  Patient    Methods  Explanation    Comprehension  Verbalized understanding       OT Short Term Goals - 11/03/18 1007      OT SHORT TERM GOAL #1   Title  Patient will be educated and independent with HEP to faciliate progress in therapy and allow her to return to using her RUE as her dominant extremity.    Time  4    Period  Weeks    Status  On-going    Target  Date  11/29/18      OT SHORT TERM GOAL #2   Title  Patient will increase RUE P/ROM to [facility] in order to increase ability  to complete UB dressing tasks with less difficulty.    Time  4    Period  Weeks    Status  On-going      OT SHORT TERM GOAL #3   Title  Patient will increase RUE strength to 3/5 in order to complete waist level activities with more strength and stability.    Time  4    Period  Weeks    Status  On-going      OT SHORT TERM GOAL #4   Title  Patient will decrease fascial restrictions in her RUE to mod amount or less in order to increase functional mobility needed to complete reaching tasks.    Time  4    Period  Weeks    Status  On-going      OT SHORT TERM GOAL #5   Title  Patient will decrease pain level to approximately 5/10 during functional daily tasks.    Time  4    Period  Weeks    Status  On-going        OT Long Term Goals - 11/03/18 1007      OT LONG TERM GOAL #1   Title  Patient will reach her highest level of independence while using her RUE as her dominant extremity for 75% or more tasks.    Time  8    Period  Weeks    Status  On-going      OT LONG TERM GOAL #2   Title  Patient will increase her A/ROM of her RUE to [facility] in order to complete overhead reaching tasks with increased comfort and ease.    Time  8    Period  Weeks    Status  On-going      OT LONG TERM GOAL #3   Title  Patient will increase RUE strength to 4/5 in order to complete normal household lifting tasks while using her RUE.    Time  8    Period  Weeks    Status  On-going      OT LONG TERM GOAL #4   Title  Patient will decrease her RUE fascial restrictions to min amount or less in order to increase her functional mobility needed when reaching into the overhead cabinets.    Time  8    Period  Weeks    Status  On-going      OT LONG TERM GOAL #5   Title  Pt will report a decrease in pain of approximately 2/10 or less when completing daily tasks with her RUE.    Time  8     Period  Weeks    Status  On-going            Plan - 11/03/18 1154    Clinical Impression Statement  A: Initated myofascial release and manual stretching. patient was able to tolerate to recommended ROM limitations this date. Continues to have some pain and discomfort during exercises and manual stretching although able to tolerate. VC for form and technique.    Body Structure / Function / Physical Skills  ADL;ROM;Scar mobility;IADL;Edema;Fascial restriction;Mobility;Strength;Coordination;Decreased knowledge of use of DME;Decreased knowledge of precautions;UE functional use;Pain    Plan  P: Add pulleys if able to tolerate.    Consulted and Agree with Plan of Care  Patient       Patient will benefit from skilled therapeutic intervention in order to improve the following deficits and impairments:   Body Structure / Function / Physical Skills: ADL,  ROM, Scar mobility, IADL, Edema, Fascial restriction, Mobility, Strength, Coordination, Decreased knowledge of use of DME, Decreased knowledge of precautions, UE functional use, Pain       Visit Diagnosis: 1. Acute pain of right shoulder   2. Stiffness of right shoulder, not elsewhere classified   3. Other symptoms and signs involving the musculoskeletal system       Problem List Patient Active Problem List   Diagnosis Date Noted  . S/P reverse total shoulder arthroplasty, right 10/13/2018  . Fibroid uterus 11/11/2017  . Fibroids 11/03/2017  . Acute blood loss anemia 11/22/2015  . Colitis 11/22/2015  . UGI bleed   . Melena   . History of Helicobacter pylori infection   . Other fatigue   . Weakness   . Collagenous colitis 07/16/2011  . Chronic diarrhea 06/09/2011  . History of peptic ulcer disease 05/08/2011  . Personal history of colonic polyps 05/08/2011  . Family history of malignant neoplasm of gastrointestinal tract 05/08/2011  . Other general symptoms  05/08/2011  . Iron deficiency anemia, unspecified  05/08/2011  .  Diarrhea 05/08/2011  . Diverticulosis of colon (without mention of hemorrhage) 05/08/2011     Ailene Ravel, OTR/L,CBIS  [phone]  11/03/2018, 11:59 AM  [facility] [address] [place], [place], [address] Phone: [phone]   Fax:  [phone]  Name: Carol Wells MRN: [id number] Date of Birth: June 13, 1931

## 2018-11-07 DIAGNOSIS — M19049 Primary osteoarthritis, unspecified hand: Secondary | ICD-10-CM | POA: Diagnosis not present

## 2018-11-07 DIAGNOSIS — Z23 Encounter for immunization: Secondary | ICD-10-CM | POA: Diagnosis not present

## 2018-11-07 DIAGNOSIS — E782 Mixed hyperlipidemia: Secondary | ICD-10-CM | POA: Diagnosis not present

## 2018-11-07 DIAGNOSIS — M545 Low back pain: Secondary | ICD-10-CM | POA: Diagnosis not present

## 2018-11-07 DIAGNOSIS — Z96611 Presence of right artificial shoulder joint: Secondary | ICD-10-CM | POA: Diagnosis not present

## 2018-11-07 DIAGNOSIS — G8929 Other chronic pain: Secondary | ICD-10-CM | POA: Diagnosis not present

## 2018-11-07 DIAGNOSIS — E039 Hypothyroidism, unspecified: Secondary | ICD-10-CM | POA: Diagnosis not present

## 2018-11-07 DIAGNOSIS — Z90710 Acquired absence of both cervix and uterus: Secondary | ICD-10-CM | POA: Diagnosis not present

## 2018-11-07 DIAGNOSIS — M8580 Other specified disorders of bone density and structure, unspecified site: Secondary | ICD-10-CM | POA: Diagnosis not present

## 2018-11-07 DIAGNOSIS — I10 Essential (primary) hypertension: Secondary | ICD-10-CM | POA: Diagnosis not present

## 2018-11-07 DIAGNOSIS — Z8711 Personal history of peptic ulcer disease: Secondary | ICD-10-CM | POA: Diagnosis not present

## 2018-11-07 DIAGNOSIS — Z0001 Encounter for general adult medical examination with abnormal findings: Secondary | ICD-10-CM | POA: Diagnosis not present

## 2018-11-08 ENCOUNTER — Encounter (HOSPITAL_COMMUNITY): Payer: Self-pay

## 2018-11-08 ENCOUNTER — Other Ambulatory Visit: Payer: Self-pay

## 2018-11-08 ENCOUNTER — Ambulatory Visit (HOSPITAL_COMMUNITY): Payer: Medicare Other

## 2018-11-08 DIAGNOSIS — M25511 Pain in right shoulder: Secondary | ICD-10-CM | POA: Diagnosis not present

## 2018-11-08 DIAGNOSIS — M25611 Stiffness of right shoulder, not elsewhere classified: Secondary | ICD-10-CM | POA: Diagnosis not present

## 2018-11-08 DIAGNOSIS — R29898 Other symptoms and signs involving the musculoskeletal system: Secondary | ICD-10-CM

## 2018-11-08 NOTE — Therapy (Signed)
[place], [place], [address] Phone: [phone]   Fax:  [phone]  Occupational Therapy Treatment  Patient Details  Name: Carol Wells MRN: [id number] Date of Birth: 08-13-1931 Referring Provider (OT): Justice Britain, MD   Encounter Date: 11/08/2018  OT End of Session - 11/08/18 1025    Visit Number  3    Number of Visits  16    Date for OT Re-Evaluation  12/27/18    Authorization Type  1) Medicare 80/20% 2) BC[employer] $10 copay with 30 visit limit. 0 used.    Authorization Time Period  see above    Authorization - Visit Number  3    Authorization - Number of Visits  30    OT Start Time  [phone]    OT Stop Time  1010    OT Time Calculation (min)  37 min    Activity Tolerance  Patient tolerated treatment well    Behavior During Therapy  WFL for tasks assessed/performed       Past Medical History:  Diagnosis Date  . Anemia 11/2015  . Arthritis   . Blood transfusion 2017 JULY 22  . Carotid stenosis    Dopplers 5/27: RICA 78-24%, LICA 2-35%  . Cataract    REMOVED  . Chronic headaches    HX MIGRAINES  . Collagenous colitis   . Diverticulosis 2013   Colonoscopy  . Gastric ulcer 11/2015  . Glaucoma    BOTH EYES  . History of spinal stenosis   . History of uterine fibroid   . Hx of cardiac catheterization    LHC 3/14: no angiographic CAD, EF 65%  . Hx of echocardiogram    Echocardiogram 06/21/12: Mild LVH, EF 36-14%, grade 2 diastolic dysfunction, mild MR  . Hyperlipidemia   . Hypertension   . Hypothyroidism   . Internal hemorrhoids 2009   Colonoscopy  . UTI (urinary tract infection) HX OF    Past Surgical History:  Procedure Laterality Date  . BUNIONECTOMY  YRS AGO LEFT   RIGHT CONE AT [facility] 3 YRS AGO  . BUNIONECTOMY Left   . CATARACT EXTRACTION W/ INTRAOCULAR LENS IMPLANT  03/2010   Left  . CATARACT EXTRACTION W/ INTRAOCULAR LENS IMPLANT Bilateral 11/2010  . COLONOSCOPY  2013   NORMAL   .  ESOPHAGOGASTRODUODENOSCOPY N/A 11/23/2015   Procedure: ESOPHAGOGASTRODUODENOSCOPY (EGD);  Surgeon: Rogene Houston, MD;  [facility];  Service: Endoscopy;  Laterality: N/A;  . EUS N/A 12/19/2015   Procedure: ESOPHAGEAL ENDOSCOPIC ULTRASOUND (EUS) RADIAL;  Surgeon: Milus Banister, MD;  [facility];  Service: Endoscopy;  Laterality: N/A;  . FUNCTIONAL ENDOSCOPIC SINUS SURGERY    . HAND SURGERY Right 1998  . INGUINAL HERNIA REPAIR Left 03/11/2016   Procedure: LEFT INGUINAL HERINA REPAIR WITH MESH;  Surgeon: Donnie Mesa, MD;  Location: [facility];  Service: General;  Laterality: Left;  . INSERTION OF MESH Left 03/11/2016   Procedure: INSERTION OF MESH;  Surgeon: Donnie Mesa, MD;  Location: [facility];  Service: General;  Laterality: Left;  . IR RADIOLOGIST EVAL & MGMT  07/01/2017  . LAMINECTOMY  03/2008  . LAPAROTOMY N/A 11/11/2017   Procedure: MINI LAPAROTOMY;  Surgeon: Everitt Amber, MD;  [facility];  Service: Gynecology;  Laterality: N/A;  . POLYPECTOMY    . REVERSE SHOULDER ARTHROPLASTY Right 10/13/2018   Procedure: REVERSE SHOULDER ARTHROPLASTY;  Surgeon: Justice Britain, MD;  [facility];  Service: Orthopedics;  Laterality: Right;  161mn  . ROBOTIC ASSISTED TOTAL HYSTERECTOMY WITH BILATERAL SALPINGO OOPHERECTOMY Bilateral 11/11/2017   Procedure: XI ROBOTIC ASSISTED TOTAL HYSTERECTOMY WITH BILATERAL SALPINGO OOPHORECTOMY FOR UTERUS GREATER THAN 250 GRAMS;  Surgeon: REveritt Amber MD;  [facility];  Service: Gynecology;  Laterality: Bilateral;  . TOTAL HIP ARTHROPLASTY  2008   Right    There were no vitals filed for this visit.  Subjective Assessment - 11/08/18 0949    Subjective   S: The out to the side on the table is still difficult.    Currently in Pain?  Yes    Pain Score  3     Pain Location  Shoulder    Pain Orientation  Right    Pain Descriptors / Indicators  Aching;Constant         [facility] - 11/08/18 0950      Assessment   Medical  Diagnosis  Right shoulder reverse total arthroplasty      Precautions   Precautions  Shoulder    Type of Shoulder Precautions  See protocol. No behind the back movement or extension of shoulder for 6 weeks.     Shoulder Interventions  Shoulder sling/immobilizer               OT Treatments/Exercises (OP) - 11/08/18 0950      Exercises   Exercises  Shoulder      Shoulder Exercises: Supine   Protraction  PROM;10 reps    External Rotation  PROM;10 reps    Internal Rotation  PROM;10 reps    Flexion  PROM;10 reps    ABduction  PROM;10 reps      Shoulder Exercises: Seated   Other Seated Exercises  Scapular depression; 10X; A/ROM    Other Seated Exercises  Uppercut; A/ROM 10X      Shoulder Exercises: Standing   Other Standing Exercises  Table wash; 1' ; A/ROM      Shoulder Exercises: Pulleys   Flexion  Other (comment)   completed 3 times slowly     Shoulder Exercises: Therapy Ball   Flexion  10 reps    ABduction  10 reps      Shoulder Exercises: ROM/Strengthening   Thumb Tacks  1' low level (using back of chair to support arm)    Prot/Ret//Elev/Dep  1' elbows at 90 degrees      Manual Therapy   Manual Therapy  Myofascial release    Manual therapy comments  Manual therapy completed prior to exercises    Myofascial Release  Myofascial release and manual stretching completed to right upper arm, trapezius, and scapularis region to decrease fascial restrictions and increase joint mobility in a pain free zone.              OT Education - 11/08/18 1025    Education Details  Discussed how to safely begin to wean from the sling.    Person(s) Educated  Patient    Methods  Explanation    Comprehension  Verbalized understanding       OT Short Term Goals - 11/03/18 1007      OT SHORT TERM GOAL #1   Title  Patient will be educated and independent with HEP to faciliate progress in therapy and allow her to return to using her RUE as her dominant extremity.    Time  4     Period  Weeks    Status  On-going    Target Date  11/29/18      OT SHORT TERM  GOAL #2   Title  Patient will increase RUE P/ROM to [facility] in order to increase ability to complete UB dressing tasks with less difficulty.    Time  4    Period  Weeks    Status  On-going      OT SHORT TERM GOAL #3   Title  Patient will increase RUE strength to 3/5 in order to complete waist level activities with more strength and stability.    Time  4    Period  Weeks    Status  On-going      OT SHORT TERM GOAL #4   Title  Patient will decrease fascial restrictions in her RUE to mod amount or less in order to increase functional mobility needed to complete reaching tasks.    Time  4    Period  Weeks    Status  On-going      OT SHORT TERM GOAL #5   Title  Patient will decrease pain level to approximately 5/10 during functional daily tasks.    Time  4    Period  Weeks    Status  On-going        OT Long Term Goals - 11/03/18 1007      OT LONG TERM GOAL #1   Title  Patient will reach her highest level of independence while using her RUE as her dominant extremity for 75% or more tasks.    Time  8    Period  Weeks    Status  On-going      OT LONG TERM GOAL #2   Title  Patient will increase her A/ROM of her RUE to [facility] in order to complete overhead reaching tasks with increased comfort and ease.    Time  8    Period  Weeks    Status  On-going      OT LONG TERM GOAL #3   Title  Patient will increase RUE strength to 4/5 in order to complete normal household lifting tasks while using her RUE.    Time  8    Period  Weeks    Status  On-going      OT LONG TERM GOAL #4   Title  Patient will decrease her RUE fascial restrictions to min amount or less in order to increase her functional mobility needed when reaching into the overhead cabinets.    Time  8    Period  Weeks    Status  On-going      OT LONG TERM GOAL #5   Title  Pt will report a decrease in pain of approximately 2/10 or less when  completing daily tasks with her RUE.    Time  8    Period  Weeks    Status  On-going            Plan - 11/08/18 1025    Clinical Impression Statement  A: Added flexion with pulleys, thumb tacks, pro/ret/elev/dep this session. Pt required max VC for form and technique. Completes all exercises cautiously during session. Decreased fascial retrictions palpated in RUE with only Minimal amount in upper trapezius region.    Body Structure / Function / Physical Skills  ADL;ROM;Scar mobility;IADL;Edema;Fascial restriction;Mobility;Strength;Coordination;Decreased knowledge of use of DME;Decreased knowledge of precautions;UE functional use;Pain    Plan  P: Progress to phase 2 of protocol. Increase P/ROM as able to tolerate. Begin supine AA/ROM. Add additional isometrics with the exception of extension.    Consulted and Agree with Plan of Care  Patient       Patient will benefit from skilled therapeutic intervention in order to improve the following deficits and impairments:   Body Structure / Function / Physical Skills: ADL, ROM, Scar mobility, IADL, Edema, Fascial restriction, Mobility, Strength, Coordination, Decreased knowledge of use of DME, Decreased knowledge of precautions, UE functional use, Pain       Visit Diagnosis: 1. Other symptoms and signs involving the musculoskeletal system   2. Stiffness of right shoulder, not elsewhere classified   3. Acute pain of right shoulder       Problem List Patient Active Problem List   Diagnosis Date Noted  . S/P reverse total shoulder arthroplasty, right 10/13/2018  . Fibroid uterus 11/11/2017  . Fibroids 11/03/2017  . Acute blood loss anemia 11/22/2015  . Colitis 11/22/2015  . UGI bleed   . Melena   . History of Helicobacter pylori infection   . Other fatigue   . Weakness   . Collagenous colitis 07/16/2011  . Chronic diarrhea 06/09/2011  . History of peptic ulcer disease 05/08/2011  . Personal history of colonic polyps 05/08/2011   . Family history of malignant neoplasm of gastrointestinal tract 05/08/2011  . Other general symptoms  05/08/2011  . Iron deficiency anemia, unspecified  05/08/2011  . Diarrhea 05/08/2011  . Diverticulosis of colon (without mention of hemorrhage) 05/08/2011   Ailene Ravel, OTR/L,CBIS  [phone]  11/08/2018, 10:29 AM  [place] [address] [place], [place], [address] Phone: [phone]   Fax:  [phone]  Name: LANIYAH ROSENWALD MRN: [id number] Date of Birth: June 17, 1931

## 2018-11-10 ENCOUNTER — Encounter (HOSPITAL_COMMUNITY): Payer: Self-pay

## 2018-11-10 ENCOUNTER — Ambulatory Visit (HOSPITAL_COMMUNITY): Payer: Medicare Other

## 2018-11-10 ENCOUNTER — Other Ambulatory Visit: Payer: Self-pay

## 2018-11-10 DIAGNOSIS — R29898 Other symptoms and signs involving the musculoskeletal system: Secondary | ICD-10-CM

## 2018-11-10 DIAGNOSIS — M25611 Stiffness of right shoulder, not elsewhere classified: Secondary | ICD-10-CM

## 2018-11-10 DIAGNOSIS — M25511 Pain in right shoulder: Secondary | ICD-10-CM | POA: Diagnosis not present

## 2018-11-10 NOTE — Therapy (Signed)
[place], [place], [address] Phone: [phone]   Fax:  [phone]  Occupational Therapy Treatment  Patient Details  Name: Carol Wells MRN: [id number] Date of Birth: 1931-11-30 Referring Provider (OT): Justice Britain, MD   Encounter Date: 11/10/2018  OT End of Session - 11/10/18 1028    Visit Number  4    Number of Visits  16    Date for OT Re-Evaluation  12/27/18    Authorization Type  1) Medicare 80/20% 2) [employer] $10 copay with 30 visit limit. 0 used.    Authorization Time Period  see above    Authorization - Visit Number  4    Authorization - Number of Visits  30    OT Start Time  [id number]    OT Stop Time  1005    OT Time Calculation (min)  40 min    Activity Tolerance  Patient tolerated treatment well    Behavior During Therapy  WFL for tasks assessed/performed       Past Medical History:  Diagnosis Date  . Anemia 11/2015  . Arthritis   . Blood transfusion 2017 JULY 22  . Carotid stenosis    Dopplers 3/53: RICA 61-44%, LICA 3-15%  . Cataract    REMOVED  . Chronic headaches    HX MIGRAINES  . Collagenous colitis   . Diverticulosis 2013   Colonoscopy  . Gastric ulcer 11/2015  . Glaucoma    BOTH EYES  . History of spinal stenosis   . History of uterine fibroid   . Hx of cardiac catheterization    LHC 3/14: no angiographic CAD, EF 65%  . Hx of echocardiogram    Echocardiogram 06/21/12: Mild LVH, EF 40-08%, grade 2 diastolic dysfunction, mild MR  . Hyperlipidemia   . Hypertension   . Hypothyroidism   . Internal hemorrhoids 2009   Colonoscopy  . UTI (urinary tract infection) HX OF    Past Surgical History:  Procedure Laterality Date  . BUNIONECTOMY  YRS AGO LEFT   RIGHT CONE AT [facility] 3 YRS AGO  . BUNIONECTOMY Left   . CATARACT EXTRACTION W/ INTRAOCULAR LENS IMPLANT  03/2010   Left  . CATARACT EXTRACTION W/ INTRAOCULAR LENS IMPLANT Bilateral 11/2010  . COLONOSCOPY  2013   NORMAL   .  ESOPHAGOGASTRODUODENOSCOPY N/A 11/23/2015   Procedure: ESOPHAGOGASTRODUODENOSCOPY (EGD);  Surgeon: Rogene Houston, MD;  [facility];  Service: Endoscopy;  Laterality: N/A;  . EUS N/A 12/19/2015   Procedure: ESOPHAGEAL ENDOSCOPIC ULTRASOUND (EUS) RADIAL;  Surgeon: Milus Banister, MD;  [facility];  Service: Endoscopy;  Laterality: N/A;  . FUNCTIONAL ENDOSCOPIC SINUS SURGERY    . HAND SURGERY Right 1998  . INGUINAL HERNIA REPAIR Left 03/11/2016   Procedure: LEFT INGUINAL HERINA REPAIR WITH MESH;  Surgeon: Donnie Mesa, MD;  Location: [place];  Service: General;  Laterality: Left;  . INSERTION OF MESH Left 03/11/2016   Procedure: INSERTION OF MESH;  Surgeon: Donnie Mesa, MD;  Location: [place];  Service: General;  Laterality: Left;  . IR RADIOLOGIST EVAL & MGMT  07/01/2017  . LAMINECTOMY  03/2008  . LAPAROTOMY N/A 11/11/2017   Procedure: MINI LAPAROTOMY;  Surgeon: Everitt Amber, MD;  [facility];  Service: Gynecology;  Laterality: N/A;  . POLYPECTOMY    . REVERSE SHOULDER ARTHROPLASTY Right 10/13/2018   Procedure: REVERSE SHOULDER ARTHROPLASTY;  Surgeon: Justice Britain, MD;  [facility];  Service: Orthopedics;  Laterality: Right;  136min  . ROBOTIC ASSISTED TOTAL HYSTERECTOMY WITH BILATERAL SALPINGO OOPHERECTOMY Bilateral 11/11/2017   Procedure: XI ROBOTIC ASSISTED TOTAL HYSTERECTOMY WITH BILATERAL SALPINGO OOPHORECTOMY FOR UTERUS GREATER THAN 250 GRAMS;  Surgeon: Everitt Amber, MD;  [facility];  Service: Gynecology;  Laterality: Bilateral;  . TOTAL HIP ARTHROPLASTY  2008   Right    There were no vitals filed for this visit.  Subjective Assessment - 11/10/18 0943    Subjective   S: I took some pain medication today because I knew we were doing some new things today.    Currently in Pain?  Yes    Pain Score  4     Pain Location  Shoulder    Pain Orientation  Right    Pain Descriptors / Indicators  Aching;Constant    Pain Type  Surgical pain    Pain Radiating  Towards  N/A    Pain Onset  1 to 4 weeks ago    Pain Frequency  Constant    Aggravating Factors   being out of the sling    Pain Relieving Factors  pain medication if needed, relaxation techniques, ice machine    Effect of Pain on Daily Activities  Unable to utilize her right UE at this time.         [facility] OT Assessment - 11/10/18 0944      Assessment   Medical Diagnosis  Right shoulder reverse total arthroplasty      Precautions   Precautions  Shoulder    Type of Shoulder Precautions  See protocol. No behind the back movement or extension of shoulder for 6 weeks.     Shoulder Interventions  Shoulder sling/immobilizer               OT Treatments/Exercises (OP) - 11/10/18 0944      Exercises   Exercises  Shoulder      Shoulder Exercises: Supine   Protraction  PROM;10 reps;AAROM;5 reps    Horizontal ABduction  PROM;10 reps    External Rotation  PROM;AAROM;10 reps    Internal Rotation  PROM;AAROM;10 reps    Flexion  PROM;10 reps;AAROM;5 reps      Shoulder Exercises: Standing   Protraction  AAROM;5 reps    External Rotation  AAROM;10 reps    Internal Rotation  AAROM;10 reps    Flexion  AAROM;5 reps    Other Standing Exercises  Table wash; 1' ; A/ROM; seated      Shoulder Exercises: Pulleys   Flexion  1 minute    ABduction  1 minute      Shoulder Exercises: Isometric Strengthening   Flexion  Supine;3X5"    External Rotation  Supine;3X5"    Internal Rotation  Supine;3X5"    ABduction  Supine;3X5"    ADduction  Supine;3X5"      Manual Therapy   Manual Therapy  Myofascial release    Manual therapy comments  Manual therapy completed prior to exercises    Myofascial Release  Myofascial release and manual stretching completed to right upper arm, trapezius, and scapularis region to decrease fascial restrictions and increase joint mobility in a pain free zone.                OT Short Term Goals - 11/03/18 1007      OT SHORT TERM GOAL #1   Title   Patient will be educated and independent with HEP to faciliate progress in therapy and allow her to return to using her RUE as her dominant extremity.  Time  4    Period  Weeks    Status  On-going    Target Date  11/29/18      OT SHORT TERM GOAL #2   Title  Patient will increase RUE P/ROM to [facility] in order to increase ability to complete UB dressing tasks with less difficulty.    Time  4    Period  Weeks    Status  On-going      OT SHORT TERM GOAL #3   Title  Patient will increase RUE strength to 3/5 in order to complete waist level activities with more strength and stability.    Time  4    Period  Weeks    Status  On-going      OT SHORT TERM GOAL #4   Title  Patient will decrease fascial restrictions in her RUE to mod amount or less in order to increase functional mobility needed to complete reaching tasks.    Time  4    Period  Weeks    Status  On-going      OT SHORT TERM GOAL #5   Title  Patient will decrease pain level to approximately 5/10 during functional daily tasks.    Time  4    Period  Weeks    Status  On-going        OT Long Term Goals - 11/03/18 1007      OT LONG TERM GOAL #1   Title  Patient will reach her highest level of independence while using her RUE as her dominant extremity for 75% or more tasks.    Time  8    Period  Weeks    Status  On-going      OT LONG TERM GOAL #2   Title  Patient will increase her A/ROM of her RUE to [facility] in order to complete overhead reaching tasks with increased comfort and ease.    Time  8    Period  Weeks    Status  On-going      OT LONG TERM GOAL #3   Title  Patient will increase RUE strength to 4/5 in order to complete normal household lifting tasks while using her RUE.    Time  8    Period  Weeks    Status  On-going      OT LONG TERM GOAL #4   Title  Patient will decrease her RUE fascial restrictions to min amount or less in order to increase her functional mobility needed when reaching into the overhead cabinets.     Time  8    Period  Weeks    Status  On-going      OT LONG TERM GOAL #5   Title  Pt will report a decrease in pain of approximately 2/10 or less when completing daily tasks with her RUE.    Time  8    Period  Weeks    Status  On-going            Plan - 11/10/18 1029    Clinical Impression Statement  A: Progressed to phase II this session of protocol. patient did not meet 115 degrees of shoulder flexion passively although was close. Able to meet 90 degrees abduction and 45 degrees external rotation. Added all isometrics except extension. Patient complete AA/ROM supine and seated with decreased reps for just right challenge. Patient required VC for form and technique.    Body Structure / Function / Physical Skills  ADL;ROM;Scar mobility;IADL;Edema;Fascial  restriction;Mobility;Strength;Coordination;Decreased knowledge of use of DME;Decreased knowledge of precautions;UE functional use;Pain    Plan  P: Continue with phase 2. Increase AA/ROM to 10 reps. Add to HEP.    Consulted and Agree with Plan of Care  Patient       Patient will benefit from skilled therapeutic intervention in order to improve the following deficits and impairments:   Body Structure / Function / Physical Skills: ADL, ROM, Scar mobility, IADL, Edema, Fascial restriction, Mobility, Strength, Coordination, Decreased knowledge of use of DME, Decreased knowledge of precautions, UE functional use, Pain       Visit Diagnosis: 1. Stiffness of right shoulder, not elsewhere classified   2. Other symptoms and signs involving the musculoskeletal system   3. Acute pain of right shoulder       Problem List Patient Active Problem List   Diagnosis Date Noted  . S/P reverse total shoulder arthroplasty, right 10/13/2018  . Fibroid uterus 11/11/2017  . Fibroids 11/03/2017  . Acute blood loss anemia 11/22/2015  . Colitis 11/22/2015  . UGI bleed   . Melena   . History of Helicobacter pylori infection   . Other fatigue    . Weakness   . Collagenous colitis 07/16/2011  . Chronic diarrhea 06/09/2011  . History of peptic ulcer disease 05/08/2011  . Personal history of colonic polyps 05/08/2011  . Family history of malignant neoplasm of gastrointestinal tract 05/08/2011  . Other general symptoms  05/08/2011  . Iron deficiency anemia, unspecified  05/08/2011  . Diarrhea 05/08/2011  . Diverticulosis of colon (without mention of hemorrhage) 05/08/2011   Carol Wells, Carol Wells,Carol Wells  [phone]  11/10/2018, 10:31 AM  [place], [place], [address] Phone: [phone]   Fax:  [phone]  Name: Carol Wells MRN: [id number] Date of Birth: 04-06-1932

## 2018-11-15 ENCOUNTER — Other Ambulatory Visit: Payer: Self-pay

## 2018-11-15 ENCOUNTER — Ambulatory Visit (HOSPITAL_COMMUNITY): Payer: Medicare Other

## 2018-11-15 ENCOUNTER — Encounter (HOSPITAL_COMMUNITY): Payer: Self-pay

## 2018-11-15 DIAGNOSIS — M25611 Stiffness of right shoulder, not elsewhere classified: Secondary | ICD-10-CM | POA: Diagnosis not present

## 2018-11-15 DIAGNOSIS — R29898 Other symptoms and signs involving the musculoskeletal system: Secondary | ICD-10-CM

## 2018-11-15 DIAGNOSIS — M25511 Pain in right shoulder: Secondary | ICD-10-CM

## 2018-11-15 NOTE — Patient Instructions (Signed)

## 2018-11-15 NOTE — Therapy (Signed)
[place], [place], [address] Phone: [phone]   Fax:  [phone]  Occupational Therapy Treatment  Patient Details  Name: Carol Wells MRN: [id number] Date of Birth: 19-Jun-1931 Referring Provider (OT): Justice Britain, MD   Encounter Date: 11/15/2018  OT End of Session - 11/15/18 1003    Visit Number  5    Number of Visits  16    Date for OT Re-Evaluation  12/27/18    Authorization Type  1) Medicare 80/20% 2) [employer] $10 copay with 30 visit limit. 0 used.    Authorization Time Period  see above    Authorization - Visit Number  5    Authorization - Number of Visits  30    OT Start Time  0935    OT Stop Time  1015    OT Time Calculation (min)  40 min    Activity Tolerance  Patient tolerated treatment well    Behavior During Therapy  WFL for tasks assessed/performed       Past Medical History:  Diagnosis Date  . Anemia 11/2015  . Arthritis   . Blood transfusion 2017 JULY 22  . Carotid stenosis    Dopplers 9/56: RICA 38-75%, LICA 6-43%  . Cataract    REMOVED  . Chronic headaches    HX MIGRAINES  . Collagenous colitis   . Diverticulosis 2013   Colonoscopy  . Gastric ulcer 11/2015  . Glaucoma    BOTH EYES  . History of spinal stenosis   . History of uterine fibroid   . Hx of cardiac catheterization    LHC 3/14: no angiographic CAD, EF 65%  . Hx of echocardiogram    Echocardiogram 06/21/12: Mild LVH, EF 32-95%, grade 2 diastolic dysfunction, mild MR  . Hyperlipidemia   . Hypertension   . Hypothyroidism   . Internal hemorrhoids 2009   Colonoscopy  . UTI (urinary tract infection) HX OF    Past Surgical History:  Procedure Laterality Date  . BUNIONECTOMY  YRS AGO LEFT   RIGHT CONE AT [facility] 3 YRS AGO  . BUNIONECTOMY Left   . CATARACT EXTRACTION W/ INTRAOCULAR LENS IMPLANT  03/2010   Left  . CATARACT EXTRACTION W/ INTRAOCULAR LENS IMPLANT Bilateral 11/2010  . COLONOSCOPY  2013   NORMAL   .  ESOPHAGOGASTRODUODENOSCOPY N/A 11/23/2015   Procedure: ESOPHAGOGASTRODUODENOSCOPY (EGD);  Surgeon: Rogene Houston, MD;  [facility];  Service: Endoscopy;  Laterality: N/A;  . EUS N/A 12/19/2015   Procedure: ESOPHAGEAL ENDOSCOPIC ULTRASOUND (EUS) RADIAL;  Surgeon: Milus Banister, MD;  [facility];  Service: Endoscopy;  Laterality: N/A;  . FUNCTIONAL ENDOSCOPIC SINUS SURGERY    . HAND SURGERY Right 1998  . INGUINAL HERNIA REPAIR Left 03/11/2016   Procedure: LEFT INGUINAL HERINA REPAIR WITH MESH;  Surgeon: Donnie Mesa, MD;  Location: [facility];  Service: General;  Laterality: Left;  . INSERTION OF MESH Left 03/11/2016   Procedure: INSERTION OF MESH;  Surgeon: Donnie Mesa, MD;  Location: [facility];  Service: General;  Laterality: Left;  . IR RADIOLOGIST EVAL & MGMT  07/01/2017  . LAMINECTOMY  03/2008  . LAPAROTOMY N/A 11/11/2017   Procedure: MINI LAPAROTOMY;  Surgeon: Everitt Amber, MD;  [facility];  Service: Gynecology;  Laterality: N/A;  . POLYPECTOMY    . REVERSE SHOULDER ARTHROPLASTY Right 10/13/2018   Procedure: REVERSE SHOULDER ARTHROPLASTY;  Surgeon: Justice Britain, MD;  [facility];  Service: Orthopedics;  Laterality: Right;  162min  . ROBOTIC ASSISTED TOTAL HYSTERECTOMY WITH BILATERAL SALPINGO OOPHERECTOMY Bilateral 11/11/2017   Procedure: XI ROBOTIC ASSISTED TOTAL HYSTERECTOMY WITH BILATERAL SALPINGO OOPHORECTOMY FOR UTERUS GREATER THAN 250 GRAMS;  Surgeon: Everitt Amber, MD;  [facility];  Service: Gynecology;  Laterality: Bilateral;  . TOTAL HIP ARTHROPLASTY  2008   Right    There were no vitals filed for this visit.  Subjective Assessment - 11/15/18 0950    Subjective   S: I'm ready to get out of this sling.    Currently in Pain?  Yes    Pain Score  3     Pain Location  Shoulder    Pain Orientation  Right    Pain Descriptors / Indicators  Aching;Constant    Pain Type  Surgical pain         [facility] OT Assessment - 11/15/18 1000      Assessment    Medical Diagnosis  Right shoulder reverse total arthroplasty      Precautions   Precautions  Shoulder    Type of Shoulder Precautions  See protocol. No behind the back movement or extension of shoulder for 6 weeks.     Shoulder Interventions  Shoulder sling/immobilizer               OT Treatments/Exercises (OP) - 11/15/18 0959      Exercises   Exercises  Shoulder      Shoulder Exercises: Supine   Protraction  PROM;5 reps;AAROM;10 reps    Horizontal ABduction  PROM;5 reps;AAROM;10 reps    External Rotation  PROM;5 reps;AAROM;10 reps    Internal Rotation  PROM;5 reps;AAROM;10 reps    Flexion  PROM;5 reps;AAROM;10 reps    ABduction  PROM;5 reps;AAROM;10 reps      Shoulder Exercises: Standing   Protraction  AAROM;10 reps    Horizontal ABduction  AAROM;10 reps;Limitations   not at shoulder level   External Rotation  AAROM;10 reps    Internal Rotation  AAROM;10 reps    Flexion  AAROM;10 reps    ABduction  AAROM;10 reps      Manual Therapy   Manual Therapy  Myofascial release    Manual therapy comments  Manual therapy completed prior to exercises    Myofascial Release  Myofascial release and manual stretching completed to right upper arm, trapezius, and scapularis region to decrease fascial restrictions and increase joint mobility in a pain free zone.              OT Education - 11/15/18 1003    Education Details  AA/ROM shoulder exercises. Pt to stop all previous exercises.    Person(s) Educated  Patient    Methods  Explanation;Demonstration;Verbal cues;Handout;Tactile cues    Comprehension  Returned demonstration;Verbalized understanding       OT Short Term Goals - 11/03/18 1007      OT SHORT TERM GOAL #1   Title  Patient will be educated and independent with HEP to faciliate progress in therapy and allow her to return to using her RUE as her dominant extremity.    Time  4    Period  Weeks    Status  On-going    Target Date  11/29/18      OT SHORT TERM  GOAL #2   Title  Patient will increase RUE P/ROM to [facility] in order to increase ability to complete UB dressing tasks with less difficulty.    Time  4    Period  Weeks    Status  On-going      OT SHORT TERM GOAL #3   Title  Patient will increase RUE strength to 3/5 in order to complete waist level activities with more strength and stability.    Time  4    Period  Weeks    Status  On-going      OT SHORT TERM GOAL #4   Title  Patient will decrease fascial restrictions in her RUE to mod amount or less in order to increase functional mobility needed to complete reaching tasks.    Time  4    Period  Weeks    Status  On-going      OT SHORT TERM GOAL #5   Title  Patient will decrease pain level to approximately 5/10 during functional daily tasks.    Time  4    Period  Weeks    Status  On-going        OT Long Term Goals - 11/03/18 1007      OT LONG TERM GOAL #1   Title  Patient will reach her highest level of independence while using her RUE as her dominant extremity for 75% or more tasks.    Time  8    Period  Weeks    Status  On-going      OT LONG TERM GOAL #2   Title  Patient will increase her A/ROM of her RUE to [facility] in order to complete overhead reaching tasks with increased comfort and ease.    Time  8    Period  Weeks    Status  On-going      OT LONG TERM GOAL #3   Title  Patient will increase RUE strength to 4/5 in order to complete normal household lifting tasks while using her RUE.    Time  8    Period  Weeks    Status  On-going      OT LONG TERM GOAL #4   Title  Patient will decrease her RUE fascial restrictions to min amount or less in order to increase her functional mobility needed when reaching into the overhead cabinets.    Time  8    Period  Weeks    Status  On-going      OT LONG TERM GOAL #5   Title  Pt will report a decrease in pain of approximately 2/10 or less when completing daily tasks with her RUE.    Time  8    Period  Weeks    Status  On-going             Plan - 11/15/18 1055    Clinical Impression Statement  A: Progressed to AA/ROM for 10 repetitions supine and standing. HEP was updated. VC were provided for form and technique.    Body Structure / Function / Physical Skills  ADL;ROM;Scar mobility;IADL;Edema;Fascial restriction;Mobility;Strength;Coordination;Decreased knowledge of use of DME;Decreased knowledge of precautions;UE functional use;Pain    Plan  P: Continue with AA/ROM and progress as able. Continue with pulleys. Add wall wash.       Patient will benefit from skilled therapeutic intervention in order to improve the following deficits and impairments:   Body Structure / Function / Physical Skills: ADL, ROM, Scar mobility, IADL, Edema, Fascial restriction, Mobility, Strength, Coordination, Decreased knowledge of use of DME, Decreased knowledge of precautions, UE functional use, Pain       Visit Diagnosis: 1. Stiffness of right shoulder, not elsewhere classified   2. Other symptoms and signs involving the  musculoskeletal system   3. Acute pain of right shoulder       Problem List Patient Active Problem List   Diagnosis Date Noted  . S/P reverse total shoulder arthroplasty, right 10/13/2018  . Fibroid uterus 11/11/2017  . Fibroids 11/03/2017  . Acute blood loss anemia 11/22/2015  . Colitis 11/22/2015  . UGI bleed   . Melena   . History of Helicobacter pylori infection   . Other fatigue   . Weakness   . Collagenous colitis 07/16/2011  . Chronic diarrhea 06/09/2011  . History of peptic ulcer disease 05/08/2011  . Personal history of colonic polyps 05/08/2011  . Family history of malignant neoplasm of gastrointestinal tract 05/08/2011  . Other general symptoms  05/08/2011  . Iron deficiency anemia, unspecified  05/08/2011  . Diarrhea 05/08/2011  . Diverticulosis of colon (without mention of hemorrhage) 05/08/2011   Ailene Ravel, OTR/L,CBIS  [phone]  11/15/2018, 10:58 AM  [place], [place], [address] Phone: [phone]   Fax:  [phone]  Name: Carol Wells MRN: [id number] Date of Birth: 1932/04/09

## 2018-11-17 ENCOUNTER — Ambulatory Visit (HOSPITAL_COMMUNITY): Payer: Medicare Other

## 2018-11-17 ENCOUNTER — Encounter (HOSPITAL_COMMUNITY): Payer: Self-pay

## 2018-11-17 ENCOUNTER — Other Ambulatory Visit: Payer: Self-pay

## 2018-11-17 DIAGNOSIS — R29898 Other symptoms and signs involving the musculoskeletal system: Secondary | ICD-10-CM

## 2018-11-17 DIAGNOSIS — M25511 Pain in right shoulder: Secondary | ICD-10-CM | POA: Diagnosis not present

## 2018-11-17 DIAGNOSIS — M25611 Stiffness of right shoulder, not elsewhere classified: Secondary | ICD-10-CM | POA: Diagnosis not present

## 2018-11-17 NOTE — Therapy (Signed)
[place], [place], [address] Phone: [phone]   Fax:  [phone]  Occupational Therapy Treatment  Patient Details  Name: Carol Wells MRN: [id number] Date of Birth: 05-Nov-1931 Referring Provider (OT): Justice Britain, MD   Encounter Date: 11/17/2018  OT End of Session - 11/17/18 1004    Visit Number  6    Number of Visits  16    Date for OT Re-Evaluation  12/27/18   mini reassess: 11/29/18   Authorization Type  1) Medicare 80/20% 2) [employer] $10 copay with 30 visit limit. 0 used.    Authorization Time Period  see above    Authorization - Visit Number  6    Authorization - Number of Visits  30    OT Start Time  [phone]    OT Stop Time  1015    OT Time Calculation (min)  42 min    Activity Tolerance  Patient tolerated treatment well    Behavior During Therapy  WFL for tasks assessed/performed       Past Medical History:  Diagnosis Date  . Anemia 11/2015  . Arthritis   . Blood transfusion 2017 JULY 22  . Carotid stenosis    Dopplers 3/79: RICA 02-40%, LICA 9-73%  . Cataract    REMOVED  . Chronic headaches    HX MIGRAINES  . Collagenous colitis   . Diverticulosis 2013   Colonoscopy  . Gastric ulcer 11/2015  . Glaucoma    BOTH EYES  . History of spinal stenosis   . History of uterine fibroid   . Hx of cardiac catheterization    LHC 3/14: no angiographic CAD, EF 65%  . Hx of echocardiogram    Echocardiogram 06/21/12: Mild LVH, EF 53-29%, grade 2 diastolic dysfunction, mild MR  . Hyperlipidemia   . Hypertension   . Hypothyroidism   . Internal hemorrhoids 2009   Colonoscopy  . UTI (urinary tract infection) HX OF    Past Surgical History:  Procedure Laterality Date  . BUNIONECTOMY  YRS AGO LEFT   RIGHT CONE AT [facility] 3 YRS AGO  . BUNIONECTOMY Left   . CATARACT EXTRACTION W/ INTRAOCULAR LENS IMPLANT  03/2010   Left  . CATARACT EXTRACTION W/ INTRAOCULAR LENS IMPLANT Bilateral 11/2010  . COLONOSCOPY   2013   NORMAL   . ESOPHAGOGASTRODUODENOSCOPY N/A 11/23/2015   Procedure: ESOPHAGOGASTRODUODENOSCOPY (EGD);  Surgeon: Rogene Houston, MD;  [facility];  Service: Endoscopy;  Laterality: N/A;  . EUS N/A 12/19/2015   Procedure: ESOPHAGEAL ENDOSCOPIC ULTRASOUND (EUS) RADIAL;  Surgeon: Milus Banister, MD;  [facility];  Service: Endoscopy;  Laterality: N/A;  . FUNCTIONAL ENDOSCOPIC SINUS SURGERY    . HAND SURGERY Right 1998  . INGUINAL HERNIA REPAIR Left 03/11/2016   Procedure: LEFT INGUINAL HERINA REPAIR WITH MESH;  Surgeon: Donnie Mesa, MD;  Location: [place];  Service: General;  Laterality: Left;  . INSERTION OF MESH Left 03/11/2016   Procedure: INSERTION OF MESH;  Surgeon: Donnie Mesa, MD;  Location: [facility];  Service: General;  Laterality: Left;  . IR RADIOLOGIST EVAL & MGMT  07/01/2017  . LAMINECTOMY  03/2008  . LAPAROTOMY N/A 11/11/2017   Procedure: MINI LAPAROTOMY;  Surgeon: Everitt Amber, MD;  [facility];  Service: Gynecology;  Laterality: N/A;  . POLYPECTOMY    . REVERSE SHOULDER ARTHROPLASTY Right 10/13/2018   Procedure: REVERSE SHOULDER ARTHROPLASTY;  Surgeon: Justice Britain, MD;  [facility];  Service:  Orthopedics;  Laterality: Right;  162min  . ROBOTIC ASSISTED TOTAL HYSTERECTOMY WITH BILATERAL SALPINGO OOPHERECTOMY Bilateral 11/11/2017   Procedure: XI ROBOTIC ASSISTED TOTAL HYSTERECTOMY WITH BILATERAL SALPINGO OOPHORECTOMY FOR UTERUS GREATER THAN 250 GRAMS;  Surgeon: Everitt Amber, MD;  [facility];  Service: Gynecology;  Laterality: Bilateral;  . TOTAL HIP ARTHROPLASTY  2008   Right    There were no vitals filed for this visit.  Subjective Assessment - 11/17/18 0955    Subjective   S: The ones laying down are a lot easier than the standing ones.    Currently in Pain?  Yes    Pain Score  2     Pain Location  Shoulder    Pain Orientation  Right    Pain Descriptors / Indicators  Aching;Constant    Pain Type  Surgical pain    Pain Radiating  Towards  N/A    Pain Onset  1 to 4 weeks ago    Pain Frequency  Constant    Aggravating Factors   being out of the sling more.    Pain Relieving Factors  pain medication if needed, relaxation techniques, ice machine    Effect of Pain on Daily Activities  severe effect due to shoulder protocol limiting her ability to use her arm.    Multiple Pain Sites  No         [facility] OT Assessment - 11/17/18 1000      Assessment   Medical Diagnosis  Right shoulder reverse total arthroplasty      Precautions   Precautions  Shoulder    Type of Shoulder Precautions  See protocol. No behind the back movement or extension of shoulder for 6 weeks.     Shoulder Interventions  Shoulder sling/immobilizer               OT Treatments/Exercises (OP) - 11/17/18 1000      Exercises   Exercises  Shoulder      Shoulder Exercises: Supine   Protraction  PROM;5 reps;AAROM;10 reps    Horizontal ABduction  PROM;5 reps;AAROM;10 reps    External Rotation  PROM;5 reps;AAROM;10 reps    Internal Rotation  PROM;5 reps;AAROM;10 reps    Flexion  PROM;5 reps;AAROM;10 reps    ABduction  PROM;5 reps;AAROM;10 reps      Shoulder Exercises: Standing   Protraction  AAROM;10 reps    Horizontal ABduction  AAROM;10 reps;Limitations    External Rotation  AAROM;10 reps    Internal Rotation  AAROM;10 reps    Flexion  AAROM;10 reps    ABduction  AAROM;10 reps      Shoulder Exercises: ROM/Strengthening   Wall Wash  1'      Manual Therapy   Manual Therapy  Myofascial release    Manual therapy comments  Manual therapy completed prior to exercises    Myofascial Release  Myofascial release and manual stretching completed to right upper arm, trapezius, and scapularis region to decrease fascial restrictions and increase joint mobility in a pain free zone.                OT Short Term Goals - 11/03/18 1007      OT SHORT TERM GOAL #1   Title  Patient will be educated and independent with HEP to faciliate  progress in therapy and allow her to return to using her RUE as her dominant extremity.    Time  4    Period  Weeks    Status  On-going  Target Date  11/29/18      OT SHORT TERM GOAL #2   Title  Patient will increase RUE P/ROM to [facility] in order to increase ability to complete UB dressing tasks with less difficulty.    Time  4    Period  Weeks    Status  On-going      OT SHORT TERM GOAL #3   Title  Patient will increase RUE strength to 3/5 in order to complete waist level activities with more strength and stability.    Time  4    Period  Weeks    Status  On-going      OT SHORT TERM GOAL #4   Title  Patient will decrease fascial restrictions in her RUE to mod amount or less in order to increase functional mobility needed to complete reaching tasks.    Time  4    Period  Weeks    Status  On-going      OT SHORT TERM GOAL #5   Title  Patient will decrease pain level to approximately 5/10 during functional daily tasks.    Time  4    Period  Weeks    Status  On-going        OT Long Term Goals - 11/03/18 1007      OT LONG TERM GOAL #1   Title  Patient will reach her highest level of independence while using her RUE as her dominant extremity for 75% or more tasks.    Time  8    Period  Weeks    Status  On-going      OT LONG TERM GOAL #2   Title  Patient will increase her A/ROM of her RUE to [facility] in order to complete overhead reaching tasks with increased comfort and ease.    Time  8    Period  Weeks    Status  On-going      OT LONG TERM GOAL #3   Title  Patient will increase RUE strength to 4/5 in order to complete normal household lifting tasks while using her RUE.    Time  8    Period  Weeks    Status  On-going      OT LONG TERM GOAL #4   Title  Patient will decrease her RUE fascial restrictions to min amount or less in order to increase her functional mobility needed when reaching into the overhead cabinets.    Time  8    Period  Weeks    Status  On-going      OT  LONG TERM GOAL #5   Title  Pt will report a decrease in pain of approximately 2/10 or less when completing daily tasks with her RUE.    Time  8    Period  Weeks    Status  On-going            Plan - 11/17/18 1028    Clinical Impression Statement  A: Able to reach all ROM recommendations except flexion which she achieved 90 degrees. Added wall wash this sessionto further work on shoulder strengthening and stability while off loading the weight. VC for form and technique.    Body Structure / Function / Physical Skills  ADL;ROM;Scar mobility;IADL;Edema;Fascial restriction;Mobility;Strength;Coordination;Decreased knowledge of use of DME;Decreased knowledge of precautions;UE functional use;Pain    Plan  P: Measure for MD appointment on Wednesday. Add PVC pipe slide.    Consulted and Agree with Plan of Care  Patient  Patient will benefit from skilled therapeutic intervention in order to improve the following deficits and impairments:   Body Structure / Function / Physical Skills: ADL, ROM, Scar mobility, IADL, Edema, Fascial restriction, Mobility, Strength, Coordination, Decreased knowledge of use of DME, Decreased knowledge of precautions, UE functional use, Pain       Visit Diagnosis: 1. Other symptoms and signs involving the musculoskeletal system   2. Acute pain of right shoulder   3. Stiffness of right shoulder, not elsewhere classified       Problem List Patient Active Problem List   Diagnosis Date Noted  . S/P reverse total shoulder arthroplasty, right 10/13/2018  . Fibroid uterus 11/11/2017  . Fibroids 11/03/2017  . Acute blood loss anemia 11/22/2015  . Colitis 11/22/2015  . UGI bleed   . Melena   . History of Helicobacter pylori infection   . Other fatigue   . Weakness   . Collagenous colitis 07/16/2011  . Chronic diarrhea 06/09/2011  . History of peptic ulcer disease 05/08/2011  . Personal history of colonic polyps 05/08/2011  . Family history of malignant  neoplasm of gastrointestinal tract 05/08/2011  . Other general symptoms  05/08/2011  . Iron deficiency anemia, unspecified  05/08/2011  . Diarrhea 05/08/2011  . Diverticulosis of colon (without mention of hemorrhage) 05/08/2011   Carol Wells, OTR/L,CBIS  [phone]  11/17/2018, 11:07 AM  [place], [place], [address] Phone: [phone]   Fax:  [phone]  Name: ANATALIA KRONK MRN: [id number] Date of Birth: Jan 10, 1932

## 2018-11-22 ENCOUNTER — Ambulatory Visit (HOSPITAL_COMMUNITY): Payer: Medicare Other

## 2018-11-22 ENCOUNTER — Other Ambulatory Visit: Payer: Self-pay

## 2018-11-22 DIAGNOSIS — M25611 Stiffness of right shoulder, not elsewhere classified: Secondary | ICD-10-CM | POA: Diagnosis not present

## 2018-11-22 DIAGNOSIS — M25511 Pain in right shoulder: Secondary | ICD-10-CM

## 2018-11-22 DIAGNOSIS — R29898 Other symptoms and signs involving the musculoskeletal system: Secondary | ICD-10-CM | POA: Diagnosis not present

## 2018-11-22 NOTE — Therapy (Signed)
[place], [place], [address] Phone: [phone]   Fax:  [phone]  Occupational Therapy Treatment  Patient Details  Name: Carol Wells MRN: [id number] Date of Birth: [birth year] Referring Provider (OT): Justice Britain, MD   Encounter Date: 11/22/2018  OT End of Session - 11/22/18 0953    Visit Number  7    Number of Visits  16    Date for OT Re-Evaluation  12/27/18   mini reassess: 11/29/18   Authorization Type  1) Medicare 80/20% 2) BCBS $10 copay with 30 visit limit. 0 used.    Authorization Time Period  see above    Authorization - Visit Number  7    Authorization - Number of Visits  30    OT Start Time  [id number]    OT Stop Time  1017    OT Time Calculation (min)  44 min    Activity Tolerance  Patient tolerated treatment well    Behavior During Therapy  WFL for tasks assessed/performed       Past Medical History:  Diagnosis Date  . Anemia 11/2015  . Arthritis   . Blood transfusion 2017 JULY 22  . Carotid stenosis    Dopplers 9/92: RICA 42-68%, LICA 3-41%  . Cataract    REMOVED  . Chronic headaches    HX MIGRAINES  . Collagenous colitis   . Diverticulosis 2013   Colonoscopy  . Gastric ulcer 11/2015  . Glaucoma    BOTH EYES  . History of spinal stenosis   . History of uterine fibroid   . Hx of cardiac catheterization    LHC 3/14: no angiographic CAD, EF 65%  . Hx of echocardiogram    Echocardiogram 06/21/12: Mild LVH, EF 96-22%, grade 2 diastolic dysfunction, mild MR  . Hyperlipidemia   . Hypertension   . Hypothyroidism   . Internal hemorrhoids 2009   Colonoscopy  . UTI (urinary tract infection) HX OF    Past Surgical History:  Procedure Laterality Date  . BUNIONECTOMY  YRS AGO LEFT   RIGHT CONE AT [facility] 3 YRS AGO  . BUNIONECTOMY Left   . CATARACT EXTRACTION W/ INTRAOCULAR LENS IMPLANT  03/2010   Left  . CATARACT EXTRACTION W/ INTRAOCULAR LENS IMPLANT Bilateral 11/2010  . COLONOSCOPY   2013   NORMAL   . ESOPHAGOGASTRODUODENOSCOPY N/A 11/23/2015   Procedure: ESOPHAGOGASTRODUODENOSCOPY (EGD);  Surgeon: Rogene Houston, MD;  [facility];  Service: Endoscopy;  Laterality: N/A;  . EUS N/A 12/19/2015   Procedure: ESOPHAGEAL ENDOSCOPIC ULTRASOUND (EUS) RADIAL;  Surgeon: Milus Banister, MD;  [facility];  Service: Endoscopy;  Laterality: N/A;  . FUNCTIONAL ENDOSCOPIC SINUS SURGERY    . HAND SURGERY Right 1998  . INGUINAL HERNIA REPAIR Left 03/11/2016   Procedure: LEFT INGUINAL HERINA REPAIR WITH MESH;  Surgeon: Donnie Mesa, MD;  Location: [facility];  Service: General;  Laterality: Left;  . INSERTION OF MESH Left 03/11/2016   Procedure: INSERTION OF MESH;  Surgeon: Donnie Mesa, MD;  Location: [facility];  Service: General;  Laterality: Left;  . IR RADIOLOGIST EVAL & MGMT  07/01/2017  . LAMINECTOMY  03/2008  . LAPAROTOMY N/A 11/11/2017   Procedure: MINI LAPAROTOMY;  Surgeon: Everitt Amber, MD;  [facility];  Service: Gynecology;  Laterality: N/A;  . POLYPECTOMY    . REVERSE SHOULDER ARTHROPLASTY Right 10/13/2018   Procedure: REVERSE SHOULDER ARTHROPLASTY;  Surgeon: Justice Britain, MD;  [facility];  Service:  Orthopedics;  Laterality: Right;  133min  . ROBOTIC ASSISTED TOTAL HYSTERECTOMY WITH BILATERAL SALPINGO OOPHERECTOMY Bilateral 11/11/2017   Procedure: XI ROBOTIC ASSISTED TOTAL HYSTERECTOMY WITH BILATERAL SALPINGO OOPHORECTOMY FOR UTERUS GREATER THAN 250 GRAMS;  Surgeon: Everitt Amber, MD;  [facility];  Service: Gynecology;  Laterality: Bilateral;  . TOTAL HIP ARTHROPLASTY  2008   Right    There were no vitals filed for this visit.  Subjective Assessment - 11/22/18 0951    Subjective   S: I was able to hold the leaf blower with my left hand.    Currently in Pain?  Yes    Pain Score  2     Pain Location  Shoulder    Pain Orientation  Right    Pain Descriptors / Indicators  Aching;Constant    Pain Type  Surgical pain         [facility] OT Assessment  - 11/22/18 0935      Assessment   Medical Diagnosis  Right shoulder reverse total arthroplasty      Precautions   Precautions  Shoulder    Type of Shoulder Precautions  See protocol. No behind the back movement or extension of shoulder for 6 weeks.     Shoulder Interventions  Shoulder sling/immobilizer      ROM / Strength   AROM / PROM / Strength  PROM;Strength;AROM      AROM   Overall AROM   Unable to assess;Due to precautions      PROM   Overall PROM Comments  Assessed supine. IR/er adducted   Following ROM protocol   PROM Assessment Site  Shoulder    Right/Left Shoulder  Right    Right Shoulder Flexion  115 Degrees   previous: 50   Right Shoulder ABduction  90 Degrees   previous: 60   Right Shoulder Internal Rotation  90 Degrees   previous: same   Right Shoulder External Rotation  35 Degrees   previous: 15     Strength   Overall Strength  Unable to assess;Due to precautions               OT Treatments/Exercises (OP) - 11/22/18 0935      Exercises   Exercises  Shoulder      Shoulder Exercises: Supine   Protraction  PROM;5 reps;AAROM;10 reps    Horizontal ABduction  PROM;5 reps;AAROM;10 reps    External Rotation  PROM;5 reps;AAROM;10 reps    Internal Rotation  PROM;5 reps;AAROM;10 reps    Flexion  PROM;5 reps;AAROM;10 reps    ABduction  PROM;5 reps;AAROM;10 reps      Shoulder Exercises: Standing   Protraction  AAROM;10 reps    Horizontal ABduction  AAROM;10 reps;Limitations    Horizontal ABduction Limitations  Not at shoulder level    External Rotation  AAROM;10 reps    Internal Rotation  AAROM;10 reps    Flexion  AAROM;10 reps    ABduction  AAROM;10 reps      Shoulder Exercises: ROM/Strengthening   Wall Wash  1'    Other ROM/Strengthening Exercises  PVC pipe slide; 10X      Manual Therapy   Manual Therapy  Myofascial release    Manual therapy comments  Manual therapy completed prior to exercises               OT Short Term Goals -  11/03/18 1007      OT SHORT TERM GOAL #1   Title  Patient will be educated and independent with HEP  to faciliate progress in therapy and allow her to return to using her RUE as her dominant extremity.    Time  4    Period  Weeks    Status  On-going    Target Date  11/29/18      OT SHORT TERM GOAL #2   Title  Patient will increase RUE P/ROM to [facility] in order to increase ability to complete UB dressing tasks with less difficulty.    Time  4    Period  Weeks    Status  On-going      OT SHORT TERM GOAL #3   Title  Patient will increase RUE strength to 3/5 in order to complete waist level activities with more strength and stability.    Time  4    Period  Weeks    Status  On-going      OT SHORT TERM GOAL #4   Title  Patient will decrease fascial restrictions in her RUE to mod amount or less in order to increase functional mobility needed to complete reaching tasks.    Time  4    Period  Weeks    Status  On-going      OT SHORT TERM GOAL #5   Title  Patient will decrease pain level to approximately 5/10 during functional daily tasks.    Time  4    Period  Weeks    Status  On-going        OT Long Term Goals - 11/03/18 1007      OT LONG TERM GOAL #1   Title  Patient will reach her highest level of independence while using her RUE as her dominant extremity for 75% or more tasks.    Time  8    Period  Weeks    Status  On-going      OT LONG TERM GOAL #2   Title  Patient will increase her A/ROM of her RUE to [facility] in order to complete overhead reaching tasks with increased comfort and ease.    Time  8    Period  Weeks    Status  On-going      OT LONG TERM GOAL #3   Title  Patient will increase RUE strength to 4/5 in order to complete normal household lifting tasks while using her RUE.    Time  8    Period  Weeks    Status  On-going      OT LONG TERM GOAL #4   Title  Patient will decrease her RUE fascial restrictions to min amount or less in order to increase her functional  mobility needed when reaching into the overhead cabinets.    Time  8    Period  Weeks    Status  On-going      OT LONG TERM GOAL #5   Title  Pt will report a decrease in pain of approximately 2/10 or less when completing daily tasks with her RUE.    Time  8    Period  Weeks    Status  On-going            Plan - 11/22/18 9381    Clinical Impression Statement  A: Measurements taken this date for MD appointment tomorrow. Pt provided with copy in addition to MD being sent them via EPIC. Minimal fascial restrictions noted in the right UE. Manual techniques were completed to address. Added PVC pipe slide to further increase shoulder ROM and progress functional reaching. VC  for form and technique.    Body Structure / Function / Physical Skills  ADL;ROM;Scar mobility;IADL;Edema;Fascial restriction;Mobility;Strength;Coordination;Decreased knowledge of use of DME;Decreased knowledge of precautions;UE functional use;Pain    Plan  P: Follow up on MD appointment. Increase repetitions to 12 for AA/ROM. Resume pulleys    Consulted and Agree with Plan of Care  Patient       Patient will benefit from skilled therapeutic intervention in order to improve the following deficits and impairments:   Body Structure / Function / Physical Skills: ADL, ROM, Scar mobility, IADL, Edema, Fascial restriction, Mobility, Strength, Coordination, Decreased knowledge of use of DME, Decreased knowledge of precautions, UE functional use, Pain       Visit Diagnosis: 1. Other symptoms and signs involving the musculoskeletal system   2. Acute pain of right shoulder   3. Stiffness of right shoulder, not elsewhere classified       Problem List Patient Active Problem List   Diagnosis Date Noted  . S/P reverse total shoulder arthroplasty, right 10/13/2018  . Fibroid uterus 11/11/2017  . Fibroids 11/03/2017  . Acute blood loss anemia 11/22/2015  . Colitis 11/22/2015  . UGI bleed   . Melena   . History of  Helicobacter pylori infection   . Other fatigue   . Weakness   . Collagenous colitis 07/16/2011  . Chronic diarrhea 06/09/2011  . History of peptic ulcer disease 05/08/2011  . Personal history of colonic polyps 05/08/2011  . Family history of malignant neoplasm of gastrointestinal tract 05/08/2011  . Other general symptoms  05/08/2011  . Iron deficiency anemia, unspecified  05/08/2011  . Diarrhea 05/08/2011  . Diverticulosis of colon (without mention of hemorrhage) 05/08/2011   Ailene Ravel, OTR/L,CBIS  [phone]  11/22/2018, 10:30 AM  [place], [place], [address] Phone: [phone]   Fax:  [phone]  Name: Carol Wells MRN: [id number] Date of Birth: [birth year]

## 2018-11-24 ENCOUNTER — Ambulatory Visit (HOSPITAL_COMMUNITY): Payer: Medicare Other

## 2018-11-24 ENCOUNTER — Encounter (HOSPITAL_COMMUNITY): Payer: Self-pay

## 2018-11-24 ENCOUNTER — Other Ambulatory Visit: Payer: Self-pay

## 2018-11-24 DIAGNOSIS — M25611 Stiffness of right shoulder, not elsewhere classified: Secondary | ICD-10-CM | POA: Diagnosis not present

## 2018-11-24 DIAGNOSIS — M25511 Pain in right shoulder: Secondary | ICD-10-CM | POA: Diagnosis not present

## 2018-11-24 DIAGNOSIS — R29898 Other symptoms and signs involving the musculoskeletal system: Secondary | ICD-10-CM | POA: Diagnosis not present

## 2018-11-24 NOTE — Therapy (Signed)
[place], [place], [address] Phone: [phone]   Fax:  [phone]  Occupational Therapy Treatment  Patient Details  Name: Carol Wells MRN: [id number] Date of Birth: [birth year] Referring Provider (OT): Justice Britain, MD   Encounter Date: 11/24/2018  OT End of Session - 11/24/18 1257    Visit Number  8    Number of Visits  16    Date for OT Re-Evaluation  12/27/18   mini reassess: 11/29/18   Authorization Type  1) Medicare 80/20% 2) BC[employer] $10 copay with 30 visit limit. 0 used.    Authorization Time Period  see above    Authorization - Visit Number  8    Authorization - Number of Visits  30    OT Start Time  6962    OT Stop Time  1018    OT Time Calculation (min)  43 min    Activity Tolerance  Patient tolerated treatment well    Behavior During Therapy  WFL for tasks assessed/performed       Past Medical History:  Diagnosis Date  . Anemia 11/2015  . Arthritis   . Blood transfusion 2017 JULY 22  . Carotid stenosis    Dopplers 9/52: RICA 84-13%, LICA 2-44%  . Cataract    REMOVED  . Chronic headaches    HX MIGRAINES  . Collagenous colitis   . Diverticulosis 2013   Colonoscopy  . Gastric ulcer 11/2015  . Glaucoma    BOTH EYES  . History of spinal stenosis   . History of uterine fibroid   . Hx of cardiac catheterization    LHC 3/14: no angiographic CAD, EF 65%  . Hx of echocardiogram    Echocardiogram 06/21/12: Mild LVH, EF 01-02%, grade 2 diastolic dysfunction, mild MR  . Hyperlipidemia   . Hypertension   . Hypothyroidism   . Internal hemorrhoids 2009   Colonoscopy  . UTI (urinary tract infection) HX OF    Past Surgical History:  Procedure Laterality Date  . BUNIONECTOMY  YRS AGO LEFT   RIGHT CONE AT [facility] 3 YRS AGO  . BUNIONECTOMY Left   . CATARACT EXTRACTION W/ INTRAOCULAR LENS IMPLANT  03/2010   Left  . CATARACT EXTRACTION W/ INTRAOCULAR LENS IMPLANT Bilateral 11/2010  . COLONOSCOPY   2013   NORMAL   . ESOPHAGOGASTRODUODENOSCOPY N/A 11/23/2015   Procedure: ESOPHAGOGASTRODUODENOSCOPY (EGD);  Surgeon: Rogene Houston, MD;  [facility];  Service: Endoscopy;  Laterality: N/A;  . EUS N/A 12/19/2015   Procedure: ESOPHAGEAL ENDOSCOPIC ULTRASOUND (EUS) RADIAL;  Surgeon: Milus Banister, MD;  [facility];  Service: Endoscopy;  Laterality: N/A;  . FUNCTIONAL ENDOSCOPIC SINUS SURGERY    . HAND SURGERY Right 1998  . INGUINAL HERNIA REPAIR Left 03/11/2016   Procedure: LEFT INGUINAL HERINA REPAIR WITH MESH;  Surgeon: Donnie Mesa, MD;  Location: [facility];  Service: General;  Laterality: Left;  . INSERTION OF MESH Left 03/11/2016   Procedure: INSERTION OF MESH;  Surgeon: Donnie Mesa, MD;  Location: [place];  Service: General;  Laterality: Left;  . IR RADIOLOGIST EVAL & MGMT  07/01/2017  . LAMINECTOMY  03/2008  . LAPAROTOMY N/A 11/11/2017   Procedure: MINI LAPAROTOMY;  Surgeon: Everitt Amber, MD;  [facility];  Service: Gynecology;  Laterality: N/A;  . POLYPECTOMY    . REVERSE SHOULDER ARTHROPLASTY Right 10/13/2018   Procedure: REVERSE SHOULDER ARTHROPLASTY;  Surgeon: Justice Britain, MD;  [facility];  Service:  Orthopedics;  Laterality: Right;  159min  . ROBOTIC ASSISTED TOTAL HYSTERECTOMY WITH BILATERAL SALPINGO OOPHERECTOMY Bilateral 11/11/2017   Procedure: XI ROBOTIC ASSISTED TOTAL HYSTERECTOMY WITH BILATERAL SALPINGO OOPHORECTOMY FOR UTERUS GREATER THAN 250 GRAMS;  Surgeon: Everitt Amber, MD;  [facility];  Service: Gynecology;  Laterality: Bilateral;  . TOTAL HIP ARTHROPLASTY  2008   Right    There were no vitals filed for this visit.  Subjective Assessment - 11/24/18 0936    Subjective   S: The MD said I don't have any ROM limitations at this point and to not be afraid.    Currently in Pain?  Yes    Pain Score  2     Pain Location  Shoulder    Pain Orientation  Right    Pain Descriptors / Indicators  Aching;Constant    Pain Type  Surgical pain     Pain Radiating Towards  N/A    Pain Onset  1 to 4 weeks ago    Aggravating Factors   Increased use and movement    Pain Relieving Factors  pain medication if needed. relaxation techniques, ice machine    Effect of Pain on Daily Activities  Max effect    Multiple Pain Sites  No         [facility] OT Assessment - 11/24/18 0956      Assessment   Medical Diagnosis  Right shoulder reverse total arthroplasty      Precautions   Precautions  Shoulder    Type of Shoulder Precautions  See protocol. 7/23: At MD appointment: No ROM limitations.                OT Treatments/Exercises (OP) - 11/24/18 0956      Exercises   Exercises  Shoulder      Shoulder Exercises: Supine   Protraction  PROM;5 reps;AAROM;12 reps    Horizontal ABduction  PROM;5 reps;AAROM;12 reps    External Rotation  PROM;5 reps;AAROM;12 reps    Internal Rotation  PROM;5 reps;AAROM;12 reps    Flexion  PROM;5 reps;AAROM;12 reps    ABduction  PROM;5 reps;AAROM;12 reps      Shoulder Exercises: Standing   Protraction  AAROM;12 reps    Horizontal ABduction  AAROM;12 reps    External Rotation  AAROM;12 reps    Internal Rotation  AAROM;12 reps    Flexion  AAROM;12 reps    ABduction  AAROM;12 reps      Shoulder Exercises: ROM/Strengthening   Wall Wash  1'    Proximal Shoulder Strengthening, Supine  10X A/ROM no rest breaks      Manual Therapy   Manual Therapy  Myofascial release    Manual therapy comments  Manual therapy completed prior to exercises    Myofascial Release  Myofascial release and manual stretching completed to right upper arm, trapezius, and scapularis region to decrease fascial restrictions and increase joint mobility in a pain free zone.                OT Short Term Goals - 11/03/18 1007      OT SHORT TERM GOAL #1   Title  Patient will be educated and independent with HEP to faciliate progress in therapy and allow her to return to using her RUE as her dominant extremity.    Time  4     Period  Weeks    Status  On-going    Target Date  11/29/18      OT SHORT TERM GOAL #2  Title  Patient will increase RUE P/ROM to [facility] in order to increase ability to complete UB dressing tasks with less difficulty.    Time  4    Period  Weeks    Status  On-going      OT SHORT TERM GOAL #3   Title  Patient will increase RUE strength to 3/5 in order to complete waist level activities with more strength and stability.    Time  4    Period  Weeks    Status  On-going      OT SHORT TERM GOAL #4   Title  Patient will decrease fascial restrictions in her RUE to mod amount or less in order to increase functional mobility needed to complete reaching tasks.    Time  4    Period  Weeks    Status  On-going      OT SHORT TERM GOAL #5   Title  Patient will decrease pain level to approximately 5/10 during functional daily tasks.    Time  4    Period  Weeks    Status  On-going        OT Long Term Goals - 11/03/18 1007      OT LONG TERM GOAL #1   Title  Patient will reach her highest level of independence while using her RUE as her dominant extremity for 75% or more tasks.    Time  8    Period  Weeks    Status  On-going      OT LONG TERM GOAL #2   Title  Patient will increase her A/ROM of her RUE to [facility] in order to complete overhead reaching tasks with increased comfort and ease.    Time  8    Period  Weeks    Status  On-going      OT LONG TERM GOAL #3   Title  Patient will increase RUE strength to 4/5 in order to complete normal household lifting tasks while using her RUE.    Time  8    Period  Weeks    Status  On-going      OT LONG TERM GOAL #4   Title  Patient will decrease her RUE fascial restrictions to min amount or less in order to increase her functional mobility needed when reaching into the overhead cabinets.    Time  8    Period  Weeks    Status  On-going      OT LONG TERM GOAL #5   Title  Pt will report a decrease in pain of approximately 2/10 or less when  completing daily tasks with her RUE.    Time  8    Period  Weeks    Status  On-going            Plan - 11/24/18 1257    Clinical Impression Statement  A: Pt reports that MD is pleased with her progress. He states that she no longer has any ROM restrictions. Progressed to 12 repetitions for AA/ROM exercises supine and standing. Unable to complete all exercises due to time constraint. Patient was able to complete standing horizontal abduction at shoulder level this date. VC for form and technique.    Body Structure / Function / Physical Skills  ADL;ROM;Scar mobility;IADL;Edema;Fascial restriction;Mobility;Strength;Coordination;Decreased knowledge of use of DME;Decreased knowledge of precautions;UE functional use;Pain    Plan  P: Continue to work on increasing ROM during passive stretching. Add muscle energy technique. Resume pulleys and PVC pipe  slide. Complete FOTO. Progress note for medicare.    Consulted and Agree with Plan of Care  Patient       Patient will benefit from skilled therapeutic intervention in order to improve the following deficits and impairments:   Body Structure / Function / Physical Skills: ADL, ROM, Scar mobility, IADL, Edema, Fascial restriction, Mobility, Strength, Coordination, Decreased knowledge of use of DME, Decreased knowledge of precautions, UE functional use, Pain       Visit Diagnosis: 1. Stiffness of right shoulder, not elsewhere classified   2. Other symptoms and signs involving the musculoskeletal system   3. Acute pain of right shoulder       Problem List Patient Active Problem List   Diagnosis Date Noted  . S/P reverse total shoulder arthroplasty, right 10/13/2018  . Fibroid uterus 11/11/2017  . Fibroids 11/03/2017  . Acute blood loss anemia 11/22/2015  . Colitis 11/22/2015  . UGI bleed   . Melena   . History of Helicobacter pylori infection   . Other fatigue   . Weakness   . Collagenous colitis 07/16/2011  . Chronic diarrhea  06/09/2011  . History of peptic ulcer disease 05/08/2011  . Personal history of colonic polyps 05/08/2011  . Family history of malignant neoplasm of gastrointestinal tract 05/08/2011  . Other general symptoms  05/08/2011  . Iron deficiency anemia, unspecified  05/08/2011  . Diarrhea 05/08/2011  . Diverticulosis of colon (without mention of hemorrhage) 05/08/2011   Ailene Ravel, OTR/L,CBIS  [phone]  11/24/2018, 1:00 PM  [place], [place], [address] Phone: [phone]   Fax:  [phone]  Name: ARACELY RICKETT MRN: [id number] Date of Birth: [birth year]

## 2018-11-29 ENCOUNTER — Ambulatory Visit (HOSPITAL_COMMUNITY): Payer: Medicare Other

## 2018-11-29 ENCOUNTER — Other Ambulatory Visit: Payer: Self-pay

## 2018-11-29 ENCOUNTER — Encounter (HOSPITAL_COMMUNITY): Payer: Self-pay

## 2018-11-29 DIAGNOSIS — R29898 Other symptoms and signs involving the musculoskeletal system: Secondary | ICD-10-CM | POA: Diagnosis not present

## 2018-11-29 DIAGNOSIS — M25611 Stiffness of right shoulder, not elsewhere classified: Secondary | ICD-10-CM

## 2018-11-29 DIAGNOSIS — M25511 Pain in right shoulder: Secondary | ICD-10-CM

## 2018-11-29 NOTE — Therapy (Signed)
[facility] [address] [place], [place], [address] Phone: [phone]   Fax:  [phone]  Occupational Therapy Treatment  Patient Details  Name: Carol Wells MRN: [id number] Date of Birth: 1931-08-05 Referring Provider (OT): Justice Britain, MD  Progress Note Reporting Period 11/01/2018 to 11/29/2018  See note below for Objective Data and Assessment of Progress/Goals.       Encounter Date: 11/29/2018  OT End of Session - 11/29/18 1048    Visit Number  9    Number of Visits  16    Date for OT Re-Evaluation  12/27/18   mini reassess: 11/29/18   Authorization Type  1) Medicare 80/20% 2) BC[employer] $10 copay with 30 visit limit. 0 used.    Authorization Time Period  see above    Authorization - Visit Number  9   medicare: 0   Authorization - Number of Visits  30   medicare: 10   OT Start Time  0940    OT Stop Time  1017    OT Time Calculation (min)  37 min    Activity Tolerance  Patient tolerated treatment well    Behavior During Therapy  WFL for tasks assessed/performed       Past Medical History:  Diagnosis Date  . Anemia 11/2015  . Arthritis   . Blood transfusion 2017 JULY 22  . Carotid stenosis    Dopplers 0/24: RICA 09-73%, LICA 5-32%  . Cataract    REMOVED  . Chronic headaches    HX MIGRAINES  . Collagenous colitis   . Diverticulosis 2013   Colonoscopy  . Gastric ulcer 11/2015  . Glaucoma    BOTH EYES  . History of spinal stenosis   . History of uterine fibroid   . Hx of cardiac catheterization    LHC 3/14: no angiographic CAD, EF 65%  . Hx of echocardiogram    Echocardiogram 06/21/12: Mild LVH, EF 99-24%, grade 2 diastolic dysfunction, mild MR  . Hyperlipidemia   . Hypertension   . Hypothyroidism   . Internal hemorrhoids 2009   Colonoscopy  . UTI (urinary tract infection) HX OF    Past Surgical History:  Procedure Laterality Date  . BUNIONECTOMY  YRS AGO LEFT   RIGHT CONE AT [facility] 3 YRS AGO  . BUNIONECTOMY  Left   . CATARACT EXTRACTION W/ INTRAOCULAR LENS IMPLANT  03/2010   Left  . CATARACT EXTRACTION W/ INTRAOCULAR LENS IMPLANT Bilateral 11/2010  . COLONOSCOPY  2013   NORMAL   . ESOPHAGOGASTRODUODENOSCOPY N/A 11/23/2015   Procedure: ESOPHAGOGASTRODUODENOSCOPY (EGD);  Surgeon: Rogene Houston, MD;  [facility];  Service: Endoscopy;  Laterality: N/A;  . EUS N/A 12/19/2015   Procedure: ESOPHAGEAL ENDOSCOPIC ULTRASOUND (EUS) RADIAL;  Surgeon: Milus Banister, MD;  [facility];  Service: Endoscopy;  Laterality: N/A;  . FUNCTIONAL ENDOSCOPIC SINUS SURGERY    . HAND SURGERY Right 1998  . INGUINAL HERNIA REPAIR Left 03/11/2016   Procedure: LEFT INGUINAL HERINA REPAIR WITH MESH;  Surgeon: Donnie Mesa, MD;  Location: [facility];  Service: General;  Laterality: Left;  . INSERTION OF MESH Left 03/11/2016   Procedure: INSERTION OF MESH;  Surgeon: Donnie Mesa, MD;  Location: [facility];  Service: General;  Laterality: Left;  . IR RADIOLOGIST EVAL & MGMT  07/01/2017  . LAMINECTOMY  03/2008  . LAPAROTOMY N/A 11/11/2017   Procedure: MINI LAPAROTOMY;  Surgeon: Everitt Amber, MD;  [facility];  Service: Gynecology;  Laterality: N/A;  .  POLYPECTOMY    . REVERSE SHOULDER ARTHROPLASTY Right 10/13/2018   Procedure: REVERSE SHOULDER ARTHROPLASTY;  Surgeon: Justice Britain, MD;  [facility];  Service: Orthopedics;  Laterality: Right;  122min  . ROBOTIC ASSISTED TOTAL HYSTERECTOMY WITH BILATERAL SALPINGO OOPHERECTOMY Bilateral 11/11/2017   Procedure: XI ROBOTIC ASSISTED TOTAL HYSTERECTOMY WITH BILATERAL SALPINGO OOPHORECTOMY FOR UTERUS GREATER THAN 250 GRAMS;  Surgeon: Everitt Amber, MD;  [facility];  Service: Gynecology;  Laterality: Bilateral;  . TOTAL HIP ARTHROPLASTY  2008   Right    There were no vitals filed for this visit.  Subjective Assessment - 11/29/18 0955    Currently in Pain?  Yes    Pain Score  3     Pain Location  Shoulder    Pain Orientation  Right    Pain Descriptors /  Indicators  Sore    Pain Type  Acute pain    Pain Radiating Towards  N/A    Pain Onset  Today    Pain Frequency  Constant    Aggravating Factors   increased movement and use    Pain Relieving Factors  pain medication if needed. relaxation techniques, ice machine - using less    Effect of Pain on Daily Activities  mod effect    Multiple Pain Sites  No         [facility] OT Assessment - 11/29/18 0958      Assessment   Medical Diagnosis  Right shoulder reverse total arthroplasty      Precautions   Precautions  Shoulder    Type of Shoulder Precautions  See protocol. 7/23: At MD appointment: No ROM limitations.       Observation/Other Assessments   Focus on Therapeutic Outcomes (FOTO)   55/100               OT Treatments/Exercises (OP) - 11/29/18 0958      Exercises   Exercises  Shoulder      Shoulder Exercises: Supine   Protraction  PROM;5 reps    Horizontal ABduction  PROM;5 reps    External Rotation  PROM;5 reps    Internal Rotation  PROM;5 reps    Flexion  PROM;5 reps    ABduction  PROM;5 reps      Shoulder Exercises: Standing   Protraction  AAROM;12 reps    Horizontal ABduction  AAROM;12 reps    External Rotation  AAROM;12 reps    Internal Rotation  AAROM;12 reps    Flexion  AAROM;12 reps    ABduction  AAROM;12 reps    Row  Theraband;10 reps    Theraband Level (Shoulder Row)  Level 2 (Red)      Shoulder Exercises: Pulleys   Flexion  1 minute    ABduction  1 minute      Shoulder Exercises: ROM/Strengthening   Wall Wash  1'    Proximal Shoulder Strengthening, Supine  10X A/ROM no rest breaks    Proximal Shoulder Strengthening, Seated  10X with A/ROM, no rest breaks    Other ROM/Strengthening Exercises  PVC pipe slide; 10X      Manual Therapy   Manual Therapy  Myofascial release;Muscle Energy Technique    Manual therapy comments  Manual therapy completed prior to exercises    Myofascial Release  Myofascial release and manual stretching completed to right  upper arm, trapezius, and scapularis region to decrease fascial restrictions and increase joint mobility in a pain free zone.     Muscle Energy Technique  Muscle energy technique  completed to right arm to decrease tone and increase ROM and joint mobility.                OT Short Term Goals - 11/03/18 1007      OT SHORT TERM GOAL #1   Title  Patient will be educated and independent with HEP to faciliate progress in therapy and allow her to return to using her RUE as her dominant extremity.    Time  4    Period  Weeks    Status  On-going    Target Date  11/29/18      OT SHORT TERM GOAL #2   Title  Patient will increase RUE P/ROM to [facility] in order to increase ability to complete UB dressing tasks with less difficulty.    Time  4    Period  Weeks    Status  On-going      OT SHORT TERM GOAL #3   Title  Patient will increase RUE strength to 3/5 in order to complete waist level activities with more strength and stability.    Time  4    Period  Weeks    Status  On-going      OT SHORT TERM GOAL #4   Title  Patient will decrease fascial restrictions in her RUE to mod amount or less in order to increase functional mobility needed to complete reaching tasks.    Time  4    Period  Weeks    Status  On-going      OT SHORT TERM GOAL #5   Title  Patient will decrease pain level to approximately 5/10 during functional daily tasks.    Time  4    Period  Weeks    Status  On-going        OT Long Term Goals - 11/03/18 1007      OT LONG TERM GOAL #1   Title  Patient will reach her highest level of independence while using her RUE as her dominant extremity for 75% or more tasks.    Time  8    Period  Weeks    Status  On-going      OT LONG TERM GOAL #2   Title  Patient will increase her A/ROM of her RUE to [facility] in order to complete overhead reaching tasks with increased comfort and ease.    Time  8    Period  Weeks    Status  On-going      OT LONG TERM GOAL #3   Title  Patient  will increase RUE strength to 4/5 in order to complete normal household lifting tasks while using her RUE.    Time  8    Period  Weeks    Status  On-going      OT LONG TERM GOAL #4   Title  Patient will decrease her RUE fascial restrictions to min amount or less in order to increase her functional mobility needed when reaching into the overhead cabinets.    Time  8    Period  Weeks    Status  On-going      OT LONG TERM GOAL #5   Title  Pt will report a decrease in pain of approximately 2/10 or less when completing daily tasks with her RUE.    Time  8    Period  Weeks    Status  On-going            Plan - 11/29/18 4818  Clinical Impression Statement  A: Pt reports that she is attempting to feed herself with her right hand at home. Patient reports that is able to wash her cheeks on both sides of her face with her right hand. She continues to have difficulty with reaching above shoulder level. She is unable to place food containers in the microwave at this point.    Body Structure / Function / Physical Skills  ADL;ROM;Scar mobility;IADL;Edema;Fascial restriction;Mobility;Strength;Coordination;Decreased knowledge of use of DME;Decreased knowledge of precautions;UE functional use;Pain    Plan  P: Continue with muscle energy technique to work on increasing ROM during passive stretching. Add remainder of scapular strengthening with red band and add to HEP.    Consulted and Agree with Plan of Care  Patient       Patient will benefit from skilled therapeutic intervention in order to improve the following deficits and impairments:   Body Structure / Function / Physical Skills: ADL, ROM, Scar mobility, IADL, Edema, Fascial restriction, Mobility, Strength, Coordination, Decreased knowledge of use of DME, Decreased knowledge of precautions, UE functional use, Pain       Visit Diagnosis: 1. Stiffness of right shoulder, not elsewhere classified   2. Other symptoms and signs involving the  musculoskeletal system   3. Acute pain of right shoulder       Problem List Patient Active Problem List   Diagnosis Date Noted  . S/P reverse total shoulder arthroplasty, right 10/13/2018  . Fibroid uterus 11/11/2017  . Fibroids 11/03/2017  . Acute blood loss anemia 11/22/2015  . Colitis 11/22/2015  . UGI bleed   . Melena   . History of Helicobacter pylori infection   . Other fatigue   . Weakness   . Collagenous colitis 07/16/2011  . Chronic diarrhea 06/09/2011  . History of peptic ulcer disease 05/08/2011  . Personal history of colonic polyps 05/08/2011  . Family history of malignant neoplasm of gastrointestinal tract 05/08/2011  . Other general symptoms  05/08/2011  . Iron deficiency anemia, unspecified  05/08/2011  . Diarrhea 05/08/2011  . Diverticulosis of colon (without mention of hemorrhage) 05/08/2011   Ailene Ravel, OTR/L,CBIS  [phone]  11/29/2018, 10:54 AM  [facility] [place], [place], [address] Phone: [phone]   Fax:  [phone]  Name: FINNLEE GUARNIERI MRN: [id number] Date of Birth: 1932-01-18

## 2018-12-01 ENCOUNTER — Other Ambulatory Visit: Payer: Self-pay

## 2018-12-01 ENCOUNTER — Encounter (HOSPITAL_COMMUNITY): Payer: Self-pay

## 2018-12-01 ENCOUNTER — Ambulatory Visit (HOSPITAL_COMMUNITY): Payer: Medicare Other

## 2018-12-01 DIAGNOSIS — M25611 Stiffness of right shoulder, not elsewhere classified: Secondary | ICD-10-CM | POA: Diagnosis not present

## 2018-12-01 DIAGNOSIS — R29898 Other symptoms and signs involving the musculoskeletal system: Secondary | ICD-10-CM | POA: Diagnosis not present

## 2018-12-01 DIAGNOSIS — M25511 Pain in right shoulder: Secondary | ICD-10-CM | POA: Diagnosis not present

## 2018-12-01 NOTE — Patient Instructions (Signed)

## 2018-12-01 NOTE — Therapy (Signed)
[facility] [place], [place], [address] Phone: [phone]   Fax:  [phone]  Occupational Therapy Treatment  Patient Details  Name: Carol Wells MRN: [id number] Date of Birth: 05/30/1931 Referring Provider (OT): Justice Britain, MD   Encounter Date: 12/01/2018  OT End of Session - 12/01/18 0959    Visit Number  10    Number of Visits  16    Date for OT Re-Evaluation  12/27/18   mini reassess: 11/29/18   Authorization Type  1) Medicare 80/20% 2) [employer] $10 copay with 30 visit limit. 0 used.    Authorization Time Period  see above    Authorization - Visit Number  10   medicare: 1   Authorization - Number of Visits  30   medicare: 10   OT Start Time  1093    OT Stop Time  1015    OT Time Calculation (min)  40 min    Activity Tolerance  Patient tolerated treatment well    Behavior During Therapy  WFL for tasks assessed/performed       Past Medical History:  Diagnosis Date  . Anemia 11/2015  . Arthritis   . Blood transfusion 2017 JULY 22  . Carotid stenosis    Dopplers 2/35: RICA 57-32%, LICA 2-02%  . Cataract    REMOVED  . Chronic headaches    HX MIGRAINES  . Collagenous colitis   . Diverticulosis 2013   Colonoscopy  . Gastric ulcer 11/2015  . Glaucoma    BOTH EYES  . History of spinal stenosis   . History of uterine fibroid   . Hx of cardiac catheterization    LHC 3/14: no angiographic CAD, EF 65%  . Hx of echocardiogram    Echocardiogram 06/21/12: Mild LVH, EF 54-27%, grade 2 diastolic dysfunction, mild MR  . Hyperlipidemia   . Hypertension   . Hypothyroidism   . Internal hemorrhoids 2009   Colonoscopy  . UTI (urinary tract infection) HX OF    Past Surgical History:  Procedure Laterality Date  . BUNIONECTOMY  YRS AGO LEFT   RIGHT [facility]E AT [facility] 3 YRS AGO  . BUNIONECTOMY Left   . CATARACT EXTRACTION W/ INTRAOCULAR LENS IMPLANT  03/2010   Left  . CATARACT EXTRACTION W/ INTRAOCULAR LENS IMPLANT  Bilateral 11/2010  . COLONOSCOPY  2013   NORMAL   . ESOPHAGOGASTRODUODENOSCOPY N/A 11/23/2015   Procedure: ESOPHAGOGASTRODUODENOSCOPY (EGD);  Surgeon: Rogene Houston, MD;  [facility];  Service: Endoscopy;  Laterality: N/A;  . EUS N/A 12/19/2015   Procedure: ESOPHAGEAL ENDOSCOPIC ULTRASOUND (EUS) RADIAL;  Surgeon: Milus Banister, MD;  [facility];  Service: Endoscopy;  Laterality: N/A;  . FUNCTIONAL ENDOSCOPIC SINUS SURGERY    . HAND SURGERY Right 1998  . INGUINAL HERNIA REPAIR Left 03/11/2016   Procedure: LEFT INGUINAL HERINA REPAIR WITH MESH;  Surgeon: Donnie Mesa, MD;  Location: [facility];  Service: General;  Laterality: Left;  . INSERTION OF MESH Left 03/11/2016   Procedure: INSERTION OF MESH;  Surgeon: Donnie Mesa, MD;  Location: [facility];  Service: General;  Laterality: Left;  . IR RADIOLOGIST EVAL & MGMT  07/01/2017  . LAMINECTOMY  03/2008  . LAPAROTOMY N/A 11/11/2017   Procedure: MINI LAPAROTOMY;  Surgeon: Everitt Amber, MD;  [facility];  Service: Gynecology;  Laterality: N/A;  . POLYPECTOMY    . REVERSE SHOULDER ARTHROPLASTY Right 10/13/2018   Procedure: REVERSE SHOULDER ARTHROPLASTY;  Surgeon: Justice Britain, MD;  [facility];  Service: Orthopedics;  Laterality: Right;  112min  . ROBOTIC ASSISTED TOTAL HYSTERECTOMY WITH BILATERAL SALPINGO OOPHERECTOMY Bilateral 11/11/2017   Procedure: XI ROBOTIC ASSISTED TOTAL HYSTERECTOMY WITH BILATERAL SALPINGO OOPHORECTOMY FOR UTERUS GREATER THAN 250 GRAMS;  Surgeon: Everitt Amber, MD;  [facility];  Service: Gynecology;  Laterality: Bilateral;  . TOTAL HIP ARTHROPLASTY  2008   Right    There were no vitals filed for this visit.  Subjective Assessment - 12/01/18 0943    Subjective   S: I'm trying to use it as much as I can.    Currently in Pain?  No/denies         [facility] OT Assessment - 12/01/18 0944      Assessment   Medical Diagnosis  Right shoulder reverse total arthroplasty      Precautions    Precautions  Shoulder    Type of Shoulder Precautions  See protocol. 7/23: At MD appointment: No ROM limitations.                OT Treatments/Exercises (OP) - 12/01/18 0944      Exercises   Exercises  Shoulder      Shoulder Exercises: Supine   Protraction  PROM;5 reps    Horizontal ABduction  PROM;5 reps    External Rotation  PROM;5 reps    Internal Rotation  PROM;5 reps    Flexion  PROM;5 reps    ABduction  PROM;5 reps      Shoulder Exercises: Standing   Protraction  AAROM;12 reps    Horizontal ABduction  AAROM;12 reps    External Rotation  AAROM;12 reps    Internal Rotation  AAROM;12 reps    Flexion  AAROM;12 reps    ABduction  AAROM;12 reps    Extension  Theraband;10 reps    Theraband Level (Shoulder Extension)  Level 2 (Red)    Row  Theraband;10 reps    Theraband Level (Shoulder Row)  Level 2 (Red)    Retraction  Theraband;10 reps    Theraband Level (Shoulder Retraction)  Level 2 (Red)      Shoulder Exercises: ROM/Strengthening   UBE (Upper Arm Bike)  Level 1 2' forward 2' reverse    pace: 5.0-6.0   Wall Wash  1'    Proximal Shoulder Strengthening, Supine  12X A/ROM no rest breaks    Other ROM/Strengthening Exercises  PVC pipe slide; 12X      Manual Therapy   Manual Therapy  Muscle Energy Technique    Manual therapy comments  Manual therapy completed prior to exercises    Muscle Energy Technique  Muscle energy technique completed to right arm to decrease tone and increase ROM and joint mobility.              OT Education - 12/01/18 0950    Education Details  scapular therband strengthening exercises.    Person(s) Educated  Patient    Methods  Explanation;Demonstration;Verbal cues;Handout;Tactile cues    Comprehension  Verbalized understanding;Returned demonstration       OT Short Term Goals - 11/03/18 1007      OT SHORT TERM GOAL #1   Title  Patient will be educated and independent with HEP to faciliate progress in therapy and allow her to  return to using her RUE as her dominant extremity.    Time  4    Period  Weeks    Status  On-going    Target Date  11/29/18      OT  SHORT TERM GOAL #2   Title  Patient will increase RUE P/ROM to [facility] in order to increase ability to complete UB dressing tasks with less difficulty.    Time  4    Period  Weeks    Status  On-going      OT SHORT TERM GOAL #3   Title  Patient will increase RUE strength to 3/5 in order to complete waist level activities with more strength and stability.    Time  4    Period  Weeks    Status  On-going      OT SHORT TERM GOAL #4   Title  Patient will decrease fascial restrictions in her RUE to mod amount or less in order to increase functional mobility needed to complete reaching tasks.    Time  4    Period  Weeks    Status  On-going      OT SHORT TERM GOAL #5   Title  Patient will decrease pain level to approximately 5/10 during functional daily tasks.    Time  4    Period  Weeks    Status  On-going        OT Long Term Goals - 11/03/18 1007      OT LONG TERM GOAL #1   Title  Patient will reach her highest level of independence while using her RUE as her dominant extremity for 75% or more tasks.    Time  8    Period  Weeks    Status  On-going      OT LONG TERM GOAL #2   Title  Patient will increase her A/ROM of her RUE to [facility] in order to complete overhead reaching tasks with increased comfort and ease.    Time  8    Period  Weeks    Status  On-going      OT LONG TERM GOAL #3   Title  Patient will increase RUE strength to 4/5 in order to complete normal household lifting tasks while using her RUE.    Time  8    Period  Weeks    Status  On-going      OT LONG TERM GOAL #4   Title  Patient will decrease her RUE fascial restrictions to min amount or less in order to increase her functional mobility needed when reaching into the overhead cabinets.    Time  8    Period  Weeks    Status  On-going      OT LONG TERM GOAL #5   Title  Pt will  report a decrease in pain of approximately 2/10 or less when completing daily tasks with her RUE.    Time  8    Period  Weeks    Status  On-going            Plan - 12/01/18 1020    Clinical Impression Statement  A: HEP was updated to include scapular theraband strengthening with red band. Patient did require some verbal and tactile cueing for proper body positioning, form, and technique. Discontinued manual therapy due to lack of fascial restrictions. Focused on use of muscle energy technique to increase passive ROM.    Body Structure / Function / Physical Skills  ADL;ROM;Scar mobility;IADL;Edema;Fascial restriction;Mobility;Strength;Coordination;Decreased knowledge of use of DME;Decreased knowledge of precautions;UE functional use;Pain    Plan  P: Follow up on scapular strengthening HEP. Continue with muscle energy technique to increase ROM during passive stretching. D/C myofascial release.  Consulted and Agree with Plan of Care  Patient       Patient will benefit from skilled therapeutic intervention in order to improve the following deficits and impairments:   Body Structure / Function / Physical Skills: ADL, ROM, Scar mobility, IADL, Edema, Fascial restriction, Mobility, Strength, Coordination, Decreased knowledge of use of DME, Decreased knowledge of precautions, UE functional use, Pain       Visit Diagnosis: 1. Acute pain of right shoulder   2. Other symptoms and signs involving the musculoskeletal system   3. Stiffness of right shoulder, not elsewhere classified       Problem List Patient Active Problem List   Diagnosis Date Noted  . S/P reverse total shoulder arthroplasty, right 10/13/2018  . Fibroid uterus 11/11/2017  . Fibroids 11/03/2017  . Acute blood loss anemia 11/22/2015  . Colitis 11/22/2015  . UGI bleed   . Melena   . History of Helicobacter pylori infection   . Other fatigue   . Weakness   . Collagenous colitis 07/16/2011  . Chronic diarrhea  06/09/2011  . History of peptic ulcer disease 05/08/2011  . Personal history of colonic polyps 05/08/2011  . Family history of malignant neoplasm of gastrointestinal tract 05/08/2011  . Other general symptoms  05/08/2011  . Iron deficiency anemia, unspecified  05/08/2011  . Diarrhea 05/08/2011  . Diverticulosis of colon (without mention of hemorrhage) 05/08/2011   Ailene Ravel, OTR/L,CBIS  [phone]  12/01/2018, 10:23 AM  [place], [place], [address] Phone: [phone]   Fax:  [phone]  Name: TEREA NEUBAUER MRN: [id number] Date of Birth: 28-Oct-1931

## 2018-12-05 ENCOUNTER — Other Ambulatory Visit: Payer: Self-pay

## 2018-12-05 ENCOUNTER — Encounter (HOSPITAL_COMMUNITY): Payer: Self-pay

## 2018-12-05 ENCOUNTER — Ambulatory Visit (HOSPITAL_COMMUNITY): Payer: Medicare Other | Attending: Orthopedic Surgery

## 2018-12-05 DIAGNOSIS — M25611 Stiffness of right shoulder, not elsewhere classified: Secondary | ICD-10-CM | POA: Diagnosis not present

## 2018-12-05 DIAGNOSIS — R29898 Other symptoms and signs involving the musculoskeletal system: Secondary | ICD-10-CM | POA: Insufficient documentation

## 2018-12-05 DIAGNOSIS — M25511 Pain in right shoulder: Secondary | ICD-10-CM | POA: Insufficient documentation

## 2018-12-05 NOTE — Therapy (Signed)
[place], [place], [address] Phone: [phone]   Fax:  [phone]  Occupational Therapy Treatment  Patient Details  Name: Carol Wells MRN: [id number] Date of Birth: [birth year] Referring Provider (OT): Justice Britain, MD   Encounter Date: 12/05/2018  OT End of Session - 12/05/18 1310    Visit Number  11    Number of Visits  16    Date for OT Re-Evaluation  12/27/18    Authorization Type  1) Medicare 80/20% 2) BC[employer] $10 copay with 30 visit limit. 0 used.    Authorization Time Period  see above    Authorization - Visit Number  10   medicare: 2   Authorization - Number of Visits  30   medicare: 10   OT Start Time  1130    OT Stop Time  1215    OT Time Calculation (min)  45 min    Activity Tolerance  Patient tolerated treatment well    Behavior During Therapy  WFL for tasks assessed/performed       Past Medical History:  Diagnosis Date  . Anemia 11/2015  . Arthritis   . Blood transfusion 2017 JULY 22  . Carotid stenosis    Dopplers 1/60: RICA 73-71%, LICA 0-62%  . Cataract    REMOVED  . Chronic headaches    HX MIGRAINES  . Collagenous colitis   . Diverticulosis 2013   Colonoscopy  . Gastric ulcer 11/2015  . Glaucoma    BOTH EYES  . History of spinal stenosis   . History of uterine fibroid   . Hx of cardiac catheterization    LHC 3/14: no angiographic CAD, EF 65%  . Hx of echocardiogram    Echocardiogram 06/21/12: Mild LVH, EF 69-48%, grade 2 diastolic dysfunction, mild MR  . Hyperlipidemia   . Hypertension   . Hypothyroidism   . Internal hemorrhoids 2009   Colonoscopy  . UTI (urinary tract infection) HX OF    Past Surgical History:  Procedure Laterality Date  . BUNIONECTOMY  YRS AGO LEFT   RIGHT CONE AT [facility] 3 YRS AGO  . BUNIONECTOMY Left   . CATARACT EXTRACTION W/ INTRAOCULAR LENS IMPLANT  03/2010   Left  . CATARACT EXTRACTION W/ INTRAOCULAR LENS IMPLANT Bilateral 11/2010  .  COLONOSCOPY  2013   NORMAL   . ESOPHAGOGASTRODUODENOSCOPY N/A 11/23/2015   Procedure: ESOPHAGOGASTRODUODENOSCOPY (EGD);  Surgeon: Rogene Houston, MD;  [facility];  Service: Endoscopy;  Laterality: N/A;  . EUS N/A 12/19/2015   Procedure: ESOPHAGEAL ENDOSCOPIC ULTRASOUND (EUS) RADIAL;  Surgeon: Milus Banister, MD;  [facility];  Service: Endoscopy;  Laterality: N/A;  . FUNCTIONAL ENDOSCOPIC SINUS SURGERY    . HAND SURGERY Right 1998  . INGUINAL HERNIA REPAIR Left 03/11/2016   Procedure: LEFT INGUINAL HERINA REPAIR WITH MESH;  Surgeon: Donnie Mesa, MD;  Location: [facility];  Service: General;  Laterality: Left;  . INSERTION OF MESH Left 03/11/2016   Procedure: INSERTION OF MESH;  Surgeon: Donnie Mesa, MD;  Location: [facility];  Service: General;  Laterality: Left;  . IR RADIOLOGIST EVAL & MGMT  07/01/2017  . LAMINECTOMY  03/2008  . LAPAROTOMY N/A 11/11/2017   Procedure: MINI LAPAROTOMY;  Surgeon: Everitt Amber, MD;  [facility];  Service: Gynecology;  Laterality: N/A;  . POLYPECTOMY    . REVERSE SHOULDER ARTHROPLASTY Right 10/13/2018   Procedure: REVERSE SHOULDER ARTHROPLASTY;  Surgeon: Justice Britain, MD;  [facility];  Service: Orthopedics;  Laterality: Right;  157min  . ROBOTIC ASSISTED TOTAL HYSTERECTOMY WITH BILATERAL SALPINGO OOPHERECTOMY Bilateral 11/11/2017   Procedure: XI ROBOTIC ASSISTED TOTAL HYSTERECTOMY WITH BILATERAL SALPINGO OOPHORECTOMY FOR UTERUS GREATER THAN 250 GRAMS;  Surgeon: Everitt Amber, MD;  [facility];  Service: Gynecology;  Laterality: Bilateral;  . TOTAL HIP ARTHROPLASTY  2008   Right    There were no vitals filed for this visit.  Subjective Assessment - 12/05/18 1141    Subjective   S: I've been working with the band. It's tough but I can tell I'm getting stronger.    Currently in Pain?  Yes    Pain Score  3     Pain Location  Shoulder    Pain Orientation  Right    Pain Descriptors / Indicators  Sore         [facility] OT Assessment  - 12/05/18 1141      Assessment   Medical Diagnosis  Right shoulder reverse total arthroplasty      Precautions   Precautions  Shoulder    Type of Shoulder Precautions  At MD appointment: No ROM limitations.                OT Treatments/Exercises (OP) - 12/05/18 1142      Exercises   Exercises  Shoulder      Shoulder Exercises: Supine   Protraction  PROM;5 reps;AROM;10 reps    Horizontal ABduction  PROM;5 reps;AROM;10 reps    External Rotation  PROM;5 reps;AROM;10 reps    Internal Rotation  PROM;5 reps;AROM;10 reps    Flexion  PROM;5 reps;AROM;10 reps    ABduction  PROM;5 reps;AROM;10 reps      Shoulder Exercises: Pulleys   Flexion  1 minute    ABduction  1 minute      Shoulder Exercises: ROM/Strengthening   Wall Wash  1'      Shoulder Exercises: Stretch   Corner Stretch  2 reps;20 seconds   doorway   Internal Rotation Stretch  2 reps   20" hold with towel horizontal   Wall Stretch - Flexion  2 reps;20 seconds    Wall Stretch - ABduction  2 reps;20 seconds      Functional Reaching Activities   Mid Level  Pt completed functional reaching task while seated. Placed resistive clothespins on vertical bar using her RUE. Able to place them up to 23 cm with max difficulty.       Manual Therapy   Manual Therapy  Muscle Energy Technique    Manual therapy comments  Manual therapy completed prior to exercises    Muscle Energy Technique  Muscle energy technique completed to right arm to decrease tone and increase ROM and joint mobility.              OT Education - 12/05/18 1200    Education Details  shoulder stretches    Person(s) Educated  Patient    Methods  Explanation;Demonstration;Verbal cues;Handout    Comprehension  Verbalized understanding;Returned demonstration       OT Short Term Goals - 11/03/18 1007      OT SHORT TERM GOAL #1   Title  Patient will be educated and independent with HEP to faciliate progress in therapy and allow her to return to  using her RUE as her dominant extremity.    Time  4    Period  Weeks    Status  On-going    Target Date  11/29/18  OT SHORT TERM GOAL #2   Title  Patient will increase RUE P/ROM to [facility] in order to increase ability to complete UB dressing tasks with less difficulty.    Time  4    Period  Weeks    Status  On-going      OT SHORT TERM GOAL #3   Title  Patient will increase RUE strength to 3/5 in order to complete waist level activities with more strength and stability.    Time  4    Period  Weeks    Status  On-going      OT SHORT TERM GOAL #4   Title  Patient will decrease fascial restrictions in her RUE to mod amount or less in order to increase functional mobility needed to complete reaching tasks.    Time  4    Period  Weeks    Status  On-going      OT SHORT TERM GOAL #5   Title  Patient will decrease pain level to approximately 5/10 during functional daily tasks.    Time  4    Period  Weeks    Status  On-going        OT Long Term Goals - 11/03/18 1007      OT LONG TERM GOAL #1   Title  Patient will reach her highest level of independence while using her RUE as her dominant extremity for 75% or more tasks.    Time  8    Period  Weeks    Status  On-going      OT LONG TERM GOAL #2   Title  Patient will increase her A/ROM of her RUE to [facility] in order to complete overhead reaching tasks with increased comfort and ease.    Time  8    Period  Weeks    Status  On-going      OT LONG TERM GOAL #3   Title  Patient will increase RUE strength to 4/5 in order to complete normal household lifting tasks while using her RUE.    Time  8    Period  Weeks    Status  On-going      OT LONG TERM GOAL #4   Title  Patient will decrease her RUE fascial restrictions to min amount or less in order to increase her functional mobility needed when reaching into the overhead cabinets.    Time  8    Period  Weeks    Status  On-going      OT LONG TERM GOAL #5   Title  Pt will report a  decrease in pain of approximately 2/10 or less when completing daily tasks with her RUE.    Time  8    Period  Weeks    Status  On-going            Plan - 12/05/18 1332    Clinical Impression Statement  A: Patient was able to progress to A/ROM supine this date. Added shoulder stretches to HEP with verbal and physical demonstration provided. Appears to have a hard end feel during passive shoulder flexion stretching and abduction which does not appear to be muscle tightness. Did achieve slightly more ROM with use of muscle energy technique during external rotation.    Body Structure / Function / Physical Skills  ADL;ROM;Scar mobility;IADL;Edema;Fascial restriction;Mobility;Strength;Coordination;Decreased knowledge of use of DME;Decreased knowledge of precautions;UE functional use;Pain    Plan  P: D/C myofascial release. Continue with A/ROM supine. Continue to progress  towards standing A/ROM when able to complete. Functional reaching task.    Consulted and Agree with Plan of Care  Patient       Patient will benefit from skilled therapeutic intervention in order to improve the following deficits and impairments:   Body Structure / Function / Physical Skills: ADL, ROM, Scar mobility, IADL, Edema, Fascial restriction, Mobility, Strength, Coordination, Decreased knowledge of use of DME, Decreased knowledge of precautions, UE functional use, Pain       Visit Diagnosis: 1. Stiffness of right shoulder, not elsewhere classified   2. Other symptoms and signs involving the musculoskeletal system   3. Acute pain of right shoulder       Problem List Patient Active Problem List   Diagnosis Date Noted  . S/P reverse total shoulder arthroplasty, right 10/13/2018  . Fibroid uterus 11/11/2017  . Fibroids 11/03/2017  . Acute blood loss anemia 11/22/2015  . Colitis 11/22/2015  . UGI bleed   . Melena   . History of Helicobacter pylori infection   . Other fatigue   . Weakness   . Collagenous  colitis 07/16/2011  . Chronic diarrhea 06/09/2011  . History of peptic ulcer disease 05/08/2011  . Personal history of colonic polyps 05/08/2011  . Family history of malignant neoplasm of gastrointestinal tract 05/08/2011  . Other general symptoms  05/08/2011  . Iron deficiency anemia, unspecified  05/08/2011  . Diarrhea 05/08/2011  . Diverticulosis of colon (without mention of hemorrhage) 05/08/2011   Ailene Ravel, OTR/L,CBIS  [phone]  12/05/2018, 1:37 PM  [place] [address] [place], [place], [address] Phone: [phone]   Fax:  [phone]  Name: Carol Wells MRN: [id number] Date of Birth: Jan 11, 1932

## 2018-12-05 NOTE — Patient Instructions (Addendum)
Complete the following exercises 2-3 times a day.  Doorway Stretch  Place each hand opposite each other on the doorway. (You can change where you feel the stretch by moving arms higher or lower.) Step through with one foot and bend front knee until a stretch is felt and hold. Step through with the opposite foot on the next rep. Hold for __20___ seconds. Repeat __2__times.     Internal Rotation Across Back  Grab the end of a towel with your affected side, palm facing backwards. Grab the towel with your unaffected side and pull your affected hand across your back until you feel a stretch in the front of your shoulder. If you feel pain, pull just to the pain, do not pull through the pain. Hold. Return your affected arm to your side. Try to keep your hand/arm close to your body during the entire movement.     Hold for 20 seconds. Complete 2 times.        Posterior Capsule Stretch   Stand or sit, one arm across body so hand rests over opposite shoulder. Gently push on crossed elbow with other hand until stretch is felt in shoulder of crossed arm. Hold _20__ seconds.  Repeat _2__ times per session. Do ___ sessions per day.   Wall Flexion  Slide your arm up the wall or door frame until a stretch is felt in your shoulder . Hold for 20 seconds. Complete 2 times     Shoulder Abduction Stretch  Stand side ways by a wall with affected up on wall. Gently step in toward wall to feel stretch. Hold for 20 seconds. Complete 2 times.

## 2018-12-07 ENCOUNTER — Ambulatory Visit (HOSPITAL_COMMUNITY): Payer: Medicare Other | Admitting: Occupational Therapy

## 2018-12-07 ENCOUNTER — Encounter (HOSPITAL_COMMUNITY): Payer: Self-pay | Admitting: Occupational Therapy

## 2018-12-07 ENCOUNTER — Other Ambulatory Visit: Payer: Self-pay

## 2018-12-07 DIAGNOSIS — R29898 Other symptoms and signs involving the musculoskeletal system: Secondary | ICD-10-CM

## 2018-12-07 DIAGNOSIS — M25511 Pain in right shoulder: Secondary | ICD-10-CM | POA: Diagnosis not present

## 2018-12-07 DIAGNOSIS — M25611 Stiffness of right shoulder, not elsewhere classified: Secondary | ICD-10-CM | POA: Diagnosis not present

## 2018-12-07 NOTE — Therapy (Signed)
[place], [place], [address] Phone: [phone]   Fax:  [phone]  Occupational Therapy Treatment  Patient Details  Name: Carol Wells MRN: [id number] Date of Birth: 1931-05-17 Referring Provider (OT): Justice Britain, MD   Encounter Date: 12/07/2018  OT End of Session - 12/07/18 1201    Visit Number  12    Number of Visits  16    Date for OT Re-Evaluation  12/27/18    Authorization Type  1) Medicare 80/20% 2) [employer] $10 copay with 30 visit limit. 0 used.    Authorization Time Period  see above    Authorization - Visit Number  11   medicare: 3   Authorization - Number of Visits  30   medicare: 10   OT Start Time  1497    OT Stop Time  1158    OT Time Calculation (min)  43 min    Activity Tolerance  Patient tolerated treatment well    Behavior During Therapy  WFL for tasks assessed/performed       Past Medical History:  Diagnosis Date  . Anemia 11/2015  . Arthritis   . Blood transfusion 2017 JULY 22  . Carotid stenosis    Dopplers 0/26: RICA 37-85%, LICA 8-85%  . Cataract    REMOVED  . Chronic headaches    HX MIGRAINES  . Collagenous colitis   . Diverticulosis 2013   Colonoscopy  . Gastric ulcer 11/2015  . Glaucoma    BOTH EYES  . History of spinal stenosis   . History of uterine fibroid   . Hx of cardiac catheterization    LHC 3/14: no angiographic CAD, EF 65%  . Hx of echocardiogram    Echocardiogram 06/21/12: Mild LVH, EF 02-77%, grade 2 diastolic dysfunction, mild MR  . Hyperlipidemia   . Hypertension   . Hypothyroidism   . Internal hemorrhoids 2009   Colonoscopy  . UTI (urinary tract infection) HX OF    Past Surgical History:  Procedure Laterality Date  . BUNIONECTOMY  YRS AGO LEFT   RIGHT CONE AT [facility] 3 YRS AGO  . BUNIONECTOMY Left   . CATARACT EXTRACTION W/ INTRAOCULAR LENS IMPLANT  03/2010   Left  . CATARACT EXTRACTION W/ INTRAOCULAR LENS IMPLANT Bilateral 11/2010  .  COLONOSCOPY  2013   NORMAL   . ESOPHAGOGASTRODUODENOSCOPY N/A 11/23/2015   Procedure: ESOPHAGOGASTRODUODENOSCOPY (EGD);  Surgeon: Rogene Houston, MD;  [facility];  Service: Endoscopy;  Laterality: N/A;  . EUS N/A 12/19/2015   Procedure: ESOPHAGEAL ENDOSCOPIC ULTRASOUND (EUS) RADIAL;  Surgeon: Milus Banister, MD;  [facility];  Service: Endoscopy;  Laterality: N/A;  . FUNCTIONAL ENDOSCOPIC SINUS SURGERY    . HAND SURGERY Right 1998  . INGUINAL HERNIA REPAIR Left 03/11/2016   Procedure: LEFT INGUINAL HERINA REPAIR WITH MESH;  Surgeon: Donnie Mesa, MD;  Location: [place];  Service: General;  Laterality: Left;  . INSERTION OF MESH Left 03/11/2016   Procedure: INSERTION OF MESH;  Surgeon: Donnie Mesa, MD;  Location: [place];  Service: General;  Laterality: Left;  . IR RADIOLOGIST EVAL & MGMT  07/01/2017  . LAMINECTOMY  03/2008  . LAPAROTOMY N/A 11/11/2017   Procedure: MINI LAPAROTOMY;  Surgeon: Everitt Amber, MD;  [facility];  Service: Gynecology;  Laterality: N/A;  . POLYPECTOMY    . REVERSE SHOULDER ARTHROPLASTY Right 10/13/2018   Procedure: REVERSE SHOULDER ARTHROPLASTY;  Surgeon: Justice Britain, MD;  [facility];  Service: Orthopedics;  Laterality: Right;  125min  . ROBOTIC ASSISTED TOTAL HYSTERECTOMY WITH BILATERAL SALPINGO OOPHERECTOMY Bilateral 11/11/2017   Procedure: XI ROBOTIC ASSISTED TOTAL HYSTERECTOMY WITH BILATERAL SALPINGO OOPHORECTOMY FOR UTERUS GREATER THAN 250 GRAMS;  Surgeon: Everitt Amber, MD;  [facility];  Service: Gynecology;  Laterality: Bilateral;  . TOTAL HIP ARTHROPLASTY  2008   Right    There were no vitals filed for this visit.  Subjective Assessment - 12/07/18 1113    Subjective   S: I can tell it's getting better and I've been working on it a lot.    Currently in Pain?  Yes    Pain Score  2     Pain Location  Shoulder    Pain Orientation  Right    Pain Descriptors / Indicators  Sore    Pain Type  Acute pain    Pain Radiating  Towards  n/a    Pain Onset  In the past 7 days    Pain Frequency  Intermittent    Aggravating Factors   increased movement and use    Pain Relieving Factors  pain medication if needed, relaxation techniques, ice machine-using less    Effect of Pain on Daily Activities  mod effect    Multiple Pain Sites  No         [facility] OT Assessment - 12/07/18 1113      Assessment   Medical Diagnosis  Right shoulder reverse total arthroplasty      Precautions   Precautions  Shoulder    Type of Shoulder Precautions  At MD appointment: No ROM limitations.                OT Treatments/Exercises (OP) - 12/07/18 1118      Exercises   Exercises  Shoulder      Shoulder Exercises: Supine   Protraction  PROM;5 reps;AROM;10 reps    Horizontal ABduction  PROM;5 reps;AROM;10 reps    External Rotation  PROM;5 reps;AROM;10 reps    Internal Rotation  PROM;5 reps;AROM;10 reps    Flexion  PROM;5 reps;AROM;10 reps    ABduction  PROM;5 reps;AROM;10 reps      Shoulder Exercises: Standing   Protraction  AAROM;15 reps    Horizontal ABduction  AAROM;12 reps    External Rotation  AAROM;15 reps    Internal Rotation  AAROM;15 reps    Flexion  AAROM;12 reps    ABduction  AAROM;12 reps    Extension  Theraband;10 reps    Theraband Level (Shoulder Extension)  Level 2 (Red)    Row  Theraband;10 reps    Theraband Level (Shoulder Row)  Level 2 (Red)    Retraction  Theraband;10 reps    Theraband Level (Shoulder Retraction)  Level 2 (Red)      Shoulder Exercises: Pulleys   Flexion  1 minute    ABduction  1 minute      Shoulder Exercises: ROM/Strengthening   Wall Wash  1'    Proximal Shoulder Strengthening, Supine  12X A/ROM no rest breaks               OT Short Term Goals - 11/03/18 1007      OT SHORT TERM GOAL #1   Title  Patient will be educated and independent with HEP to faciliate progress in therapy and allow her to return to using her RUE as her dominant extremity.    Time  4     Period  Weeks    Status  On-going  Target Date  11/29/18      OT SHORT TERM GOAL #2   Title  Patient will increase RUE P/ROM to [facility] in order to increase ability to complete UB dressing tasks with less difficulty.    Time  4    Period  Weeks    Status  On-going      OT SHORT TERM GOAL #3   Title  Patient will increase RUE strength to 3/5 in order to complete waist level activities with more strength and stability.    Time  4    Period  Weeks    Status  On-going      OT SHORT TERM GOAL #4   Title  Patient will decrease fascial restrictions in her RUE to mod amount or less in order to increase functional mobility needed to complete reaching tasks.    Time  4    Period  Weeks    Status  On-going      OT SHORT TERM GOAL #5   Title  Patient will decrease pain level to approximately 5/10 during functional daily tasks.    Time  4    Period  Weeks    Status  On-going        OT Long Term Goals - 11/03/18 1007      OT LONG TERM GOAL #1   Title  Patient will reach her highest level of independence while using her RUE as her dominant extremity for 75% or more tasks.    Time  8    Period  Weeks    Status  On-going      OT LONG TERM GOAL #2   Title  Patient will increase her A/ROM of her RUE to [facility] in order to complete overhead reaching tasks with increased comfort and ease.    Time  8    Period  Weeks    Status  On-going      OT LONG TERM GOAL #3   Title  Patient will increase RUE strength to 4/5 in order to complete normal household lifting tasks while using her RUE.    Time  8    Period  Weeks    Status  On-going      OT LONG TERM GOAL #4   Title  Patient will decrease her RUE fascial restrictions to min amount or less in order to increase her functional mobility needed when reaching into the overhead cabinets.    Time  8    Period  Weeks    Status  On-going      OT LONG TERM GOAL #5   Title  Pt will report a decrease in pain of approximately 2/10 or less when  completing daily tasks with her RUE.    Time  8    Period  Weeks    Status  On-going            Plan - 12/07/18 1201    Clinical Impression Statement  A: Pt reports she sat down to garden versus kneeling due to fear of pushing up on her arms. Continued with A/ROM supine and AA/ROM standing, completed scapular theraband with staggered stance due to low back pain when standing square. Verbal cuing for form and technique.    Body Structure / Function / Physical Skills  ADL;ROM;Scar mobility;IADL;Edema;Fascial restriction;Mobility;Strength;Coordination;Decreased knowledge of use of DME;Decreased knowledge of precautions;UE functional use;Pain    Plan  P: Practice kneeling and pushing to feet using mat table in gym or mat  placed on floor. Complete functional reaching task       Patient will benefit from skilled therapeutic intervention in order to improve the following deficits and impairments:   Body Structure / Function / Physical Skills: ADL, ROM, Scar mobility, IADL, Edema, Fascial restriction, Mobility, Strength, Coordination, Decreased knowledge of use of DME, Decreased knowledge of precautions, UE functional use, Pain       Visit Diagnosis: 1. Stiffness of right shoulder, not elsewhere classified   2. Other symptoms and signs involving the musculoskeletal system   3. Acute pain of right shoulder       Problem List Patient Active Problem List   Diagnosis Date Noted  . S/P reverse total shoulder arthroplasty, right 10/13/2018  . Fibroid uterus 11/11/2017  . Fibroids 11/03/2017  . Acute blood loss anemia 11/22/2015  . Colitis 11/22/2015  . UGI bleed   . Melena   . History of Helicobacter pylori infection   . Other fatigue   . Weakness   . Collagenous colitis 07/16/2011  . Chronic diarrhea 06/09/2011  . History of peptic ulcer disease 05/08/2011  . Personal history of colonic polyps 05/08/2011  . Family history of malignant neoplasm of gastrointestinal tract 05/08/2011   . Other general symptoms  05/08/2011  . Iron deficiency anemia, unspecified  05/08/2011  . Diarrhea 05/08/2011  . Diverticulosis of colon (without mention of hemorrhage) 05/08/2011   Guadelupe Sabin, OTR/L  [phone] 12/07/2018, 12:03 PM  [place], [place], [address] Phone: [phone]   Fax:  [phone]  Name: Carol Wells MRN: [id number] Date of Birth: [birth year]

## 2018-12-12 ENCOUNTER — Ambulatory Visit (HOSPITAL_COMMUNITY): Payer: Medicare Other

## 2018-12-12 ENCOUNTER — Encounter (HOSPITAL_COMMUNITY): Payer: Self-pay

## 2018-12-12 ENCOUNTER — Other Ambulatory Visit: Payer: Self-pay

## 2018-12-12 DIAGNOSIS — M25611 Stiffness of right shoulder, not elsewhere classified: Secondary | ICD-10-CM | POA: Diagnosis not present

## 2018-12-12 DIAGNOSIS — M25511 Pain in right shoulder: Secondary | ICD-10-CM

## 2018-12-12 DIAGNOSIS — R29898 Other symptoms and signs involving the musculoskeletal system: Secondary | ICD-10-CM

## 2018-12-12 NOTE — Patient Instructions (Signed)
Repeat all exercises 10-15 times, 1-2 times per day.  1) Shoulder Protraction    Begin with elbows by your side, slowly "punch" straight out in front of you.      2) Shoulder Flexion  Standing:         Begin with arms at your side with thumbs pointed up, slowly raise both arms up and forward towards overhead.       3) Horizontal abduction/adduction    Standing:           Begin with arms straight out in front of you, bring out to the side in at "T" shape. Keep arms straight entire time.      4) Internal & External Rotation    *No band* -Stand with elbows at the side and elbows bent 90 degrees. Move your forearms away from your body, then bring back inward toward the body.     5) Shoulder Abduction  Standing:       Begin with your arms flat on the table next to your side. Slowly move your arms out to the side so that they go overhead, in a jumping jack or snow angel movement.         

## 2018-12-12 NOTE — Therapy (Signed)
[place], [place], [address] Phone: [phone]   Fax:  [phone]  Occupational Therapy Treatment  Patient Details  Name: Carol Wells MRN: [id number] Date of Birth: 09/05/1931 Referring Provider (OT): Justice Britain, MD   Encounter Date: 12/12/2018  OT End of Session - 12/12/18 1157    Visit Number  13    Number of Visits  16    Date for OT Re-Evaluation  12/27/18    Authorization Type  1) Medicare 80/20% 2) [employer] $10 copay with 30 visit limit. 0 used.    Authorization Time Period  see above    Authorization - Visit Number  12   medicare: 3   Authorization - Number of Visits  30   medicare: 10   OT Start Time  9741    OT Stop Time  1213    OT Time Calculation (min)  38 min    Activity Tolerance  Patient tolerated treatment well    Behavior During Therapy  WFL for tasks assessed/performed       Past Medical History:  Diagnosis Date  . Anemia 11/2015  . Arthritis   . Blood transfusion 2017 JULY 22  . Carotid stenosis    Dopplers 6/38: RICA 45-36%, LICA 4-68%  . Cataract    REMOVED  . Chronic headaches    HX MIGRAINES  . Collagenous colitis   . Diverticulosis 2013   Colonoscopy  . Gastric ulcer 11/2015  . Glaucoma    BOTH EYES  . History of spinal stenosis   . History of uterine fibroid   . Hx of cardiac catheterization    LHC 3/14: no angiographic CAD, EF 65%  . Hx of echocardiogram    Echocardiogram 06/21/12: Mild LVH, EF 03-21%, grade 2 diastolic dysfunction, mild MR  . Hyperlipidemia   . Hypertension   . Hypothyroidism   . Internal hemorrhoids 2009   Colonoscopy  . UTI (urinary tract infection) HX OF    Past Surgical History:  Procedure Laterality Date  . BUNIONECTOMY  YRS AGO LEFT   RIGHT CONE AT [facility] 3 YRS AGO  . BUNIONECTOMY Left   . CATARACT EXTRACTION W/ INTRAOCULAR LENS IMPLANT  03/2010   Left  . CATARACT EXTRACTION W/ INTRAOCULAR LENS IMPLANT Bilateral 11/2010  .  COLONOSCOPY  2013   NORMAL   . ESOPHAGOGASTRODUODENOSCOPY N/A 11/23/2015   Procedure: ESOPHAGOGASTRODUODENOSCOPY (EGD);  Surgeon: Rogene Houston, MD;  [facility];  Service: Endoscopy;  Laterality: N/A;  . EUS N/A 12/19/2015   Procedure: ESOPHAGEAL ENDOSCOPIC ULTRASOUND (EUS) RADIAL;  Surgeon: Milus Banister, MD;  [facility];  Service: Endoscopy;  Laterality: N/A;  . FUNCTIONAL ENDOSCOPIC SINUS SURGERY    . HAND SURGERY Right 1998  . INGUINAL HERNIA REPAIR Left 03/11/2016   Procedure: LEFT INGUINAL HERINA REPAIR WITH MESH;  Surgeon: Donnie Mesa, MD;  Location: [place];  Service: General;  Laterality: Left;  . INSERTION OF MESH Left 03/11/2016   Procedure: INSERTION OF MESH;  Surgeon: Donnie Mesa, MD;  Location: [place];  Service: General;  Laterality: Left;  . IR RADIOLOGIST EVAL & MGMT  07/01/2017  . LAMINECTOMY  03/2008  . LAPAROTOMY N/A 11/11/2017   Procedure: MINI LAPAROTOMY;  Surgeon: Everitt Amber, MD;  [facility];  Service: Gynecology;  Laterality: N/A;  . POLYPECTOMY    . REVERSE SHOULDER ARTHROPLASTY Right 10/13/2018   Procedure: REVERSE SHOULDER ARTHROPLASTY;  Surgeon: Justice Britain, MD;  [facility];  Service: Orthopedics;  Laterality: Right;  186min  . ROBOTIC ASSISTED TOTAL HYSTERECTOMY WITH BILATERAL SALPINGO OOPHERECTOMY Bilateral 11/11/2017   Procedure: XI ROBOTIC ASSISTED TOTAL HYSTERECTOMY WITH BILATERAL SALPINGO OOPHORECTOMY FOR UTERUS GREATER THAN 250 GRAMS;  Surgeon: Everitt Amber, MD;  [facility];  Service: Gynecology;  Laterality: Bilateral;  . TOTAL HIP ARTHROPLASTY  2008   Right    There were no vitals filed for this visit.  Subjective Assessment - 12/12/18 1144    Subjective   S: I was able to get up from kneeling outside over the weekend. I even did it from my kitchen as well.    Currently in Pain?  Yes    Pain Score  2     Pain Location  Shoulder    Pain Orientation  Right    Pain Descriptors / Indicators  Sore          [facility] OT Assessment - 12/12/18 1145      Assessment   Medical Diagnosis  Right shoulder reverse total arthroplasty      Precautions   Precautions  Shoulder    Type of Shoulder Precautions  At MD appointment: No ROM limitations.                OT Treatments/Exercises (OP) - 12/12/18 1145      Exercises   Exercises  Shoulder      Shoulder Exercises: Supine   Protraction  PROM;5 reps;AROM;12 reps    Horizontal ABduction  PROM;5 reps;AROM;12 reps    External Rotation  PROM;5 reps;AROM;12 reps    Internal Rotation  PROM;5 reps;AROM;12 reps    Flexion  PROM;5 reps;AROM;12 reps    ABduction  PROM;5 reps;AROM;12 reps      Shoulder Exercises: Standing   Protraction  AROM;10 reps    External Rotation  AROM;10 reps   with elbow against wall   Internal Rotation  AROM;10 reps   with elbow against wall   Flexion  AROM;10 reps      Shoulder Exercises: ROM/Strengthening   Wall Wash  1'    Proximal Shoulder Strengthening, Supine  12X A/ROM no rest breaks    Proximal Shoulder Strengthening, Seated  10X with A/ROM, no rest breaks      Functional Reaching Activities   Low Level  Finger ladder completed with patient being able to reach #9 each time. Complete 5 times going up.     Mid Level  Placed 10 cones on first shelf while attempting to reach to send shelf each time. All cones placed and removed with increased time.              OT Education - 12/12/18 1235    Education Details  Provided patient with updated HEP to include: A/ROM shoulder exercises. Patient instructed to complete A/ROM shoulder exercises, shoulder stretches, scapular theraband exercises. May stop all previous HEP exercises.    Person(s) Educated  Patient    Methods  Explanation;Demonstration;Handout;Verbal cues    Comprehension  Returned demonstration;Verbalized understanding       OT Short Term Goals - 11/03/18 1007      OT SHORT TERM GOAL #1   Title  Patient will be educated and  independent with HEP to faciliate progress in therapy and allow her to return to using her RUE as her dominant extremity.    Time  4    Period  Weeks    Status  On-going    Target Date  11/29/18      OT SHORT  TERM GOAL #2   Title  Patient will increase RUE P/ROM to [facility] in order to increase ability to complete UB dressing tasks with less difficulty.    Time  4    Period  Weeks    Status  On-going      OT SHORT TERM GOAL #3   Title  Patient will increase RUE strength to 3/5 in order to complete waist level activities with more strength and stability.    Time  4    Period  Weeks    Status  On-going      OT SHORT TERM GOAL #4   Title  Patient will decrease fascial restrictions in her RUE to mod amount or less in order to increase functional mobility needed to complete reaching tasks.    Time  4    Period  Weeks    Status  On-going      OT SHORT TERM GOAL #5   Title  Patient will decrease pain level to approximately 5/10 during functional daily tasks.    Time  4    Period  Weeks    Status  On-going        OT Long Term Goals - 11/03/18 1007      OT LONG TERM GOAL #1   Title  Patient will reach her highest level of independence while using her RUE as her dominant extremity for 75% or more tasks.    Time  8    Period  Weeks    Status  On-going      OT LONG TERM GOAL #2   Title  Patient will increase her A/ROM of her RUE to [facility] in order to complete overhead reaching tasks with increased comfort and ease.    Time  8    Period  Weeks    Status  On-going      OT LONG TERM GOAL #3   Title  Patient will increase RUE strength to 4/5 in order to complete normal household lifting tasks while using her RUE.    Time  8    Period  Weeks    Status  On-going      OT LONG TERM GOAL #4   Title  Patient will decrease her RUE fascial restrictions to min amount or less in order to increase her functional mobility needed when reaching into the overhead cabinets.    Time  8    Period   Weeks    Status  On-going      OT LONG TERM GOAL #5   Title  Pt will report a decrease in pain of approximately 2/10 or less when completing daily tasks with her RUE.    Time  8    Period  Weeks    Status  On-going            Plan - 12/12/18 1239    Clinical Impression Statement  A: Patient was able to progress to A/ROM for both supine and standing. Patient reports that she was able to get up from a kneeling position outside and in her kitchen this past weekend. VC were provided for all exercises. HEP was updated. patient is able to consistantly reach shoulder level reaching surfaces although struggles with anything higher.    Body Structure / Function / Physical Skills  ADL;ROM;Scar mobility;IADL;Edema;Fascial restriction;Mobility;Strength;Coordination;Decreased knowledge of use of DME;Decreased knowledge of precautions;UE functional use;Pain    Plan  P: Continue with functional reaching and A/ROM shoulder exercises. Add overhead lacing and  UBE bike.    Consulted and Agree with Plan of Care  Patient       Patient will benefit from skilled therapeutic intervention in order to improve the following deficits and impairments:   Body Structure / Function / Physical Skills: ADL, ROM, Scar mobility, IADL, Edema, Fascial restriction, Mobility, Strength, Coordination, Decreased knowledge of use of DME, Decreased knowledge of precautions, UE functional use, Pain       Visit Diagnosis: 1. Stiffness of right shoulder, not elsewhere classified   2. Acute pain of right shoulder   3. Other symptoms and signs involving the musculoskeletal system       Problem List Patient Active Problem List   Diagnosis Date Noted  . S/P reverse total shoulder arthroplasty, right 10/13/2018  . Fibroid uterus 11/11/2017  . Fibroids 11/03/2017  . Acute blood loss anemia 11/22/2015  . Colitis 11/22/2015  . UGI bleed   . Melena   . History of Helicobacter pylori infection   . Other fatigue   . Weakness    . Collagenous colitis 07/16/2011  . Chronic diarrhea 06/09/2011  . History of peptic ulcer disease 05/08/2011  . Personal history of colonic polyps 05/08/2011  . Family history of malignant neoplasm of gastrointestinal tract 05/08/2011  . Other general symptoms  05/08/2011  . Iron deficiency anemia, unspecified  05/08/2011  . Diarrhea 05/08/2011  . Diverticulosis of colon (without mention of hemorrhage) 05/08/2011   Ailene Ravel, OTR/L,CBIS  [phone]  12/12/2018, 12:42 PM  [facility] [address] [place], [place], [address] Phone: [phone]   Fax:  [phone]  Name: Carol Wells MRN: [id number] Date of Birth: February 11, 1932

## 2018-12-14 ENCOUNTER — Encounter (HOSPITAL_COMMUNITY): Payer: Medicare Other | Admitting: Specialist

## 2018-12-15 ENCOUNTER — Encounter (HOSPITAL_COMMUNITY): Payer: Medicare Other

## 2018-12-19 ENCOUNTER — Ambulatory Visit (HOSPITAL_COMMUNITY): Payer: Medicare Other

## 2018-12-19 ENCOUNTER — Encounter (HOSPITAL_COMMUNITY): Payer: Self-pay

## 2018-12-19 ENCOUNTER — Other Ambulatory Visit: Payer: Self-pay

## 2018-12-19 DIAGNOSIS — R29898 Other symptoms and signs involving the musculoskeletal system: Secondary | ICD-10-CM | POA: Diagnosis not present

## 2018-12-19 DIAGNOSIS — M25511 Pain in right shoulder: Secondary | ICD-10-CM

## 2018-12-19 DIAGNOSIS — M25611 Stiffness of right shoulder, not elsewhere classified: Secondary | ICD-10-CM | POA: Diagnosis not present

## 2018-12-19 NOTE — Therapy (Signed)
[place], [place], [address] Phone: [phone]   Fax:  [phone]  Occupational Therapy Treatment  Patient Details  Name: Carol Wells MRN: [id number] Date of Birth: February 20, 1932 Referring Provider (OT): Justice Britain, MD   Encounter Date: 12/19/2018  OT End of Session - 12/19/18 1205    Visit Number  14    Number of Visits  16    Date for OT Re-Evaluation  12/27/18    Authorization Type  1) Medicare 80/20% 2) [employer] $10 copay with 30 visit limit. 0 used.    Authorization Time Period  see above    Authorization - Visit Number  13   medicare: 4   Authorization - Number of Visits  30   medicare: 10   OT Start Time  2878    OT Stop Time  1213    OT Time Calculation (min)  38 min    Activity Tolerance  Patient tolerated treatment well    Behavior During Therapy  WFL for tasks assessed/performed       Past Medical History:  Diagnosis Date  . Anemia 11/2015  . Arthritis   . Blood transfusion 2017 JULY 22  . Carotid stenosis    Dopplers 6/76: RICA 72-09%, LICA 4-70%  . Cataract    REMOVED  . Chronic headaches    HX MIGRAINES  . Collagenous colitis   . Diverticulosis 2013   Colonoscopy  . Gastric ulcer 11/2015  . Glaucoma    BOTH EYES  . History of spinal stenosis   . History of uterine fibroid   . Hx of cardiac catheterization    LHC 3/14: no angiographic CAD, EF 65%  . Hx of echocardiogram    Echocardiogram 06/21/12: Mild LVH, EF 96-28%, grade 2 diastolic dysfunction, mild MR  . Hyperlipidemia   . Hypertension   . Hypothyroidism   . Internal hemorrhoids 2009   Colonoscopy  . UTI (urinary tract infection) HX OF    Past Surgical History:  Procedure Laterality Date  . BUNIONECTOMY  YRS AGO LEFT   RIGHT CONE AT [facility] 3 YRS AGO  . BUNIONECTOMY Left   . CATARACT EXTRACTION W/ INTRAOCULAR LENS IMPLANT  03/2010   Left  . CATARACT EXTRACTION W/ INTRAOCULAR LENS IMPLANT Bilateral 11/2010  .  COLONOSCOPY  2013   NORMAL   . ESOPHAGOGASTRODUODENOSCOPY N/A 11/23/2015   Procedure: ESOPHAGOGASTRODUODENOSCOPY (EGD);  Surgeon: Rogene Houston, MD;  [facility];  Service: Endoscopy;  Laterality: N/A;  . EUS N/A 12/19/2015   Procedure: ESOPHAGEAL ENDOSCOPIC ULTRASOUND (EUS) RADIAL;  Surgeon: Milus Banister, MD;  [facility];  Service: Endoscopy;  Laterality: N/A;  . FUNCTIONAL ENDOSCOPIC SINUS SURGERY    . HAND SURGERY Right 1998  . INGUINAL HERNIA REPAIR Left 03/11/2016   Procedure: LEFT INGUINAL HERINA REPAIR WITH MESH;  Surgeon: Donnie Mesa, MD;  Location: [place];  Service: General;  Laterality: Left;  . INSERTION OF MESH Left 03/11/2016   Procedure: INSERTION OF MESH;  Surgeon: Donnie Mesa, MD;  Location: [place];  Service: General;  Laterality: Left;  . IR RADIOLOGIST EVAL & MGMT  07/01/2017  . LAMINECTOMY  03/2008  . LAPAROTOMY N/A 11/11/2017   Procedure: MINI LAPAROTOMY;  Surgeon: Everitt Amber, MD;  [facility];  Service: Gynecology;  Laterality: N/A;  . POLYPECTOMY    . REVERSE SHOULDER ARTHROPLASTY Right 10/13/2018   Procedure: REVERSE SHOULDER ARTHROPLASTY;  Surgeon: Justice Britain, MD;  [facility];  Service: Orthopedics;  Laterality: Right;  161min  . ROBOTIC ASSISTED TOTAL HYSTERECTOMY WITH BILATERAL SALPINGO OOPHERECTOMY Bilateral 11/11/2017   Procedure: XI ROBOTIC ASSISTED TOTAL HYSTERECTOMY WITH BILATERAL SALPINGO OOPHORECTOMY FOR UTERUS GREATER THAN 250 GRAMS;  Surgeon: Everitt Amber, MD;  [facility];  Service: Gynecology;  Laterality: Bilateral;  . TOTAL HIP ARTHROPLASTY  2008   Right    There were no vitals filed for this visit.  Subjective Assessment - 12/19/18 1152    Subjective   S: I have been working on my standing exercises.    Currently in Pain?  Yes    Pain Score  2     Pain Location  Shoulder    Pain Orientation  Right    Pain Descriptors / Indicators  Sore    Pain Type  Acute pain    Pain Radiating Towards  N/A    Pain  Onset  1 to 4 weeks ago    Pain Frequency  Intermittent    Aggravating Factors   increased movement and use    Pain Relieving Factors  pain medication if needed, relxation techniques, ice machine- using less    Effect of Pain on Daily Activities  min effect    Multiple Pain Sites  No         [facility] OT Assessment - 12/19/18 1144      Assessment   Medical Diagnosis  Right shoulder reverse total arthroplasty      Precautions   Precautions  Shoulder    Type of Shoulder Precautions  At MD appointment: No ROM limitations.                OT Treatments/Exercises (OP) - 12/19/18 1144      Exercises   Exercises  Shoulder      Shoulder Exercises: Supine   Protraction  PROM;5 reps;AROM;12 reps    Horizontal ABduction  PROM;5 reps;AROM;12 reps    External Rotation  PROM;5 reps;AROM;12 reps    Internal Rotation  PROM;5 reps;AROM;12 reps    Flexion  PROM;5 reps;AROM;12 reps    ABduction  PROM;5 reps;AROM;12 reps      Shoulder Exercises: Standing   Horizontal ABduction  AROM;10 reps    Flexion  AROM;10 reps    ABduction  AROM;10 reps      Shoulder Exercises: ROM/Strengthening   UBE (Upper Arm Bike)  Level 1 2' forward 2' reverse    pace: 5.0-6.0   Wall Wash  1'    Over Head Lace  1' seated    Proximal Shoulder Strengthening, Supine  12X A/ROM no rest breaks      Shoulder Exercises: Stretch   External Rotation Stretch  2 reps;20 seconds   using wall and table              OT Short Term Goals - 11/03/18 1007      OT SHORT TERM GOAL #1   Title  Patient will be educated and independent with HEP to faciliate progress in therapy and allow her to return to using her RUE as her dominant extremity.    Time  4    Period  Weeks    Status  On-going    Target Date  11/29/18      OT SHORT TERM GOAL #2   Title  Patient will increase RUE P/ROM to [facility] in order to increase ability to complete UB dressing tasks with less difficulty.    Time  4    Period  Weeks  Status   On-going      OT SHORT TERM GOAL #3   Title  Patient will increase RUE strength to 3/5 in order to complete waist level activities with more strength and stability.    Time  4    Period  Weeks    Status  On-going      OT SHORT TERM GOAL #4   Title  Patient will decrease fascial restrictions in her RUE to mod amount or less in order to increase functional mobility needed to complete reaching tasks.    Time  4    Period  Weeks    Status  On-going      OT SHORT TERM GOAL #5   Title  Patient will decrease pain level to approximately 5/10 during functional daily tasks.    Time  4    Period  Weeks    Status  On-going        OT Long Term Goals - 11/03/18 1007      OT LONG TERM GOAL #1   Title  Patient will reach her highest level of independence while using her RUE as her dominant extremity for 75% or more tasks.    Time  8    Period  Weeks    Status  On-going      OT LONG TERM GOAL #2   Title  Patient will increase her A/ROM of her RUE to [facility] in order to complete overhead reaching tasks with increased comfort and ease.    Time  8    Period  Weeks    Status  On-going      OT LONG TERM GOAL #3   Title  Patient will increase RUE strength to 4/5 in order to complete normal household lifting tasks while using her RUE.    Time  8    Period  Weeks    Status  On-going      OT LONG TERM GOAL #4   Title  Patient will decrease her RUE fascial restrictions to min amount or less in order to increase her functional mobility needed when reaching into the overhead cabinets.    Time  8    Period  Weeks    Status  On-going      OT LONG TERM GOAL #5   Title  Pt will report a decrease in pain of approximately 2/10 or less when completing daily tasks with her RUE.    Time  8    Period  Weeks    Status  On-going            Plan - 12/19/18 1230    Clinical Impression Statement  A: Continued to focus on functional reaching during overhead lacing activity. Patient continues to have  ROM limitations. Therapist demonstrated 3 variation of a stretch to work on external rotation in which patient was able to demonstrate technique with VC.    Body Structure / Function / Physical Skills  ADL;ROM;Scar mobility;IADL;Edema;Fascial restriction;Mobility;Strength;Coordination;Decreased knowledge of use of DME;Decreased knowledge of precautions;UE functional use;Pain    Plan  P: Continue to work on functional reaching finger ladder, PVC pipe slide). Attempt scapular mobility technique prior to passive stretching to see if it will assist with increasing ROM.       Patient will benefit from skilled therapeutic intervention in order to improve the following deficits and impairments:   Body Structure / Function / Physical Skills: ADL, ROM, Scar mobility, IADL, Edema, Fascial restriction, Mobility, Strength, Coordination, Decreased knowledge  of use of DME, Decreased knowledge of precautions, UE functional use, Pain       Visit Diagnosis: 1. Acute pain of right shoulder   2. Stiffness of right shoulder, not elsewhere classified   3. Other symptoms and signs involving the musculoskeletal system       Problem List Patient Active Problem List   Diagnosis Date Noted  . S/P reverse total shoulder arthroplasty, right 10/13/2018  . Fibroid uterus 11/11/2017  . Fibroids 11/03/2017  . Acute blood loss anemia 11/22/2015  . Colitis 11/22/2015  . UGI bleed   . Melena   . History of Helicobacter pylori infection   . Other fatigue   . Weakness   . Collagenous colitis 07/16/2011  . Chronic diarrhea 06/09/2011  . History of peptic ulcer disease 05/08/2011  . Personal history of colonic polyps 05/08/2011  . Family history of malignant neoplasm of gastrointestinal tract 05/08/2011  . Other general symptoms  05/08/2011  . Iron deficiency anemia, unspecified  05/08/2011  . Diarrhea 05/08/2011  . Diverticulosis of colon (without mention of hemorrhage) 05/08/2011   Ailene Ravel,  OTR/L,CBIS  [phone]  12/19/2018, 12:33 PM  [facility] [address] [place], [place], [address] Phone: [phone]   Fax:  [phone]  Name: MIAMARIE MOLL MRN: [id number] Date of Birth: January 11, 1932

## 2018-12-20 ENCOUNTER — Telehealth (HOSPITAL_COMMUNITY): Payer: Self-pay | Admitting: Specialist

## 2018-12-20 NOTE — Telephone Encounter (Signed)
Pt agreed to come in at 10am per Beth's request.

## 2018-12-21 ENCOUNTER — Ambulatory Visit (HOSPITAL_COMMUNITY): Payer: Medicare Other | Admitting: Specialist

## 2018-12-21 ENCOUNTER — Other Ambulatory Visit: Payer: Self-pay

## 2018-12-21 ENCOUNTER — Encounter (HOSPITAL_COMMUNITY): Payer: Self-pay | Admitting: Specialist

## 2018-12-21 DIAGNOSIS — M25511 Pain in right shoulder: Secondary | ICD-10-CM | POA: Diagnosis not present

## 2018-12-21 DIAGNOSIS — R29898 Other symptoms and signs involving the musculoskeletal system: Secondary | ICD-10-CM

## 2018-12-21 DIAGNOSIS — M25611 Stiffness of right shoulder, not elsewhere classified: Secondary | ICD-10-CM

## 2018-12-21 NOTE — Therapy (Signed)
[place], [place], [address] Phone: [phone]   Fax:  [phone]  Occupational Therapy Treatment  Patient Details  Name: Carol Wells MRN: [id number] Date of Birth: [birth year] Referring Provider (OT): Justice Britain, MD   Encounter Date: 12/21/2018  OT End of Session - 12/21/18 1302    Visit Number  15    Number of Visits  16    Date for OT Re-Evaluation  12/27/18    Authorization Type  1) Medicare 80/20% 2) [employer] $10 copay with 30 visit limit. 0 used.    Authorization Time Period  see above    Authorization - Visit Number  14    Authorization - Number of Visits  30    OT Start Time  1002    OT Stop Time  1042    OT Time Calculation (min)  40 min    Activity Tolerance  Patient tolerated treatment well    Behavior During Therapy  WFL for tasks assessed/performed       Past Medical History:  Diagnosis Date  . Anemia 11/2015  . Arthritis   . Blood transfusion 2017 JULY 22  . Carotid stenosis    Dopplers 7/91: RICA 50-56%, LICA 9-79%  . Cataract    REMOVED  . Chronic headaches    HX MIGRAINES  . Collagenous colitis   . Diverticulosis 2013   Colonoscopy  . Gastric ulcer 11/2015  . Glaucoma    BOTH EYES  . History of spinal stenosis   . History of uterine fibroid   . Hx of cardiac catheterization    LHC 3/14: no angiographic CAD, EF 65%  . Hx of echocardiogram    Echocardiogram 06/21/12: Mild LVH, EF 48-01%, grade 2 diastolic dysfunction, mild MR  . Hyperlipidemia   . Hypertension   . Hypothyroidism   . Internal hemorrhoids 2009   Colonoscopy  . UTI (urinary tract infection) HX OF    Past Surgical History:  Procedure Laterality Date  . BUNIONECTOMY  YRS AGO LEFT   RIGHT CONE AT [facility] 3 YRS AGO  . BUNIONECTOMY Left   . CATARACT EXTRACTION W/ INTRAOCULAR LENS IMPLANT  03/2010   Left  . CATARACT EXTRACTION W/ INTRAOCULAR LENS IMPLANT Bilateral 11/2010  . COLONOSCOPY  2013   NORMAL   .  ESOPHAGOGASTRODUODENOSCOPY N/A 11/23/2015   Procedure: ESOPHAGOGASTRODUODENOSCOPY (EGD);  Surgeon: Rogene Houston, MD;  [facility];  Service: Endoscopy;  Laterality: N/A;  . EUS N/A 12/19/2015   Procedure: ESOPHAGEAL ENDOSCOPIC ULTRASOUND (EUS) RADIAL;  Surgeon: Milus Banister, MD;  [facility];  Service: Endoscopy;  Laterality: N/A;  . FUNCTIONAL ENDOSCOPIC SINUS SURGERY    . HAND SURGERY Right 1998  . INGUINAL HERNIA REPAIR Left 03/11/2016   Procedure: LEFT INGUINAL HERINA REPAIR WITH MESH;  Surgeon: Donnie Mesa, MD;  Location: [facility];  Service: General;  Laterality: Left;  . INSERTION OF MESH Left 03/11/2016   Procedure: INSERTION OF MESH;  Surgeon: Donnie Mesa, MD;  Location: [place] [facility];  Service: General;  Laterality: Left;  . IR RADIOLOGIST EVAL & MGMT  07/01/2017  . LAMINECTOMY  03/2008  . LAPAROTOMY N/A 11/11/2017   Procedure: MINI LAPAROTOMY;  Surgeon: Everitt Amber, MD;  [facility];  Service: Gynecology;  Laterality: N/A;  . POLYPECTOMY    . REVERSE SHOULDER ARTHROPLASTY Right 10/13/2018   Procedure: REVERSE SHOULDER ARTHROPLASTY;  Surgeon: Justice Britain, MD;  [facility];  Service: Orthopedics;  Laterality: Right;  187min  . ROBOTIC ASSISTED TOTAL HYSTERECTOMY WITH BILATERAL SALPINGO OOPHERECTOMY Bilateral 11/11/2017   Procedure: XI ROBOTIC ASSISTED TOTAL HYSTERECTOMY WITH BILATERAL SALPINGO OOPHORECTOMY FOR UTERUS GREATER THAN 250 GRAMS;  Surgeon: Everitt Amber, MD;  [facility];  Service: Gynecology;  Laterality: Bilateral;  . TOTAL HIP ARTHROPLASTY  2008   Right    There were no vitals filed for this visit.  Subjective Assessment - 12/21/18 1301    Subjective   S:  I can fix the front and side of my hair now.    Pain Score  2     Pain Location  Shoulder    Pain Orientation  Right    Pain Descriptors / Indicators  Aching                   OT Treatments/Exercises (OP) - 12/21/18 0001      Shoulder Exercises: Supine   Protraction   PROM;5 reps;AROM;15 reps    Horizontal ABduction  PROM;10 reps;AROM;15 reps    External Rotation  PROM;10 reps;AROM;15 reps    Internal Rotation  PROM;10 reps;AROM;15 reps    Flexion  PROM;10 reps;AROM;15 reps    ABduction  10 reps;PROM;AROM;15 reps      Shoulder Exercises: Standing   Protraction  AROM;10 reps    Horizontal ABduction  AROM;10 reps    External Rotation  Theraband;10 reps    Theraband Level (Shoulder External Rotation)  Level 2 (Red)    Internal Rotation  Theraband;10 reps    Theraband Level (Shoulder Internal Rotation)  Level 2 (Red)    Flexion  AROM;10 reps    ABduction  AROM;10 reps      Shoulder Exercises: Therapy Ball   Other Therapy Ball Exercises  stood facing wall and rolled ball up wall from waist height to shoulder height, completed up and dow 5 reps with visible trembling at end range flexion       Shoulder Exercises: ROM/Strengthening   Proximal Shoulder Strengthening, Supine  12X A/ROM no rest breaks    Rhythmic Stabilization, Supine  10 seconds at 70 degrees, 80 degrees, 95 degrees.  patient able to maintain positioning with only occasional loss of control              OT Education - 12/21/18 1301    Education Details  encouraged use of right arm with functional activities    Person(s) Educated  Patient    Methods  Explanation;Demonstration;Handout;Verbal cues    Comprehension  Returned demonstration;Verbalized understanding       OT Short Term Goals - 11/03/18 1007      OT SHORT TERM GOAL #1   Title  Patient will be educated and independent with HEP to faciliate progress in therapy and allow her to return to using her RUE as her dominant extremity.    Time  4    Period  Weeks    Status  On-going    Target Date  11/29/18      OT SHORT TERM GOAL #2   Title  Patient will increase RUE P/ROM to [facility] in order to increase ability to complete UB dressing tasks with less difficulty.    Time  4    Period  Weeks    Status  On-going      OT  SHORT TERM GOAL #3   Title  Patient will increase RUE strength to 3/5 in order to complete waist level activities with more strength and stability.    Time  4  Period  Weeks    Status  On-going      OT SHORT TERM GOAL #4   Title  Patient will decrease fascial restrictions in her RUE to mod amount or less in order to increase functional mobility needed to complete reaching tasks.    Time  4    Period  Weeks    Status  On-going      OT SHORT TERM GOAL #5   Title  Patient will decrease pain level to approximately 5/10 during functional daily tasks.    Time  4    Period  Weeks    Status  On-going        OT Long Term Goals - 11/03/18 1007      OT LONG TERM GOAL #1   Title  Patient will reach her highest level of independence while using her RUE as her dominant extremity for 75% or more tasks.    Time  8    Period  Weeks    Status  On-going      OT LONG TERM GOAL #2   Title  Patient will increase her A/ROM of her RUE to [facility] in order to complete overhead reaching tasks with increased comfort and ease.    Time  8    Period  Weeks    Status  On-going      OT LONG TERM GOAL #3   Title  Patient will increase RUE strength to 4/5 in order to complete normal household lifting tasks while using her RUE.    Time  8    Period  Weeks    Status  On-going      OT LONG TERM GOAL #4   Title  Patient will decrease her RUE fascial restrictions to min amount or less in order to increase her functional mobility needed when reaching into the overhead cabinets.    Time  8    Period  Weeks    Status  On-going      OT LONG TERM GOAL #5   Title  Pt will report a decrease in pain of approximately 2/10 or less when completing daily tasks with her RUE.    Time  8    Period  Weeks    Status  On-going            Plan - 12/21/18 1303    Clinical Impression Statement  A:  Patient demonstrating excellent form with therapeutic exercises this date.  Added functional reaching activity of rolling  therapy ball up and down wall from waist height to shoulder height, with visible trembling present in right arm during exercise at end range of flexion.    Body Structure / Function / Physical Skills  ADL;ROM;Scar mobility;IADL;Edema;Fascial restriction;Mobility;Strength;Coordination;Decreased knowledge of use of DME;Decreased knowledge of precautions;UE functional use;Pain    Plan  P:  Attempt scapular mobility technique prior to P/ROM.  attempt therapy ball exercises for greater functional reach in right arm.       Patient will benefit from skilled therapeutic intervention in order to improve the following deficits and impairments:   Body Structure / Function / Physical Skills: ADL, ROM, Scar mobility, IADL, Edema, Fascial restriction, Mobility, Strength, Coordination, Decreased knowledge of use of DME, Decreased knowledge of precautions, UE functional use, Pain       Visit Diagnosis: 1. Acute pain of right shoulder   2. Stiffness of right shoulder, not elsewhere classified   3. Other symptoms and signs involving the musculoskeletal system  Problem List Patient Active Problem List   Diagnosis Date Noted  . S/P reverse total shoulder arthroplasty, right 10/13/2018  . Fibroid uterus 11/11/2017  . Fibroids 11/03/2017  . Acute blood loss anemia 11/22/2015  . Colitis 11/22/2015  . UGI bleed   . Melena   . History of Helicobacter pylori infection   . Other fatigue   . Weakness   . Collagenous colitis 07/16/2011  . Chronic diarrhea 06/09/2011  . History of peptic ulcer disease 05/08/2011  . Personal history of colonic polyps 05/08/2011  . Family history of malignant neoplasm of gastrointestinal tract 05/08/2011  . Other general symptoms  05/08/2011  . Iron deficiency anemia, unspecified  05/08/2011  . Diarrhea 05/08/2011  . Diverticulosis of colon (without mention of hemorrhage) 05/08/2011    Vangie Bicker, [place], OTR/L [phone]  12/21/2018, 1:09 PM  [place], [place], [address] Phone: [phone]   Fax:  [phone]  Name: Carol Wells MRN: [id number] Date of Birth: [birth year]

## 2018-12-26 ENCOUNTER — Ambulatory Visit (HOSPITAL_COMMUNITY): Payer: Medicare Other | Admitting: Specialist

## 2018-12-26 ENCOUNTER — Encounter (HOSPITAL_COMMUNITY): Payer: Self-pay | Admitting: Specialist

## 2018-12-26 ENCOUNTER — Other Ambulatory Visit: Payer: Self-pay

## 2018-12-26 DIAGNOSIS — M25611 Stiffness of right shoulder, not elsewhere classified: Secondary | ICD-10-CM | POA: Diagnosis not present

## 2018-12-26 DIAGNOSIS — M25511 Pain in right shoulder: Secondary | ICD-10-CM | POA: Diagnosis not present

## 2018-12-26 DIAGNOSIS — R29898 Other symptoms and signs involving the musculoskeletal system: Secondary | ICD-10-CM

## 2018-12-26 NOTE — Therapy (Signed)
[facility] [address] [place], [place], [address] Phone: [phone]   Fax:  [phone]  Occupational Therapy Treatment/Progress Note and Recertification Progress Note Reporting Period 11/29/18 to 12/26/18  See note below for Objective Data and Assessment of Progress/Goals.       Patient Details  Name: Carol Wells MRN: [id number] Date of Birth: 06-Jul-1931 Referring Provider (OT): Justice Britain, MD   Encounter Date: 12/26/2018  OT End of Session - 12/26/18 1349    Visit Number  16    Number of Visits  32    Date for OT Re-Evaluation  02/20/19   mini reassess on 9/21   Authorization - Visit Number  15    Authorization - Number of Visits  30    OT Start Time  1124    OT Stop Time  1215    OT Time Calculation (min)  51 min    Activity Tolerance  Patient tolerated treatment well    Behavior During Therapy  [facility] for tasks assessed/performed       Past Medical History:  Diagnosis Date  . Anemia 11/2015  . Arthritis   . Blood transfusion 2017 JULY 22  . Carotid stenosis    Dopplers 7/03: RICA 50-09%, LICA 3-81%  . Cataract    REMOVED  . Chronic headaches    HX MIGRAINES  . Collagenous colitis   . Diverticulosis 2013   Colonoscopy  . Gastric ulcer 11/2015  . Glaucoma    BOTH EYES  . History of spinal stenosis   . History of uterine fibroid   . Hx of cardiac catheterization    LHC 3/14: no angiographic CAD, EF 65%  . Hx of echocardiogram    Echocardiogram 06/21/12: Mild LVH, EF 82-99%, grade 2 diastolic dysfunction, mild MR  . Hyperlipidemia   . Hypertension   . Hypothyroidism   . Internal hemorrhoids 2009   Colonoscopy  . UTI (urinary tract infection) HX OF    Past Surgical History:  Procedure Laterality Date  . BUNIONECTOMY  YRS AGO LEFT   RIGHT CONE AT [facility] 3 YRS AGO  . BUNIONECTOMY Left   . CATARACT EXTRACTION W/ INTRAOCULAR LENS IMPLANT  03/2010   Left  . CATARACT EXTRACTION W/ INTRAOCULAR LENS IMPLANT  Bilateral 11/2010  . COLONOSCOPY  2013   NORMAL   . ESOPHAGOGASTRODUODENOSCOPY N/A 11/23/2015   Procedure: ESOPHAGOGASTRODUODENOSCOPY (EGD);  Surgeon: Rogene Houston, MD;  [facility];  Service: Endoscopy;  Laterality: N/A;  . EUS N/A 12/19/2015   Procedure: ESOPHAGEAL ENDOSCOPIC ULTRASOUND (EUS) RADIAL;  Surgeon: Milus Banister, MD;  [facility];  Service: Endoscopy;  Laterality: N/A;  . FUNCTIONAL ENDOSCOPIC SINUS SURGERY    . HAND SURGERY Right 1998  . INGUINAL HERNIA REPAIR Left 03/11/2016   Procedure: LEFT INGUINAL HERINA REPAIR WITH MESH;  Surgeon: Donnie Mesa, MD;  Location: [place];  Service: General;  Laterality: Left;  . INSERTION OF MESH Left 03/11/2016   Procedure: INSERTION OF MESH;  Surgeon: Donnie Mesa, MD;  Location: [place];  Service: General;  Laterality: Left;  . IR RADIOLOGIST EVAL & MGMT  07/01/2017  . LAMINECTOMY  03/2008  . LAPAROTOMY N/A 11/11/2017   Procedure: MINI LAPAROTOMY;  Surgeon: Everitt Amber, MD;  [facility];  Service: Gynecology;  Laterality: N/A;  . POLYPECTOMY    . REVERSE SHOULDER ARTHROPLASTY Right 10/13/2018   Procedure: REVERSE SHOULDER ARTHROPLASTY;  Surgeon: Justice Britain, MD;  [facility];  Service: Orthopedics;  Laterality: Right;  167mn  . ROBOTIC ASSISTED TOTAL HYSTERECTOMY WITH BILATERAL SALPINGO OOPHERECTOMY Bilateral 11/11/2017   Procedure: XI ROBOTIC ASSISTED TOTAL HYSTERECTOMY WITH BILATERAL SALPINGO OOPHORECTOMY FOR UTERUS GREATER THAN 250 GRAMS;  Surgeon: REveritt Amber MD;  [facility];  Service: Gynecology;  Laterality: Bilateral;  . TOTAL HIP ARTHROPLASTY  2008   Right    There were no vitals filed for this visit.  Subjective Assessment - 12/26/18 1348    Subjective   S:  I can reach the plates in my cabinet, but not the cups.  I need to keep working on my overhead reaching and reaching out in front of me.    Currently in Pain?  Yes    Pain Score  3     Pain Location  Shoulder    Pain Orientation   Right    Pain Descriptors / Indicators  Aching;Sore    Pain Type  Acute pain         [facility] OT Assessment - 12/26/18 0001      Assessment   Medical Diagnosis  Right shoulder reverse total arthroplasty    Referring Provider (OT)  KJustice Britain MD      Precautions   Precautions  Shoulder    Type of Shoulder Precautions  At MD appointment: No ROM limitations.       AROM   Overall AROM Comments  assessed in seated, external and internal rotation with shoulder adducted    AROM Assessment Site  Shoulder    Right/Left Shoulder  Right    Right Shoulder Flexion  110 Degrees    Right Shoulder ABduction  90 Degrees    Right Shoulder Internal Rotation  90 Degrees    Right Shoulder External Rotation  50 Degrees      PROM   Right Shoulder Flexion  130 Degrees   115   Right Shoulder ABduction  95 Degrees   90   Right Shoulder Internal Rotation  90 Degrees   90   Right Shoulder External Rotation  50 Degrees   35     Strength   Overall Strength Comments  assessed in seated, external and internal rotation with shoulder adducted    Strength Assessment Site  Shoulder    Right/Left Shoulder  Right    Right Shoulder Flexion  4/5    Right Shoulder ABduction  4/5    Right Shoulder Internal Rotation  4+/5    Right Shoulder External Rotation  4+/5      FOTO 63/100          OT Treatments/Exercises (OP) - 12/26/18 0001      Exercises   Exercises  Shoulder      Shoulder Exercises: Supine   Protraction  PROM;5 reps    Horizontal ABduction  PROM;5 reps    External Rotation  PROM;5 reps    Internal Rotation  PROM;5 reps    Flexion  PROM;5 reps    ABduction  PROM;5 reps      Shoulder Exercises: Standing   Protraction  AROM;12 reps    Horizontal ABduction  AROM;12 reps    External Rotation  AROM;12 reps    Internal Rotation  AROM;12 reps    Flexion  AROM;12 reps    ABduction  AROM;12 reps      Shoulder Exercises: Therapy Ball   Other Therapy Ball Exercises  stood facing wall  and rolled ball up wall from waist height to shoulder height, completed up and dow 5 reps with noted  increase in range from last session    Other Therapy Ball Exercises  standing holding large green therapy ball between hands completed chest press, overhead press, flexion 10 times each with moderate fatigue       Manual Therapy   Manual Therapy  Myofascial release    Manual therapy comments  Manual therapy completed prior to exercises    Myofascial Release  Myofascial release and manual stretching completed to right upper arm, trapezius, and scapularis region to decrease fascial restrictions and increase joint mobility in a pain free zone.   added myofascial release to upper trapezius, scm region this date.                 OT Short Term Goals - 12/26/18 1155      OT SHORT TERM GOAL #1   Title  Patient will be educated and independent with HEP to faciliate progress in therapy and allow her to return to using her RUE as her dominant extremity.    Time  4    Period  Weeks    Status  On-going    Target Date  11/29/18      OT SHORT TERM GOAL #2   Title  Patient will increase RUE P/ROM to [facility] in order to increase ability to complete UB dressing tasks with less difficulty.    Time  4    Period  Weeks    Status  Achieved      OT SHORT TERM GOAL #3   Title  Patient will increase RUE strength to 3/5 in order to complete waist level activities with more strength and stability.    Time  4    Period  Weeks    Status  Achieved      OT SHORT TERM GOAL #4   Title  Patient will decrease fascial restrictions in her RUE to mod amount or less in order to increase functional mobility needed to complete reaching tasks.    Time  4    Period  Weeks    Status  Achieved      OT SHORT TERM GOAL #5   Title  Patient will decrease pain level to approximately 5/10 during functional daily tasks.    Time  4    Period  Weeks    Status  Achieved        OT Long Term Goals - 12/26/18 1156      OT  LONG TERM GOAL #1   Title  Patient will reach her highest level of independence while using her RUE as her dominant extremity for 75% or more tasks.    Time  8    Period  Weeks    Status  On-going      OT LONG TERM GOAL #2   Title  Patient will increase her A/ROM of her RUE to [facility] in order to complete overhead reaching tasks with increased comfort and ease.    Time  8    Period  Weeks    Status  On-going      OT LONG TERM GOAL #3   Title  Patient will increase RUE strength to 4/5 in order to complete normal household lifting tasks while using her RUE.    Time  8    Period  Weeks    Status  On-going      OT LONG TERM GOAL #4   Title  Patient will decrease her RUE fascial restrictions to min amount or less in order to  increase her functional mobility needed when reaching into the overhead cabinets.    Time  8    Period  Weeks    Status  On-going      OT LONG TERM GOAL #5   Title  Pt will report a decrease in pain of approximately 2/10 or less when completing daily tasks with her RUE.    Time  8    Period  Weeks    Status  On-going            Plan - 12/26/18 1350    Clinical Impression Statement  A:  Patient is making steady progress towards her goals in occupational therapy.  She has met her short term goals and is working towards her long term goals. Patient reports increased independence with reaching to shoulder height, however overhead reaching continues to be difficult, as does reaching out in front of her.  Patient will benefit from continued skilled OT intervention to improve a/prom, strength and return to PLOF with daily tasks.    Body Structure / Function / Physical Skills  ADL;ROM;Scar mobility;IADL;Edema;Fascial restriction;Mobility;Strength;Coordination;Decreased knowledge of use of DME;Decreased knowledge of precautions;UE functional use;Pain    OT Frequency  2x / week    OT Duration  8 weeks    OT Treatment/Interventions  Self-care/ADL training;Therapeutic  exercise;Manual Therapy;Neuromuscular education;Ultrasound;Therapeutic activities;DME and/or AE instruction;Cryotherapy;Electrical Stimulation;Scar mobilization;Patient/family education;Passive range of motion;Moist Heat    Plan  P:  continue therapy ball exercises for greater overhead reach, proximal shoulder strengthening, and pinch tree for improved available reach.       Patient will benefit from skilled therapeutic intervention in order to improve the following deficits and impairments:   Body Structure / Function / Physical Skills: ADL, ROM, Scar mobility, IADL, Edema, Fascial restriction, Mobility, Strength, Coordination, Decreased knowledge of use of DME, Decreased knowledge of precautions, UE functional use, Pain       Visit Diagnosis: Acute pain of right shoulder  Stiffness of right shoulder, not elsewhere classified  Other symptoms and signs involving the musculoskeletal system    Problem List Patient Active Problem List   Diagnosis Date Noted  . S/P reverse total shoulder arthroplasty, right 10/13/2018  . Fibroid uterus 11/11/2017  . Fibroids 11/03/2017  . Acute blood loss anemia 11/22/2015  . Colitis 11/22/2015  . UGI bleed   . Melena   . History of Helicobacter pylori infection   . Other fatigue   . Weakness   . Collagenous colitis 07/16/2011  . Chronic diarrhea 06/09/2011  . History of peptic ulcer disease 05/08/2011  . Personal history of colonic polyps 05/08/2011  . Family history of malignant neoplasm of gastrointestinal tract 05/08/2011  . Other general symptoms  05/08/2011  . Iron deficiency anemia, unspecified  05/08/2011  . Diarrhea 05/08/2011  . Diverticulosis of colon (without mention of hemorrhage) 05/08/2011    Vangie Bicker, [place], OTR/L [phone]  12/26/2018, 2:00 PM  [facility] [address] [place], [place], [address] Phone: [phone]   Fax:  [phone]  Name: Carol Wells MRN:  [id number] Date of Birth: 1932/01/12

## 2018-12-28 ENCOUNTER — Ambulatory Visit (HOSPITAL_COMMUNITY): Payer: Medicare Other

## 2018-12-28 ENCOUNTER — Encounter (HOSPITAL_COMMUNITY): Payer: Self-pay

## 2018-12-28 ENCOUNTER — Other Ambulatory Visit: Payer: Self-pay

## 2018-12-28 DIAGNOSIS — R29898 Other symptoms and signs involving the musculoskeletal system: Secondary | ICD-10-CM

## 2018-12-28 DIAGNOSIS — M25511 Pain in right shoulder: Secondary | ICD-10-CM | POA: Diagnosis not present

## 2018-12-28 DIAGNOSIS — M25611 Stiffness of right shoulder, not elsewhere classified: Secondary | ICD-10-CM

## 2018-12-28 NOTE — Therapy (Signed)
[place], [place], [address] Phone: [phone]   Fax:  [phone]  Occupational Therapy Treatment  Patient Details  Name: Carol Wells MRN: [id number] Date of Birth: 08/05/1931 Referring Provider (OT): Justice Britain, MD   Encounter Date: 12/28/2018  OT End of Session - 12/28/18 1136    Visit Number  17    Number of Visits  32    Date for OT Re-Evaluation  02/20/19   mini reassess on 9/21   Authorization Type  1) Medicare 80/20% 2) BC[employer] $10 copay with 30 visit limit. 0 used.    Authorization Time Period  see above    Authorization - Visit Number  16    Authorization - Number of Visits  30    OT Start Time  [id number]    OT Stop Time  1115    OT Time Calculation (min)  42 min    Activity Tolerance  Patient tolerated treatment well    Behavior During Therapy  WFL for tasks assessed/performed       Past Medical History:  Diagnosis Date  . Anemia 11/2015  . Arthritis   . Blood transfusion 2017 JULY 22  . Carotid stenosis    Dopplers [id number]: RICA 123456, LICA [id number]  . Cataract    REMOVED  . Chronic headaches    HX MIGRAINES  . Collagenous colitis   . Diverticulosis 2013   Colonoscopy  . Gastric ulcer 11/2015  . Glaucoma    BOTH EYES  . History of spinal stenosis   . History of uterine fibroid   . Hx of cardiac catheterization    LHC 3/14: no angiographic CAD, EF 65%  . Hx of echocardiogram    Echocardiogram 06/21/12: Mild LVH, EF 123456, grade 2 diastolic dysfunction, mild MR  . Hyperlipidemia   . Hypertension   . Hypothyroidism   . Internal hemorrhoids 2009   Colonoscopy  . UTI (urinary tract infection) HX OF    Past Surgical History:  Procedure Laterality Date  . BUNIONECTOMY  YRS AGO LEFT   RIGHT CONE AT [facility] 3 YRS AGO  . BUNIONECTOMY Left   . CATARACT EXTRACTION W/ INTRAOCULAR LENS IMPLANT  03/2010   Left  . CATARACT EXTRACTION W/ INTRAOCULAR LENS IMPLANT Bilateral 11/2010  . COLONOSCOPY   2013   NORMAL   . ESOPHAGOGASTRODUODENOSCOPY N/A 11/23/2015   Procedure: ESOPHAGOGASTRODUODENOSCOPY (EGD);  Surgeon: Rogene Houston, MD;  [facility];  Service: Endoscopy;  Laterality: N/A;  . EUS N/A 12/19/2015   Procedure: ESOPHAGEAL ENDOSCOPIC ULTRASOUND (EUS) RADIAL;  Surgeon: Milus Banister, MD;  [facility];  Service: Endoscopy;  Laterality: N/A;  . FUNCTIONAL ENDOSCOPIC SINUS SURGERY    . HAND SURGERY Right 1998  . INGUINAL HERNIA REPAIR Left 03/11/2016   Procedure: LEFT INGUINAL HERINA REPAIR WITH MESH;  Surgeon: Donnie Mesa, MD;  Location: [facility];  Service: General;  Laterality: Left;  . INSERTION OF MESH Left 03/11/2016   Procedure: INSERTION OF MESH;  Surgeon: Donnie Mesa, MD;  Location: [facility];  Service: General;  Laterality: Left;  . IR RADIOLOGIST EVAL & MGMT  07/01/2017  . LAMINECTOMY  03/2008  . LAPAROTOMY N/A 11/11/2017   Procedure: MINI LAPAROTOMY;  Surgeon: Everitt Amber, MD;  [facility];  Service: Gynecology;  Laterality: N/A;  . POLYPECTOMY    . REVERSE SHOULDER ARTHROPLASTY Right 10/13/2018   Procedure: REVERSE SHOULDER ARTHROPLASTY;  Surgeon: Justice Britain, MD;  [facility];  Service: Orthopedics;  Laterality: Right;  182min  . ROBOTIC ASSISTED TOTAL HYSTERECTOMY WITH BILATERAL SALPINGO OOPHERECTOMY Bilateral 11/11/2017   Procedure: XI ROBOTIC ASSISTED TOTAL HYSTERECTOMY WITH BILATERAL SALPINGO OOPHORECTOMY FOR UTERUS GREATER THAN 250 GRAMS;  Surgeon: Everitt Amber, MD;  [facility];  Service: Gynecology;  Laterality: Bilateral;  . TOTAL HIP ARTHROPLASTY  2008   Right    There were no vitals filed for this visit.  Subjective Assessment - 12/28/18 1053    Subjective   S: I want to be able to unclasp and clasp my bra, and fix my hair.    Currently in Pain?  Yes    Pain Score  2     Pain Location  Shoulder    Pain Orientation  Right    Pain Descriptors / Indicators  Aching;Sore    Pain Type  Acute pain    Pain Radiating Towards   N/A    Pain Onset  1 to 4 weeks ago    Pain Frequency  Intermittent    Aggravating Factors   increased movement and use    Pain Relieving Factors  wait it out. Massage helps at therapy.    Effect of Pain on Daily Activities  No effect    Multiple Pain Sites  No         [facility] OT Assessment - 12/28/18 1055      Assessment   Medical Diagnosis  Right shoulder reverse total arthroplasty      Precautions   Precautions  Shoulder    Type of Shoulder Precautions  At MD appointment: No ROM limitations.                OT Treatments/Exercises (OP) - 12/28/18 1055      Exercises   Exercises  Shoulder      Shoulder Exercises: Supine   Protraction  PROM;5 reps    Horizontal ABduction  PROM;5 reps    External Rotation  PROM;5 reps    Internal Rotation  PROM;5 reps    Flexion  PROM;5 reps    ABduction  PROM;5 reps      Shoulder Exercises: Therapy Ball   Other Therapy Ball Exercises  stood facing wall and rolled ball up wall from waist height to shoulder height, completed up and dow 5 reps with noted increase in range from last session    Other Therapy Ball Exercises  standing holding large green therapy ball between hands completed chest press, overhead press, flexion 10 times each with moderate fatigue       Shoulder Exercises: ROM/Strengthening   Proximal Shoulder Strengthening, Seated  12X with A/ROM, no rest breaks      Functional Reaching Activities   Low Level  FInger ladder completed with patient able to reach #9. Completed 5 times up/down    High Level  Pt completed high level functional reaching task while seated with resistive clothespins. Patient placed pins on vertical pole starting at bottom and continuing up while reaching 30 cm      Manual Therapy   Manual Therapy  Myofascial release    Manual therapy comments  Manual therapy completed prior to exercises    Myofascial Release  Myofascial release and manual stretching completed to right upper arm, trapezius, and  scapularis region to decrease fascial restrictions and increase joint mobility in a pain free zone.   added myofascial release to upper trapezius, scm region this date.  OT Short Term Goals - 12/28/18 1139      OT SHORT TERM GOAL #1   Title  Patient will be educated and independent with HEP to faciliate progress in therapy and allow her to return to using her RUE as her dominant extremity.    Time  4    Period  Weeks    Status  On-going    Target Date  11/29/18      OT SHORT TERM GOAL #2   Title  Patient will increase RUE P/ROM to [facility] in order to increase ability to complete UB dressing tasks with less difficulty.    Time  4    Period  Weeks      OT SHORT TERM GOAL #3   Title  Patient will increase RUE strength to 3/5 in order to complete waist level activities with more strength and stability.    Time  4    Period  Weeks      OT SHORT TERM GOAL #4   Title  Patient will decrease fascial restrictions in her RUE to mod amount or less in order to increase functional mobility needed to complete reaching tasks.    Time  4    Period  Weeks      OT SHORT TERM GOAL #5   Title  Patient will decrease pain level to approximately 5/10 during functional daily tasks.    Time  4    Period  Weeks        OT Long Term Goals - 12/28/18 1139      OT LONG TERM GOAL #1   Title  Patient will reach her highest level of independence while using her RUE as her dominant extremity for 75% or more tasks.    Time  8    Period  Weeks    Status  On-going      OT LONG TERM GOAL #2   Title  Patient will increase her A/ROM of her RUE to [facility] in order to complete overhead reaching tasks with increased comfort and ease.    Time  8    Period  Weeks    Status  On-going      OT LONG TERM GOAL #3   Title  Patient will increase RUE strength to 4/5 in order to complete normal household lifting tasks while using her RUE.    Time  8    Period  Weeks    Status  On-going      OT LONG  TERM GOAL #4   Title  Patient will decrease her RUE fascial restrictions to min amount or less in order to increase her functional mobility needed when reaching into the overhead cabinets.    Time  8    Period  Weeks    Status  On-going      OT LONG TERM GOAL #5   Title  Pt will report a decrease in pain of approximately 2/10 or less when completing daily tasks with her RUE.    Time  8    Period  Weeks    Status  On-going            Plan - 12/28/18 1137    Clinical Impression Statement  A: Required VC for form and technique related to keeping her pelvis nuetral versus demonstrating a posterior pelvic tilt during diffiult exercises. Pt presents with min-mod fascial restrictions in the upper trapezius with manual techniques completed to address. Educated patient on use of cane with  curved handle to complete a trigger point release at home. Pt verbalized understanding.    Body Structure / Function / Physical Skills  ADL;ROM;Scar mobility;IADL;Edema;Fascial restriction;Mobility;Strength;Coordination;Decreased knowledge of use of DME;Decreased knowledge of precautions;UE functional use;Pain    Plan  P: COntinue to work on proximal shoulder strengthening, use therapy ball, and continue with improving available reaching ability.    Consulted and Agree with Plan of Care  Patient       Patient will benefit from skilled therapeutic intervention in order to improve the following deficits and impairments:   Body Structure / Function / Physical Skills: ADL, ROM, Scar mobility, IADL, Edema, Fascial restriction, Mobility, Strength, Coordination, Decreased knowledge of use of DME, Decreased knowledge of precautions, UE functional use, Pain       Visit Diagnosis: Stiffness of right shoulder, not elsewhere classified  Acute pain of right shoulder  Other symptoms and signs involving the musculoskeletal system    Problem List Patient Active Problem List   Diagnosis Date Noted  . S/P reverse  total shoulder arthroplasty, right 10/13/2018  . Fibroid uterus 11/11/2017  . Fibroids 11/03/2017  . Acute blood loss anemia 11/22/2015  . Colitis 11/22/2015  . UGI bleed   . Melena   . History of Helicobacter pylori infection   . Other fatigue   . Weakness   . Collagenous colitis 07/16/2011  . Chronic diarrhea 06/09/2011  . History of peptic ulcer disease 05/08/2011  . Personal history of colonic polyps 05/08/2011  . Family history of malignant neoplasm of gastrointestinal tract 05/08/2011  . Other general symptoms  05/08/2011  . Iron deficiency anemia, unspecified  05/08/2011  . Diarrhea 05/08/2011  . Diverticulosis of colon (without mention of hemorrhage) 05/08/2011   Ailene Ravel, OTR/L,CBIS  [phone]  12/28/2018, 11:40 AM  [place], [place], [address] Phone: [phone]   Fax:  [phone]  Name: Carol Wells MRN: [id number] Date of Birth: 1932-03-20

## 2019-01-02 ENCOUNTER — Ambulatory Visit (HOSPITAL_COMMUNITY): Payer: Medicare Other

## 2019-01-02 ENCOUNTER — Other Ambulatory Visit: Payer: Self-pay

## 2019-01-02 ENCOUNTER — Encounter (HOSPITAL_COMMUNITY): Payer: Medicare Other

## 2019-01-02 ENCOUNTER — Encounter (HOSPITAL_COMMUNITY): Payer: Self-pay

## 2019-01-02 DIAGNOSIS — M25611 Stiffness of right shoulder, not elsewhere classified: Secondary | ICD-10-CM

## 2019-01-02 DIAGNOSIS — R29898 Other symptoms and signs involving the musculoskeletal system: Secondary | ICD-10-CM

## 2019-01-02 DIAGNOSIS — M25511 Pain in right shoulder: Secondary | ICD-10-CM

## 2019-01-02 NOTE — Therapy (Signed)
[place], [place], [address] Phone: [phone]   Fax:  [phone]  Occupational Therapy Treatment  Patient Details  Name: Carol Wells MRN: [id number] Date of Birth: 12/07/1931 Referring Provider (OT): Justice Britain, MD   Encounter Date: 01/02/2019  OT End of Session - 01/02/19 1147    Visit Number  17    Number of Visits  32    Date for OT Re-Evaluation  02/20/19   mini reassess on 9/21   Authorization Type  1) Medicare 80/20% 2) BC[employer] $10 copay with 30 visit limit. 0 used.    Authorization Time Period  see above    Authorization - Visit Number  17    Authorization - Number of Visits  30    OT Start Time  1040    OT Stop Time  1120    OT Time Calculation (min)  40 min    Activity Tolerance  Patient tolerated treatment well    Behavior During Therapy  WFL for tasks assessed/performed       Past Medical History:  Diagnosis Date  . Anemia 11/2015  . Arthritis   . Blood transfusion 2017 JULY 22  . Carotid stenosis    Dopplers [id number]: RICA 123456, LICA [id number]  . Cataract    REMOVED  . Chronic headaches    HX MIGRAINES  . Collagenous colitis   . Diverticulosis 2013   Colonoscopy  . Gastric ulcer 11/2015  . Glaucoma    BOTH EYES  . History of spinal stenosis   . History of uterine fibroid   . Hx of cardiac catheterization    LHC 3/14: no angiographic CAD, EF 65%  . Hx of echocardiogram    Echocardiogram 06/21/12: Mild LVH, EF 123456, grade 2 diastolic dysfunction, mild MR  . Hyperlipidemia   . Hypertension   . Hypothyroidism   . Internal hemorrhoids 2009   Colonoscopy  . UTI (urinary tract infection) HX OF    Past Surgical History:  Procedure Laterality Date  . BUNIONECTOMY  YRS AGO LEFT   RIGHT CONE AT [facility] 3 YRS AGO  . BUNIONECTOMY Left   . CATARACT EXTRACTION W/ INTRAOCULAR LENS IMPLANT  03/2010   Left  . CATARACT EXTRACTION W/ INTRAOCULAR LENS IMPLANT Bilateral 11/2010  . COLONOSCOPY   2013   NORMAL   . ESOPHAGOGASTRODUODENOSCOPY N/A 11/23/2015   Procedure: ESOPHAGOGASTRODUODENOSCOPY (EGD);  Surgeon: Rogene Houston, MD;  [facility];  Service: Endoscopy;  Laterality: N/A;  . EUS N/A 12/19/2015   Procedure: ESOPHAGEAL ENDOSCOPIC ULTRASOUND (EUS) RADIAL;  Surgeon: Milus Banister, MD;  [facility];  Service: Endoscopy;  Laterality: N/A;  . FUNCTIONAL ENDOSCOPIC SINUS SURGERY    . HAND SURGERY Right 1998  . INGUINAL HERNIA REPAIR Left 03/11/2016   Procedure: LEFT INGUINAL HERINA REPAIR WITH MESH;  Surgeon: Donnie Mesa, MD;  Location: [place];  Service: General;  Laterality: Left;  . INSERTION OF MESH Left 03/11/2016   Procedure: INSERTION OF MESH;  Surgeon: Donnie Mesa, MD;  Location: [place];  Service: General;  Laterality: Left;  . IR RADIOLOGIST EVAL & MGMT  07/01/2017  . LAMINECTOMY  03/2008  . LAPAROTOMY N/A 11/11/2017   Procedure: MINI LAPAROTOMY;  Surgeon: Everitt Amber, MD;  [facility];  Service: Gynecology;  Laterality: N/A;  . POLYPECTOMY    . REVERSE SHOULDER ARTHROPLASTY Right 10/13/2018   Procedure: REVERSE SHOULDER ARTHROPLASTY;  Surgeon: Justice Britain, MD;  [facility];  Service: Orthopedics;  Laterality: Right;  164min  . ROBOTIC ASSISTED TOTAL HYSTERECTOMY WITH BILATERAL SALPINGO OOPHERECTOMY Bilateral 11/11/2017   Procedure: XI ROBOTIC ASSISTED TOTAL HYSTERECTOMY WITH BILATERAL SALPINGO OOPHORECTOMY FOR UTERUS GREATER THAN 250 GRAMS;  Surgeon: Everitt Amber, MD;  [facility];  Service: Gynecology;  Laterality: Bilateral;  . TOTAL HIP ARTHROPLASTY  2008   Right    There were no vitals filed for this visit.  Subjective Assessment - 01/02/19 1059    Subjective   S: I just feel stiff this morning. I really look forward to my hot showers so I can warm it up.    Currently in Pain?  No/denies         [facility] OT Assessment - 01/02/19 1100      Assessment   Medical Diagnosis  Right shoulder reverse total arthroplasty       Precautions   Precautions  Shoulder    Type of Shoulder Precautions  At MD appointment: No ROM limitations.                OT Treatments/Exercises (OP) - 01/02/19 1100      Exercises   Exercises  Shoulder      Shoulder Exercises: Supine   Protraction  PROM;5 reps    Horizontal ABduction  PROM;5 reps    External Rotation  PROM;5 reps    Internal Rotation  PROM;5 reps    Flexion  PROM;5 reps    ABduction  PROM;5 reps      Shoulder Exercises: Therapy Ball   Other Therapy Ball Exercises  stood facing wall and rolled ball up wall from waist height to shoulder height, completed up and dow 8 reps     Other Therapy Ball Exercises  standing holding large green therapy ball between hands completed chest press, overhead press, flexion 10 times each with fatigue noted       Shoulder Exercises: ROM/Strengthening   Over Head Lace  2' seated    X to V Arms  10X V to T arms    Ball on Wall  1' flexion      Functional Reaching Activities   Mid Level  Pleased 10 cones on second shelf this date to focus on functional reaching ability with Max difficulty for completion.                OT Short Term Goals - 12/28/18 1139      OT SHORT TERM GOAL #1   Title  Patient will be educated and independent with HEP to faciliate progress in therapy and allow her to return to using her RUE as her dominant extremity.    Time  4    Period  Weeks    Status  On-going    Target Date  11/29/18      OT SHORT TERM GOAL #2   Title  Patient will increase RUE P/ROM to [facility] in order to increase ability to complete UB dressing tasks with less difficulty.    Time  4    Period  Weeks      OT SHORT TERM GOAL #3   Title  Patient will increase RUE strength to 3/5 in order to complete waist level activities with more strength and stability.    Time  4    Period  Weeks      OT SHORT TERM GOAL #4   Title  Patient will decrease fascial restrictions in her RUE to mod amount or less in order to increase  functional mobility needed to complete reaching tasks.    Time  4    Period  Weeks      OT SHORT TERM GOAL #5   Title  Patient will decrease pain level to approximately 5/10 during functional daily tasks.    Time  4    Period  Weeks        OT Long Term Goals - 12/28/18 1139      OT LONG TERM GOAL #1   Title  Patient will reach her highest level of independence while using her RUE as her dominant extremity for 75% or more tasks.    Time  8    Period  Weeks    Status  On-going      OT LONG TERM GOAL #2   Title  Patient will increase her A/ROM of her RUE to [facility] in order to complete overhead reaching tasks with increased comfort and ease.    Time  8    Period  Weeks    Status  On-going      OT LONG TERM GOAL #3   Title  Patient will increase RUE strength to 4/5 in order to complete normal household lifting tasks while using her RUE.    Time  8    Period  Weeks    Status  On-going      OT LONG TERM GOAL #4   Title  Patient will decrease her RUE fascial restrictions to min amount or less in order to increase her functional mobility needed when reaching into the overhead cabinets.    Time  8    Period  Weeks    Status  On-going      OT LONG TERM GOAL #5   Title  Pt will report a decrease in pain of approximately 2/10 or less when completing daily tasks with her RUE.    Time  8    Period  Weeks    Status  On-going            Plan - 01/02/19 1147    Clinical Impression Statement  A: pt reports that she sees the MD for a follow up on Wednesday. Measurements from appointment on 12/26/2018 will be sent to MD. Focused on functional reaching using therapy ball and cones. Pt was able to place cones on second shelf this session for the first time. VC for form and technique were provided. She did not present with any trigger points in her right upper trapezius region. Pt reports that she has been using her Father in law's cane with a curved handle to address at home.    Body  Structure / Function / Physical Skills  ADL;ROM;Scar mobility;IADL;Edema;Fascial restriction;Mobility;Strength;Coordination;Decreased knowledge of use of DME;Decreased knowledge of precautions;UE functional use;Pain    Plan  P: Follow up on MD appointment. Continue to focus on reaching ability and shoulder/scapular stability. Complete scapular strengthening with red theraband.    Consulted and Agree with Plan of Care  Patient       Patient will benefit from skilled therapeutic intervention in order to improve the following deficits and impairments:   Body Structure / Function / Physical Skills: ADL, ROM, Scar mobility, IADL, Edema, Fascial restriction, Mobility, Strength, Coordination, Decreased knowledge of use of DME, Decreased knowledge of precautions, UE functional use, Pain       Visit Diagnosis: Other symptoms and signs involving the musculoskeletal system  Acute pain of right shoulder  Stiffness of right shoulder, not elsewhere classified  Problem List Patient Active Problem List   Diagnosis Date Noted  . S/P reverse total shoulder arthroplasty, right 10/13/2018  . Fibroid uterus 11/11/2017  . Fibroids 11/03/2017  . Acute blood loss anemia 11/22/2015  . Colitis 11/22/2015  . UGI bleed   . Melena   . History of Helicobacter pylori infection   . Other fatigue   . Weakness   . Collagenous colitis 07/16/2011  . Chronic diarrhea 06/09/2011  . History of peptic ulcer disease 05/08/2011  . Personal history of colonic polyps 05/08/2011  . Family history of malignant neoplasm of gastrointestinal tract 05/08/2011  . Other general symptoms  05/08/2011  . Iron deficiency anemia, unspecified  05/08/2011  . Diarrhea 05/08/2011  . Diverticulosis of colon (without mention of hemorrhage) 05/08/2011   Ailene Ravel, OTR/L,CBIS  [phone]  01/02/2019, 11:52 AM  [place] [address] [place], [place], [address] Phone:  [phone]   Fax:  [phone]  Name: Carol Wells MRN: [id number] Date of Birth: 07-22-1931

## 2019-01-04 DIAGNOSIS — Z96611 Presence of right artificial shoulder joint: Secondary | ICD-10-CM | POA: Diagnosis not present

## 2019-01-04 DIAGNOSIS — Z471 Aftercare following joint replacement surgery: Secondary | ICD-10-CM | POA: Diagnosis not present

## 2019-01-05 ENCOUNTER — Other Ambulatory Visit: Payer: Self-pay

## 2019-01-05 ENCOUNTER — Ambulatory Visit (HOSPITAL_COMMUNITY): Payer: Medicare Other | Attending: Orthopedic Surgery

## 2019-01-05 DIAGNOSIS — R29898 Other symptoms and signs involving the musculoskeletal system: Secondary | ICD-10-CM

## 2019-01-05 DIAGNOSIS — M25511 Pain in right shoulder: Secondary | ICD-10-CM

## 2019-01-05 DIAGNOSIS — M25611 Stiffness of right shoulder, not elsewhere classified: Secondary | ICD-10-CM | POA: Diagnosis not present

## 2019-01-05 NOTE — Patient Instructions (Addendum)
External Rotator Cuff Stretch, Supine    Lie supine, fingers clasped behind head, elbows close together. Pull elbows backward while pinching shoulder blades. Hold _30__ seconds. Repeat _2__ times per session. Do _2__ sessions per day.  Copyright  VHI. All rights reserved.   Ball pass-around  Take a ball and pass it around the back of your waist from one hand into the other 10 times.   Repeat passing the ball around your head also 10 times.     EXTERNAL ROTATION STRETCH  Place arm along edge of door as pictured.  Keep elbow and shoulder at 90 degree angles.  Gently lean the upper torso forward until a stretch is felt in the shoulder. Hold _30__ seconds; relax; repeat

## 2019-01-05 NOTE — Therapy (Signed)
[place], [place], [address] Phone: [phone]   Fax:  [phone]  Occupational Therapy Treatment  Patient Details  Name: Carol Wells MRN: [id number] Date of Birth: 1931/06/03 Referring Provider (OT): Justice Britain, MD   Encounter Date: 01/05/2019  OT End of Session - 01/05/19 1448    Visit Number  18    Number of Visits  32    Date for OT Re-Evaluation  02/20/19   mini reassess on 9/21   Authorization Type  1) Medicare 80/20% 2) [employer] $10 copay with 30 visit limit. 0 used.    Authorization Time Period  see above    Authorization - Visit Number  18    Authorization - Number of Visits  30    OT Start Time  [id number]    OT Stop Time  1423    OT Time Calculation (min)  38 min    Activity Tolerance  Patient tolerated treatment well    Behavior During Therapy  WFL for tasks assessed/performed       Past Medical History:  Diagnosis Date  . Anemia 11/2015  . Arthritis   . Blood transfusion 2017 JULY 22  . Carotid stenosis    Dopplers [id number]: RICA 123456, LICA [id number]  . Cataract    REMOVED  . Chronic headaches    HX MIGRAINES  . Collagenous colitis   . Diverticulosis 2013   Colonoscopy  . Gastric ulcer 11/2015  . Glaucoma    BOTH EYES  . History of spinal stenosis   . History of uterine fibroid   . Hx of cardiac catheterization    LHC 3/14: no angiographic CAD, EF 65%  . Hx of echocardiogram    Echocardiogram 06/21/12: Mild LVH, EF 123456, grade 2 diastolic dysfunction, mild MR  . Hyperlipidemia   . Hypertension   . Hypothyroidism   . Internal hemorrhoids 2009   Colonoscopy  . UTI (urinary tract infection) HX OF    Past Surgical History:  Procedure Laterality Date  . BUNIONECTOMY  YRS AGO LEFT   RIGHT [facility] AT [facility] 3 YRS AGO  . BUNIONECTOMY Left   . CATARACT EXTRACTION W/ INTRAOCULAR LENS IMPLANT  03/2010   Left  . CATARACT EXTRACTION W/ INTRAOCULAR LENS IMPLANT Bilateral 11/2010  . COLONOSCOPY   2013   NORMAL   . ESOPHAGOGASTRODUODENOSCOPY N/A 11/23/2015   Procedure: ESOPHAGOGASTRODUODENOSCOPY (EGD);  Surgeon: Rogene Houston, MD;  [facility];  Service: Endoscopy;  Laterality: N/A;  . EUS N/A 12/19/2015   Procedure: ESOPHAGEAL ENDOSCOPIC ULTRASOUND (EUS) RADIAL;  Surgeon: Milus Banister, MD;  [facility];  Service: Endoscopy;  Laterality: N/A;  . FUNCTIONAL ENDOSCOPIC SINUS SURGERY    . HAND SURGERY Right 1998  . INGUINAL HERNIA REPAIR Left 03/11/2016   Procedure: LEFT INGUINAL HERINA REPAIR WITH MESH;  Surgeon: Donnie Mesa, MD;  Location: [facility];  Service: General;  Laterality: Left;  . INSERTION OF MESH Left 03/11/2016   Procedure: INSERTION OF MESH;  Surgeon: Donnie Mesa, MD;  Location: [facility];  Service: General;  Laterality: Left;  . IR RADIOLOGIST EVAL & MGMT  07/01/2017  . LAMINECTOMY  03/2008  . LAPAROTOMY N/A 11/11/2017   Procedure: MINI LAPAROTOMY;  Surgeon: Everitt Amber, MD;  [facility];  Service: Gynecology;  Laterality: N/A;  . POLYPECTOMY    . REVERSE SHOULDER ARTHROPLASTY Right 10/13/2018   Procedure: REVERSE SHOULDER ARTHROPLASTY;  Surgeon: Justice Britain, MD;  [facility];  Service: Orthopedics;  Laterality: Right;  156min  . ROBOTIC ASSISTED TOTAL HYSTERECTOMY WITH BILATERAL SALPINGO OOPHERECTOMY Bilateral 11/11/2017   Procedure: XI ROBOTIC ASSISTED TOTAL HYSTERECTOMY WITH BILATERAL SALPINGO OOPHORECTOMY FOR UTERUS GREATER THAN 250 GRAMS;  Surgeon: Everitt Amber, MD;  [facility];  Service: Gynecology;  Laterality: Bilateral;  . TOTAL HIP ARTHROPLASTY  2008   Right    There were no vitals filed for this visit.  Subjective Assessment - 01/05/19 1402    Subjective   S: The PA and the Doctor were pleased with my progress.    Currently in Pain?  No/denies         [facility] OT Assessment - 01/05/19 1400      Assessment   Medical Diagnosis  Right shoulder reverse total arthroplasty      Precautions   Precautions  Shoulder    Type  of Shoulder Precautions  Progress as tolerated               OT Treatments/Exercises (OP) - 01/05/19 1400      Exercises   Exercises  Shoulder      Shoulder Exercises: Supine   Protraction  PROM;5 reps    Horizontal ABduction  PROM;5 reps    External Rotation  PROM;5 reps    Internal Rotation  PROM;5 reps    Flexion  PROM;5 reps    ABduction  PROM;5 reps      Shoulder Exercises: Standing   Extension  Theraband;10 reps    Theraband Level (Shoulder Extension)  Level 2 (Red)    Row  Theraband;10 reps    Theraband Level (Shoulder Row)  Level 2 (Red)    Retraction  Theraband;10 reps    Theraband Level (Shoulder Retraction)  Level 2 (Red)    Other Standing Exercises  Ball pass around waist 10 times. Ball pass around neck 10 times. green weighted ball      Shoulder Exercises: Therapy Ball   Right/Left  5 reps      Shoulder Exercises: ROM/Strengthening   Over Head Lace  2' seated    Ball on Wall  1' flexion 1' scaption       Shoulder Exercises: Stretch   Star Gazer Stretch  2 reps;30 seconds             OT Education - 01/05/19 1358    Education Details  star gazer stretch, ball pass behind back and head, external rotation in door    Person(s) Educated  Patient    Methods  Explanation;Demonstration;Handout;Verbal cues    Comprehension  Returned demonstration;Verbalized understanding       OT Short Term Goals - 12/28/18 1139      OT SHORT TERM GOAL #1   Title  Patient will be educated and independent with HEP to faciliate progress in therapy and allow her to return to using her RUE as her dominant extremity.    Time  4    Period  Weeks    Status  On-going    Target Date  11/29/18      OT SHORT TERM GOAL #2   Title  Patient will increase RUE P/ROM to [facility] in order to increase ability to complete UB dressing tasks with less difficulty.    Time  4    Period  Weeks      OT SHORT TERM GOAL #3   Title  Patient will increase RUE strength to 3/5 in order to  complete waist level activities with more strength and stability.  Time  4    Period  Weeks      OT SHORT TERM GOAL #4   Title  Patient will decrease fascial restrictions in her RUE to mod amount or less in order to increase functional mobility needed to complete reaching tasks.    Time  4    Period  Weeks      OT SHORT TERM GOAL #5   Title  Patient will decrease pain level to approximately 5/10 during functional daily tasks.    Time  4    Period  Weeks        OT Long Term Goals - 12/28/18 1139      OT LONG TERM GOAL #1   Title  Patient will reach her highest level of independence while using her RUE as her dominant extremity for 75% or more tasks.    Time  8    Period  Weeks    Status  On-going      OT LONG TERM GOAL #2   Title  Patient will increase her A/ROM of her RUE to [facility] in order to complete overhead reaching tasks with increased comfort and ease.    Time  8    Period  Weeks    Status  On-going      OT LONG TERM GOAL #3   Title  Patient will increase RUE strength to 4/5 in order to complete normal household lifting tasks while using her RUE.    Time  8    Period  Weeks    Status  On-going      OT LONG TERM GOAL #4   Title  Patient will decrease her RUE fascial restrictions to min amount or less in order to increase her functional mobility needed when reaching into the overhead cabinets.    Time  8    Period  Weeks    Status  On-going      OT LONG TERM GOAL #5   Title  Pt will report a decrease in pain of approximately 2/10 or less when completing daily tasks with her RUE.    Time  8    Period  Weeks    Status  On-going            Plan - 01/05/19 1449    Clinical Impression Statement  A: Session focused on increasing ROM for internal and external rotation. Added various exercises during treatment session and provided for HEP as well. VC for form and technique. Reviewed external rotation stretch on doorframe per patient's request.    Body Structure /  Function / Physical Skills  ADL;ROM;Scar mobility;IADL;Edema;Fascial restriction;Mobility;Strength;Coordination;Decreased knowledge of use of DME;Decreased knowledge of precautions;UE functional use;Pain    Plan  P: Continue to work on internal and external rotation in order to increase functional performance when fixing hair and completing dressing tasks.       Patient will benefit from skilled therapeutic intervention in order to improve the following deficits and impairments:   Body Structure / Function / Physical Skills: ADL, ROM, Scar mobility, IADL, Edema, Fascial restriction, Mobility, Strength, Coordination, Decreased knowledge of use of DME, Decreased knowledge of precautions, UE functional use, Pain       Visit Diagnosis: Other symptoms and signs involving the musculoskeletal system  Acute pain of right shoulder  Stiffness of right shoulder, not elsewhere classified    Problem List Patient Active Problem List   Diagnosis Date Noted  . S/P reverse total shoulder arthroplasty, right 10/13/2018  .  Fibroid uterus 11/11/2017  . Fibroids 11/03/2017  . Acute blood loss anemia 11/22/2015  . Colitis 11/22/2015  . UGI bleed   . Melena   . History of Helicobacter pylori infection   . Other fatigue   . Weakness   . Collagenous colitis 07/16/2011  . Chronic diarrhea 06/09/2011  . History of peptic ulcer disease 05/08/2011  . Personal history of colonic polyps 05/08/2011  . Family history of malignant neoplasm of gastrointestinal tract 05/08/2011  . Other general symptoms  05/08/2011  . Iron deficiency anemia, unspecified  05/08/2011  . Diarrhea 05/08/2011  . Diverticulosis of colon (without mention of hemorrhage) 05/08/2011   Ailene Ravel, OTR/L,CBIS  [phone]  01/05/2019, 2:54 PM  [facility] [address] [place], [place], [address] Phone: [phone]   Fax:  [phone]  Name: SUZANNA HALLEY MRN: [id number] Date of  Birth: [birth year]

## 2019-01-13 ENCOUNTER — Ambulatory Visit (HOSPITAL_COMMUNITY): Payer: Medicare Other

## 2019-01-13 ENCOUNTER — Other Ambulatory Visit: Payer: Self-pay

## 2019-01-13 ENCOUNTER — Encounter (HOSPITAL_COMMUNITY): Payer: Self-pay

## 2019-01-13 DIAGNOSIS — M25511 Pain in right shoulder: Secondary | ICD-10-CM | POA: Diagnosis not present

## 2019-01-13 DIAGNOSIS — R29898 Other symptoms and signs involving the musculoskeletal system: Secondary | ICD-10-CM | POA: Diagnosis not present

## 2019-01-13 DIAGNOSIS — M25611 Stiffness of right shoulder, not elsewhere classified: Secondary | ICD-10-CM | POA: Diagnosis not present

## 2019-01-13 NOTE — Therapy (Signed)
[place], [place], [address] Phone: [phone]   Fax:  [phone]  Occupational Therapy Treatment  Patient Details  Name: Carol Wells MRN: [id number] Date of Birth: 1931/08/19 Referring Provider (OT): Justice Britain, MD   Encounter Date: 01/13/2019  OT End of Session - 01/13/19 0928    Visit Number  19    Number of Visits  32    Date for OT Re-Evaluation  02/20/19   mini reassess on 9/21   Authorization Type  1) Medicare 80/20% 2) [employer] $10 copay with 30 visit limit. 0 used.    Authorization Time Period  see above    Authorization - Visit Number  17    Authorization - Number of Visits  30    OT Start Time  0902    OT Stop Time  0940    OT Time Calculation (min)  38 min    Activity Tolerance  Patient tolerated treatment well    Behavior During Therapy  WFL for tasks assessed/performed       Past Medical History:  Diagnosis Date  . Anemia 11/2015  . Arthritis   . Blood transfusion 2017 JULY 22  . Carotid stenosis    Dopplers [id number]: RICA 123456, LICA [id number]  . Cataract    REMOVED  . Chronic headaches    HX MIGRAINES  . Collagenous colitis   . Diverticulosis 2013   Colonoscopy  . Gastric ulcer 11/2015  . Glaucoma    BOTH EYES  . History of spinal stenosis   . History of uterine fibroid   . Hx of cardiac catheterization    LHC 3/14: no angiographic CAD, EF 65%  . Hx of echocardiogram    Echocardiogram 06/21/12: Mild LVH, EF 123456, grade 2 diastolic dysfunction, mild MR  . Hyperlipidemia   . Hypertension   . Hypothyroidism   . Internal hemorrhoids 2009   Colonoscopy  . UTI (urinary tract infection) HX OF    Past Surgical History:  Procedure Laterality Date  . BUNIONECTOMY  YRS AGO LEFT   RIGHT CONE AT [facility] 3 YRS AGO  . BUNIONECTOMY Left   . CATARACT EXTRACTION W/ INTRAOCULAR LENS IMPLANT  03/2010   Left  . CATARACT EXTRACTION W/ INTRAOCULAR LENS IMPLANT Bilateral 11/2010  . COLONOSCOPY   2013   NORMAL   . ESOPHAGOGASTRODUODENOSCOPY N/A 11/23/2015   Procedure: ESOPHAGOGASTRODUODENOSCOPY (EGD);  Surgeon: Rogene Houston, MD;  [facility];  Service: Endoscopy;  Laterality: N/A;  . EUS N/A 12/19/2015   Procedure: ESOPHAGEAL ENDOSCOPIC ULTRASOUND (EUS) RADIAL;  Surgeon: Milus Banister, MD;  [facility];  Service: Endoscopy;  Laterality: N/A;  . FUNCTIONAL ENDOSCOPIC SINUS SURGERY    . HAND SURGERY Right 1998  . INGUINAL HERNIA REPAIR Left 03/11/2016   Procedure: LEFT INGUINAL HERINA REPAIR WITH MESH;  Surgeon: Donnie Mesa, MD;  Location: [place];  Service: General;  Laterality: Left;  . INSERTION OF MESH Left 03/11/2016   Procedure: INSERTION OF MESH;  Surgeon: Donnie Mesa, MD;  Location: [place];  Service: General;  Laterality: Left;  . IR RADIOLOGIST EVAL & MGMT  07/01/2017  . LAMINECTOMY  03/2008  . LAPAROTOMY N/A 11/11/2017   Procedure: MINI LAPAROTOMY;  Surgeon: Everitt Amber, MD;  [facility];  Service: Gynecology;  Laterality: N/A;  . POLYPECTOMY    . REVERSE SHOULDER ARTHROPLASTY Right 10/13/2018   Procedure: REVERSE SHOULDER ARTHROPLASTY;  Surgeon: Justice Britain, MD;  [facility];  Service: Orthopedics;  Laterality: Right;  180min  . ROBOTIC ASSISTED TOTAL HYSTERECTOMY WITH BILATERAL SALPINGO OOPHERECTOMY Bilateral 11/11/2017   Procedure: XI ROBOTIC ASSISTED TOTAL HYSTERECTOMY WITH BILATERAL SALPINGO OOPHORECTOMY FOR UTERUS GREATER THAN 250 GRAMS;  Surgeon: Everitt Amber, MD;  [facility];  Service: Gynecology;  Laterality: Bilateral;  . TOTAL HIP ARTHROPLASTY  2008   Right    There were no vitals filed for this visit.  Subjective Assessment - 01/13/19 0915    Subjective   S: I'm able to put eye drops in my eyes now!    Currently in Pain?  No/denies         [facility] OT Assessment - 01/13/19 0915      Assessment   Medical Diagnosis  Right shoulder reverse total arthroplasty      Precautions   Precautions  Shoulder    Type of  Shoulder Precautions  Progress as tolerated               OT Treatments/Exercises (OP) - 01/13/19 0915      Exercises   Exercises  Shoulder      Shoulder Exercises: Supine   Protraction  PROM;5 reps    Horizontal ABduction  PROM;5 reps    External Rotation  PROM;5 reps    Internal Rotation  PROM;5 reps    Flexion  PROM;5 reps    ABduction  PROM;5 reps      Shoulder Exercises: Standing   External Rotation  Strengthening;10 reps    External Rotation Weight (lbs)  1    Internal Rotation  Strengthening;10 reps    Internal Rotation Weight (lbs)  1    Flexion  Strengthening;10 reps    Shoulder Flexion Weight (lbs)  1      Shoulder Exercises: Therapy Ball   Right/Left  5 reps      Shoulder Exercises: ROM/Strengthening   UBE (Upper Arm Bike)  Level 2 2' forward 2' reverse    pace: 5.0-6.0   Over Head Lace  2' seated    Proximal Shoulder Strengthening, Seated  10X with 1# no rest breaks    Other ROM/Strengthening Exercises  Passed ball 10X around waist then 10X around head focusing on internal and external rotation               OT Short Term Goals - 12/28/18 1139      OT SHORT TERM GOAL #1   Title  Patient will be educated and independent with HEP to faciliate progress in therapy and allow her to return to using her RUE as her dominant extremity.    Time  4    Period  Weeks    Status  On-going    Target Date  11/29/18      OT SHORT TERM GOAL #2   Title  Patient will increase RUE P/ROM to [facility] in order to increase ability to complete UB dressing tasks with less difficulty.    Time  4    Period  Weeks      OT SHORT TERM GOAL #3   Title  Patient will increase RUE strength to 3/5 in order to complete waist level activities with more strength and stability.    Time  4    Period  Weeks      OT SHORT TERM GOAL #4   Title  Patient will decrease fascial restrictions in her RUE to mod amount or less in order to increase functional mobility needed to complete  reaching tasks.  Time  4    Period  Weeks      OT SHORT TERM GOAL #5   Title  Patient will decrease pain level to approximately 5/10 during functional daily tasks.    Time  4    Period  Weeks        OT Long Term Goals - 12/28/18 1139      OT LONG TERM GOAL #1   Title  Patient will reach her highest level of independence while using her RUE as her dominant extremity for 75% or more tasks.    Time  8    Period  Weeks    Status  On-going      OT LONG TERM GOAL #2   Title  Patient will increase her A/ROM of her RUE to [facility] in order to complete overhead reaching tasks with increased comfort and ease.    Time  8    Period  Weeks    Status  On-going      OT LONG TERM GOAL #3   Title  Patient will increase RUE strength to 4/5 in order to complete normal household lifting tasks while using her RUE.    Time  8    Period  Weeks    Status  On-going      OT LONG TERM GOAL #4   Title  Patient will decrease her RUE fascial restrictions to min amount or less in order to increase her functional mobility needed when reaching into the overhead cabinets.    Time  8    Period  Weeks    Status  On-going      OT LONG TERM GOAL #5   Title  Pt will report a decrease in pain of approximately 2/10 or less when completing daily tasks with her RUE.    Time  8    Period  Weeks    Status  On-going            Plan - 01/13/19 [id number]    Clinical Impression Statement  A: Continued to focus on internal and external rotation during session to increase ability to comb her hair and reach behind her back. Added 1# handweight standing for strengthening. During passive stretching, was able to hear an audible pop followed by an increase in passive shoulder flexion and abduction. VC for form and technique were provided.    Body Structure / Function / Physical Skills  ADL;ROM;Scar mobility;IADL;Edema;Fascial restriction;Mobility;Strength;Coordination;Decreased knowledge of use of DME;Decreased knowledge of  precautions;UE functional use;Pain    Plan  P: Reassess and discharge with HEP.    Consulted and Agree with Plan of Care  Patient       Patient will benefit from skilled therapeutic intervention in order to improve the following deficits and impairments:   Body Structure / Function / Physical Skills: ADL, ROM, Scar mobility, IADL, Edema, Fascial restriction, Mobility, Strength, Coordination, Decreased knowledge of use of DME, Decreased knowledge of precautions, UE functional use, Pain       Visit Diagnosis: Acute pain of right shoulder  Stiffness of right shoulder, not elsewhere classified  Other symptoms and signs involving the musculoskeletal system    Problem List Patient Active Problem List   Diagnosis Date Noted  . S/P reverse total shoulder arthroplasty, right 10/13/2018  . Fibroid uterus 11/11/2017  . Fibroids 11/03/2017  . Acute blood loss anemia 11/22/2015  . Colitis 11/22/2015  . UGI bleed   . Melena   . History of Helicobacter pylori infection   .  Other fatigue   . Weakness   . Collagenous colitis 07/16/2011  . Chronic diarrhea 06/09/2011  . History of peptic ulcer disease 05/08/2011  . Personal history of colonic polyps 05/08/2011  . Family history of malignant neoplasm of gastrointestinal tract 05/08/2011  . Other general symptoms  05/08/2011  . Iron deficiency anemia, unspecified  05/08/2011  . Diarrhea 05/08/2011  . Diverticulosis of colon (without mention of hemorrhage) 05/08/2011   Ailene Ravel, OTR/L,CBIS  [phone]  01/13/2019, 9:42 AM  [facility] [address] [place], [place], [address] Phone: [phone]   Fax:  [phone]  Name: Carol Wells MRN: [id number] Date of Birth: 14-Jul-1931

## 2019-01-16 ENCOUNTER — Ambulatory Visit (HOSPITAL_COMMUNITY): Payer: Medicare Other

## 2019-01-16 ENCOUNTER — Other Ambulatory Visit: Payer: Self-pay

## 2019-01-16 ENCOUNTER — Encounter (HOSPITAL_COMMUNITY): Payer: Self-pay

## 2019-01-16 DIAGNOSIS — M25511 Pain in right shoulder: Secondary | ICD-10-CM | POA: Diagnosis not present

## 2019-01-16 DIAGNOSIS — R29898 Other symptoms and signs involving the musculoskeletal system: Secondary | ICD-10-CM

## 2019-01-16 DIAGNOSIS — M25611 Stiffness of right shoulder, not elsewhere classified: Secondary | ICD-10-CM | POA: Diagnosis not present

## 2019-01-16 NOTE — Therapy (Signed)
[facility] [address] [place], [place], [address] Phone: [phone]   Fax:  [phone]  Occupational Therapy Treatment Reassess and discharge Patient Details  Name: Carol Wells MRN: [id number] Date of Birth: 21-Aug-1931 Referring Provider (OT): Justice Britain, MD   Encounter Date: 01/16/2019  OT End of Session - 01/16/19 1015    Visit Number  20    Number of Visits  32    Authorization Type  1) Medicare 80/20% 2) [employer] $10 copay with 30 visit limit. 0 used.    Authorization Time Period  see above    Authorization - Visit Number  20    Authorization - Number of Visits  30    OT Start Time  [phone]    OT Stop Time  1027   reassess and discharge   OT Time Calculation (min)  41 min    Activity Tolerance  Patient tolerated treatment well    Behavior During Therapy  WFL for tasks assessed/performed       Past Medical History:  Diagnosis Date  . Anemia 11/2015  . Arthritis   . Blood transfusion 2017 JULY 22  . Carotid stenosis    Dopplers 7/56: RICA 43-32%, LICA 9-51%  . Cataract    REMOVED  . Chronic headaches    HX MIGRAINES  . Collagenous colitis   . Diverticulosis 2013   Colonoscopy  . Gastric ulcer 11/2015  . Glaucoma    BOTH EYES  . History of spinal stenosis   . History of uterine fibroid   . Hx of cardiac catheterization    LHC 3/14: no angiographic CAD, EF 65%  . Hx of echocardiogram    Echocardiogram 06/21/12: Mild LVH, EF 88-41%, grade 2 diastolic dysfunction, mild MR  . Hyperlipidemia   . Hypertension   . Hypothyroidism   . Internal hemorrhoids 2009   Colonoscopy  . UTI (urinary tract infection) HX OF    Past Surgical History:  Procedure Laterality Date  . BUNIONECTOMY  YRS AGO LEFT   RIGHT [facility] 3 YRS AGO  . BUNIONECTOMY Left   . CATARACT EXTRACTION W/ INTRAOCULAR LENS IMPLANT  03/2010   Left  . CATARACT EXTRACTION W/ INTRAOCULAR LENS IMPLANT Bilateral 11/2010  . COLONOSCOPY  2013   NORMAL    . ESOPHAGOGASTRODUODENOSCOPY N/A 11/23/2015   Procedure: ESOPHAGOGASTRODUODENOSCOPY (EGD);  Surgeon: Rogene Houston, MD;  [facility];  Service: Endoscopy;  Laterality: N/A;  . EUS N/A 12/19/2015   Procedure: ESOPHAGEAL ENDOSCOPIC ULTRASOUND (EUS) RADIAL;  Surgeon: Milus Banister, MD;  Location: [facility];  Service: Endoscopy;  Laterality: N/A;  . FUNCTIONAL ENDOSCOPIC SINUS SURGERY    . HAND SURGERY Right 1998  . INGUINAL HERNIA REPAIR Left 03/11/2016   Procedure: LEFT INGUINAL HERINA REPAIR WITH MESH;  Surgeon: Donnie Mesa, MD;  Location: [facility];  Service: General;  Laterality: Left;  . INSERTION OF MESH Left 03/11/2016   Procedure: INSERTION OF MESH;  Surgeon: Donnie Mesa, MD;  Location: [facility];  Service: General;  Laterality: Left;  . IR RADIOLOGIST EVAL & MGMT  07/01/2017  . LAMINECTOMY  03/2008  . LAPAROTOMY N/A 11/11/2017   Procedure: MINI LAPAROTOMY;  Surgeon: Everitt Amber, MD;  Location: [facility];  Service: Gynecology;  Laterality: N/A;  . POLYPECTOMY    . REVERSE SHOULDER ARTHROPLASTY Right 10/13/2018   Procedure: REVERSE SHOULDER ARTHROPLASTY;  Surgeon: Justice Britain, MD;  Location: [facility];  Service: Orthopedics;  Laterality: Right;  149mn  .  ROBOTIC ASSISTED TOTAL HYSTERECTOMY WITH BILATERAL SALPINGO OOPHERECTOMY Bilateral 11/11/2017   Procedure: XI ROBOTIC ASSISTED TOTAL HYSTERECTOMY WITH BILATERAL SALPINGO OOPHORECTOMY FOR UTERUS GREATER THAN 250 GRAMS;  Surgeon: Everitt Amber, MD;  Location: [facility];  Service: Gynecology;  Laterality: Bilateral;  . TOTAL HIP ARTHROPLASTY  2008   Right    There were no vitals filed for this visit.  Subjective Assessment - 01/16/19 1014    Subjective   S: I was able to get almost all the hair in the back combed this morning. I'm still trying.    Special Tests  FOTO: 62/100    Currently in Pain?  No/denies         [facility] OT Assessment - 01/16/19 0950      Assessment   Medical Diagnosis  Right shoulder reverse total arthroplasty       Precautions   Precautions  Shoulder    Type of Shoulder Precautions  Progress as tolerated      ROM / Strength   AROM / PROM / Strength  AROM;PROM;Strength      Palpation   Palpation comment  Zero fascial restrictions in right upper arm, trapezius, and scapularis region       AROM   Overall AROM Comments  assessed in seated, external and internal rotation with shoulder adducted    AROM Assessment Site  Shoulder    Right/Left Shoulder  Right    Right Shoulder Flexion  113 Degrees   previous: 110   Right Shoulder ABduction  101 Degrees   previous: 90   Right Shoulder Internal Rotation  90 Degrees   previous: same   Right Shoulder External Rotation  60 Degrees   previous: 50     PROM   Overall PROM Comments  Assessed supine. IR/er adducted    PROM Assessment Site  Shoulder    Right/Left Shoulder  Right    Right Shoulder Flexion  150 Degrees   previous: 130   Right Shoulder ABduction  155 Degrees   previous: 95   Right Shoulder Internal Rotation  90 Degrees   previous: same   Right Shoulder External Rotation  60 Degrees   previous: 50     Strength   Overall Strength Comments  assessed in seated, external and internal rotation with shoulder adducted    Strength Assessment Site  Shoulder    Right/Left Shoulder  Right    Right Shoulder Flexion  4/5   previuos: same   Right Shoulder ABduction  4+/5   previous: 4/5   Right Shoulder Internal Rotation  4+/5   previous: same   Right Shoulder External Rotation  4+/5   previous: same              OT Treatments/Exercises (OP) - 01/16/19 1016      Exercises   Exercises  Shoulder      Shoulder Exercises: Supine   Protraction  PROM;5 reps    Horizontal ABduction  PROM;5 reps    External Rotation  PROM;5 reps    Internal Rotation  PROM;5 reps    Flexion  PROM;5 reps    ABduction  PROM;5 reps      Shoulder Exercises: Standing   Other Standing Exercises  Ball pass around waist 10 times. Ball pass around neck  10 times. green weighted ball      Shoulder Exercises: Therapy Ball   Right/Left  5 reps             OT Education - 01/16/19 1015  Education Details  Reviewed goals and progress in therapy. Discussed HEP. Continue with shoulder stretches, scapular strengthening with theraband.    Person(s) Educated  Patient    Methods  Explanation    Comprehension  Verbalized understanding       OT Short Term Goals - 01/16/19 1000      OT SHORT TERM GOAL #1   Title  Patient will be educated and independent with HEP to faciliate progress in therapy and allow her to return to using her RUE as her dominant extremity.    Time  4    Period  Weeks    Status  Achieved    Target Date  11/29/18      OT SHORT TERM GOAL #2   Title  Patient will increase RUE P/ROM to [facility] in order to increase ability to complete UB dressing tasks with less difficulty.    Time  4    Period  Weeks      OT SHORT TERM GOAL #3   Title  Patient will increase RUE strength to 3/5 in order to complete waist level activities with more strength and stability.    Time  4    Period  Weeks      OT SHORT TERM GOAL #4   Title  Patient will decrease fascial restrictions in her RUE to mod amount or less in order to increase functional mobility needed to complete reaching tasks.    Time  4    Period  Weeks      OT SHORT TERM GOAL #5   Title  Patient will decrease pain level to approximately 5/10 during functional daily tasks.    Time  4    Period  Weeks        OT Long Term Goals - 01/16/19 1000      OT LONG TERM GOAL #1   Title  Patient will reach her highest level of independence while using her RUE as her dominant extremity for 75% or more tasks.    Time  8    Period  Weeks    Status  Achieved      OT LONG TERM GOAL #2   Title  Patient will increase her A/ROM of her RUE to [facility] in order to complete overhead reaching tasks with increased comfort and ease.    Baseline  Patient is able to complete shoulder level  reaching tasks although is unable to complete overhead reaching tasks.    Time  8    Period  Weeks    Status  Partially Met      OT LONG TERM GOAL #3   Title  Patient will increase RUE strength to 4/5 in order to complete normal household lifting tasks while using her RUE.    Time  8    Period  Weeks    Status  Achieved      OT LONG TERM GOAL #4   Title  Patient will decrease her RUE fascial restrictions to min amount or less in order to increase her functional mobility needed when reaching into the overhead cabinets.    Time  8    Period  Weeks    Status  Achieved      OT LONG TERM GOAL #5   Title  Pt will report a decrease in pain of approximately 2/10 or less when completing daily tasks with her RUE.    Time  8    Period  Weeks    Status  Achieved            Plan - 01/16/19 1018    Clinical Impression Statement  A: Reassessment and discharge completed this date. patient has met all therapy goals with one partially met related to A/ROM and being able to reach overhead. Patient is able to complete all reaching tasks comfortably at shoulder level although has max difficulty when reaching above shoulder. Patient is able to place food dishes into the microwave which is built into cabinets. She is using her hand held shoulder head to wash her hair. She reports that she must use her left arm to assist with placing a gallon of milk on the shelf. HEP was reviewed and patient is independent with exercises and ready for discharge.    Body Structure / Function / Physical Skills  ADL;ROM;Scar mobility;IADL;Edema;Fascial restriction;Mobility;Strength;Coordination;Decreased knowledge of use of DME;Decreased knowledge of precautions;UE functional use;Pain    Plan  P: Discharge from OT services with HEP.    Consulted and Agree with Plan of Care  Patient       Patient will benefit from skilled therapeutic intervention in order to improve the following deficits and impairments:   Body  Structure / Function / Physical Skills: ADL, ROM, Scar mobility, IADL, Edema, Fascial restriction, Mobility, Strength, Coordination, Decreased knowledge of use of DME, Decreased knowledge of precautions, UE functional use, Pain       Visit Diagnosis: Acute pain of right shoulder  Stiffness of right shoulder, not elsewhere classified  Other symptoms and signs involving the musculoskeletal system    Problem List Patient Active Problem List   Diagnosis Date Noted  . S/P reverse total shoulder arthroplasty, right 10/13/2018  . Fibroid uterus 11/11/2017  . Fibroids 11/03/2017  . Acute blood loss anemia 11/22/2015  . Colitis 11/22/2015  . UGI bleed   . Melena   . History of Helicobacter pylori infection   . Other fatigue   . Weakness   . Collagenous colitis 07/16/2011  . Chronic diarrhea 06/09/2011  . History of peptic ulcer disease 05/08/2011  . Personal history of colonic polyps 05/08/2011  . Family history of malignant neoplasm of gastrointestinal tract 05/08/2011  . Other general symptoms  05/08/2011  . Iron deficiency anemia, unspecified  05/08/2011  . Diarrhea 05/08/2011  . Diverticulosis of colon (without mention of hemorrhage) 05/08/2011    OCCUPATIONAL THERAPY DISCHARGE SUMMARY  Visits from Start of Care: 20  Current functional level related to goals / functional outcomes: See above   Remaining deficits: See above   Education / Equipment: See above Plan: Patient agrees to discharge.  Patient goals were met. Patient is being discharged due to meeting the stated rehab goals.  ?????         Ailene Ravel, OTR/L,CBIS  [phone]  01/16/2019, 10:52 AM  [facility] [address] [place], [place], [address] Phone: [phone]   Fax:  [phone]  Name: Carol Wells MRN: [id number] Date of Birth: 03-27-1932

## 2019-01-30 DIAGNOSIS — Z79899 Other long term (current) drug therapy: Secondary | ICD-10-CM | POA: Diagnosis not present

## 2019-01-30 DIAGNOSIS — Z5181 Encounter for therapeutic drug level monitoring: Secondary | ICD-10-CM | POA: Diagnosis not present

## 2019-02-01 ENCOUNTER — Other Ambulatory Visit: Payer: Self-pay | Admitting: Internal Medicine

## 2019-02-01 DIAGNOSIS — Z1231 Encounter for screening mammogram for malignant neoplasm of breast: Secondary | ICD-10-CM

## 2019-02-14 DIAGNOSIS — L900 Lichen sclerosus et atrophicus: Secondary | ICD-10-CM | POA: Diagnosis not present

## 2019-02-14 DIAGNOSIS — Z124 Encounter for screening for malignant neoplasm of cervix: Secondary | ICD-10-CM | POA: Diagnosis not present

## 2019-02-14 DIAGNOSIS — Z01419 Encounter for gynecological examination (general) (routine) without abnormal findings: Secondary | ICD-10-CM | POA: Diagnosis not present

## 2019-02-17 DIAGNOSIS — Z961 Presence of intraocular lens: Secondary | ICD-10-CM | POA: Diagnosis not present

## 2019-02-17 DIAGNOSIS — H401411 Capsular glaucoma with pseudoexfoliation of lens, right eye, mild stage: Secondary | ICD-10-CM | POA: Diagnosis not present

## 2019-03-21 ENCOUNTER — Other Ambulatory Visit: Payer: Self-pay

## 2019-03-21 ENCOUNTER — Ambulatory Visit
Admission: RE | Admit: 2019-03-21 | Discharge: 2019-03-21 | Disposition: A | Discharge status: Home / Self Care | Payer: Medicare Other | Source: Ambulatory Visit | Attending: Internal Medicine | Admitting: Internal Medicine

## 2019-03-21 DIAGNOSIS — Z1231 Encounter for screening mammogram for malignant neoplasm of breast: Secondary | ICD-10-CM | POA: Diagnosis not present

## 2019-03-22 ENCOUNTER — Other Ambulatory Visit: Payer: Self-pay | Admitting: Internal Medicine

## 2019-03-22 DIAGNOSIS — R928 Other abnormal and inconclusive findings on diagnostic imaging of breast: Secondary | ICD-10-CM

## 2019-03-24 ENCOUNTER — Ambulatory Visit: Payer: Medicare Other

## 2019-03-24 ENCOUNTER — Other Ambulatory Visit: Payer: Self-pay

## 2019-03-24 ENCOUNTER — Ambulatory Visit
Admission: RE | Admit: 2019-03-24 | Discharge: 2019-03-24 | Disposition: A | Discharge status: Home / Self Care | Payer: Medicare Other | Source: Ambulatory Visit | Attending: Internal Medicine | Admitting: Internal Medicine

## 2019-03-24 DIAGNOSIS — R928 Other abnormal and inconclusive findings on diagnostic imaging of breast: Secondary | ICD-10-CM

## 2019-03-24 DIAGNOSIS — R922 Inconclusive mammogram: Secondary | ICD-10-CM | POA: Diagnosis not present

## 2019-04-12 DIAGNOSIS — Z471 Aftercare following joint replacement surgery: Secondary | ICD-10-CM | POA: Diagnosis not present

## 2019-04-12 DIAGNOSIS — Z96611 Presence of right artificial shoulder joint: Secondary | ICD-10-CM | POA: Diagnosis not present

## 2019-05-15 ENCOUNTER — Ambulatory Visit: Payer: Medicare Other | Attending: Internal Medicine

## 2019-05-15 ENCOUNTER — Other Ambulatory Visit: Payer: Self-pay

## 2019-05-15 DIAGNOSIS — Z20822 Contact with and (suspected) exposure to covid-19: Secondary | ICD-10-CM

## 2019-05-22 DIAGNOSIS — T1512XA Foreign body in conjunctival sac, left eye, initial encounter: Secondary | ICD-10-CM | POA: Diagnosis not present

## 2019-05-22 DIAGNOSIS — S0502XA Injury of conjunctiva and corneal abrasion without foreign body, left eye, initial encounter: Secondary | ICD-10-CM | POA: Diagnosis not present

## 2019-05-26 DIAGNOSIS — S0502XD Injury of conjunctiva and corneal abrasion without foreign body, left eye, subsequent encounter: Secondary | ICD-10-CM | POA: Diagnosis not present

## 2019-05-28 ENCOUNTER — Ambulatory Visit: Payer: Medicare Other | Attending: Internal Medicine

## 2019-05-28 DIAGNOSIS — Z23 Encounter for immunization: Secondary | ICD-10-CM

## 2019-05-28 NOTE — Progress Notes (Signed)
   Covid-19 Vaccination Clinic  Name:  Carol Wells    MRN: [id number] DOB: 1932/01/03  05/28/2019  [name] was observed post Covid-19 immunization for 15 minutes without incidence. She was provided with Vaccine Information Sheet and instruction to access the V-Safe system.   [name] was instructed to call 911 with any severe reactions post vaccine: Marland Kitchen Difficulty breathing  . Swelling of your face and throat  . A fast heartbeat  . A bad rash all over your body  . Dizziness and weakness    Immunizations Administered    Name Date Dose VIS Date Route   Pfizer COVID-19 Vaccine 05/28/2019  1:24 PM 0.3 mL 04/14/2019 Intramuscular   Manufacturer: [place]   Lot: [id number]   [place]: [id number]

## 2019-05-29 DIAGNOSIS — Z5181 Encounter for therapeutic drug level monitoring: Secondary | ICD-10-CM | POA: Diagnosis not present

## 2019-05-29 DIAGNOSIS — M542 Cervicalgia: Secondary | ICD-10-CM | POA: Diagnosis not present

## 2019-05-29 DIAGNOSIS — Z79899 Other long term (current) drug therapy: Secondary | ICD-10-CM | POA: Diagnosis not present

## 2019-05-29 DIAGNOSIS — G894 Chronic pain syndrome: Secondary | ICD-10-CM | POA: Diagnosis not present

## 2019-05-29 DIAGNOSIS — M5412 Radiculopathy, cervical region: Secondary | ICD-10-CM | POA: Diagnosis not present

## 2019-06-19 ENCOUNTER — Ambulatory Visit: Payer: Medicare Other | Attending: Internal Medicine

## 2019-06-19 DIAGNOSIS — Z23 Encounter for immunization: Secondary | ICD-10-CM

## 2019-06-19 NOTE — Progress Notes (Signed)
   Covid-19 Vaccination Clinic  Name:  Carol Wells    MRN: [id number] DOB: 1931-10-15  06/19/2019  [name] was observed post Covid-19 immunization for 15 minutes without incidence. She was provided with Vaccine Information Sheet and instruction to access the V-Safe system.   [name] was instructed to call 911 with any severe reactions post vaccine: Marland Kitchen Difficulty breathing  . Swelling of your face and throat  . A fast heartbeat  . A bad Wells all over your body  . Dizziness and weakness    Immunizations Administered    Name Date Dose VIS Date Route   Pfizer COVID-19 Vaccine 06/19/2019  8:54 AM 0.3 mL 04/14/2019 Intramuscular   Manufacturer: [place]   Lot: [id number]   Person: [id number]

## 2019-06-30 NOTE — Progress Notes (Signed)
Date:  07/03/2019   ID:  DESTONY ORI, DOB Aug 11, 1931, MRN [id number]  PCP:  [name], MD     Pt presents for f/u of diastolic dysfunction     Patient is an 86 yo with a history of SOB  Echo with Gr II diastolic dsyfunction Mild MR   Stres echo suggested ischemia.  Went on to  Cardiac cath in 06/2012:  No CAD  LVEF 65%.  She also has a history of HTN with white coat effect.  She was last in clinic in 2018  Since seen she has had shoulder surgery and she also had a hysterectomy/oophorectomy for fibroid.  From a cardiac standpoint she has done well.  Breathing is okay.  She denies chest discomfort.  No lower extremity edema.  No PND.  She is very active.  Patient had the Covid vaccine already.  Wt Readings from Last 3 Encounters:  07/03/19 126 lb 3.2 oz (57.2 kg)  10/13/18 119 lb 4.8 oz (54.1 kg)  10/06/18 119 lb 4.8 oz (54.1 kg)     Past Medical History:  Diagnosis Date  . Anemia 11/2015  . Arthritis   . Blood transfusion 2017 JULY 22  . Carotid stenosis    Dopplers [id number]: RICA 123456, LICA [id number]  . Cataract    REMOVED  . Chronic headaches    HX MIGRAINES  . Collagenous colitis   . Diverticulosis 2013   Colonoscopy  . Gastric ulcer 11/2015  . Glaucoma    BOTH EYES  . History of spinal stenosis   . History of uterine fibroid   . Hx of cardiac catheterization    LHC 3/14: no angiographic CAD, EF 65%  . Hx of echocardiogram    Echocardiogram 06/21/12: Mild LVH, EF 123456, grade 2 diastolic dysfunction, mild MR  . Hyperlipidemia   . Hypertension   . Hypothyroidism   . Internal hemorrhoids 2009   Colonoscopy  . UTI (urinary tract infection) HX OF    Current Outpatient Medications  Medication Sig Dispense Refill  . atorvastatin (LIPITOR) 10 MG tablet Take 5 mg by mouth at bedtime.     . Calcium Carb-Cholecalciferol (CALCIUM 600+D) 600-800 MG-UNIT TABS Take 1 tablet by mouth daily with lunch.    . clobetasol ointment (TEMOVATE) [id number] % Apply 1  application topically as needed (lichen sclerosus).     Marland Kitchen estradiol (ESTRACE) 0.5 MG tablet Take 0.5 mg by mouth at bedtime.     . ferrous sulfate 325 (65 FE) MG tablet Take 325 mg by mouth every other day.    . hydrochlorothiazide (HYDRODIURIL) 12.5 MG tablet hydrochlorothiazide 12.5 mg tablet  TAKE 1 TABLET BY MOUTH EVERY DAY    . latanoprost (XALATAN) 0.005 % ophthalmic solution Place 1 drop into both eyes at bedtime.      Marland Kitchen levothyroxine (SYNTHROID, LEVOTHROID) 125 MCG tablet Take 125 mcg by mouth daily.    Marland Kitchen losartan (COZAAR) 100 MG tablet losartan 100 mg tablet  TAKE 1 TABLET BY MOUTH EVERY DAY    . Multiple Vitamin (MULTIVITAMIN) tablet Take 1 tablet by mouth daily with lunch.     . traMADol (ULTRAM) 50 MG tablet Take 0.5-1 tablets (25-50 mg total) by mouth every 6 (six) hours as needed for severe pain. 30 tablet 0  . vitamin C (ASCORBIC ACID) 500 MG tablet Take 500 mg by mouth daily with lunch.      No current facility-administered medications for this visit.    Allergies:   No  Known Allergies  Social History:  The patient  reports that she has never smoked. She has never used smokeless tobacco. She reports current alcohol use. She reports that she does not use drugs.   ROS:  Please see the history of present illness.   Her husband states she snores and he has witnessed apneic episodes.  She admits to napping often.   All other systems reviewed and negative.   PHYSICAL EXAM: VS:  BP (!) 148/82   Pulse 82   Ht 5\' 1"  (1.549 m)   Wt 126 lb 3.2 oz (57.2 kg)   BMI 23.85 kg/m  Well nourished, well developed, in no acute distress HEENT: normal Neck: no JVD Cardiac:  normal S1, S2; RRR; no murmur Lungs:  clear to auscultation bilaterally, no wheezing, rhonchi or rales Abd: soft, nontender, no hepatomegaly Ext: no edema; right groin without hematoma or bruit  Skin: warm and dry Neuro:  CNs 2-12 intact, no focal abnormalities noted  EKG  SR 82 bpm    ASSESSMENT AND PLAN:      1 history of diastolic dysfunction.  Volume status looks good.  Clinically she is doing well.     2  Hypertension: Patient says her blood pressure at home is running better 120s to 130/80.  Encouraged her to take her cuff periodically to make sure it still reading accurately.   3  Hyperlipidemia: Patient has atherosclerotic plaquing noted on a CT scan in the abdomen.  Cardiac catheterization in 2014 was negative for CAD.  She has been on low-dose Lipitor and has done well.  Last lipids done by Dr. Nevada Crane in June of last year, LDL was 82.  HDL 53.  Continue.  Follow-up in 1 years time.

## 2019-07-03 ENCOUNTER — Ambulatory Visit (INDEPENDENT_AMBULATORY_CARE_PROVIDER_SITE_OTHER): Payer: Medicare Other | Admitting: Internal Medicine

## 2019-07-03 ENCOUNTER — Encounter: Payer: Self-pay | Admitting: Internal Medicine

## 2019-07-03 ENCOUNTER — Other Ambulatory Visit: Payer: Self-pay

## 2019-07-03 VITALS — BP 148/82 | HR 82 | Ht 61.0 in | Wt 126.2 lb

## 2019-07-03 DIAGNOSIS — I10 Essential (primary) hypertension: Secondary | ICD-10-CM

## 2019-07-03 NOTE — Patient Instructions (Signed)
Medication Instructions:  No changes *If you need a refill on your cardiac medications before your next appointment, please call your pharmacy*   Lab Work: None  Testing/Procedures: none   Follow-Up: At [facility], you and your health needs are our priority.  As part of our continuing mission to provide you with exceptional heart care, we have created designated Provider Care Teams.  These Care Teams include your primary Cardiologist (physician) and Advanced Practice Providers (APPs -  Physician Assistants and Nurse Practitioners) who all work together to provide you with the care you need, when you need it.  Your next appointment:   12 month(s)  The format for your next appointment:   Either In Person or Virtual  Provider:   You may see Dorris Carnes, MD or one of the following Advanced Practice Providers on your designated Care Team:    Richardson Dopp, PA-C  Vin [place], [place]  Daune Perch, [place]

## 2019-07-24 ENCOUNTER — Encounter: Payer: Self-pay | Admitting: Family Medicine

## 2019-07-24 ENCOUNTER — Other Ambulatory Visit: Payer: Self-pay

## 2019-07-24 ENCOUNTER — Ambulatory Visit: Payer: Medicare Other | Admitting: Family Medicine

## 2019-07-24 DIAGNOSIS — M48061 Spinal stenosis, lumbar region without neurogenic claudication: Secondary | ICD-10-CM | POA: Diagnosis not present

## 2019-07-24 NOTE — Progress Notes (Signed)
Carol Wells [place] Phone: [phone] Subjective:   [name], am serving as a scribe for Dr. Hulan Saas. This visit occurred during the SARS-CoV-2 public health emergency.  Safety protocols were in place, including screening questions prior to the visit, additional usage of staff PPE, and extensive cleaning of exam room while observing appropriate contact time as indicated for disinfecting solutions.   I'm seeing this patient by the request  of:  [name], MD  CC: Pain in all over pain  [id number]  Carol Wells is a 86 y.o. female coming in with complaint of back pain. States that her lower and thoracic spine both her. She states that posture is the primary cause of her thoracic spine pain. Had lumbar laminectomy 2009. Pain lying supine and with gardening. Feels stiff. Pain is more on the left than the right. Denies any radiating symptoms. History of right hip replacement in 2008. Right shoulder replacement 2020. Stretches in the morning. Use 25mg  of Tramadol daily.  Patient is looking for anything that could help with some of the pain and wants to avoid any surgical intervention or any significant number of medications if possible    Past Medical History:  Diagnosis Date  . Anemia 11/2015  . Arthritis   . Blood transfusion 2017 JULY 22  . Carotid stenosis    Dopplers [id number]: RICA 123456, LICA [id number]  . Cataract    REMOVED  . Chronic headaches    HX MIGRAINES  . Collagenous colitis   . Diverticulosis 2013   Colonoscopy  . Gastric ulcer 11/2015  . Glaucoma    BOTH EYES  . History of spinal stenosis   . History of uterine fibroid   . Hx of cardiac catheterization    LHC 3/14: no angiographic CAD, EF 65%  . Hx of echocardiogram    Echocardiogram 06/21/12: Mild LVH, EF 123456, grade 2 diastolic dysfunction, mild MR  . Hyperlipidemia   . Hypertension   . Hypothyroidism   . Internal hemorrhoids 2009   Colonoscopy  . UTI (urinary tract infection) HX OF   Past Surgical History:  Procedure Laterality Date  . BUNIONECTOMY  YRS AGO LEFT   RIGHT [facility] AT [facility] 3 YRS AGO  . BUNIONECTOMY Left   . CATARACT EXTRACTION W/ INTRAOCULAR LENS IMPLANT  03/2010   Left  . CATARACT EXTRACTION W/ INTRAOCULAR LENS IMPLANT Bilateral 11/2010  . COLONOSCOPY  2013   NORMAL   . ESOPHAGOGASTRODUODENOSCOPY N/A 11/23/2015   Procedure: ESOPHAGOGASTRODUODENOSCOPY (EGD);  Surgeon: Rogene Houston, MD;  [facility];  Service: Endoscopy;  Laterality: N/A;  . EUS N/A 12/19/2015   Procedure: ESOPHAGEAL ENDOSCOPIC ULTRASOUND (EUS) RADIAL;  Surgeon: Milus Banister, MD;  Location: [facility];  Service: Endoscopy;  Laterality: N/A;  . FUNCTIONAL ENDOSCOPIC SINUS SURGERY    . HAND SURGERY Right 1998  . INGUINAL HERNIA REPAIR Left 03/11/2016   Procedure: LEFT INGUINAL HERINA REPAIR WITH MESH;  Surgeon: Donnie Mesa, MD;  Location: [place];  Service: General;  Laterality: Left;  . INSERTION OF MESH Left 03/11/2016   Procedure: INSERTION OF MESH;  Surgeon: Donnie Mesa, MD;  Location: [place];  Service: General;  Laterality: Left;  . IR RADIOLOGIST EVAL & MGMT  07/01/2017  . LAMINECTOMY  03/2008  . LAPAROTOMY N/A 11/11/2017   Procedure: MINI LAPAROTOMY;  Surgeon: Everitt Amber, MD;  [facility]: [facility];  Service: Gynecology;  Laterality: N/A;  . POLYPECTOMY    .  REVERSE SHOULDER ARTHROPLASTY Right 10/13/2018   Procedure: REVERSE SHOULDER ARTHROPLASTY;  Surgeon: Justice Britain, MD;  Location: [facility];  Service: Orthopedics;  Laterality: Right;  14min  . ROBOTIC ASSISTED TOTAL HYSTERECTOMY WITH BILATERAL SALPINGO OOPHERECTOMY Bilateral 11/11/2017   Procedure: XI ROBOTIC ASSISTED TOTAL HYSTERECTOMY WITH BILATERAL SALPINGO OOPHORECTOMY FOR UTERUS GREATER THAN 250 GRAMS;  Surgeon: Everitt Amber, MD;  Location: [facility];  Service: Gynecology;  Laterality: Bilateral;  . TOTAL HIP ARTHROPLASTY  2008   Right   Social History    Socioeconomic History  . Marital status: Married    Spouse name: Not on file  . Number of children: 2  . Years of education: Not on file  . Highest education level: Not on file  Occupational History  . Occupation: Retired Therapist, sports  Tobacco Use  . Smoking status: Never Smoker  . Smokeless tobacco: Never Used  Substance and Sexual Activity  . Alcohol use: Yes    Comment: 1 glass at dinner  . Drug use: Never  . Sexual activity: Not Currently    Birth control/protection: Surgical  Other Topics Concern  . Not on file  Social History Narrative  . Not on file   Social Determinants of Health   Financial Resource Strain:   . Difficulty of Paying Living Expenses:   Food Insecurity:   . Worried About Charity fundraiser in the Last Year:   . Arboriculturist in the Last Year:   Transportation Needs:   . Film/video editor (Medical):   Marland Kitchen Lack of Transportation (Non-Medical):   Physical Activity:   . Days of Exercise per Week:   . Minutes of Exercise per Session:   Stress:   . Feeling of Stress :   Social Connections:   . Frequency of Communication with Friends and Family:   . Frequency of Social Gatherings with Friends and Family:   . Attends Religious Services:   . Active Member of Clubs or Organizations:   . Attends Archivist Meetings:   Marland Kitchen Marital Status:    No Known Allergies Family History  Problem Relation Age of Onset  . Colon cancer Brother   . Colon polyps Brother     Current Outpatient Medications (Endocrine & Metabolic):  .  estradiol (ESTRACE) 0.5 MG tablet, Take 0.5 mg by mouth at bedtime.  Marland Kitchen  levothyroxine (SYNTHROID, LEVOTHROID) 125 MCG tablet, Take 125 mcg by mouth daily.  Current Outpatient Medications (Cardiovascular):  .  atorvastatin (LIPITOR) 10 MG tablet, Take 5 mg by mouth at bedtime.  .  hydrochlorothiazide (HYDRODIURIL) 12.5 MG tablet, hydrochlorothiazide 12.5 mg tablet  TAKE 1 TABLET BY MOUTH EVERY DAY .  losartan (COZAAR) 100 MG  tablet, losartan 100 mg tablet  TAKE 1 TABLET BY MOUTH EVERY DAY   Current Outpatient Medications (Analgesics):  .  traMADol (ULTRAM) 50 MG tablet, Take 0.5-1 tablets (25-50 mg total) by mouth every 6 (six) hours as needed for severe pain.  Current Outpatient Medications (Hematological):  .  ferrous sulfate 325 (65 FE) MG tablet, Take 325 mg by mouth every other day.  Current Outpatient Medications (Other):  Marland Kitchen  Calcium Carb-Cholecalciferol (CALCIUM 600+D) 600-800 MG-UNIT TABS, Take 1 tablet by mouth daily with lunch. .  clobetasol ointment (TEMOVATE) AB-123456789 %, Apply 1 application topically as needed (lichen sclerosus).  Marland Kitchen  latanoprost (XALATAN) 0.005 % ophthalmic solution, Place 1 drop into both eyes at bedtime.   .  Multiple Vitamin (MULTIVITAMIN) tablet, Take 1 tablet by mouth daily with  lunch.  .  vitamin C (ASCORBIC ACID) 500 MG tablet, Take 500 mg by mouth daily with lunch.    Reviewed prior external information including notes and imaging from  primary care provider As well as notes that were available from care everywhere and other healthcare systems.  Past medical history, social, surgical and family history all reviewed in electronic medical record.  No pertanent information unless stated regarding to the chief complaint.   Review of Systems:  No headache, visual changes, nausea, vomiting, diarrhea, constipation, dizziness, abdominal pain, skin rash, fevers, chills, night sweats, weight loss, swollen lymph nodes, , joint swelling, chest pain, shortness of breath, mood changes. POSITIVE muscle aches, body aches  Objective  Blood pressure 120/82, pulse 71, height 5\' 1"  (1.549 m), weight 125 lb (56.7 kg), SpO2 98 %.    General: No apparent distress alert and oriented x3 mood and affect normal, dressed appropriately.  HEENT: Pupils equal, extraocular movements intact  Respiratory: Patient's speak in full sentences and does not appear short of breath  Cardiovascular: No lower  extremity edema, non tender, no erythema  Neuro: Cranial nerves II through XII are intact, neurovascularly intact in all extremities with 2+ DTRs and 2+ pulses.  Gait mild antalgic MSK:  tender with mild limited range of motion and good stability and symmetric strength arthritic changes of multiple joints noted. Arthritic changes of the neck noted.  Lacks last 10 degrees of extension.  Crepitus noted.  4+ out of 5 strength of the hands bilaterally.  Deep tendon reflexes appear to be intact Mild increase in kyphosis of the upper thoracic spine mild scoliosis that seems to be degenerative in the thoracolumbar junction intact distally but does have some mild atrophy of the legs bilaterally of her thighs    Impression and Recommendations:     This case required medical decision making of moderate complexity. The above documentation has been reviewed and is accurate and complete Lyndal Pulley, DO       Note: This dictation was prepared with Dragon dictation along with smaller phrase technology. Any transcriptional errors that result from this process are unintentional.

## 2019-07-24 NOTE — Patient Instructions (Signed)
Good to see you.  Ice 20 minutes 2 times daily. Usually after activity and before bed. Exercises 3 times a week.  Tart cherry extract 1200mg  at night Vitamin D 4000 IU daily  See me again in 4-6 weeks

## 2019-07-25 ENCOUNTER — Encounter: Payer: Self-pay | Admitting: Family Medicine

## 2019-07-25 DIAGNOSIS — M48061 Spinal stenosis, lumbar region without neurogenic claudication: Secondary | ICD-10-CM | POA: Insufficient documentation

## 2019-07-25 NOTE — Assessment & Plan Note (Signed)
Lumbar spinal stenosis.  Status post surgical intervention.  Including worsening osteoarthritic changes of multiple joints.  Patient continues to be relatively active.  Home exercises given for range of motion.  Discussed diet changes syncope beneficial.  Over-the-counter medications.  Follow-up again in 4 to 8 weeks

## 2019-08-11 DIAGNOSIS — H401131 Primary open-angle glaucoma, bilateral, mild stage: Secondary | ICD-10-CM | POA: Diagnosis not present

## 2019-08-29 ENCOUNTER — Ambulatory Visit (INDEPENDENT_AMBULATORY_CARE_PROVIDER_SITE_OTHER): Payer: Medicare Other | Admitting: Family Medicine

## 2019-08-29 ENCOUNTER — Other Ambulatory Visit: Payer: Self-pay

## 2019-08-29 ENCOUNTER — Ambulatory Visit (INDEPENDENT_AMBULATORY_CARE_PROVIDER_SITE_OTHER): Payer: Medicare Other

## 2019-08-29 ENCOUNTER — Encounter: Payer: Self-pay | Admitting: Family Medicine

## 2019-08-29 ENCOUNTER — Other Ambulatory Visit (INDEPENDENT_AMBULATORY_CARE_PROVIDER_SITE_OTHER): Payer: Medicare Other

## 2019-08-29 VITALS — BP 140/80 | HR 74 | Ht 61.0 in | Wt 127.0 lb

## 2019-08-29 DIAGNOSIS — M2550 Pain in unspecified joint: Secondary | ICD-10-CM

## 2019-08-29 DIAGNOSIS — M48061 Spinal stenosis, lumbar region without neurogenic claudication: Secondary | ICD-10-CM

## 2019-08-29 DIAGNOSIS — E038 Other specified hypothyroidism: Secondary | ICD-10-CM | POA: Diagnosis not present

## 2019-08-29 DIAGNOSIS — M549 Dorsalgia, unspecified: Secondary | ICD-10-CM | POA: Diagnosis not present

## 2019-08-29 DIAGNOSIS — G8929 Other chronic pain: Secondary | ICD-10-CM

## 2019-08-29 DIAGNOSIS — E559 Vitamin D deficiency, unspecified: Secondary | ICD-10-CM | POA: Diagnosis not present

## 2019-08-29 DIAGNOSIS — M545 Low back pain: Secondary | ICD-10-CM | POA: Diagnosis not present

## 2019-08-29 LAB — COMPREHENSIVE METABOLIC PANEL
ALT: 19 U/L (ref 0–35)
AST: 27 U/L (ref 0–37)
Albumin: 4.1 g/dL (ref 3.5–5.2)
Alkaline Phosphatase: 48 U/L (ref 39–117)
BUN: 17 mg/dL (ref 6–23)
CO2: 32 mEq/L (ref 19–32)
Calcium: 9.1 mg/dL (ref 8.4–10.5)
Chloride: 96 mEq/L (ref 96–112)
Creatinine, Ser: 0.64 mg/dL (ref 0.40–1.20)
GFR: 87.98 mL/min (ref >60.00)
Glucose, Bld: 81 mg/dL (ref 70–99)
Potassium: 4.2 mEq/L (ref 3.5–5.1)
Sodium: 131 mEq/L — ABNORMAL LOW (ref 135–145)
Total Bilirubin: 0.7 mg/dL (ref 0.2–1.2)
Total Protein: 7.5 g/dL (ref 6.0–8.3)

## 2019-08-29 LAB — CBC WITH DIFFERENTIAL/PLATELET
Basophils Absolute: 0 10*3/uL (ref 0.0–0.1)
Basophils Relative: 0.3 % (ref 0.0–3.0)
Eosinophils Absolute: 0.2 10*3/uL (ref 0.0–0.7)
Eosinophils Relative: 3.9 % (ref 0.0–5.0)
HCT: 36.9 % (ref 36.0–46.0)
Hemoglobin: 12.4 g/dL (ref 12.0–15.0)
Lymphocytes Relative: 47.9 % — ABNORMAL HIGH (ref 12.0–46.0)
Lymphs Abs: 2.6 10*3/uL (ref 0.7–4.0)
MCHC: 33.5 g/dL (ref 30.0–36.0)
MCV: 91.6 fl (ref 78.0–100.0)
Monocytes Absolute: 0.4 10*3/uL (ref 0.1–1.0)
Monocytes Relative: 8 % (ref 3.0–12.0)
Neutro Abs: 2.2 10*3/uL (ref 1.4–7.7)
Neutrophils Relative %: 39.9 % — ABNORMAL LOW (ref 43.0–77.0)
Platelets: 257 10*3/uL (ref 150.0–400.0)
RBC: 4.03 Mil/uL (ref 3.87–5.11)
RDW: 13.6 % (ref 11.5–15.5)
WBC: 5.5 10*3/uL (ref 4.0–10.5)

## 2019-08-29 LAB — IBC PANEL
Iron: 103 ug/dL (ref 42–145)
Saturation Ratios: 32.3 % (ref 20.0–50.0)
Transferrin: 228 mg/dL (ref 212.0–360.0)

## 2019-08-29 LAB — C-REACTIVE PROTEIN: CRP: <1 mg/dL (ref 0.5–20.0)

## 2019-08-29 LAB — VITAMIN D 25 HYDROXY (VIT D DEFICIENCY, FRACTURES): VITD: 85.41 ng/mL (ref 30.00–100.00)

## 2019-08-29 LAB — FERRITIN: Ferritin: 70.4 ng/mL (ref 10.0–291.0)

## 2019-08-29 LAB — TSH: TSH: 3.09 u[IU]/mL (ref 0.35–4.50)

## 2019-08-29 LAB — URIC ACID: Uric Acid, Serum: 3.7 mg/dL (ref 2.4–7.0)

## 2019-08-29 LAB — SEDIMENTATION RATE: Sed Rate: 15 mm/hr (ref 0–30)

## 2019-08-29 NOTE — Progress Notes (Signed)
Carol Wells [address] [place] Phone: [phone] Subjective:   I Carol Wells am serving as a Education administrator for Dr. Hulan Saas.  This visit occurred during the SARS-CoV-2 public health emergency.  Safety protocols were in place, including screening questions prior to the visit, additional usage of staff PPE, and extensive cleaning of exam room while observing appropriate contact time as indicated for disinfecting solutions.   I'm seeing this patient by the request  of:  [name], MD  CC: Back pain and allover pain follow-up  [id number]   07/24/2019 Lumbar spinal stenosis.  Status post surgical intervention.  Including worsening osteoarthritic changes of multiple joints.  Patient continues to be relatively active.  Home exercises given for range of motion.  Discussed diet changes syncope beneficial.  Over-the-counter medications.  Follow-up again in 4 to 8 weeks  08/29/2019 Carol Wells is a 86 y.o. female coming in with complaint of back pain. Patient states she has not noticed a change.  Patient continues to have pain on a regular basis.  Has had more recent stomach issues but does not think it is the vitamins.  Continues to have pain more at night but is more consistent with her spinal stenosis.  Has been able to continue to work in the yard relatively well.  Patient though did try to go hiking and was unable to do it regularly secondary to the pain in her low back and legs.     Past Medical History:  Diagnosis Date  . Anemia 11/2015  . Arthritis   . Blood transfusion 2017 JULY 22  . Carotid stenosis    Dopplers Q000111Q: RICA 123456, LICA [id number]  . Cataract    REMOVED  . Chronic headaches    HX MIGRAINES  . Collagenous colitis   . Diverticulosis 2013   Colonoscopy  . Gastric ulcer 11/2015  . Glaucoma    BOTH EYES  . History of spinal stenosis   . History of uterine fibroid   . Hx of cardiac catheterization    LHC 3/14:  no angiographic CAD, EF 65%  . Hx of echocardiogram    Echocardiogram 06/21/12: Mild LVH, EF 123456, grade 2 diastolic dysfunction, mild MR  . Hyperlipidemia   . Hypertension   . Hypothyroidism   . Internal hemorrhoids 2009   Colonoscopy  . UTI (urinary tract infection) HX OF   Past Surgical History:  Procedure Laterality Date  . BUNIONECTOMY  YRS AGO LEFT   RIGHT CONE AT [facility] 3 YRS AGO  . BUNIONECTOMY Left   . CATARACT EXTRACTION W/ INTRAOCULAR LENS IMPLANT  03/2010   Left  . CATARACT EXTRACTION W/ INTRAOCULAR LENS IMPLANT Bilateral 11/2010  . COLONOSCOPY  2013   NORMAL   . ESOPHAGOGASTRODUODENOSCOPY N/A 11/23/2015   Procedure: ESOPHAGOGASTRODUODENOSCOPY (EGD);  Surgeon: Rogene Houston, MD;  [facility];  Service: Endoscopy;  Laterality: N/A;  . EUS N/A 12/19/2015   Procedure: ESOPHAGEAL ENDOSCOPIC ULTRASOUND (EUS) RADIAL;  Surgeon: Milus Banister, MD;  [facility];  Service: Endoscopy;  Laterality: N/A;  . FUNCTIONAL ENDOSCOPIC SINUS SURGERY    . HAND SURGERY Right 1998  . INGUINAL HERNIA REPAIR Left 03/11/2016   Procedure: LEFT INGUINAL HERINA REPAIR WITH MESH;  Surgeon: Donnie Mesa, MD;  Location: [facility];  Service: General;  Laterality: Left;  . INSERTION OF MESH Left 03/11/2016   Procedure: INSERTION OF MESH;  Surgeon: Donnie Mesa, MD;  Location: [facility];  Service:  General;  Laterality: Left;  . IR RADIOLOGIST EVAL & MGMT  07/01/2017  . LAMINECTOMY  03/2008  . LAPAROTOMY N/A 11/11/2017   Procedure: MINI LAPAROTOMY;  Surgeon: Everitt Amber, MD;  [facility];  Service: Gynecology;  Laterality: N/A;  . POLYPECTOMY    . REVERSE SHOULDER ARTHROPLASTY Right 10/13/2018   Procedure: REVERSE SHOULDER ARTHROPLASTY;  Surgeon: Justice Britain, MD;  [facility];  Service: Orthopedics;  Laterality: Right;  136min  . ROBOTIC ASSISTED TOTAL HYSTERECTOMY WITH BILATERAL SALPINGO OOPHERECTOMY Bilateral 11/11/2017   Procedure: XI ROBOTIC ASSISTED TOTAL HYSTERECTOMY WITH  BILATERAL SALPINGO OOPHORECTOMY FOR UTERUS GREATER THAN 250 GRAMS;  Surgeon: Everitt Amber, MD;  [facility];  Service: Gynecology;  Laterality: Bilateral;  . TOTAL HIP ARTHROPLASTY  2008   Right   Social History   Socioeconomic History  . Marital status: Married    Spouse name: Not on file  . Number of children: 2  . Years of education: Not on file  . Highest education level: Not on file  Occupational History  . Occupation: Retired Therapist, sports  Tobacco Use  . Smoking status: Never Smoker  . Smokeless tobacco: Never Used  Substance and Sexual Activity  . Alcohol use: Yes    Comment: 1 glass at dinner  . Drug use: Never  . Sexual activity: Not Currently    Birth control/protection: Surgical  Other Topics Concern  . Not on file  Social History Narrative  . Not on file   Social Determinants of Health   Financial Resource Strain:   . Difficulty of Paying Living Expenses:   Food Insecurity:   . Worried About Charity fundraiser in the Last Year:   . Arboriculturist in the Last Year:   Transportation Needs:   . Film/video editor (Medical):   Marland Kitchen Lack of Transportation (Non-Medical):   Physical Activity:   . Days of Exercise per Week:   . Minutes of Exercise per Session:   Stress:   . Feeling of Stress :   Social Connections:   . Frequency of Communication with Friends and Family:   . Frequency of Social Gatherings with Friends and Family:   . Attends Religious Services:   . Active Member of Clubs or Organizations:   . Attends Archivist Meetings:   Marland Kitchen Marital Status:    No Known Allergies Family History  Problem Relation Age of Onset  . Colon cancer Brother   . Colon polyps Brother     Current Outpatient Medications (Endocrine & Metabolic):  .  estradiol (ESTRACE) 0.5 MG tablet, Take 0.5 mg by mouth at bedtime.  Marland Kitchen  levothyroxine (SYNTHROID, LEVOTHROID) 125 MCG tablet, Take 125 mcg by mouth daily.  Current Outpatient Medications (Cardiovascular):  .   atorvastatin (LIPITOR) 10 MG tablet, Take 5 mg by mouth at bedtime.  .  hydrochlorothiazide (HYDRODIURIL) 12.5 MG tablet, hydrochlorothiazide 12.5 mg tablet  TAKE 1 TABLET BY MOUTH EVERY DAY .  losartan (COZAAR) 100 MG tablet, losartan 100 mg tablet  TAKE 1 TABLET BY MOUTH EVERY DAY   Current Outpatient Medications (Analgesics):  .  traMADol (ULTRAM) 50 MG tablet, Take 0.5-1 tablets (25-50 mg total) by mouth every 6 (six) hours as needed for severe pain.  Current Outpatient Medications (Hematological):  .  ferrous sulfate 325 (65 FE) MG tablet, Take 325 mg by mouth every other day.  Current Outpatient Medications (Other):  Marland Kitchen  Calcium Carb-Cholecalciferol (CALCIUM 600+D) 600-800 MG-UNIT TABS, Take 1 tablet by mouth  daily with lunch. .  clobetasol ointment (TEMOVATE) AB-123456789 %, Apply 1 application topically as needed (lichen sclerosus).  Marland Kitchen  latanoprost (XALATAN) 0.005 % ophthalmic solution, Place 1 drop into both eyes at bedtime.   .  Multiple Vitamin (MULTIVITAMIN) tablet, Take 1 tablet by mouth daily with lunch.  .  vitamin C (ASCORBIC ACID) 500 MG tablet, Take 500 mg by mouth daily with lunch.    Reviewed prior external information including notes and imaging from  primary care provider As well as notes that were available from care everywhere and other healthcare systems.  Past medical history, social, surgical and family history all reviewed in electronic medical record.  No pertanent information unless stated regarding to the chief complaint.   Review of Systems:  No headache, visual changes, nausea, vomiting, diarrhea, constipation, dizziness, abdominal pain, skin rash, fevers, chills, night sweats, weight loss, swollen lymph nodes,  joint swelling, chest pain, shortness of breath, mood changes. POSITIVE muscle aches, body aches Objective  Blood pressure 140/80, pulse 74, height 5\' 1"  (1.549 m), weight 127 lb (57.6 kg), SpO2 96 %.    General: No apparent distress alert and oriented  x3 mood and affect normal, dressed appropriately.  HEENT: Pupils equal, extraocular movements intact  Respiratory: Patient's speak in full sentences and does not appear short of breath  Cardiovascular: No lower extremity edema, non tender, no erythema  Neuro: Cranial nerves II through XII are intact, neurovascularly intact in all extremities with 2+ DTRs and 2+ pulses.  Gait antalgic Severe arthritic changes multiple joints.  Patient does have significance loss of lordosis of the lumbar spine with tightness with straight leg test.  Patient is tender to palpation in the paraspinal musculature lumbar spine.  Tightness with FABER test also noted.  Neurovascular intact distally.    Impression and Recommendations:     This case required medical decision making of moderate complexity. The above documentation has been reviewed and is accurate and complete Lyndal Pulley, DO       Note: This dictation was prepared with Dragon dictation along with smaller phrase technology. Any transcriptional errors that result from this process are unintentional.

## 2019-08-29 NOTE — Patient Instructions (Addendum)
Xray today Lab today Take 100 mg of gabapentin for the next 7-10 nights and write me about how you feel in a week See me again in 2 months

## 2019-08-29 NOTE — Assessment & Plan Note (Signed)
Chronic problem, mild exacerbation, will order labs to rule out any other type of causes that could contribute to some of the aches and pains.  Encourage patient to take gabapentin.  Prescribed today.  Discussed icing regimen.  Patient will continue with the conservative therapy and follow-up with me again in 8 weeks.  Patient has declined formal physical therapy and would like to avoid that at all costs possible in the future.

## 2019-08-29 NOTE — Addendum Note (Signed)
Addended by: Boris Lown B on: 08/29/2019 10:01 AM   Modules accepted: Orders

## 2019-09-01 LAB — ANA: Anti Nuclear Antibody (ANA): NEGATIVE

## 2019-09-01 LAB — PTH, INTACT AND CALCIUM
Calcium: 9.3 mg/dL (ref 8.6–10.4)
PTH: 12 pg/mL — ABNORMAL LOW (ref 14–64)

## 2019-09-01 LAB — RHEUMATOID FACTOR: Rhuematoid fact SerPl-aCnc: <14 IU/mL (ref <14)

## 2019-09-01 LAB — ANGIOTENSIN CONVERTING ENZYME: Angiotensin-Converting Enzyme: 46 U/L (ref 9–67)

## 2019-09-01 LAB — EXTRA SPECIMEN

## 2019-09-01 LAB — CYCLIC CITRUL PEPTIDE ANTIBODY, IGG: Cyclic Citrullin Peptide Ab: 89 UNITS — ABNORMAL HIGH

## 2019-09-01 LAB — CALCIUM, IONIZED: Calcium, Ion: 4.9 mg/dL (ref 4.8–5.6)

## 2019-09-08 ENCOUNTER — Other Ambulatory Visit: Payer: Self-pay | Admitting: Chiropractic Medicine

## 2019-09-08 ENCOUNTER — Other Ambulatory Visit (HOSPITAL_COMMUNITY): Payer: Self-pay | Admitting: Chiropractic Medicine

## 2019-09-08 DIAGNOSIS — M25562 Pain in left knee: Secondary | ICD-10-CM

## 2019-09-21 DIAGNOSIS — M25562 Pain in left knee: Secondary | ICD-10-CM | POA: Diagnosis not present

## 2019-09-29 ENCOUNTER — Encounter (HOSPITAL_COMMUNITY): Payer: Self-pay

## 2019-09-29 ENCOUNTER — Ambulatory Visit (HOSPITAL_COMMUNITY): Payer: Medicare Other

## 2019-10-03 DIAGNOSIS — S83272A Complex tear of lateral meniscus, current injury, left knee, initial encounter: Secondary | ICD-10-CM | POA: Diagnosis not present

## 2019-10-03 DIAGNOSIS — S83242A Other tear of medial meniscus, current injury, left knee, initial encounter: Secondary | ICD-10-CM | POA: Diagnosis not present

## 2019-10-11 DIAGNOSIS — Z96611 Presence of right artificial shoulder joint: Secondary | ICD-10-CM | POA: Diagnosis not present

## 2019-10-11 DIAGNOSIS — Z471 Aftercare following joint replacement surgery: Secondary | ICD-10-CM | POA: Diagnosis not present

## 2019-10-31 ENCOUNTER — Ambulatory Visit: Payer: Medicare Other | Admitting: Family Medicine

## 2020-01-05 DIAGNOSIS — G894 Chronic pain syndrome: Secondary | ICD-10-CM | POA: Diagnosis not present

## 2020-02-12 DIAGNOSIS — Z961 Presence of intraocular lens: Secondary | ICD-10-CM | POA: Diagnosis not present

## 2020-02-12 DIAGNOSIS — H401131 Primary open-angle glaucoma, bilateral, mild stage: Secondary | ICD-10-CM | POA: Diagnosis not present

## 2020-02-14 DIAGNOSIS — Z23 Encounter for immunization: Secondary | ICD-10-CM | POA: Diagnosis not present

## 2020-02-22 ENCOUNTER — Non-Acute Institutional Stay: Payer: Medicare Other | Admitting: Nurse Practitioner

## 2020-02-22 ENCOUNTER — Other Ambulatory Visit: Payer: Self-pay

## 2020-02-22 ENCOUNTER — Encounter: Payer: Self-pay | Admitting: Nurse Practitioner

## 2020-02-22 DIAGNOSIS — I10 Essential (primary) hypertension: Secondary | ICD-10-CM

## 2020-02-22 DIAGNOSIS — M545 Low back pain, unspecified: Secondary | ICD-10-CM | POA: Insufficient documentation

## 2020-02-22 DIAGNOSIS — E785 Hyperlipidemia, unspecified: Secondary | ICD-10-CM

## 2020-02-22 DIAGNOSIS — N952 Postmenopausal atrophic vaginitis: Secondary | ICD-10-CM

## 2020-02-22 DIAGNOSIS — E039 Hypothyroidism, unspecified: Secondary | ICD-10-CM | POA: Diagnosis not present

## 2020-02-22 DIAGNOSIS — M5442 Lumbago with sciatica, left side: Secondary | ICD-10-CM | POA: Diagnosis not present

## 2020-02-22 DIAGNOSIS — G8929 Other chronic pain: Secondary | ICD-10-CM

## 2020-02-22 DIAGNOSIS — E559 Vitamin D deficiency, unspecified: Secondary | ICD-10-CM

## 2020-02-22 DIAGNOSIS — E871 Hypo-osmolality and hyponatremia: Secondary | ICD-10-CM

## 2020-02-22 DIAGNOSIS — M1990 Unspecified osteoarthritis, unspecified site: Secondary | ICD-10-CM | POA: Diagnosis not present

## 2020-02-22 DIAGNOSIS — K922 Gastrointestinal hemorrhage, unspecified: Secondary | ICD-10-CM | POA: Diagnosis not present

## 2020-02-22 NOTE — Assessment & Plan Note (Signed)
Atrophic vaginitis, takes Estradiol

## 2020-02-22 NOTE — Assessment & Plan Note (Signed)
Hypothyroidism, takes Levothyroxine, TSH 3.09 08/29/19, update TSH

## 2020-02-22 NOTE — Assessment & Plan Note (Signed)
HTN, takes Losartan, HCTZ, Bun/creat 17/0.64 4/21, update CMP/eGFR

## 2020-02-22 NOTE — Assessment & Plan Note (Signed)
Hyponatremia, Na 131 08/29/19, repeat serum Na level.

## 2020-02-22 NOTE — Progress Notes (Signed)
Location:   clinic [place]   Place of Service:  Clinic (12) Provider: Marlana Latus NP  Code Status: DNR Goals of Care: [place] Advanced Directives 11/01/2018  Does Patient Have a Medical Advance Directive? No  Would patient like information on creating a medical advance directive? No - Patient declined  Pre-existing out of facility DNR order (yellow form or pink MOST form) -     Chief Complaint  Patient presents with   Medical Management of Chronic Issues    Patient here today to establish care.    HPI: Patient is a 86 y.o. female seen today for medical management of chronic diseases.    HTN, takes Losartan, HCTZ, Bun/creat 17/0.64 08/2019  OA, takes prn Tramadol  Hypothyroidism, takes Levothyroxine, TSH 3.09 08/29/19  Atrophic vaginitis, takes Estradiol   Vit D deficiency, takes Vit D, Vit D level 85 08/29/19  Hyperlipidemia, takes Atorvastatin  Hyponatremia, Na 131 08/29/19  Past Medical History:  Diagnosis Date   Anemia 11/2015   Arthritis    Blood transfusion 2017 JULY 22   Carotid stenosis    Dopplers 8/65: RICA 78-46%, LICA 9-62%   Cataract    REMOVED   Chronic headaches    HX MIGRAINES   Collagenous colitis    Diverticulosis 2013   Colonoscopy   Gastric ulcer 11/2015   Glaucoma    BOTH EYES   History of spinal stenosis    History of uterine fibroid    Hx of cardiac catheterization    LHC 3/14: no angiographic CAD, EF 65%   Hx of echocardiogram    Echocardiogram 06/21/12: Mild LVH, EF 95-28%, grade 2 diastolic dysfunction, mild MR   Hyperlipidemia    Hypertension    Hypothyroidism    Internal hemorrhoids 2009   Colonoscopy   UTI (urinary tract infection) HX OF    Past Surgical History:  Procedure Laterality Date   BUNIONECTOMY  YRS AGO LEFT   RIGHT [facility] AT [facility] 3 YRS AGO   BUNIONECTOMY Left    CATARACT EXTRACTION W/ INTRAOCULAR LENS IMPLANT  03/2010   Left   CATARACT EXTRACTION W/ INTRAOCULAR LENS IMPLANT Bilateral 11/2010    COLONOSCOPY  2013   NORMAL    ESOPHAGOGASTRODUODENOSCOPY N/A 11/23/2015   Procedure: ESOPHAGOGASTRODUODENOSCOPY (EGD);  Surgeon: Rogene Houston, MD;  [facility];  Service: Endoscopy;  Laterality: N/A;   EUS N/A 12/19/2015   Procedure: ESOPHAGEAL ENDOSCOPIC ULTRASOUND (EUS) RADIAL;  Surgeon: Milus Banister, MD;  [facility]: [facility];  Service: Endoscopy;  Laterality: N/A;   FUNCTIONAL ENDOSCOPIC SINUS SURGERY     HAMMER TOE SURGERY     HAND SURGERY Right 1998   INGUINAL HERNIA REPAIR Left 03/11/2016   Procedure: LEFT INGUINAL HERINA REPAIR WITH MESH;  Surgeon: Donnie Mesa, MD;  Location: [place];  Service: General;  Laterality: Left;   INSERTION OF MESH Left 03/11/2016   Procedure: INSERTION OF MESH;  Surgeon: Donnie Mesa, MD;  Location: [place];  Service: General;  Laterality: Left;   IR RADIOLOGIST EVAL & MGMT  07/01/2017   LAMINECTOMY  03/2008   LAPAROTOMY N/A 11/11/2017   Procedure: MINI LAPAROTOMY;  Surgeon: Everitt Amber, MD;  [facility];  Service: Gynecology;  Laterality: N/A;   POLYPECTOMY     REVERSE SHOULDER ARTHROPLASTY Right 10/13/2018   Procedure: REVERSE SHOULDER ARTHROPLASTY;  Surgeon: Justice Britain, MD;  [facility];  Service: Orthopedics;  Laterality: Right;  128mn   ROBOTIC ASSISTED TOTAL HYSTERECTOMY WITH BILATERAL SALPINGO OOPHERECTOMY Bilateral 11/11/2017  Procedure: XI ROBOTIC ASSISTED TOTAL HYSTERECTOMY WITH BILATERAL SALPINGO OOPHORECTOMY FOR UTERUS GREATER THAN 250 GRAMS;  Surgeon: Everitt Amber, MD;  [facility];  Service: Gynecology;  Laterality: Bilateral;   TONSILLECTOMY     TOTAL HIP ARTHROPLASTY  2008   Right    No Known Allergies  Allergies as of 02/22/2020   No Known Allergies     Medication List       Accurate as of February 22, 2020 11:59 PM. If you have any questions, ask your nurse or doctor.        atorvastatin 10 MG tablet Commonly known as: LIPITOR Take 5 mg by mouth at bedtime.   clobetasol  ointment 0.05 % Commonly known as: TEMOVATE Apply 1 application topically once a week.   conjugated estrogens vaginal cream Commonly known as: PREMARIN Place 1 Applicatorful vaginally daily.   estradiol 0.5 MG tablet Commonly known as: ESTRACE Take 0.5 mg by mouth at bedtime.   hydrochlorothiazide 12.5 MG tablet Commonly known as: HYDRODIURIL hydrochlorothiazide 12.5 mg tablet  TAKE 1 TABLET BY MOUTH EVERY DAY   latanoprost 0.005 % ophthalmic solution Commonly known as: XALATAN Place 1 drop into both eyes at bedtime.   levothyroxine 125 MCG tablet Commonly known as: SYNTHROID Take 125 mcg by mouth daily.   losartan 100 MG tablet Commonly known as: COZAAR losartan 100 mg tablet  TAKE 1 TABLET BY MOUTH EVERY DAY   multivitamin tablet Take 1 tablet by mouth daily with lunch.   QC Tumeric Complex 500 MG Caps Generic drug: Turmeric Take 1,500 mg by mouth 2 (two) times daily.   traMADol 50 MG tablet Commonly known as: ULTRAM Take 50 mg by mouth every 6 (six) hours as needed.   vitamin C 500 MG tablet Commonly known as: ASCORBIC ACID Take 500 mg by mouth daily with lunch.   Vitamin D3 Super Strength 50 MCG (2000 UT) Tabs Generic drug: Cholecalciferol Take 2 tablets by mouth daily. To Equal 4000       Review of Systems:  Review of Systems  Constitutional: Negative for activity change, appetite change and fever.  HENT: Positive for hearing loss. Negative for congestion and voice change.   Eyes: Negative for visual disturbance.  Respiratory: Negative for cough, shortness of breath and wheezing.   Cardiovascular: Negative for chest pain, palpitations and leg swelling.  Gastrointestinal: Negative for abdominal distention, abdominal pain, constipation, nausea and vomiting.       Left inguinal hernia repair2017  Genitourinary: Negative for difficulty urinating, dysuria, frequency, urgency, vaginal bleeding, vaginal discharge and vaginal pain.  Musculoskeletal: Positive  for arthralgias and back pain.       S/p R shoulder replacement 2020. Lower back, right side sciatica, prn Tramadol, pain most at night and in am  Skin: Negative for color change.  Neurological: Negative for speech difficulty, light-headedness and headaches.  Psychiatric/Behavioral: Negative for agitation, behavioral problems, hallucinations and sleep disturbance. The patient is not nervous/anxious.     Health Maintenance  Topic Date Due   TETANUS/TDAP  Never done   INFLUENZA VACCINE  Completed   DEXA SCAN  Completed   COVID-19 Vaccine  Completed   PNA vac Low Risk Adult  Completed    Physical Exam: Vitals:   02/22/20 1539  BP: 140/80  Pulse: 72  Temp: 97.8 F (36.6 C)  SpO2: 92%  Weight: 122 lb 9.6 oz (55.6 kg)  Height: 5' 1"  (1.549 m)   Body mass index is 23.17 kg/m. Physical Exam Vitals reviewed.  Constitutional:      Appearance: Normal appearance.  HENT:     Head: Normocephalic and atraumatic.     Nose: Nose normal.     Mouth/Throat:     Mouth: Mucous membranes are moist.  Eyes:     Extraocular Movements: Extraocular movements intact.     Conjunctiva/sclera: Conjunctivae normal.     Pupils: Pupils are equal, round, and reactive to light.     Comments: S/p R+L cataract extraction  Cardiovascular:     Rate and Rhythm: Normal rate and regular rhythm.     Heart sounds: No murmur heard.   Pulmonary:     Effort: Pulmonary effort is normal.     Breath sounds: No wheezing, rhonchi or rales.  Abdominal:     General: Bowel sounds are normal. There is no distension.     Palpations: Abdomen is soft.     Tenderness: There is no abdominal tenderness. There is no right CVA tenderness, left CVA tenderness or guarding.     Hernia: No hernia is present.     Comments: S/p left inguinal hernia repair  Musculoskeletal:     Cervical back: Normal range of motion and neck supple.     Right lower leg: No edema.     Left lower leg: No edema.     Comments: S/p R shoulder  replacement.   Skin:    General: Skin is warm and dry.     Comments: Surgical scar left inguinal hernia repair.   Neurological:     General: No focal deficit present.     Mental Status: She is alert and oriented to person, place, and time. Mental status is at baseline.     Motor: No weakness.     Coordination: Coordination normal.  Psychiatric:        Mood and Affect: Mood normal.        Behavior: Behavior normal.        Thought Content: Thought content normal.        Judgment: Judgment normal.     Labs reviewed: Basic Metabolic Panel: Recent Labs    08/29/19 1002  NA 131*  K 4.2  CL 96  CO2 32  GLUCOSE 81  BUN 17  CREATININE 0.64  CALCIUM 9.3   9.1  TSH 3.09   Liver Function Tests: Recent Labs    08/29/19 1002  AST 27  ALT 19  ALKPHOS 48  BILITOT 0.7  PROT 7.5  ALBUMIN 4.1   No results for input(s): LIPASE, AMYLASE in the last 8760 hours. No results for input(s): AMMONIA in the last 8760 hours. CBC: Recent Labs    08/29/19 1620  WBC 5.5  NEUTROABS 2.2  HGB 12.4  HCT 36.9  MCV 91.6  PLT 257.0   Lipid Panel: No results for input(s): CHOL, HDL, LDLCALC, TRIG, CHOLHDL, LDLDIRECT in the last 8760 hours. No results found for: HGBA1C  Procedures since last visit: No results found.  Assessment/Plan  HTN (hypertension) HTN, takes Losartan, HCTZ, Bun/creat 17/0.64 4/21, update CMP/eGFR   Osteoarthritis OA, takes prn Tramadol   Hypothyroidism Hypothyroidism, takes Levothyroxine, TSH 3.09 08/29/19, update TSH  Atrophic vaginitis Atrophic vaginitis, takes Estradiol  Vitamin D deficiency Vit D deficiency, takes Vit D, Vit D level 85 08/29/19   Hyperlipemia Hyperlipidemia, takes Atorvastatin, update lipid panel   Hyponatremia Hyponatremia, Na 131 08/29/19, repeat serum Na level.   UGI bleed Bleeding ulcer, blood transfusion 2017  Lower back pain S/p spinal stenosis surgery, Prn Tramadol is effective,  seeing Ortho.    Labs/tests ordered:   CBC/diff, CMP/eGFR, TSH, lipid panel  Next appt:  3 months

## 2020-02-22 NOTE — Assessment & Plan Note (Signed)
Hyperlipidemia, takes Atorvastatin, update lipid panel

## 2020-02-22 NOTE — Assessment & Plan Note (Addendum)
S/p spinal stenosis surgery, Prn Tramadol is effective, seeing Ortho.

## 2020-02-22 NOTE — Assessment & Plan Note (Signed)
Bleeding ulcer, blood transfusion 2017

## 2020-02-22 NOTE — Assessment & Plan Note (Signed)
OA, takes prn Tramadol

## 2020-02-22 NOTE — Assessment & Plan Note (Signed)
Vit D deficiency, takes Vit D, Vit D level 85 08/29/19

## 2020-02-23 ENCOUNTER — Encounter: Payer: Self-pay | Admitting: Nurse Practitioner

## 2020-02-27 ENCOUNTER — Other Ambulatory Visit: Payer: Self-pay

## 2020-02-27 DIAGNOSIS — E785 Hyperlipidemia, unspecified: Secondary | ICD-10-CM

## 2020-02-27 DIAGNOSIS — E039 Hypothyroidism, unspecified: Secondary | ICD-10-CM

## 2020-02-27 DIAGNOSIS — E871 Hypo-osmolality and hyponatremia: Secondary | ICD-10-CM

## 2020-02-28 LAB — LIPID PANEL
Cholesterol: 150 mg/dL (ref <200)
HDL: 60 mg/dL (ref >=50)
LDL Cholesterol (Calc): 75 mg/dL (calc)
Non-HDL Cholesterol (Calc): 90 mg/dL (calc) (ref <130)
Total CHOL/HDL Ratio: 2.5 (calc) (ref <5.0)
Triglycerides: 69 mg/dL (ref <150)

## 2020-02-28 LAB — COMPLETE METABOLIC PANEL WITH GFR
AG Ratio: 1.3 (calc) (ref 1.0–2.5)
ALT: 16 U/L (ref 6–29)
AST: 23 U/L (ref 10–35)
Albumin: 3.9 g/dL (ref 3.6–5.1)
Alkaline phosphatase (APISO): 50 U/L (ref 37–153)
BUN: 15 mg/dL (ref 7–25)
CO2: 31 mmol/L (ref 20–32)
Calcium: 9.4 mg/dL (ref 8.6–10.4)
Chloride: 101 mmol/L (ref 98–110)
Creat: 0.61 mg/dL (ref 0.60–0.88)
GFR, Est African American: 95 mL/min/{1.73_m2} (ref >=60)
GFR, Est Non African American: 82 mL/min/{1.73_m2} (ref >=60)
Globulin: 2.9 g/dL (calc) (ref 1.9–3.7)
Glucose, Bld: 93 mg/dL (ref 65–99)
Potassium: 4.3 mmol/L (ref 3.5–5.3)
Sodium: 138 mmol/L (ref 135–146)
Total Bilirubin: 0.7 mg/dL (ref 0.2–1.2)
Total Protein: 6.8 g/dL (ref 6.1–8.1)

## 2020-02-28 LAB — CBC WITH DIFFERENTIAL/PLATELET
Absolute Monocytes: 370 cells/uL (ref 200–950)
Basophils Absolute: 22 cells/uL (ref 0–200)
Basophils Relative: 0.5 %
Eosinophils Absolute: 211 cells/uL (ref 15–500)
Eosinophils Relative: 4.8 %
HCT: 38.2 % (ref 35.0–45.0)
Hemoglobin: 12.6 g/dL (ref 11.7–15.5)
Lymphs Abs: 1940 cells/uL (ref 850–3900)
MCH: 30.7 pg (ref 27.0–33.0)
MCHC: 33 g/dL (ref 32.0–36.0)
MCV: 92.9 fL (ref 80.0–100.0)
MPV: 10.1 fL (ref 7.5–12.5)
Monocytes Relative: 8.4 %
Neutro Abs: 1857 cells/uL (ref 1500–7800)
Neutrophils Relative %: 42.2 %
Platelets: 253 10*3/uL (ref 140–400)
RBC: 4.11 10*6/uL (ref 3.80–5.10)
RDW: 11.8 % (ref 11.0–15.0)
Total Lymphocyte: 44.1 %
WBC: 4.4 10*3/uL (ref 3.8–10.8)

## 2020-02-28 LAB — TSH: TSH: 2.43 mIU/L (ref 0.40–4.50)

## 2020-04-05 ENCOUNTER — Other Ambulatory Visit: Payer: Self-pay

## 2020-04-05 MED ORDER — ATORVASTATIN CALCIUM 10 MG PO TABS
5.0000 mg | ORAL_TABLET | Freq: Every day | ORAL | 3 refills | Status: DC
Start: 2020-04-05 — End: 2021-04-02

## 2020-04-05 MED ORDER — ATORVASTATIN CALCIUM 10 MG PO TABS
5.0000 mg | ORAL_TABLET | Freq: Every day | ORAL | 2 refills | Status: DC
Start: 2020-04-05 — End: 2020-04-05

## 2020-04-05 NOTE — Telephone Encounter (Signed)
Incoming call received from patient stating her husband seen Dr.Gupta today and she accompanied him to appointment. Carol Wells asked for a refill on her lipitor at the time, to be sent to mail order pharmacy and it was sent to a local [facility] instead.   Carol Wells asked that I send to [facility] and delete [facility] in [place] off her account. Correction complete.  I called [facility] in [place] and asked that they void rx sent earlier in error.

## 2020-04-05 NOTE — Addendum Note (Signed)
Addended by: Logan Bores on: 04/05/2020 01:51 PM   Modules accepted: Orders

## 2020-04-11 DIAGNOSIS — Z23 Encounter for immunization: Secondary | ICD-10-CM | POA: Diagnosis not present

## 2020-05-10 ENCOUNTER — Other Ambulatory Visit: Payer: Self-pay | Admitting: Internal Medicine

## 2020-05-10 ENCOUNTER — Other Ambulatory Visit: Payer: Self-pay

## 2020-05-10 ENCOUNTER — Encounter: Payer: Self-pay | Admitting: Internal Medicine

## 2020-05-10 ENCOUNTER — Non-Acute Institutional Stay: Payer: Medicare Other | Admitting: Internal Medicine

## 2020-05-10 VITALS — BP 142/96 | HR 59 | Temp 96.8°F | Ht 61.0 in | Wt 120.8 lb

## 2020-05-10 DIAGNOSIS — N952 Postmenopausal atrophic vaginitis: Secondary | ICD-10-CM

## 2020-05-10 DIAGNOSIS — E785 Hyperlipidemia, unspecified: Secondary | ICD-10-CM

## 2020-05-10 DIAGNOSIS — G894 Chronic pain syndrome: Secondary | ICD-10-CM | POA: Diagnosis not present

## 2020-05-10 DIAGNOSIS — Z1231 Encounter for screening mammogram for malignant neoplasm of breast: Secondary | ICD-10-CM

## 2020-05-10 DIAGNOSIS — Z79899 Other long term (current) drug therapy: Secondary | ICD-10-CM | POA: Diagnosis not present

## 2020-05-10 DIAGNOSIS — M8580 Other specified disorders of bone density and structure, unspecified site: Secondary | ICD-10-CM

## 2020-05-10 DIAGNOSIS — I10 Essential (primary) hypertension: Secondary | ICD-10-CM | POA: Diagnosis not present

## 2020-05-10 DIAGNOSIS — E039 Hypothyroidism, unspecified: Secondary | ICD-10-CM

## 2020-05-10 DIAGNOSIS — M5416 Radiculopathy, lumbar region: Secondary | ICD-10-CM | POA: Diagnosis not present

## 2020-05-10 DIAGNOSIS — M1990 Unspecified osteoarthritis, unspecified site: Secondary | ICD-10-CM

## 2020-05-10 MED ORDER — TETANUS-DIPHTH-ACELL PERTUSSIS 5-2.5-18.5 LF-MCG/0.5 IM SUSP: 0.5000 mL | Freq: Once | INTRAMUSCULAR | 0 refills | Status: AC

## 2020-05-12 NOTE — Progress Notes (Signed)
Location:  [place] of Service:  Clinic (12)  Provider:   Code Status:  Goals of Care:  Advanced Directives 11/01/2018  Does Patient Have a Medical Advance Directive? No  Would patient like information on creating a medical advance directive? No - Patient declined  Pre-existing out of facility DNR order (yellow form or pink MOST form) -     Chief Complaint  Patient presents with  . Medical Management of Chronic Issues    Patient here today to establish care.     HPI: Patient is a 87 y.o. female seen today for medical management of chronic diseases.    Patient has a history of hypertension, arthritis, hypothyroidism, atrophic vaginitis, hyperlipidemia and hyponatremia Patient does have chronic pain and follows with Dr. Alcide Evener for her opioid prescriptions  Otherwise is very active.  Walks almost 2 miles every day no falls does not use cane or a walker.  Lives with her husband who is 45 years old. No acute complaints today Past Medical History:  Diagnosis Date  . Anemia 11/2015  . Arthritis   . Blood transfusion 2017 JULY 22  . Carotid stenosis    Dopplers 4/74: RICA 25-95%, LICA 6-38%  . Cataract    REMOVED  . Chronic headaches    HX MIGRAINES  . Collagenous colitis   . Diverticulosis 2013   Colonoscopy  . Gastric ulcer 11/2015  . Glaucoma    BOTH EYES  . History of spinal stenosis   . History of uterine fibroid   . Hx of cardiac catheterization    LHC 3/14: no angiographic CAD, EF 65%  . Hx of echocardiogram    Echocardiogram 06/21/12: Mild LVH, EF 75-64%, grade 2 diastolic dysfunction, mild MR  . Hyperlipidemia   . Hypertension   . Hypothyroidism   . Internal hemorrhoids 2009   Colonoscopy  . UTI (urinary tract infection) HX OF    Past Surgical History:  Procedure Laterality Date  . BUNIONECTOMY  YRS AGO LEFT   RIGHT [facility] 3 YRS AGO  . BUNIONECTOMY Left   . CATARACT EXTRACTION W/ INTRAOCULAR LENS IMPLANT  03/2010   Left  .  CATARACT EXTRACTION W/ INTRAOCULAR LENS IMPLANT Bilateral 11/2010  . COLONOSCOPY  2013   NORMAL   . ESOPHAGOGASTRODUODENOSCOPY N/A 11/23/2015   Procedure: ESOPHAGOGASTRODUODENOSCOPY (EGD);  Surgeon: Rogene Houston, MD;  [facility];  Service: Endoscopy;  Laterality: N/A;  . EUS N/A 12/19/2015   Procedure: ESOPHAGEAL ENDOSCOPIC ULTRASOUND (EUS) RADIAL;  Surgeon: Milus Banister, MD;  [facility];  Service: Endoscopy;  Laterality: N/A;  . FUNCTIONAL ENDOSCOPIC SINUS SURGERY    . HAMMER TOE SURGERY    . HAND SURGERY Right 1998  . INGUINAL HERNIA REPAIR Left 03/11/2016   Procedure: LEFT INGUINAL HERINA REPAIR WITH MESH;  Surgeon: Donnie Mesa, MD;  Location: [place];  Service: General;  Laterality: Left;  . INSERTION OF MESH Left 03/11/2016   Procedure: INSERTION OF MESH;  Surgeon: Donnie Mesa, MD;  Location: [place];  Service: General;  Laterality: Left;  . IR RADIOLOGIST EVAL & MGMT  07/01/2017  . LAMINECTOMY  03/2008  . LAPAROTOMY N/A 11/11/2017   Procedure: MINI LAPAROTOMY;  Surgeon: Everitt Amber, MD;  [facility];  Service: Gynecology;  Laterality: N/A;  . POLYPECTOMY    . REVERSE SHOULDER ARTHROPLASTY Right 10/13/2018   Procedure: REVERSE SHOULDER ARTHROPLASTY;  Surgeon: Justice Britain, MD;  [facility];  Service: Orthopedics;  Laterality:  Right;  191min  . ROBOTIC ASSISTED TOTAL HYSTERECTOMY WITH BILATERAL SALPINGO OOPHERECTOMY Bilateral 11/11/2017   Procedure: XI ROBOTIC ASSISTED TOTAL HYSTERECTOMY WITH BILATERAL SALPINGO OOPHORECTOMY FOR UTERUS GREATER THAN 250 GRAMS;  Surgeon: Everitt Amber, MD;  [facility];  Service: Gynecology;  Laterality: Bilateral;  . TONSILLECTOMY    . TOTAL HIP ARTHROPLASTY  2008   Right    No Known Allergies  Outpatient Encounter Medications as of 05/10/2020  Medication Sig  . [DISCONTINUED] Tdap (BOOSTRIX) 5-2.5-18.5 LF-MCG/0.5 injection Inject 0.5 mLs into the muscle once.  Marland Kitchen atorvastatin (LIPITOR) 10 MG tablet Take 0.5  tablets (5 mg total) by mouth at bedtime.  . Cholecalciferol (VITAMIN D3 SUPER STRENGTH) 50 MCG (2000 UT) TABS Take 2 tablets by mouth daily. To Equal 4000  . clobetasol ointment (TEMOVATE) [id number] % Apply 1 application topically once a week.  . conjugated estrogens (PREMARIN) vaginal cream Place 1 Applicatorful vaginally daily.  Marland Kitchen estradiol (ESTRACE) 0.5 MG tablet Take 0.5 mg by mouth at bedtime.   . hydrochlorothiazide (HYDRODIURIL) 12.5 MG tablet hydrochlorothiazide 12.5 mg tablet  TAKE 1 TABLET BY MOUTH EVERY DAY  . latanoprost (XALATAN) 0.005 % ophthalmic solution Place 1 drop into both eyes at bedtime.    Marland Kitchen levothyroxine (SYNTHROID, LEVOTHROID) 125 MCG tablet Take 125 mcg by mouth daily.  Marland Kitchen losartan (COZAAR) 100 MG tablet losartan 100 mg tablet  TAKE 1 TABLET BY MOUTH EVERY DAY  . Multiple Vitamin (MULTIVITAMIN) tablet Take 1 tablet by mouth daily with lunch.   . [EXPIRED] Tdap (BOOSTRIX) 5-2.5-18.5 LF-MCG/0.5 injection Inject 0.5 mLs into the muscle once for 1 dose.  . traMADol (ULTRAM) 50 MG tablet Take 50 mg by mouth every 6 (six) hours as needed.  . Turmeric (QC TUMERIC COMPLEX) 500 MG CAPS Take 1,500 mg by mouth 2 (two) times daily.  . vitamin C (ASCORBIC ACID) 500 MG tablet Take 500 mg by mouth daily with lunch.    No facility-administered encounter medications on file as of 05/10/2020.    Review of Systems:  Review of Systems  Review of Systems  Constitutional: Negative for activity change, appetite change, chills, diaphoresis, fatigue and fever.  HENT: Negative for mouth sores, postnasal drip, rhinorrhea, sinus pain and sore throat.   Respiratory: Negative for apnea, cough, chest tightness, shortness of breath and wheezing.   Cardiovascular: Negative for chest pain, palpitations and leg swelling.  Gastrointestinal: Negative for abdominal distention, abdominal pain, constipation, diarrhea, nausea and vomiting.  Genitourinary: Negative for dysuria and frequency.  Musculoskeletal:  Negative for arthralgias, joint swelling and myalgias.  Skin: Negative for rash.  Neurological: Negative for dizziness, syncope, weakness, light-headedness and numbness.  Psychiatric/Behavioral: Negative for behavioral problems, confusion and sleep disturbance.     Health Maintenance  Topic Date Due  . TETANUS/TDAP  Never done  . COVID-19 Vaccine (3 - Booster for Pfizer series) 12/17/2019  . INFLUENZA VACCINE  Completed  . DEXA SCAN  Completed  . PNA vac Low Risk Adult  Completed    Physical Exam: Vitals:   05/10/20 1000  BP: (!) 142/96  Pulse: (!) 59  Temp: (!) 96.8 F (36 C)  SpO2: 98%  Weight: 120 lb 12.8 oz (54.8 kg)  Height: 5\' 1"  (1.549 m)   Body mass index is 22.82 kg/m. Physical Exam  Constitutional: Oriented to person, place, and time. Well-developed and well-nourished.  HENT:  Head: Normocephalic.  Mouth/Throat: Oropharynx is clear and moist.  TM bilateral normal Eyes: Pupils are equal, round, and reactive to light.  Neck: Neck supple.  Cardiovascular: Normal rate and normal heart sounds.  No murmur heard. Pulmonary/Chest: Effort normal and breath sounds normal. No respiratory distress. No wheezes. She has no rales.  Abdominal: Soft. Bowel sounds are normal. No distension. There is no tenderness. There is no rebound.  Musculoskeletal: No edema.  Lymphadenopathy: none Neurological: Alert and oriented to person, place, and time.  Skin: Skin is warm and dry.  Psychiatric: Normal mood and affect. Behavior is normal. Thought content normal.    Labs reviewed: Basic Metabolic Panel: Recent Labs    08/29/19 1002 02/27/20 0740  NA 131* 138  K 4.2 4.3  CL 96 101  CO2 32 31  GLUCOSE 81 93  BUN 17 15  CREATININE 0.64 0.61  CALCIUM 9.3  9.1 9.4  TSH 3.09 2.43   Liver Function Tests: Recent Labs    08/29/19 1002 02/27/20 0740  AST 27 23  ALT 19 16  ALKPHOS 48  --   BILITOT 0.7 0.7  PROT 7.5 6.8  ALBUMIN 4.1  --    No results for input(s):  LIPASE, AMYLASE in the last 8760 hours. No results for input(s): AMMONIA in the last 8760 hours. CBC: Recent Labs    08/29/19 1620 02/27/20 0740  WBC 5.5 4.4  NEUTROABS 2.2 1,857  HGB 12.4 12.6  HCT 36.9 38.2  MCV 91.6 92.9  PLT 257.0 253   Lipid Panel: Recent Labs    02/27/20 0740  CHOL 150  HDL 60  LDLCALC 75  TRIG 69  CHOLHDL 2.5   No results found for: HGBA1C  Procedures since last visit: No results found.  Assessment/Plan 1. Primary hypertension Continue Cozaar and HCTZ - CBC with Differential/Platelet; Future - COMPLETE METABOLIC PANEL WITH GFR; Future  2. Osteoarthritis, unspecified osteoarthritis type, unspecified site Follows with Dr Nelva Bush  3. Hypothyroidism, unspecified type  - TSH; Future  4. Atrophic vaginitis Follows with GYN On PO estrogen  5. Hyperlipidemia, unspecified hyperlipidemia type  - Lipid panel; Future  6. Osteopenia, unspecified location  - HM DEXA SCAN Was good on Estrogen PO    Labs/tests ordered:  * No order type specified * Next appt:  Visit date not found

## 2020-05-15 DIAGNOSIS — Z5181 Encounter for therapeutic drug level monitoring: Secondary | ICD-10-CM | POA: Diagnosis not present

## 2020-05-15 DIAGNOSIS — Z79899 Other long term (current) drug therapy: Secondary | ICD-10-CM | POA: Diagnosis not present

## 2020-06-03 IMAGING — DX DG LUMBAR SPINE COMPLETE 4+V
5 series · 5 of 5 positions shown · non-contrast
Comparison: None.

CLINICAL DATA: Chronic low back pain with history of previous
laminectomy. No injury.

EXAM:
LUMBAR SPINE - COMPLETE 4+ VIEW

[lumbar spine ap]
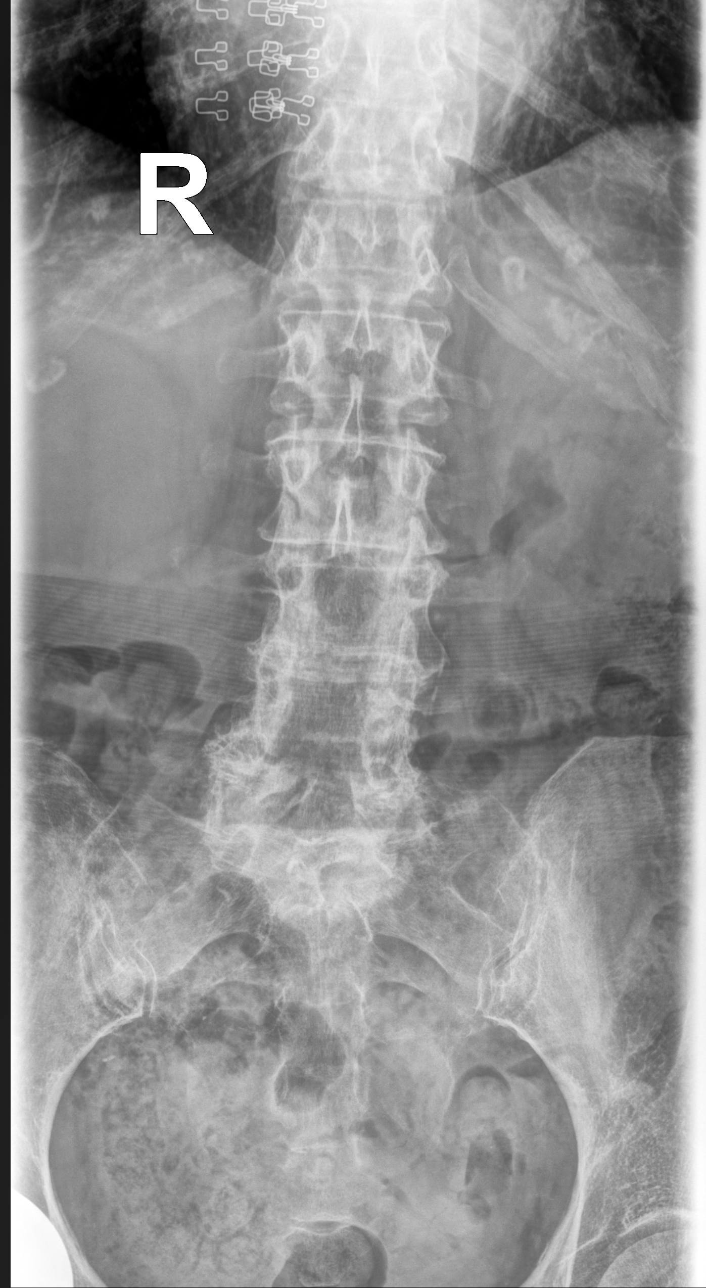

[lumbar spine mlo (1 of 2)]
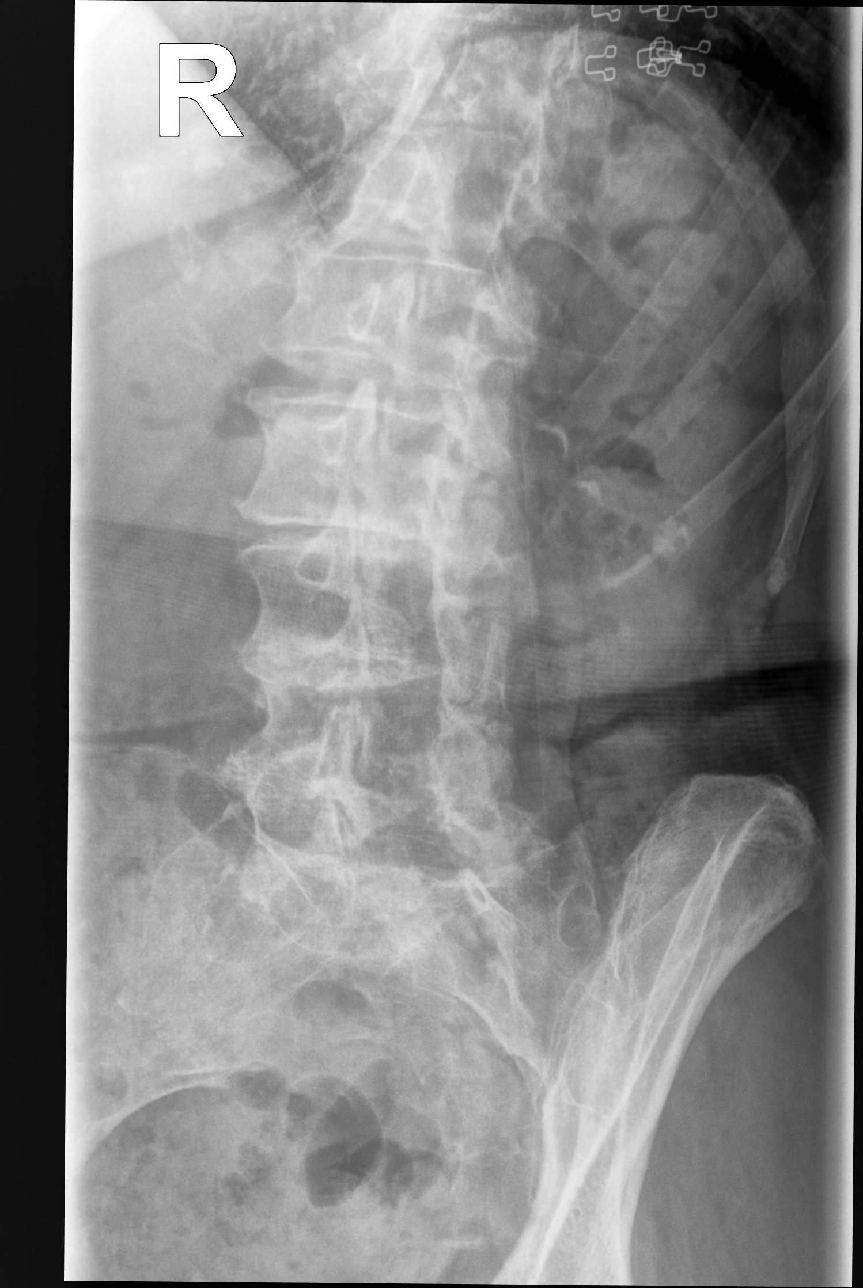

[lumbar spine mlo (2 of 2)]
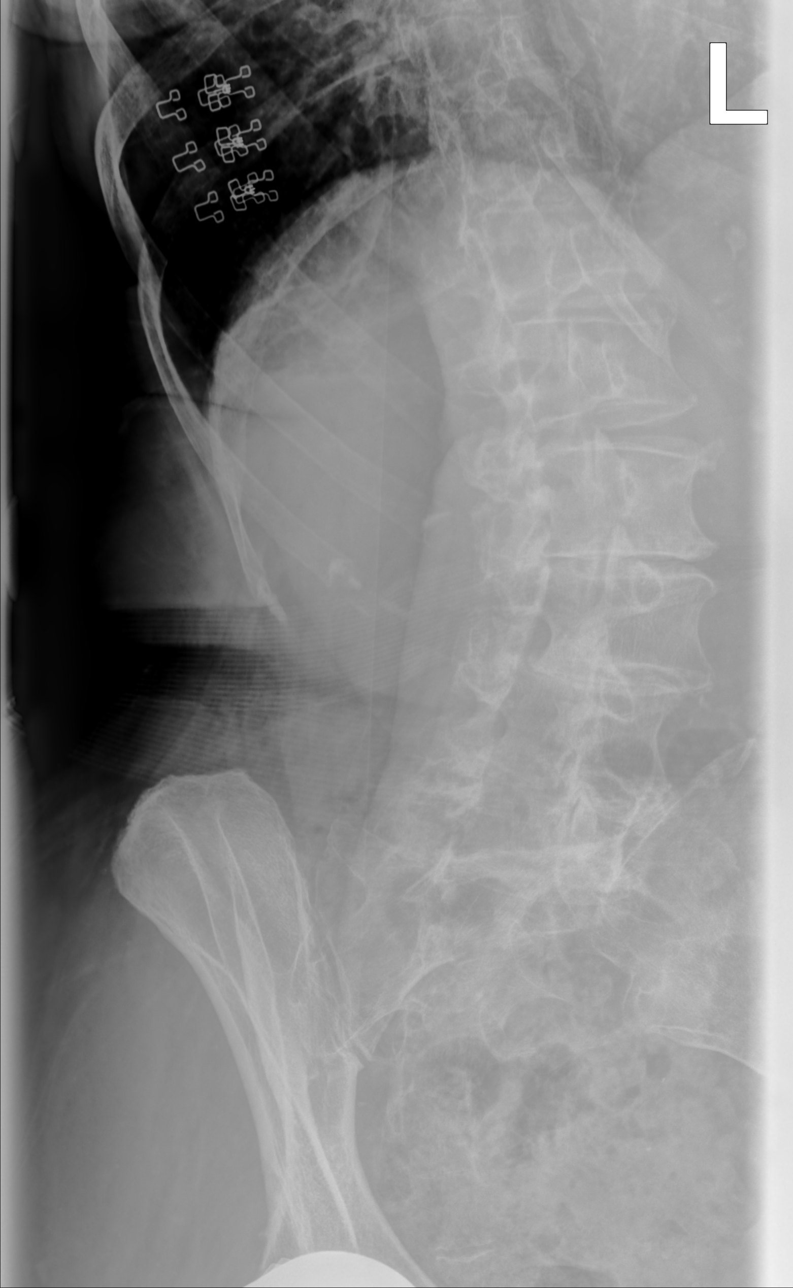

[lumbar spine lat (1 of 2)]
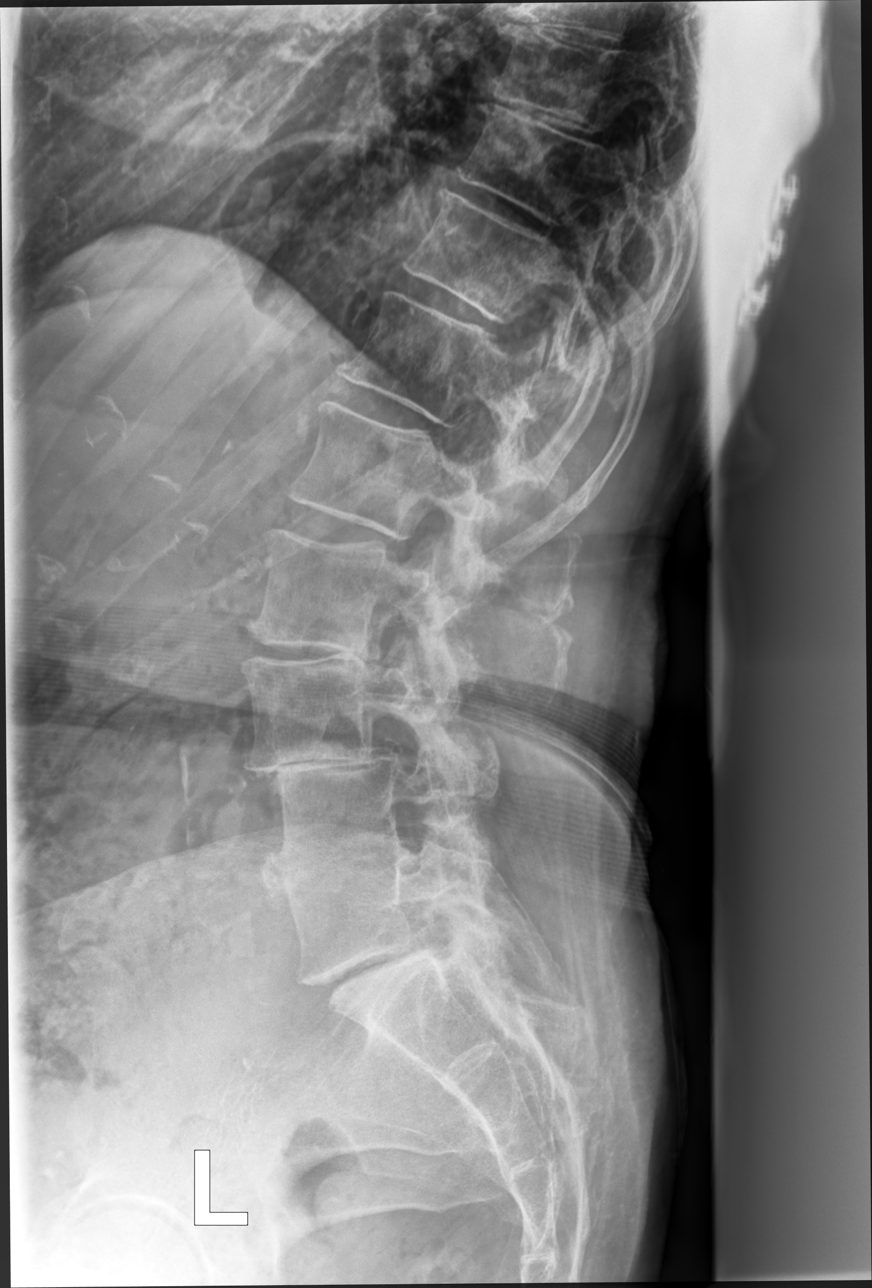

[lumbar spine lat (2 of 2)]
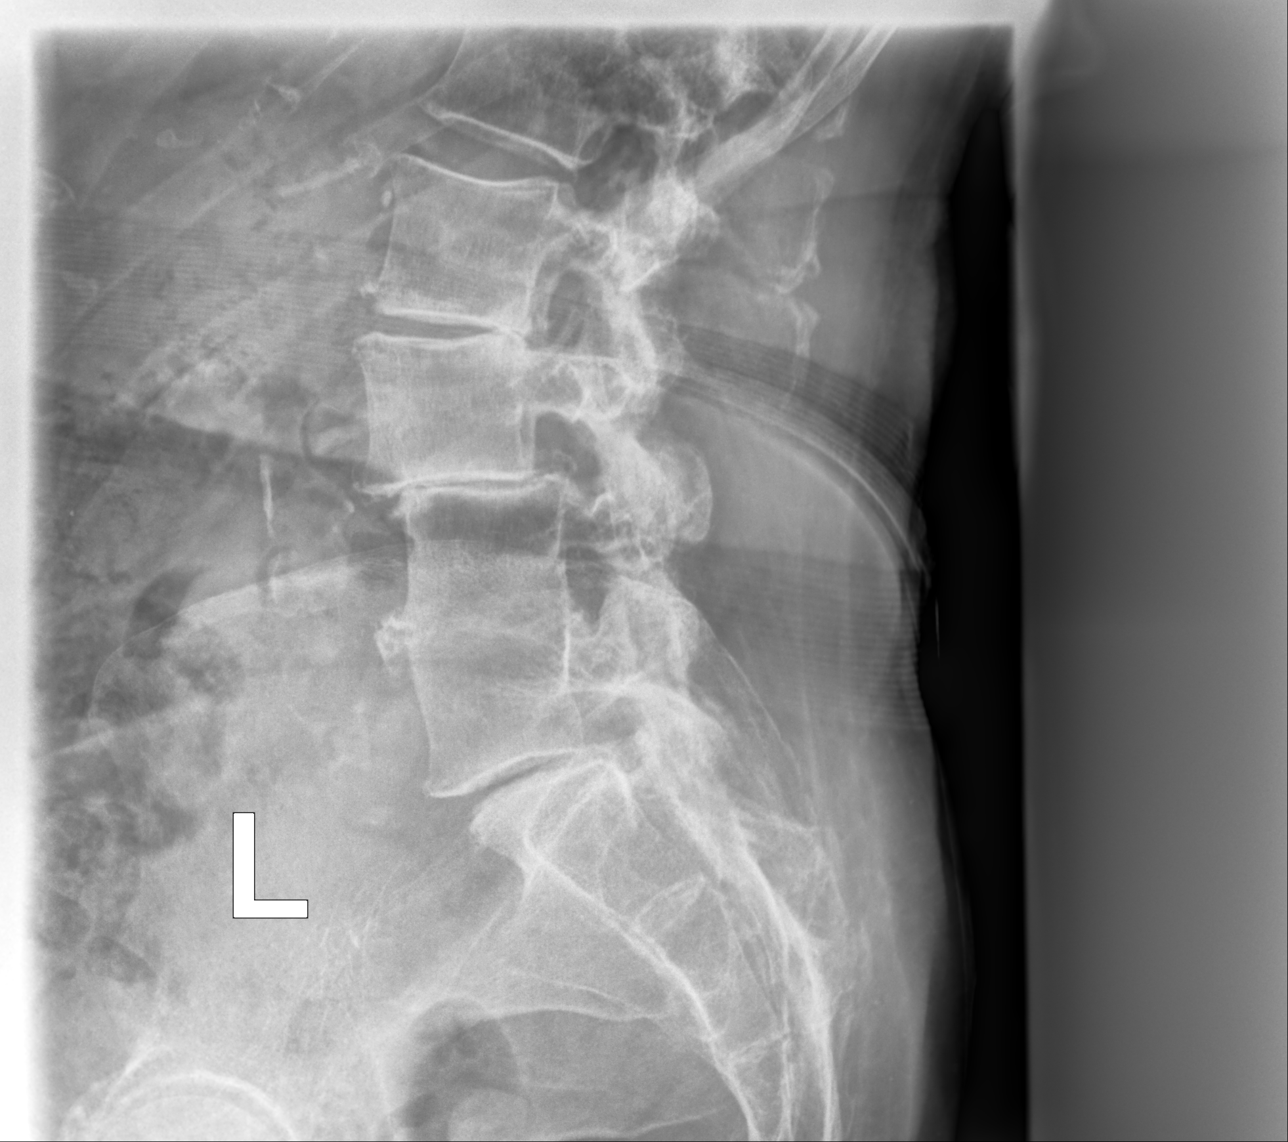

[5 of 5 positions shown; findings below may reference images not displayed]

FINDINGS: There is evidence of patient's previous laminectomy from L3-L5.
There is mild spondylosis of the lumbar spine to include moderate
facet arthropathy. There is a grade 1 anterolisthesis of L3 on L4
likely due to the moderate facet arthropathy. Subtle grade 1
anterolisthesis of L5 on S1. Obliteration of the L4-5 disc space
which may be due to partial fusion. Significant disc space narrowing
at the L2-3, L3-4 and L5-S1 levels. No evidence of compression
fracture.
IMPRESSION: 1. Mild spondylosis throughout the lumbar spine with significant
multilevel disc disease most prominent at the L3-4 L4-5 levels.

2. Previous laminectomy from L3-L5. Grade 1 anterolisthesis of L3 on
L4 and subtle grade 1 anterolisthesis of L5 on S1.

## 2020-06-19 ENCOUNTER — Other Ambulatory Visit: Payer: Self-pay

## 2020-06-19 ENCOUNTER — Telehealth: Payer: Self-pay | Admitting: Internal Medicine

## 2020-06-19 ENCOUNTER — Other Ambulatory Visit (INDEPENDENT_AMBULATORY_CARE_PROVIDER_SITE_OTHER): Payer: Medicare Other

## 2020-06-19 ENCOUNTER — Ambulatory Visit (INDEPENDENT_AMBULATORY_CARE_PROVIDER_SITE_OTHER)
Admission: RE | Admit: 2020-06-19 | Discharge: 2020-06-19 | Disposition: A | Discharge status: Home / Self Care | Payer: Medicare Other | Source: Ambulatory Visit | Attending: Internal Medicine | Admitting: Internal Medicine

## 2020-06-19 DIAGNOSIS — R1013 Epigastric pain: Secondary | ICD-10-CM

## 2020-06-19 DIAGNOSIS — R1010 Upper abdominal pain, unspecified: Secondary | ICD-10-CM

## 2020-06-19 DIAGNOSIS — R109 Unspecified abdominal pain: Secondary | ICD-10-CM | POA: Diagnosis not present

## 2020-06-19 LAB — CBC WITH DIFFERENTIAL/PLATELET
Basophils Absolute: 0 10*3/uL (ref 0.0–0.1)
Basophils Relative: 0.3 % (ref 0.0–3.0)
Eosinophils Absolute: 0.1 10*3/uL (ref 0.0–0.7)
Eosinophils Relative: 1.4 % (ref 0.0–5.0)
HCT: 39.2 % (ref 36.0–46.0)
Hemoglobin: 13.4 g/dL (ref 12.0–15.0)
Lymphocytes Relative: 25.8 % (ref 12.0–46.0)
Lymphs Abs: 1.5 10*3/uL (ref 0.7–4.0)
MCHC: 34.2 g/dL (ref 30.0–36.0)
MCV: 90.1 fl (ref 78.0–100.0)
Monocytes Absolute: 0.4 10*3/uL (ref 0.1–1.0)
Monocytes Relative: 7.6 % (ref 3.0–12.0)
Neutro Abs: 3.7 10*3/uL (ref 1.4–7.7)
Neutrophils Relative %: 64.9 % (ref 43.0–77.0)
Platelets: 247 10*3/uL (ref 150.0–400.0)
RBC: 4.35 Mil/uL (ref 3.87–5.11)
RDW: 12.8 % (ref 11.5–15.5)
WBC: 5.7 10*3/uL (ref 4.0–10.5)

## 2020-06-19 LAB — COMPREHENSIVE METABOLIC PANEL
ALT: 15 U/L (ref 0–35)
AST: 20 U/L (ref 0–37)
Albumin: 4.1 g/dL (ref 3.5–5.2)
Alkaline Phosphatase: 54 U/L (ref 39–117)
BUN: 15 mg/dL (ref 6–23)
CO2: 30 mEq/L (ref 19–32)
Calcium: 9.4 mg/dL (ref 8.4–10.5)
Chloride: 97 mEq/L (ref 96–112)
Creatinine, Ser: 0.67 mg/dL (ref 0.40–1.20)
GFR: 78.92 mL/min (ref >60.00)
Glucose, Bld: 95 mg/dL (ref 70–99)
Potassium: 3.9 mEq/L (ref 3.5–5.1)
Sodium: 134 mEq/L — ABNORMAL LOW (ref 135–145)
Total Bilirubin: 0.6 mg/dL (ref 0.2–1.2)
Total Protein: 7.7 g/dL (ref 6.0–8.3)

## 2020-06-19 LAB — LIPASE: Lipase: 43 U/L (ref 11.0–59.0)

## 2020-06-19 NOTE — Telephone Encounter (Signed)
Pt has not been seen since 2019, has h/o gastritis and collagenous colitis. Pt states she was taking protonix and budesonide but was able to come off of both of these. Sunday night she reports she started having pain in her abdomen from her rib cage down, the entire abdomen. This am she has a little nausea and her abdomen feels bloated but she is not passing gas. Her stools have been normal. Pt states she still has protonix and budesonide on hand. Pt wants to know what Dr. Hilarie Fredrickson recommends. Please advise.

## 2020-06-19 NOTE — Telephone Encounter (Signed)
Spoke with pt and she is aware of recommendations. Pt scheduled to see Dr. Bryan Lemma tomorrow at 1:20pm in [facility]. Pt aware of appt and location of office.

## 2020-06-19 NOTE — Telephone Encounter (Signed)
Pt is requesting a call back from a nurse to discuss her severe abdominal pain, pt has been experiencing this since last Sunday night.

## 2020-06-19 NOTE — Telephone Encounter (Signed)
Sorry to hear that she is having abdominal pain. She has not been seen since 2019. With diffuse abdominal pain it is difficult for me to know the etiology without further information. I do not have any availability in clinic the rest of the week, and there are no APP appointments this week or next. It is possible that I could ask one of my partners if he could see her tomorrow but first would like to start with CBC, CMP, lipase and a 2 v abd xray. Okay for her to resume pantoprazole 40 mg once daily for now.  I would not resume budesonide Thanks

## 2020-06-20 ENCOUNTER — Other Ambulatory Visit: Payer: Self-pay

## 2020-06-20 ENCOUNTER — Ambulatory Visit
Admission: RE | Admit: 2020-06-20 | Discharge: 2020-06-20 | Disposition: A | Discharge status: Home / Self Care | Payer: Medicare Other | Source: Ambulatory Visit | Attending: Internal Medicine | Admitting: Internal Medicine

## 2020-06-20 ENCOUNTER — Encounter: Payer: Self-pay | Admitting: Gastroenterology

## 2020-06-20 ENCOUNTER — Ambulatory Visit (INDEPENDENT_AMBULATORY_CARE_PROVIDER_SITE_OTHER): Payer: Medicare Other | Admitting: Gastroenterology

## 2020-06-20 VITALS — BP 134/80 | HR 89 | Ht 61.0 in | Wt 120.5 lb

## 2020-06-20 DIAGNOSIS — Z1231 Encounter for screening mammogram for malignant neoplasm of breast: Secondary | ICD-10-CM

## 2020-06-20 DIAGNOSIS — R519 Headache, unspecified: Secondary | ICD-10-CM

## 2020-06-20 DIAGNOSIS — R1084 Generalized abdominal pain: Secondary | ICD-10-CM | POA: Diagnosis not present

## 2020-06-20 DIAGNOSIS — R110 Nausea: Secondary | ICD-10-CM

## 2020-06-20 MED ORDER — SUCRALFATE 1 G PO TABS: 1.0000 g | ORAL_TABLET | Freq: Two times a day (BID) | ORAL | 0 refills | Status: DC

## 2020-06-20 NOTE — Patient Instructions (Addendum)
If you are age 85 or older, your body mass index should be between 23-30. Your Body mass index is 22.77 kg/m. If this is out of the aforementioned range listed, please consider follow up with your Primary Care Provider.  If you are age 60 or younger, your body mass index should be between 19-25. Your Body mass index is 22.77 kg/m. If this is out of the aformentioned range listed, please consider follow up with your Primary Care Provider.   We have sent the following medications to your pharmacy for you to pick up at your convenience:  Carafate 1gm bid for 2weeks  Due to recent changes in healthcare laws, you may see the results of your imaging and laboratory studies on MyChart before your provider has had a chance to review them.  We understand that in some cases there may be results that are confusing or concerning to you. Not all laboratory results come back in the same time frame and the provider may be waiting for multiple results in order to interpret others.  Please give [place] 48 hours in order for your provider to thoroughly review all the results before contacting the office for clarification of your results.   Follow up in clinic in 3 months with Dr Hilarie Fredrickson

## 2020-06-20 NOTE — Progress Notes (Signed)
P  Chief Complaint:    Abdominal pain, Nausea  HPI:     Patient is a 87 y.o. female with a history of gastric ulcer, gastritis, IDA, collagenous colitis, presenting to the [facility] for evaluation of pain.  She typically follows with Dr. Hilarie Fredrickson, presents today for acute work-in appointment.  Last seen by Dr. Hilarie Fredrickson on 06/17/2017.  Has since titrated off budesonide and off Protonix. Has been doing well since then without any GI issues.   She reports acute onset, generalized, sharp abdominal pain starting 4 days ago with some nausea without emesis, bloating, and headache. Does report a hx of migraines, but hadnt had one in years, but this headache felt similar to previous migraines and did not improve with Tramadol. Home BP the followign AM (still with headache and pain) was 202/96, which improved to 174/97, 159/86 and has since normalized.  No change in stools, hematochezia, melena. No fever, chills.   Restarted her Protonix 40 mg/day about 3 days ago.  Labs yesterday with otherwise normal CBC, CMP, lipase.   Today, feels a little better. Headache better, but not reoslved. Abd pain improved.  Tolerating p.o. intake.  Does have a history of chronic pain and follows Dr. Alcide Evener- takes Tramadol prn but no NSAIDs.  Otherwise remains very active, walking 1-2.5 miles daily.   Review of systems:     No chest pain, no SOB, no fevers, no urinary sx   Past Medical History:  Diagnosis Date  . Anemia 11/2015  . Arthritis   . Blood transfusion 2017 JULY 22  . Carotid stenosis    Dopplers 1/61: RICA 09-60%, LICA 4-54%  . Cataract    REMOVED  . Chronic headaches    HX MIGRAINES  . Collagenous colitis   . Diverticulosis 2013   Colonoscopy  . Gastric ulcer 11/2015  . Glaucoma    BOTH EYES  . History of spinal stenosis   . History of uterine fibroid   . Hx of cardiac catheterization    LHC 3/14: no angiographic CAD, EF 65%  . Hx of echocardiogram    Echocardiogram 06/21/12:  Mild LVH, EF 09-81%, grade 2 diastolic dysfunction, mild MR  . Hyperlipidemia   . Hypertension   . Hypothyroidism   . Internal hemorrhoids 2009   Colonoscopy  . UTI (urinary tract infection) HX OF    Patient's surgical history, family medical history, social history, medications and allergies were all reviewed in Epic    Current Outpatient Medications  Medication Sig Dispense Refill  . atorvastatin (LIPITOR) 10 MG tablet Take 0.5 tablets (5 mg total) by mouth at bedtime. 45 tablet 3  . Cholecalciferol (VITAMIN D3 SUPER STRENGTH) 50 MCG (2000 UT) TABS Take 2,000 Units by mouth daily.    . clobetasol ointment (TEMOVATE) 1.91 % Apply 1 application topically once a week.    . conjugated estrogens (PREMARIN) vaginal cream Place 1 Applicatorful vaginally daily.    Marland Kitchen estradiol (ESTRACE) 0.5 MG tablet Take 0.5 mg by mouth at bedtime.    . hydrochlorothiazide (HYDRODIURIL) 12.5 MG tablet hydrochlorothiazide 12.5 mg tablet  TAKE 1 TABLET BY MOUTH EVERY DAY    . latanoprost (XALATAN) 0.005 % ophthalmic solution Place 1 drop into both eyes at bedtime.    Marland Kitchen levothyroxine (SYNTHROID, LEVOTHROID) 125 MCG tablet Take 125 mcg by mouth daily.    Marland Kitchen losartan (COZAAR) 100 MG tablet losartan 100 mg tablet  TAKE 1 TABLET BY MOUTH EVERY DAY    . Multiple Vitamin (MULTIVITAMIN)  tablet Take 1 tablet by mouth daily with lunch.    . traMADol (ULTRAM) 50 MG tablet Take 50 mg by mouth every 6 (six) hours as needed.    . Turmeric (QC TUMERIC COMPLEX PO) Take 1,500 Units by mouth daily.    . vitamin C (ASCORBIC ACID) 500 MG tablet Take 500 mg by mouth daily with lunch.     No current facility-administered medications for this visit.    Physical Exam:     BP 134/80   Pulse 89   Ht 5\' 1"  (1.549 m)   Wt 120 lb 8 oz (54.7 kg)   BMI 22.77 kg/m   GENERAL:  Pleasant female in NAD PSYCH: : Cooperative, normal affect EENT:  conjunctiva pink, mucous membranes moist, neck supple without masses CARDIAC:  RRR, no  murmur heard, no peripheral edema PULM: Normal respiratory effort, lungs CTA bilaterally, no wheezing ABDOMEN: Mild TTP in MEG.  No rebound or guarding.  No peritoneal signs.  Nondistended, soft. No obvious masses, no hepatomegaly,  normal bowel sounds SKIN:  turgor, no lesions seen Musculoskeletal:  Normal muscle tone, normal strength NEURO: Alert and oriented x 3, no focal neurologic deficits   IMPRESSION and PLAN:    1) Generalized abdominal pain 2) Nausea without emesis 3) Migraine headaches 4) Hypertension  Acute onset abdominal pain in the setting of significant headache and elevated blood pressure.  Unsure if the blood pressure is elevated as a result of headache or as a cause of the headache, but I asked her to follow-up closely with her PCP, Dr. Lyndel Safe.  Discussed potential overlap with abdominal pain, to include possible intestinal migraine, vasospasm, vascular pathology.given her own clinical history, also discussed alternate etiology such as PUD, gastritis, etc. Discussed expedited EGD for diagnostic intent and/or cross-sectional imaging, labs.  Given her overall clinical improvement in the last 24-48 hours, she would like to proceed with more conservative approach.  -Resume Protonix - Start Carafate -Offered trial of Imitrex, but she elected to hold off - If ongoing/worsening pain, patient knows to call the GI clinic and we will plan for CT/CTA and expedited EGD -To follow-up with her PCP re: elevated BP, migraines  RTC in 2 months or sooner as needed         Tselakai Dezza ,DO, FACG 06/20/2020, 1:25 PM

## 2020-07-26 ENCOUNTER — Other Ambulatory Visit: Payer: Self-pay

## 2020-07-26 ENCOUNTER — Encounter: Payer: Self-pay | Admitting: Internal Medicine

## 2020-07-26 ENCOUNTER — Non-Acute Institutional Stay: Payer: Medicare Other | Admitting: Internal Medicine

## 2020-07-26 VITALS — BP 148/92 | HR 63 | Temp 97.5°F | Ht 61.0 in | Wt 121.6 lb

## 2020-07-26 DIAGNOSIS — E039 Hypothyroidism, unspecified: Secondary | ICD-10-CM

## 2020-07-26 DIAGNOSIS — E785 Hyperlipidemia, unspecified: Secondary | ICD-10-CM | POA: Diagnosis not present

## 2020-07-26 DIAGNOSIS — K2950 Unspecified chronic gastritis without bleeding: Secondary | ICD-10-CM | POA: Diagnosis not present

## 2020-07-26 DIAGNOSIS — I10 Essential (primary) hypertension: Secondary | ICD-10-CM

## 2020-07-26 DIAGNOSIS — R1084 Generalized abdominal pain: Secondary | ICD-10-CM

## 2020-07-26 DIAGNOSIS — N952 Postmenopausal atrophic vaginitis: Secondary | ICD-10-CM | POA: Diagnosis not present

## 2020-07-26 DIAGNOSIS — M1990 Unspecified osteoarthritis, unspecified site: Secondary | ICD-10-CM | POA: Diagnosis not present

## 2020-07-26 NOTE — Patient Instructions (Signed)
Start Pepcid 20 mg QD But if Symptoms return restart Protonix till you see Dr Hilarie Fredrickson.  Check Your BP everyday and Q weekly with Nurse in facility

## 2020-07-26 NOTE — Progress Notes (Signed)
Location: [place] of Service:  Clinic (12)  Provider:   Code Status:  Goals of Care:  Advanced Directives 11/01/2018  Does Patient Have a Medical Advance Directive? No  Would patient like information on creating a medical advance directive? No - Patient declined  Pre-existing out of facility DNR order (yellow form or pink MOST form) -     Chief Complaint  Patient presents with  . Medical Management of Chronic Issues    Patient returns to the clinic for follow up.     HPI: Patient is a 87 y.o. female seen today for an acute visit for Follow up of Abdominal Pain and Hypertension  Patient has a history of hypertension, arthritis, hypothyroidism, atrophic vaginitis, hyperlipidemia and hyponatremia Patient does have chronic pain and follows with Dr. Alcide Evener for her opioid prescriptions  Patient also has a history of gastric ulcer, gastritis and collagenous colitis. Had tapered herself off Protonix and budesonide She had acute onset of generalized sharp abdominal pain related was seen by GI.  She had labs done including CBC CMP and lipase which were all normal. The GI started her on Protonix and Carafate. During that time patient also had headache and was taking tramadol.  Also had Systolic  blood pressures some as high as 200. Patient said that her  abdominal pain got better after taking Protonix for few days. She has not had any abdominal pain.  She again has stopped her Protonix after taking them for 3 weeks Her blood pressure at home has been in good range.  Her systolic blood pressure has been running between 1 20-1 50. No More HEadaches Past Medical History:  Diagnosis Date  . Anemia 11/2015  . Arthritis   . Blood transfusion 2017 JULY 22  . Carotid stenosis    Dopplers 6/94: RICA 85-46%, LICA 2-70%  . Cataract    REMOVED  . Chronic headaches    HX MIGRAINES  . Collagenous colitis   . Diverticulosis 2013   Colonoscopy  . Gastric ulcer 11/2015   . Glaucoma    BOTH EYES  . History of spinal stenosis   . History of uterine fibroid   . Hx of cardiac catheterization    LHC 3/14: no angiographic CAD, EF 65%  . Hx of echocardiogram    Echocardiogram 06/21/12: Mild LVH, EF 35-00%, grade 2 diastolic dysfunction, mild MR  . Hyperlipidemia   . Hypertension   . Hypothyroidism   . Internal hemorrhoids 2009   Colonoscopy  . UTI (urinary tract infection) HX OF    Past Surgical History:  Procedure Laterality Date  . BUNIONECTOMY  YRS AGO LEFT   RIGHT [facility] 3 YRS AGO  . BUNIONECTOMY Left   . CATARACT EXTRACTION W/ INTRAOCULAR LENS IMPLANT  03/2010   Left  . CATARACT EXTRACTION W/ INTRAOCULAR LENS IMPLANT Bilateral 11/2010  . COLONOSCOPY  2013   NORMAL   . ESOPHAGOGASTRODUODENOSCOPY N/A 11/23/2015   Procedure: ESOPHAGOGASTRODUODENOSCOPY (EGD);  Surgeon: Rogene Houston, MD;  Location: [facility];  Service: Endoscopy;  Laterality: N/A;  . EUS N/A 12/19/2015   Procedure: ESOPHAGEAL ENDOSCOPIC ULTRASOUND (EUS) RADIAL;  Surgeon: Milus Banister, MD;  Location: [facility];  Service: Endoscopy;  Laterality: N/A;  . FUNCTIONAL ENDOSCOPIC SINUS SURGERY    . HAMMER TOE SURGERY    . HAND SURGERY Right 1998  . INGUINAL HERNIA REPAIR Left 03/11/2016   Procedure: LEFT INGUINAL HERINA REPAIR WITH MESH;  Surgeon: Donnie Mesa, MD;  Location: [facility];  Service: General;  Laterality: Left;  . INSERTION OF MESH Left 03/11/2016   Procedure: INSERTION OF MESH;  Surgeon: Donnie Mesa, MD;  Location: [place];  Service: General;  Laterality: Left;  . IR RADIOLOGIST EVAL & MGMT  07/01/2017  . LAMINECTOMY  03/2008  . LAPAROTOMY N/A 11/11/2017   Procedure: MINI LAPAROTOMY;  Surgeon: Everitt Amber, MD;  Location: [facility];  Service: Gynecology;  Laterality: N/A;  . POLYPECTOMY    . REVERSE SHOULDER ARTHROPLASTY Right 10/13/2018   Procedure: REVERSE SHOULDER ARTHROPLASTY;  Surgeon: Justice Britain, MD;  Location: [facility];  Service: Orthopedics;  Laterality:  Right;  189min  . ROBOTIC ASSISTED TOTAL HYSTERECTOMY WITH BILATERAL SALPINGO OOPHERECTOMY Bilateral 11/11/2017   Procedure: XI ROBOTIC ASSISTED TOTAL HYSTERECTOMY WITH BILATERAL SALPINGO OOPHORECTOMY FOR UTERUS GREATER THAN 250 GRAMS;  Surgeon: Everitt Amber, MD;  Location: [facility];  Service: Gynecology;  Laterality: Bilateral;  . TONSILLECTOMY    . TOTAL HIP ARTHROPLASTY  2008   Right    No Known Allergies  Outpatient Encounter Medications as of 07/26/2020  Medication Sig  . atorvastatin (LIPITOR) 10 MG tablet Take 0.5 tablets (5 mg total) by mouth at bedtime.  . Cholecalciferol (VITAMIN D3 SUPER STRENGTH) 50 MCG (2000 UT) TABS Take 2,000 Units by mouth daily.  . clobetasol ointment (TEMOVATE) 5.39 % Apply 1 application topically once a week.  . conjugated estrogens (PREMARIN) vaginal cream Place 1 Applicatorful vaginally daily.  Marland Kitchen estradiol (ESTRACE) 0.5 MG tablet Take 0.5 mg by mouth at bedtime.  . hydrochlorothiazide (HYDRODIURIL) 12.5 MG tablet hydrochlorothiazide 12.5 mg tablet  TAKE 1 TABLET BY MOUTH EVERY DAY  . latanoprost (XALATAN) 0.005 % ophthalmic solution Place 1 drop into both eyes at bedtime.  Marland Kitchen levothyroxine (SYNTHROID, LEVOTHROID) 125 MCG tablet Take 125 mcg by mouth daily.  Marland Kitchen losartan (COZAAR) 100 MG tablet losartan 100 mg tablet  TAKE 1 TABLET BY MOUTH EVERY DAY  . Multiple Vitamin (MULTIVITAMIN) tablet Take 1 tablet by mouth daily with lunch.  . Turmeric (QC TUMERIC COMPLEX PO) Take 1,500 Units by mouth daily.  . vitamin C (ASCORBIC ACID) 500 MG tablet Take 500 mg by mouth daily with lunch.  . [DISCONTINUED] sucralfate (CARAFATE) 1 g tablet Take 1 tablet (1 g total) by mouth 2 (two) times daily for 14 days. (Patient not taking: Reported on 07/26/2020)  . [DISCONTINUED] traMADol (ULTRAM) 50 MG tablet Take 50 mg by mouth every 6 (six) hours as needed. (Patient not taking: Reported on 07/26/2020)   No facility-administered encounter medications on file as of 07/26/2020.     Review of Systems:  Review of Systems  Review of Systems  Constitutional: Negative for activity change, appetite change, chills, diaphoresis, fatigue and fever.  HENT: Negative for mouth sores, postnasal drip, rhinorrhea, sinus pain and sore throat.   Respiratory: Negative for apnea, cough, chest tightness, shortness of breath and wheezing.   Cardiovascular: Negative for chest pain, palpitations and leg swelling.  Gastrointestinal: Negative for abdominal distention, abdominal pain, constipation, diarrhea, nausea and vomiting.  Genitourinary: Negative for dysuria and frequency.  Musculoskeletal: Negative for arthralgias, joint swelling and myalgias.  Skin: Negative for rash.  Neurological: Negative for dizziness, syncope, weakness, light-headedness and numbness.  Psychiatric/Behavioral: Negative for behavioral problems, confusion and sleep disturbance.     Health Maintenance  Topic Date Due  . COVID-19 Vaccine (3 - Booster for Pfizer series) 12/17/2019  . TETANUS/TDAP  06/13/2030  . INFLUENZA VACCINE  Completed  . DEXA SCAN  Completed  .  PNA vac Low Risk Adult  Completed  . HPV VACCINES  Aged Out    Physical Exam: Vitals:   07/26/20 0957  BP: (!) 148/92  Pulse: 63  Temp: (!) 97.5 F (36.4 C)  SpO2: 100%  Weight: 121 lb 9.6 oz (55.2 kg)  Height: 5\' 1"  (1.549 m)   Body mass index is 22.98 kg/m. Physical Exam Constitutional: Oriented to person, place, and time. Well-developed and well-nourished.  HENT:  Head: Normocephalic.  Mouth/Throat: Oropharynx is clear and moist.  Eyes: Pupils are equal, round, and reactive to light.  Neck: Neck supple.  Cardiovascular: Normal rate and normal heart sounds.  No murmur heard. Pulmonary/Chest: Effort normal and breath sounds normal. No respiratory distress. No wheezes. She has no rales.  Abdominal: Soft. Bowel sounds are normal. No distension. There is no tenderness. There is no rebound.  Musculoskeletal: No edema.   Lymphadenopathy: none Neurological: Alert and oriented to person, place, and time.  Skin: Skin is warm and dry.  Psychiatric: Normal mood and affect. Behavior is normal. Thought content normal.   Labs reviewed: Basic Metabolic Panel: Recent Labs    08/29/19 1002 02/27/20 0740 06/19/20 1348  NA 131* 138 134*  K 4.2 4.3 3.9  CL 96 101 97  CO2 32 31 30  GLUCOSE 81 93 95  BUN 17 15 15   CREATININE 0.64 0.61 0.67  CALCIUM 9.3  9.1 9.4 9.4  TSH 3.09 2.43  --    Liver Function Tests: Recent Labs    08/29/19 1002 02/27/20 0740 06/19/20 1348  AST 27 23 20   ALT 19 16 15   ALKPHOS 48  --  54  BILITOT 0.7 0.7 0.6  PROT 7.5 6.8 7.7  ALBUMIN 4.1  --  4.1   Recent Labs    06/19/20 1348  LIPASE 43.0   No results for input(s): AMMONIA in the last 8760 hours. CBC: Recent Labs    08/29/19 1620 02/27/20 0740 06/19/20 1348  WBC 5.5 4.4 5.7  NEUTROABS 2.2 1,857 3.7  HGB 12.4 12.6 13.4  HCT 36.9 38.2 39.2  MCV 91.6 92.9 90.1  PLT 257.0 253 247.0   Lipid Panel: Recent Labs    02/27/20 0740  CHOL 150  HDL 60  LDLCALC 75  TRIG 69  CHOLHDL 2.5   No results found for: HGBA1C  Procedures since last visit: No results found.  Assessment/Plan 1. Primary hypertension BP still Mildily Elevated. She is Reluctant to start a new Med at this time She has been keeping Log at home and her BP has been doing well Discussed various options She will keep the monitor And also have told her to check BP with Nurse in Facility Cut back Salt intake in her Diet. Hard to do due to Facility food. Will Come for Follow up in 4-6 weeks for BP review Can consider Norvasc if BP more then 150/90  2 Headaches Does have h/o Migraines Says now resolved Does not want any new Meds  3. Generalized abdominal pain Was seen by GI  EGD in 2017 Her symptoms resolved after being on Protonix for 3 weeks. I told her to continue Protonix till she sees Dr. Hilarie Fredrickson  in In May. She was not sure about  that. Other option is that she can start taking Pepcid 20 mg daily   4. Chronic gastritis without bleeding, unspecified gastritis type Has taken herself off Protonix again told her to restart Protonix or take Pepcid Has follow-up with GI  5. Hyperlipidemia, unspecified hyperlipidemia type  Repeat LDL pending  6. Hypothyroidism, unspecified type TSH Normal in 10/21  7. Atrophic vaginitis On PO estrogen  8. Osteoarthritis, unspecified osteoarthritis type, unspecified site On Tramadol per Dr Nelva Bush    Labs/tests ordered:  * No order type specified * Next appt:  09/05/2020

## 2020-08-14 DIAGNOSIS — H524 Presbyopia: Secondary | ICD-10-CM | POA: Diagnosis not present

## 2020-08-14 DIAGNOSIS — H401131 Primary open-angle glaucoma, bilateral, mild stage: Secondary | ICD-10-CM | POA: Diagnosis not present

## 2020-08-23 ENCOUNTER — Other Ambulatory Visit: Payer: Self-pay | Admitting: *Deleted

## 2020-08-23 MED ORDER — ESTRADIOL 0.5 MG PO TABS: 0.5000 mg | ORAL_TABLET | Freq: Every day | ORAL | 1 refills | Status: DC

## 2020-08-23 NOTE — Telephone Encounter (Signed)
Patient requested refill

## 2020-09-05 ENCOUNTER — Other Ambulatory Visit: Payer: Self-pay

## 2020-09-05 ENCOUNTER — Encounter: Payer: Self-pay | Admitting: Nurse Practitioner

## 2020-09-05 ENCOUNTER — Non-Acute Institutional Stay: Payer: Medicare Other | Admitting: Nurse Practitioner

## 2020-09-05 DIAGNOSIS — E785 Hyperlipidemia, unspecified: Secondary | ICD-10-CM

## 2020-09-05 DIAGNOSIS — M1990 Unspecified osteoarthritis, unspecified site: Secondary | ICD-10-CM

## 2020-09-05 DIAGNOSIS — N952 Postmenopausal atrophic vaginitis: Secondary | ICD-10-CM

## 2020-09-05 DIAGNOSIS — I10 Essential (primary) hypertension: Secondary | ICD-10-CM

## 2020-09-05 DIAGNOSIS — K922 Gastrointestinal hemorrhage, unspecified: Secondary | ICD-10-CM | POA: Diagnosis not present

## 2020-09-05 DIAGNOSIS — E039 Hypothyroidism, unspecified: Secondary | ICD-10-CM | POA: Diagnosis not present

## 2020-09-05 DIAGNOSIS — E871 Hypo-osmolality and hyponatremia: Secondary | ICD-10-CM

## 2020-09-05 DIAGNOSIS — E559 Vitamin D deficiency, unspecified: Secondary | ICD-10-CM

## 2020-09-05 NOTE — Assessment & Plan Note (Signed)
Atrophic vaginitis, takes Estradiol 

## 2020-09-05 NOTE — Assessment & Plan Note (Signed)
Vit D deficiency, takes Vit D, Vit D level 85 08/29/19  

## 2020-09-05 NOTE — Assessment & Plan Note (Signed)
Hyperlipidemia, takes Atorvastatin, LDL 75 02/27/20

## 2020-09-05 NOTE — Assessment & Plan Note (Signed)
takes Losartan, HCTZ, Bun/creat 15/0.67 06/19/20, update CMP/eGFR

## 2020-09-05 NOTE — Assessment & Plan Note (Addendum)
Hypothyroidism, takes Levothyroxine, TSH 2.43 02/27/20, f/u TSH

## 2020-09-05 NOTE — Assessment & Plan Note (Addendum)
Na 134 06/29/20, update CMP/eGFR

## 2020-09-05 NOTE — Assessment & Plan Note (Signed)
not taking meds.

## 2020-09-05 NOTE — Progress Notes (Signed)
Location:   clinic [place]   Place of Service:  Clinic (12) Provider: Marlana Latus NP  Code Status: DNR Goals of Care: [place] 09/05/2020  Does Patient Have a Medical Advance Directive? Yes  Type of Advance Directive [place]  Does patient want to make changes to medical advance directive? No - Patient declined  Copy of [place] in Chart? Yes - validated most recent copy scanned in chart (See row information)  Would patient like information on creating a medical advance directive? -  Pre-existing out of facility DNR order (yellow form or pink MOST form) -     Chief Complaint  Patient presents with  . Medical Management of Chronic Issues    6 week follow up.     HPI: Patient is a 87 y.o. female seen today for medical management of chronic diseases.    HTN, takes Losartan, HCTZ, Bun/creat 15/0.67 06/19/20             OA, not taking meds.              Hypothyroidism, takes Levothyroxine, TSH 2.43 02/27/20             Atrophic vaginitis, takes Estradiol              Vit D deficiency, takes Vit D, Vit D level 85 08/29/19             Hyperlipidemia, takes Atorvastatin, LDL 75 02/27/20             Hyponatremia, Na 134 06/29/20  Hx of UGI/PUD, f/u GI 10/26/20, off acid reducers. Asymptomatic today.   Past Medical History:  Diagnosis Date  . Anemia 11/2015  . Arthritis   . Blood transfusion 2017 JULY 22  . Carotid stenosis    Dopplers 8/03: RICA 21-22%, LICA 4-82%  . Cataract    REMOVED  . Chronic headaches    HX MIGRAINES  . Collagenous colitis   . Diverticulosis 2013   Colonoscopy  . Gastric ulcer 11/2015  . Glaucoma    BOTH EYES  . History of spinal stenosis   . History of uterine fibroid   . Hx of cardiac catheterization    LHC 3/14: no angiographic CAD, EF 65%  . Hx of echocardiogram    Echocardiogram 06/21/12: Mild LVH, EF 50-03%, grade 2 diastolic dysfunction, mild MR  . Hyperlipidemia   . Hypertension   .  Hypothyroidism   . Internal hemorrhoids 2009   Colonoscopy  . UTI (urinary tract infection) HX OF    Past Surgical History:  Procedure Laterality Date  . BUNIONECTOMY  YRS AGO LEFT   RIGHT [facility] 3 YRS AGO  . BUNIONECTOMY Left   . CATARACT EXTRACTION W/ INTRAOCULAR LENS IMPLANT  03/2010   Left  . CATARACT EXTRACTION W/ INTRAOCULAR LENS IMPLANT Bilateral 11/2010  . COLONOSCOPY  2013   NORMAL   . ESOPHAGOGASTRODUODENOSCOPY N/A 11/23/2015   Procedure: ESOPHAGOGASTRODUODENOSCOPY (EGD);  Surgeon: Rogene Houston, MD;  [facility];  Service: Endoscopy;  Laterality: N/A;  . EUS N/A 12/19/2015   Procedure: ESOPHAGEAL ENDOSCOPIC ULTRASOUND (EUS) RADIAL;  Surgeon: Milus Banister, MD;  Location: [facility];  Service: Endoscopy;  Laterality: N/A;  . FUNCTIONAL ENDOSCOPIC SINUS SURGERY    . HAMMER TOE SURGERY    . HAND SURGERY Right 1998  . INGUINAL HERNIA REPAIR Left 03/11/2016   Procedure: LEFT INGUINAL HERINA REPAIR WITH MESH;  Surgeon: Donnie Mesa, MD;  Location: [facility];  Service: General;  Laterality: Left;  . INSERTION OF MESH Left 03/11/2016   Procedure: INSERTION OF MESH;  Surgeon: Donnie Mesa, MD;  Location: [facility];  Service: General;  Laterality: Left;  . IR RADIOLOGIST EVAL & MGMT  07/01/2017  . LAMINECTOMY  03/2008  . LAPAROTOMY N/A 11/11/2017   Procedure: MINI LAPAROTOMY;  Surgeon: Everitt Amber, MD;  Location: [facility];  Service: Gynecology;  Laterality: N/A;  . POLYPECTOMY    . REVERSE SHOULDER ARTHROPLASTY Right 10/13/2018   Procedure: REVERSE SHOULDER ARTHROPLASTY;  Surgeon: Justice Britain, MD;  Location: [facility];  Service: Orthopedics;  Laterality: Right;  186mn  . ROBOTIC ASSISTED TOTAL HYSTERECTOMY WITH BILATERAL SALPINGO OOPHERECTOMY Bilateral 11/11/2017   Procedure: XI ROBOTIC ASSISTED TOTAL HYSTERECTOMY WITH BILATERAL SALPINGO OOPHORECTOMY FOR UTERUS GREATER THAN 250 GRAMS;  Surgeon: REveritt Amber MD;  Location: [facility];  Service: Gynecology;  Laterality:  Bilateral;  . TONSILLECTOMY    . TOTAL HIP ARTHROPLASTY  2008   Right    No Known Allergies  Allergies as of 09/05/2020   No Known Allergies     Medication List       Accurate as of Sep 05, 2020 11:59 PM. If you have any questions, ask your nurse or doctor.        atorvastatin 10 MG tablet Commonly known as: LIPITOR Take 0.5 tablets (5 mg total) by mouth at bedtime.   clobetasol ointment 0.05 % Commonly known as: TEMOVATE Apply 1 application topically once a week.   conjugated estrogens vaginal cream Commonly known as: PREMARIN Place 1 Applicatorful vaginally daily.   estradiol 0.5 MG tablet Commonly known as: ESTRACE Take 1 tablet (0.5 mg total) by mouth at bedtime.   hydrochlorothiazide 12.5 MG tablet Commonly known as: HYDRODIURIL hydrochlorothiazide 12.5 mg tablet  TAKE 1 TABLET BY MOUTH EVERY DAY   latanoprost 0.005 % ophthalmic solution Commonly known as: XALATAN Place 1 drop into both eyes at bedtime.   levothyroxine 125 MCG tablet Commonly known as: SYNTHROID Take 125 mcg by mouth daily.   losartan 100 MG tablet Commonly known as: COZAAR losartan 100 mg tablet  TAKE 1 TABLET BY MOUTH EVERY DAY   multivitamin tablet Take 1 tablet by mouth daily with lunch.   QC TUMERIC COMPLEX PO Take 1,500 Units by mouth daily.   vitamin C 500 MG tablet Commonly known as: ASCORBIC ACID Take 500 mg by mouth daily with lunch.   Vitamin D3 Super Strength 50 MCG (2000 UT) Tabs Generic drug: Cholecalciferol Take 2,000 Units by mouth daily.       Review of Systems:  Review of Systems  Constitutional: Negative for fatigue, fever and unexpected weight change.  HENT: Positive for hearing loss. Negative for congestion and voice change.   Eyes: Negative for visual disturbance.  Respiratory: Negative for cough and shortness of breath.   Cardiovascular: Negative for leg swelling.  Gastrointestinal: Negative for abdominal pain and constipation.       Left inguinal  hernia repair2017  Genitourinary: Negative for dysuria, frequency, urgency and vaginal discharge.       1-2x/night  Musculoskeletal: Positive for arthralgias and back pain.       S/p R shoulder replacement 2020. Lower back, right side sciatica, pain most at night and in am  Skin: Negative for color change.  Neurological: Negative for speech difficulty, light-headedness and headaches.  Psychiatric/Behavioral: Negative for behavioral problems and sleep disturbance. The patient is not nervous/anxious.     Health Maintenance  Topic  Date Due  . INFLUENZA VACCINE  12/02/2020  . TETANUS/TDAP  06/13/2030  . DEXA SCAN  Completed  . COVID-19 Vaccine  Completed  . PNA vac Low Risk Adult  Completed  . HPV VACCINES  Aged Out    Physical Exam: Vitals:   09/05/20 1334  BP: 128/70  Pulse: 73  Resp: 18  Temp: 97.8 F (36.6 C)  TempSrc: Temporal  SpO2: 98%  Weight: 128 lb 12.8 oz (58.4 kg)  Height: 5' 1"  (1.549 m)   Body mass index is 24.34 kg/m. Physical Exam Vitals reviewed.  Constitutional:      Appearance: Normal appearance.  HENT:     Head: Normocephalic and atraumatic.     Mouth/Throat:     Mouth: Mucous membranes are moist.  Eyes:     Extraocular Movements: Extraocular movements intact.     Conjunctiva/sclera: Conjunctivae normal.     Pupils: Pupils are equal, round, and reactive to light.     Comments: S/p R+L cataract extraction  Cardiovascular:     Rate and Rhythm: Normal rate and regular rhythm.     Heart sounds: No murmur heard.   Pulmonary:     Effort: Pulmonary effort is normal.     Breath sounds: No wheezing or rales.  Abdominal:     General: Bowel sounds are normal.     Palpations: Abdomen is soft.     Tenderness: There is no abdominal tenderness.     Comments: S/p left inguinal hernia repair  Musculoskeletal:     Cervical back: Normal range of motion and neck supple.     Right lower leg: No edema.     Left lower leg: No edema.     Comments: S/p R  shoulder replacement.   Skin:    General: Skin is warm and dry.     Comments: Surgical scar left inguinal hernia repair.   Neurological:     General: No focal deficit present.     Mental Status: She is alert and oriented to person, place, and time. Mental status is at baseline.     Motor: No weakness.     Coordination: Coordination normal.  Psychiatric:        Mood and Affect: Mood normal.        Behavior: Behavior normal.        Thought Content: Thought content normal.        Judgment: Judgment normal.     Labs reviewed: Basic Metabolic Panel: Recent Labs    02/27/20 0740 06/19/20 1348  NA 138 134*  K 4.3 3.9  CL 101 97  CO2 31 30  GLUCOSE 93 95  BUN 15 15  CREATININE 0.61 0.67  CALCIUM 9.4 9.4  TSH 2.43  --    Liver Function Tests: Recent Labs    02/27/20 0740 06/19/20 1348  AST 23 20  ALT 16 15  ALKPHOS  --  54  BILITOT 0.7 0.6  PROT 6.8 7.7  ALBUMIN  --  4.1   Recent Labs    06/19/20 1348  LIPASE 43.0   No results for input(s): AMMONIA in the last 8760 hours. CBC: Recent Labs    02/27/20 0740 06/19/20 1348  WBC 4.4 5.7  NEUTROABS 1,857 3.7  HGB 12.6 13.4  HCT 38.2 39.2  MCV 92.9 90.1  PLT 253 247.0   Lipid Panel: Recent Labs    02/27/20 0740  CHOL 150  HDL 60  LDLCALC 75  TRIG 69  CHOLHDL 2.5   No  results found for: HGBA1C  Procedures since last visit: No results found.  Assessment/Plan  UGI bleed Hx of UGI/PUD, f/u GI 10/26/20, off acid reducers. Asymptomatic today. Update CBC/diff.   HTN (hypertension) takes Losartan, HCTZ, Bun/creat 15/0.67 06/19/20, update CMP/eGFR   Osteoarthritis not taking meds.    Hypothyroidism Hypothyroidism, takes Levothyroxine, TSH 2.43 02/27/20, f/u TSH   Atrophic vaginitis Atrophic vaginitis, takes Estradiol   Vitamin D deficiency Vit D deficiency, takes Vit D, Vit D level 85 08/29/19   Hyperlipemia Hyperlipidemia, takes Atorvastatin, LDL 75 02/27/20   Hyponatremia Na 134  06/29/20, update CMP/eGFR    Labs/tests ordered:  CBC/diff, CMP/eGFR, TSH prior to the next appointment in 4-5 months.   Next appt:  4-5 months

## 2020-09-05 NOTE — Assessment & Plan Note (Addendum)
Hx of UGI/PUD, f/u GI 10/26/20, off acid reducers. Asymptomatic today. Update CBC/diff.

## 2020-09-06 ENCOUNTER — Encounter: Payer: Self-pay | Admitting: Nurse Practitioner

## 2020-09-06 DIAGNOSIS — G894 Chronic pain syndrome: Secondary | ICD-10-CM | POA: Diagnosis not present

## 2020-09-16 DIAGNOSIS — Z23 Encounter for immunization: Secondary | ICD-10-CM | POA: Diagnosis not present

## 2020-09-24 ENCOUNTER — Other Ambulatory Visit: Payer: Self-pay | Admitting: *Deleted

## 2020-09-24 MED ORDER — LOSARTAN POTASSIUM 100 MG PO TABS: ORAL_TABLET | ORAL | 1 refills | Status: DC

## 2020-09-24 NOTE — Telephone Encounter (Signed)
Received refill Request from [facility].

## 2020-09-25 DIAGNOSIS — Z01419 Encounter for gynecological examination (general) (routine) without abnormal findings: Secondary | ICD-10-CM | POA: Diagnosis not present

## 2020-09-25 DIAGNOSIS — Z01411 Encounter for gynecological examination (general) (routine) with abnormal findings: Secondary | ICD-10-CM | POA: Diagnosis not present

## 2020-09-25 DIAGNOSIS — Z6823 Body mass index (BMI) 23.0-23.9, adult: Secondary | ICD-10-CM | POA: Diagnosis not present

## 2020-09-25 DIAGNOSIS — Z124 Encounter for screening for malignant neoplasm of cervix: Secondary | ICD-10-CM | POA: Diagnosis not present

## 2020-10-01 ENCOUNTER — Other Ambulatory Visit: Payer: Self-pay

## 2020-10-01 MED ORDER — LEVOTHYROXINE SODIUM 125 MCG PO TABS: 125.0000 ug | ORAL_TABLET | Freq: Every day | ORAL | 1 refills | Status: DC

## 2020-10-02 ENCOUNTER — Encounter: Payer: Self-pay | Admitting: *Deleted

## 2020-10-04 DIAGNOSIS — M5416 Radiculopathy, lumbar region: Secondary | ICD-10-CM | POA: Diagnosis not present

## 2020-10-04 DIAGNOSIS — M5459 Other low back pain: Secondary | ICD-10-CM | POA: Diagnosis not present

## 2020-10-15 ENCOUNTER — Other Ambulatory Visit: Payer: Self-pay | Admitting: *Deleted

## 2020-10-15 MED ORDER — HYDROCHLOROTHIAZIDE 12.5 MG PO TABS: ORAL_TABLET | ORAL | 1 refills | Status: DC

## 2020-10-15 NOTE — Telephone Encounter (Signed)
Received refill Request from [facility].

## 2020-10-16 ENCOUNTER — Encounter: Payer: Self-pay | Admitting: Internal Medicine

## 2020-10-16 ENCOUNTER — Ambulatory Visit (INDEPENDENT_AMBULATORY_CARE_PROVIDER_SITE_OTHER): Payer: Medicare Other | Admitting: Internal Medicine

## 2020-10-16 VITALS — BP 130/64 | HR 80 | Ht 61.0 in | Wt 123.0 lb

## 2020-10-16 DIAGNOSIS — K2970 Gastritis, unspecified, without bleeding: Secondary | ICD-10-CM | POA: Diagnosis not present

## 2020-10-16 NOTE — Progress Notes (Signed)
Subjective:    Patient ID: Carol Wells, female    DOB: 1931-09-11, 87 y.o.   MRN: [id number]  HPI Carol Wells is an 87 year old female with history of gastritis and gastric ulcer with prior bleeding, IDA and collagenous colitis who returns for follow-up.  She is here today with her husband.  She was seen in urgent GI clinic by my partner, Dr. Bryan Lemma, on 06/20/2020 when she had developed upper abdominal pain and reflux type symptoms.  She was also having hypertension and headache around that time.  She was treated with pantoprazole and Carafate and had complete resolution of the symptoms.  Most recently she has been feeling very well.  No abdominal pain, nausea, vomiting, diarrhea.  No blood in stool or melena.  No heartburn.  She is now off of pantoprazole and Carafate.  Her blood pressure has also been better controlled of late.  No frequent headaches.  She very rarely will have food feel like it sticks in her esophagus transiently.  This is less than once per month.  It is not progressive.  She simply gives it time and it passes on its own.  She has moved into a independent home at [facility].  She lives with her husband.  They remain active.   Review of Systems As per HPI, otherwise negative  Current Medications, Allergies, Past Medical History, Past Surgical History, Family History and Social History were reviewed in [employer] record.    Objective:   Physical Exam BP 130/64   Pulse 80   Ht 5\' 1"  (1.549 m)   Wt 123 lb (55.8 kg)   BMI 23.24 kg/m  Gen: awake, alert, NAD HEENT: anicteric CV: RRR, no mrg Pulm: CTA b/l Abd: soft, NT/ND, +BS throughout Ext: no c/c/e Neuro: nonfocal  CBC    Component Value Date/Time   WBC 5.7 06/19/2020 1348   RBC 4.35 06/19/2020 1348   HGB 13.4 06/19/2020 1348   HCT 39.2 06/19/2020 1348   PLT 247.0 06/19/2020 1348   MCV 90.1 06/19/2020 1348   MCH 30.7 02/27/2020 0740   MCHC 34.2 06/19/2020 1348   RDW 12.8  06/19/2020 1348   LYMPHSABS 1.5 06/19/2020 1348   MONOABS 0.4 06/19/2020 1348   EOSABS 0.1 06/19/2020 1348   BASOSABS 0.0 06/19/2020 1348         Assessment & Plan:  87 year old female with history of gastritis and gastric ulcer with prior bleeding, IDA and collagenous colitis who returns for follow-up.    Epigastric pain/GERD/history of gastric ulcer and gastritis/small hiatal hernia --symptoms from February may have been an exacerbation of her reflux disease or gastritis.  Perhaps this was exacerbated by an acute viral infection.  Either way symptoms have resolved and responded quickly to 2 to 3 weeks of PPI and Carafate.  She is now off of PPI and Carafate. --She is doing very well, can remain off PPI and Carafate.  These can be used again in the future if needed --No role for upper endoscopy given her improvement  2.  History of IDA --no evidence for anemia when CBC was performed in February.  This issue has resolved and was likely related to her gastric ulcer and gastritis years ago.  3.  History of collagenous colitis --has been in remission for a number of years.  We are aware of this problem should diarrhea redevelop.  Follow-up as needed  20 minutes total spent today including patient facing time, coordination of care, reviewing medical history/procedures/pertinent  radiology studies, and documentation of the encounter.

## 2020-10-16 NOTE — Patient Instructions (Signed)
If you are age 85 or older, your body mass index should be between 23-30. Your Body mass index is 23.24 kg/m. If this is out of the aforementioned range listed, please consider follow up with your Primary Care Provider.  The [facility] providers would like to encourage you to use [facility] to communicate with providers for non-urgent requests or questions.  Due to long hold times on the telephone, sending your provider a message by [facility] may be a faster and more efficient way to get a response.  Please allow 48 business hours for a response.  Please remember that this is for non-urgent requests.   You will follow up with our office on an as needed basis.    Thank you for entrusting me with your care and choosing [facility].  Dr Hilarie Fredrickson

## 2020-10-26 DIAGNOSIS — M5459 Other low back pain: Secondary | ICD-10-CM | POA: Diagnosis not present

## 2020-10-26 DIAGNOSIS — M5416 Radiculopathy, lumbar region: Secondary | ICD-10-CM | POA: Diagnosis not present

## 2020-11-02 DIAGNOSIS — M48062 Spinal stenosis, lumbar region with neurogenic claudication: Secondary | ICD-10-CM | POA: Diagnosis not present

## 2020-11-02 DIAGNOSIS — Z20822 Contact with and (suspected) exposure to covid-19: Secondary | ICD-10-CM | POA: Diagnosis not present

## 2020-11-07 ENCOUNTER — Encounter: Payer: Self-pay | Admitting: Nurse Practitioner

## 2020-11-19 DIAGNOSIS — M5416 Radiculopathy, lumbar region: Secondary | ICD-10-CM | POA: Diagnosis not present

## 2020-11-21 ENCOUNTER — Other Ambulatory Visit: Payer: Self-pay | Admitting: Neurological Surgery

## 2020-12-12 ENCOUNTER — Other Ambulatory Visit: Payer: Self-pay

## 2020-12-12 ENCOUNTER — Encounter (HOSPITAL_COMMUNITY)
Admission: RE | Admit: 2020-12-12 | Discharge: 2020-12-12 | Disposition: A | Discharge status: Home / Self Care | Payer: Medicare Other | Source: Ambulatory Visit | Attending: Neurological Surgery | Admitting: Neurological Surgery

## 2020-12-12 ENCOUNTER — Encounter (HOSPITAL_COMMUNITY): Payer: Self-pay

## 2020-12-12 DIAGNOSIS — Z01818 Encounter for other preprocedural examination: Secondary | ICD-10-CM | POA: Diagnosis not present

## 2020-12-12 LAB — CBC WITH DIFFERENTIAL/PLATELET
Abs Immature Granulocytes: 0.02 10*3/uL (ref 0.00–0.07)
Basophils Absolute: 0 10*3/uL (ref 0.0–0.1)
Basophils Relative: 0 %
Eosinophils Absolute: 0.2 10*3/uL (ref 0.0–0.5)
Eosinophils Relative: 3 %
HCT: 39.2 % (ref 36.0–46.0)
Hemoglobin: 13 g/dL (ref 12.0–15.0)
Immature Granulocytes: 0 %
Lymphocytes Relative: 28 %
Lymphs Abs: 1.9 10*3/uL (ref 0.7–4.0)
MCH: 30.5 pg (ref 26.0–34.0)
MCHC: 33.2 g/dL (ref 30.0–36.0)
MCV: 92 fL (ref 80.0–100.0)
Monocytes Absolute: 0.6 10*3/uL (ref 0.1–1.0)
Monocytes Relative: 8 %
Neutro Abs: 4.2 10*3/uL (ref 1.7–7.7)
Neutrophils Relative %: 61 %
Platelets: 262 10*3/uL (ref 150–400)
RBC: 4.26 MIL/uL (ref 3.87–5.11)
RDW: 13.1 % (ref 11.5–15.5)
WBC: 7 10*3/uL (ref 4.0–10.5)
nRBC: 0 % (ref 0.0–0.2)

## 2020-12-12 LAB — BASIC METABOLIC PANEL
Anion gap: 6 (ref 5–15)
BUN: 15 mg/dL (ref 8–23)
CO2: 31 mmol/L (ref 22–32)
Calcium: 9 mg/dL (ref 8.9–10.3)
Chloride: 98 mmol/L (ref 98–111)
Creatinine, Ser: 0.68 mg/dL (ref 0.44–1.00)
GFR, Estimated: >60 mL/min (ref >60)
Glucose, Bld: 95 mg/dL (ref 70–99)
Potassium: 3.6 mmol/L (ref 3.5–5.1)
Sodium: 135 mmol/L (ref 135–145)

## 2020-12-12 LAB — PROTIME-INR
INR: 1 (ref 0.8–1.2)
Prothrombin Time: 13.5 seconds (ref 11.4–15.2)

## 2020-12-12 LAB — SURGICAL PCR SCREEN
MRSA, PCR: NEGATIVE
Staphylococcus aureus: NEGATIVE

## 2020-12-12 NOTE — Progress Notes (Signed)
Surgical Instructions    Your procedure is scheduled on 12/23/20.  Report to [facility] Main Entrance "A" at 09:10 A.M., then check in with the Admitting office.  Call this number if you have problems the morning of surgery:  [phone]   If you have any questions prior to your surgery date call [phone]: Open Monday-Friday 8am-4pm    Remember:  Do not eat or drink after midnight the night before your surgery      Take these medicines the morning of surgery with A SIP OF WATER  estradiol (ESTRACE) if needed levothyroxine (SYNTHROID)     As of today, STOP taking any Aspirin (unless otherwise instructed by your surgeon) Aleve, Naproxen, Ibuprofen, Motrin, Advil, Goody's, BC's, all herbal medications, fish oil, and all vitamins.          Do not wear jewelry or makeup Do not wear lotions, powders, perfumes, or deodorant. Do not shave 48 hours prior to surgery.   Do not bring valuables to the hospital.  DO Not wear nail polish, gel polish, artificial nails, or any other type of covering on natural nails  including finger and toenails. If patients have artificial nails, gel coating, etc. that need to be removed by a nail salon please have this removed prior to surgery or surgery may need to be canceled/delayed if the surgeon/ anesthesia feels like the patient is unable to be adequately monitored.             [facility] is not responsible for any belongings or valuables.  Do NOT Smoke (Tobacco/Vaping) or drink Alcohol 24 hours prior to your procedure If you use a CPAP at night, you may bring all equipment for your overnight stay.   Contacts, glasses, dentures or bridgework may not be worn into surgery, please bring cases for these belongings   For patients admitted to the hospital, discharge time will be determined by your treatment team.   Patients discharged the day of surgery will not be allowed to drive home, and someone needs to stay with them for 24 hours.  ONLY 1  SUPPORT PERSON MAY BE PRESENT WHILE YOU ARE IN SURGERY. IF YOU ARE TO BE ADMITTED ONCE YOU ARE IN YOUR ROOM YOU WILL BE ALLOWED TWO (2) VISITORS.  Minor children may have two parents present. Special consideration for safety and communication needs will be reviewed on a case by case basis.  Special instructions:    Oral Hygiene is also important to reduce your risk of infection.  Remember - BRUSH YOUR TEETH THE MORNING OF SURGERY WITH YOUR REGULAR TOOTHPASTE   [place]- Preparing For Surgery  Before surgery, you can play an important role. Because skin is not sterile, your skin needs to be as free of germs as possible. You can reduce the number of germs on your skin by washing with CHG (chlorahexidine gluconate) Soap before surgery.  CHG is an antiseptic cleaner which kills germs and bonds with the skin to continue killing germs even after washing.     Please do not use if you have an allergy to CHG or antibacterial soaps. If your skin becomes reddened/irritated stop using the CHG.  Do not shave (including legs and underarms) for at least 48 hours prior to first CHG shower. It is OK to shave your face.  Please follow these instructions carefully.     Shower the NIGHT BEFORE SURGERY and the MORNING OF SURGERY with CHG Soap.   If you chose to wash your hair, wash  your hair first as usual with your normal shampoo. After you shampoo, rinse your hair and body thoroughly to remove the shampoo.  Then [employer] and genitals (private parts) with your normal soap and rinse thoroughly to remove soap.  After that Use CHG Soap as you would any other liquid soap. You can apply CHG directly to the skin and wash gently with a scrungie or a clean washcloth.   Apply the CHG Soap to your body ONLY FROM THE NECK DOWN.  Do not use on open wounds or open sores. Avoid contact with your eyes, ears, mouth and genitals (private parts). Wash Face and genitals (private parts)  with your normal soap.   Wash  thoroughly, paying special attention to the area where your surgery will be performed.  Thoroughly rinse your body with warm water from the neck down.  DO NOT shower/wash with your normal soap after using and rinsing off the CHG Soap.  Pat yourself dry with a CLEAN TOWEL.  Wear CLEAN PAJAMAS to bed the night before surgery  Place CLEAN SHEETS on your bed the night before your surgery  DO NOT SLEEP WITH PETS.   Day of Surgery: Take a shower with CHG soap. Wear Clean/Comfortable clothing the morning of surgery Do not apply any deodorants/lotions.   Remember to brush your teeth WITH YOUR REGULAR TOOTHPASTE.   Please read over the following fact sheets that you were given.

## 2020-12-12 NOTE — Progress Notes (Signed)
PCP - Dr. Marda Stalker Cardiologist - Dr. Harrington Challenger  EKG - today Stress Test - 2014 ECHO - 2014 Cardiac Cath - 2014  COVID TEST- to be done 4 days prior to surgery. Pt has directions to the Covid testing site   Anesthesia review: No  Patient denies shortness of breath, fever, cough and chest pain at PAT appointment   All instructions explained to the patient, with a verbal understanding of the material. Patient agrees to go over the instructions while at home for a better understanding. Patient also instructed to self quarantine after being tested for COVID-19. The opportunity to ask questions was provided.

## 2020-12-20 ENCOUNTER — Other Ambulatory Visit: Payer: Self-pay | Admitting: Dermatology

## 2020-12-20 LAB — SARS CORONAVIRUS 2 (TAT 6-24 HRS): SARS Coronavirus 2: NEGATIVE

## 2020-12-23 ENCOUNTER — Ambulatory Visit (HOSPITAL_COMMUNITY): Payer: Medicare Other

## 2020-12-23 ENCOUNTER — Ambulatory Visit (HOSPITAL_COMMUNITY): Payer: Medicare Other | Admitting: Certified Registered Nurse Anesthetist

## 2020-12-23 ENCOUNTER — Encounter (HOSPITAL_COMMUNITY): Payer: Self-pay | Admitting: Neurological Surgery

## 2020-12-23 ENCOUNTER — Encounter (HOSPITAL_COMMUNITY)
Admission: AD | Disposition: A | Discharge status: Home / Self Care | Payer: Self-pay | Source: Home / Self Care | Attending: Neurological Surgery

## 2020-12-23 ENCOUNTER — Other Ambulatory Visit: Payer: Self-pay

## 2020-12-23 ENCOUNTER — Inpatient Hospital Stay (HOSPITAL_COMMUNITY)
Admission: AD | Admit: 2020-12-23 | Discharge: 2020-12-25 | DRG: 519 | Disposition: A | Discharge status: Home / Self Care | Payer: Medicare Other | Attending: Neurological Surgery | Admitting: Neurological Surgery

## 2020-12-23 DIAGNOSIS — M4316 Spondylolisthesis, lumbar region: Secondary | ICD-10-CM | POA: Diagnosis present

## 2020-12-23 DIAGNOSIS — E871 Hypo-osmolality and hyponatremia: Secondary | ICD-10-CM | POA: Diagnosis not present

## 2020-12-23 DIAGNOSIS — Z96611 Presence of right artificial shoulder joint: Secondary | ICD-10-CM | POA: Diagnosis present

## 2020-12-23 DIAGNOSIS — Z9841 Cataract extraction status, right eye: Secondary | ICD-10-CM | POA: Diagnosis not present

## 2020-12-23 DIAGNOSIS — Z90722 Acquired absence of ovaries, bilateral: Secondary | ICD-10-CM

## 2020-12-23 DIAGNOSIS — Z96641 Presence of right artificial hip joint: Secondary | ICD-10-CM | POA: Diagnosis present

## 2020-12-23 DIAGNOSIS — I739 Peripheral vascular disease, unspecified: Secondary | ICD-10-CM | POA: Diagnosis not present

## 2020-12-23 DIAGNOSIS — G96198 Other disorders of meninges, not elsewhere classified: Secondary | ICD-10-CM | POA: Diagnosis present

## 2020-12-23 DIAGNOSIS — Z9842 Cataract extraction status, left eye: Secondary | ICD-10-CM | POA: Diagnosis not present

## 2020-12-23 DIAGNOSIS — I6523 Occlusion and stenosis of bilateral carotid arteries: Secondary | ICD-10-CM | POA: Diagnosis present

## 2020-12-23 DIAGNOSIS — Z961 Presence of intraocular lens: Secondary | ICD-10-CM | POA: Diagnosis not present

## 2020-12-23 DIAGNOSIS — Z419 Encounter for procedure for purposes other than remedying health state, unspecified: Secondary | ICD-10-CM

## 2020-12-23 DIAGNOSIS — E559 Vitamin D deficiency, unspecified: Secondary | ICD-10-CM | POA: Diagnosis not present

## 2020-12-23 DIAGNOSIS — I10 Essential (primary) hypertension: Secondary | ICD-10-CM | POA: Diagnosis present

## 2020-12-23 DIAGNOSIS — G9741 Accidental puncture or laceration of dura during a procedure: Secondary | ICD-10-CM | POA: Diagnosis not present

## 2020-12-23 DIAGNOSIS — Z79899 Other long term (current) drug therapy: Secondary | ICD-10-CM | POA: Diagnosis not present

## 2020-12-23 DIAGNOSIS — E785 Hyperlipidemia, unspecified: Secondary | ICD-10-CM | POA: Diagnosis not present

## 2020-12-23 DIAGNOSIS — Z800 Family history of malignant neoplasm of digestive organs: Secondary | ICD-10-CM

## 2020-12-23 DIAGNOSIS — M1990 Unspecified osteoarthritis, unspecified site: Secondary | ICD-10-CM | POA: Diagnosis present

## 2020-12-23 DIAGNOSIS — Y838 Other surgical procedures as the cause of abnormal reaction of the patient, or of later complication, without mention of misadventure at the time of the procedure: Secondary | ICD-10-CM | POA: Diagnosis not present

## 2020-12-23 DIAGNOSIS — Z981 Arthrodesis status: Secondary | ICD-10-CM | POA: Diagnosis not present

## 2020-12-23 DIAGNOSIS — E039 Hypothyroidism, unspecified: Secondary | ICD-10-CM | POA: Diagnosis not present

## 2020-12-23 DIAGNOSIS — Z9889 Other specified postprocedural states: Secondary | ICD-10-CM

## 2020-12-23 DIAGNOSIS — M48061 Spinal stenosis, lumbar region without neurogenic claudication: Secondary | ICD-10-CM | POA: Diagnosis not present

## 2020-12-23 DIAGNOSIS — Z90710 Acquired absence of both cervix and uterus: Secondary | ICD-10-CM

## 2020-12-23 DIAGNOSIS — Z8711 Personal history of peptic ulcer disease: Secondary | ICD-10-CM | POA: Diagnosis not present

## 2020-12-23 HISTORY — PX: LUMBAR LAMINECTOMY/DECOMPRESSION MICRODISCECTOMY: SHX5026

## 2020-12-23 LAB — GLUCOSE, CAPILLARY: Glucose-Capillary: 119 mg/dL — ABNORMAL HIGH (ref 70–99)

## 2020-12-23 SURGERY — LUMBAR LAMINECTOMY/DECOMPRESSION MICRODISCECTOMY 1 LEVEL
Anesthesia: General | Site: Back

## 2020-12-23 MED ORDER — ASCORBIC ACID 500 MG PO TABS
500.0000 mg | ORAL_TABLET | Freq: Every day | ORAL | Status: DC
  Administered 2020-12-24 – 2020-12-25 (×2): 500 mg via ORAL
  Filled 2020-12-23 (×2): qty 1

## 2020-12-23 MED ORDER — LATANOPROST 0.005 % OP SOLN
1.0000 [drp] | Freq: Every day | OPHTHALMIC | Status: DC
  Administered 2020-12-23 – 2020-12-24 (×2): 1 [drp] via OPHTHALMIC
  Filled 2020-12-23: qty 2.5

## 2020-12-23 MED ORDER — LIDOCAINE HCL (CARDIAC) PF 100 MG/5ML IV SOSY
PREFILLED_SYRINGE | INTRAVENOUS | Status: DC | PRN
  Administered 2020-12-23: 60 mg via INTRATRACHEAL

## 2020-12-23 MED ORDER — ONDANSETRON HCL 4 MG/2ML IJ SOLN
INTRAMUSCULAR | Status: DC | PRN
  Administered 2020-12-23: 4 mg via INTRAVENOUS

## 2020-12-23 MED ORDER — FENTANYL CITRATE (PF) 100 MCG/2ML IJ SOLN
25.0000 ug | INTRAMUSCULAR | Status: DC | PRN
  Administered 2020-12-23: 25 ug via INTRAVENOUS

## 2020-12-23 MED ORDER — ONDANSETRON HCL 4 MG PO TABS: 4.0000 mg | ORAL_TABLET | Freq: Four times a day (QID) | ORAL | Status: DC | PRN

## 2020-12-23 MED ORDER — HYDROCODONE-ACETAMINOPHEN 7.5-325 MG PO TABS
1.0000 | ORAL_TABLET | Freq: Four times a day (QID) | ORAL | Status: DC
  Administered 2020-12-23 – 2020-12-25 (×6): 1 via ORAL
  Filled 2020-12-23 (×6): qty 1

## 2020-12-23 MED ORDER — ACETAMINOPHEN 500 MG PO TABS
1000.0000 mg | ORAL_TABLET | ORAL | Status: AC
  Administered 2020-12-23: 1000 mg via ORAL
  Filled 2020-12-23: qty 2

## 2020-12-23 MED ORDER — SENNA 8.6 MG PO TABS
1.0000 | ORAL_TABLET | Freq: Two times a day (BID) | ORAL | Status: DC
  Administered 2020-12-23 – 2020-12-25 (×4): 8.6 mg via ORAL
  Filled 2020-12-23 (×4): qty 1

## 2020-12-23 MED ORDER — APREPITANT 40 MG PO CAPS: 40.0000 mg | ORAL_CAPSULE | Freq: Once | ORAL | Status: DC

## 2020-12-23 MED ORDER — FENTANYL CITRATE (PF) 250 MCG/5ML IJ SOLN
INTRAMUSCULAR | Status: DC | PRN
  Administered 2020-12-23: 100 ug via INTRAVENOUS
  Administered 2020-12-23: 50 ug via INTRAVENOUS

## 2020-12-23 MED ORDER — HEMOSTATIC AGENTS (NO CHARGE) OPTIME
TOPICAL | Status: DC | PRN
  Administered 2020-12-23: 1 via TOPICAL

## 2020-12-23 MED ORDER — CHLORHEXIDINE GLUCONATE CLOTH 2 % EX PADS
6.0000 | MEDICATED_PAD | Freq: Once | CUTANEOUS | Status: DC
  Administered 2020-12-23: 6 via TOPICAL

## 2020-12-23 MED ORDER — 0.9 % SODIUM CHLORIDE (POUR BTL) OPTIME
TOPICAL | Status: DC | PRN
  Administered 2020-12-23: 1000 mL

## 2020-12-23 MED ORDER — FENTANYL CITRATE (PF) 100 MCG/2ML IJ SOLN
INTRAMUSCULAR | Status: AC
  Filled 2020-12-23: qty 2

## 2020-12-23 MED ORDER — MORPHINE SULFATE (PF) 2 MG/ML IV SOLN
2.0000 mg | INTRAVENOUS | Status: DC | PRN
  Administered 2020-12-23: 2 mg via INTRAVENOUS
  Filled 2020-12-23: qty 1

## 2020-12-23 MED ORDER — DEXAMETHASONE SODIUM PHOSPHATE 4 MG/ML IJ SOLN
4.0000 mg | Freq: Four times a day (QID) | INTRAMUSCULAR | Status: DC
  Administered 2020-12-23: 4 mg via INTRAVENOUS
  Filled 2020-12-23: qty 1

## 2020-12-23 MED ORDER — ORAL CARE MOUTH RINSE: 15.0000 mL | Freq: Once | OROMUCOSAL | Status: AC

## 2020-12-23 MED ORDER — CHLORHEXIDINE GLUCONATE 0.12 % MT SOLN
15.0000 mL | Freq: Once | OROMUCOSAL | Status: AC
  Administered 2020-12-23: 15 mL via OROMUCOSAL
  Filled 2020-12-23: qty 15

## 2020-12-23 MED ORDER — PHENOL 1.4 % MT LIQD: 1.0000 | OROMUCOSAL | Status: DC | PRN

## 2020-12-23 MED ORDER — PROPOFOL 10 MG/ML IV BOLUS
INTRAVENOUS | Status: AC
  Filled 2020-12-23: qty 20

## 2020-12-23 MED ORDER — SODIUM CHLORIDE 0.9% FLUSH
3.0000 mL | Freq: Two times a day (BID) | INTRAVENOUS | Status: DC
  Administered 2020-12-23 – 2020-12-25 (×4): 3 mL via INTRAVENOUS

## 2020-12-23 MED ORDER — LOSARTAN POTASSIUM 25 MG PO TABS
25.0000 mg | ORAL_TABLET | Freq: Every day | ORAL | Status: DC
  Administered 2020-12-24 – 2020-12-25 (×2): 25 mg via ORAL
  Filled 2020-12-23 (×2): qty 1

## 2020-12-23 MED ORDER — METHOCARBAMOL 1000 MG/10ML IJ SOLN
500.0000 mg | Freq: Four times a day (QID) | INTRAVENOUS | Status: DC | PRN
  Filled 2020-12-23: qty 5

## 2020-12-23 MED ORDER — DEXAMETHASONE SODIUM PHOSPHATE 10 MG/ML IJ SOLN
INTRAMUSCULAR | Status: AC
  Filled 2020-12-23: qty 1

## 2020-12-23 MED ORDER — MENTHOL 3 MG MT LOZG: 1.0000 | LOZENGE | OROMUCOSAL | Status: DC | PRN

## 2020-12-23 MED ORDER — POTASSIUM CHLORIDE IN NACL 20-0.9 MEQ/L-% IV SOLN
INTRAVENOUS | Status: DC
  Filled 2020-12-23: qty 1000

## 2020-12-23 MED ORDER — DEXAMETHASONE 4 MG PO TABS
4.0000 mg | ORAL_TABLET | Freq: Four times a day (QID) | ORAL | Status: DC
  Administered 2020-12-23 – 2020-12-25 (×6): 4 mg via ORAL
  Filled 2020-12-23 (×6): qty 1

## 2020-12-23 MED ORDER — ONDANSETRON HCL 4 MG/2ML IJ SOLN: 4.0000 mg | Freq: Once | INTRAMUSCULAR | Status: DC | PRN

## 2020-12-23 MED ORDER — BUPIVACAINE HCL (PF) 0.25 % IJ SOLN
INTRAMUSCULAR | Status: AC
  Filled 2020-12-23: qty 30

## 2020-12-23 MED ORDER — SODIUM CHLORIDE 0.9 % IV SOLN
250.0000 mL | INTRAVENOUS | Status: DC
  Administered 2020-12-23: 250 mL via INTRAVENOUS

## 2020-12-23 MED ORDER — ROCURONIUM BROMIDE 10 MG/ML (PF) SYRINGE
PREFILLED_SYRINGE | INTRAVENOUS | Status: DC | PRN
  Administered 2020-12-23: 50 mg via INTRAVENOUS

## 2020-12-23 MED ORDER — OXYCODONE HCL 5 MG/5ML PO SOLN
5.0000 mg | Freq: Once | ORAL | Status: DC | PRN
Start: 2020-12-23 — End: 2020-12-23

## 2020-12-23 MED ORDER — ADULT MULTIVITAMIN W/MINERALS CH
1.0000 | ORAL_TABLET | Freq: Every day | ORAL | Status: DC
  Administered 2020-12-24 – 2020-12-25 (×2): 1 via ORAL
  Filled 2020-12-23 (×2): qty 1

## 2020-12-23 MED ORDER — LIDOCAINE 2% (20 MG/ML) 5 ML SYRINGE
INTRAMUSCULAR | Status: AC
  Filled 2020-12-23: qty 5

## 2020-12-23 MED ORDER — SUGAMMADEX SODIUM 200 MG/2ML IV SOLN
INTRAVENOUS | Status: DC | PRN
  Administered 2020-12-23: 111.6 mg via INTRAVENOUS

## 2020-12-23 MED ORDER — ROCURONIUM BROMIDE 10 MG/ML (PF) SYRINGE
PREFILLED_SYRINGE | INTRAVENOUS | Status: AC
  Filled 2020-12-23: qty 10

## 2020-12-23 MED ORDER — GABAPENTIN 300 MG PO CAPS
300.0000 mg | ORAL_CAPSULE | ORAL | Status: AC
  Administered 2020-12-23: 300 mg via ORAL
  Filled 2020-12-23: qty 1

## 2020-12-23 MED ORDER — ACETAMINOPHEN 325 MG PO TABS: 650.0000 mg | ORAL_TABLET | ORAL | Status: DC | PRN

## 2020-12-23 MED ORDER — CEFAZOLIN SODIUM-DEXTROSE 2-4 GM/100ML-% IV SOLN
2.0000 g | Freq: Three times a day (TID) | INTRAVENOUS | Status: AC
  Administered 2020-12-23 – 2020-12-24 (×2): 2 g via INTRAVENOUS
  Filled 2020-12-23 (×2): qty 100

## 2020-12-23 MED ORDER — ONDANSETRON HCL 4 MG/2ML IJ SOLN
INTRAMUSCULAR | Status: AC
  Filled 2020-12-23: qty 2

## 2020-12-23 MED ORDER — ESTROGENS, CONJUGATED 0.625 MG/GM VA CREA
1.0000 | TOPICAL_CREAM | Freq: Every day | VAGINAL | Status: DC
  Filled 2020-12-23: qty 30

## 2020-12-23 MED ORDER — LEVOTHYROXINE SODIUM 125 MCG PO TABS
125.0000 ug | ORAL_TABLET | Freq: Every day | ORAL | Status: DC
  Administered 2020-12-24 – 2020-12-25 (×2): 125 ug via ORAL
  Filled 2020-12-23 (×4): qty 1

## 2020-12-23 MED ORDER — FENTANYL CITRATE (PF) 250 MCG/5ML IJ SOLN
INTRAMUSCULAR | Status: AC
  Filled 2020-12-23: qty 5

## 2020-12-23 MED ORDER — THROMBIN 5000 UNITS EX SOLR
CUTANEOUS | Status: AC
  Filled 2020-12-23: qty 5000

## 2020-12-23 MED ORDER — CELECOXIB 200 MG PO CAPS
200.0000 mg | ORAL_CAPSULE | Freq: Two times a day (BID) | ORAL | Status: DC
  Administered 2020-12-23 – 2020-12-25 (×4): 200 mg via ORAL
  Filled 2020-12-23 (×4): qty 1

## 2020-12-23 MED ORDER — PROPOFOL 10 MG/ML IV BOLUS
INTRAVENOUS | Status: DC | PRN
  Administered 2020-12-23: 50 ug/kg/min via INTRAVENOUS
  Administered 2020-12-23: 110 mg via INTRAVENOUS

## 2020-12-23 MED ORDER — HYDROCHLOROTHIAZIDE 25 MG PO TABS
12.5000 mg | ORAL_TABLET | Freq: Every day | ORAL | Status: DC
  Administered 2020-12-24 – 2020-12-25 (×2): 12.5 mg via ORAL
  Filled 2020-12-23 (×2): qty 1

## 2020-12-23 MED ORDER — THROMBIN 5000 UNITS EX SOLR
OROMUCOSAL | Status: DC | PRN
  Administered 2020-12-23: 5 mL via TOPICAL

## 2020-12-23 MED ORDER — OXYCODONE HCL 5 MG PO TABS: 5.0000 mg | ORAL_TABLET | Freq: Once | ORAL | Status: DC | PRN

## 2020-12-23 MED ORDER — CEFAZOLIN SODIUM-DEXTROSE 2-4 GM/100ML-% IV SOLN
2.0000 g | INTRAVENOUS | Status: AC
  Administered 2020-12-23: 2 g via INTRAVENOUS
  Filled 2020-12-23: qty 100

## 2020-12-23 MED ORDER — ONDANSETRON HCL 4 MG/2ML IJ SOLN: 4.0000 mg | Freq: Four times a day (QID) | INTRAMUSCULAR | Status: DC | PRN

## 2020-12-23 MED ORDER — DEXAMETHASONE SODIUM PHOSPHATE 10 MG/ML IJ SOLN
10.0000 mg | Freq: Once | INTRAMUSCULAR | Status: AC
  Administered 2020-12-23: 10 mg via INTRAVENOUS
  Filled 2020-12-23: qty 1

## 2020-12-23 MED ORDER — SODIUM CHLORIDE 0.9% FLUSH: 3.0000 mL | INTRAVENOUS | Status: DC | PRN

## 2020-12-23 MED ORDER — METHOCARBAMOL 500 MG PO TABS: 500.0000 mg | ORAL_TABLET | Freq: Four times a day (QID) | ORAL | Status: DC | PRN

## 2020-12-23 MED ORDER — LACTATED RINGERS IV SOLN: INTRAVENOUS | Status: DC

## 2020-12-23 MED ORDER — BUPIVACAINE HCL (PF) 0.25 % IJ SOLN
INTRAMUSCULAR | Status: DC | PRN
  Administered 2020-12-23: 5 mL

## 2020-12-23 MED ORDER — ACETAMINOPHEN 650 MG RE SUPP: 650.0000 mg | RECTAL | Status: DC | PRN

## 2020-12-23 MED ORDER — PHENYLEPHRINE HCL-NACL 20-0.9 MG/250ML-% IV SOLN
INTRAVENOUS | Status: DC | PRN
  Administered 2020-12-23: 25 ug/min via INTRAVENOUS

## 2020-12-23 SURGICAL SUPPLY — 43 items
BAG COUNTER SPONGE SURGICOUNT (BAG) ×2 IMPLANT
BAND RUBBER #18 3X1/16 STRL (MISCELLANEOUS) ×4 IMPLANT
BENZOIN TINCTURE PRP APPL 2/3 (GAUZE/BANDAGES/DRESSINGS) ×2 IMPLANT
BUR CARBIDE MATCH 3.0 (BURR) ×2 IMPLANT
CANISTER SUCT 3000ML PPV (MISCELLANEOUS) ×2 IMPLANT
DERMABOND ADVANCED (GAUZE/BANDAGES/DRESSINGS) ×1
DERMABOND ADVANCED .7 DNX12 (GAUZE/BANDAGES/DRESSINGS) ×1 IMPLANT
DRAPE LAPAROTOMY 100X72X124 (DRAPES) ×2 IMPLANT
DRAPE MICROSCOPE LEICA (MISCELLANEOUS) ×2 IMPLANT
DRAPE SURG 17X23 STRL (DRAPES) ×2 IMPLANT
DRSG OPSITE POSTOP 4X6 (GAUZE/BANDAGES/DRESSINGS) ×2 IMPLANT
DURAPREP 26ML APPLICATOR (WOUND CARE) ×2 IMPLANT
ELECT REM PT RETURN 9FT ADLT (ELECTROSURGICAL) ×2
ELECTRODE REM PT RTRN 9FT ADLT (ELECTROSURGICAL) ×1 IMPLANT
GAUZE 4X4 16PLY ~~LOC~~+RFID DBL (SPONGE) ×2 IMPLANT
GLOVE SURG ENC MOIS LTX SZ7 (GLOVE) ×8 IMPLANT
GLOVE SURG ENC MOIS LTX SZ8 (GLOVE) ×2 IMPLANT
GLOVE SURG UNDER POLY LF SZ7 (GLOVE) ×6 IMPLANT
GOWN STRL REUS W/ TWL LRG LVL3 (GOWN DISPOSABLE) ×2 IMPLANT
GOWN STRL REUS W/ TWL XL LVL3 (GOWN DISPOSABLE) ×1 IMPLANT
GOWN STRL REUS W/TWL 2XL LVL3 (GOWN DISPOSABLE) ×2 IMPLANT
GOWN STRL REUS W/TWL LRG LVL3 (GOWN DISPOSABLE) ×2
GOWN STRL REUS W/TWL XL LVL3 (GOWN DISPOSABLE) ×1
GRAFT DURAGEN MATRIX 1WX1L (Tissue) ×2 IMPLANT
HEMOSTAT POWDER KIT SURGIFOAM (HEMOSTASIS) ×2 IMPLANT
KIT BASIN OR (CUSTOM PROCEDURE TRAY) ×2 IMPLANT
KIT TURNOVER KIT B (KITS) ×2 IMPLANT
NEEDLE HYPO 25X1 1.5 SAFETY (NEEDLE) ×2 IMPLANT
NEEDLE SPNL 20GX3.5 QUINCKE YW (NEEDLE) ×2 IMPLANT
NS IRRIG 1000ML POUR BTL (IV SOLUTION) ×2 IMPLANT
PACK LAMINECTOMY NEURO (CUSTOM PROCEDURE TRAY) ×2 IMPLANT
PAD ARMBOARD 7.5X6 YLW CONV (MISCELLANEOUS) ×4 IMPLANT
PATTIES SURGICAL .5 X.5 (GAUZE/BANDAGES/DRESSINGS) ×2 IMPLANT
SEALANT ADHERUS EXTEND TIP (MISCELLANEOUS) ×2 IMPLANT
STRIP CLOSURE SKIN 1/2X4 (GAUZE/BANDAGES/DRESSINGS) ×2 IMPLANT
SUT PROLENE 6 0 BV (SUTURE) ×4 IMPLANT
SUT VIC AB 0 CT1 18XCR BRD8 (SUTURE) ×1 IMPLANT
SUT VIC AB 0 CT1 8-18 (SUTURE) ×1
SUT VIC AB 2-0 CP2 18 (SUTURE) ×2 IMPLANT
SUT VIC AB 3-0 SH 8-18 (SUTURE) ×4 IMPLANT
TOWEL GREEN STERILE (TOWEL DISPOSABLE) ×2 IMPLANT
TOWEL GREEN STERILE FF (TOWEL DISPOSABLE) ×2 IMPLANT
WATER STERILE IRR 1000ML POUR (IV SOLUTION) ×2 IMPLANT

## 2020-12-23 NOTE — Transfer of Care (Signed)
Immediate Anesthesia Transfer of Care Note  Patient: Carol Wells  Procedure(s) Performed: Laminectomy and Foraminotomy - Lumbar one-two (Back)  Patient Location: PACU  Anesthesia Type:General  Level of Consciousness: awake, alert  and oriented  Airway & Oxygen Therapy: Patient Spontanous Breathing and Patient connected to nasal cannula oxygen  Post-op Assessment: Report given to RN and Post -op Vital signs reviewed and stable  Post vital signs: Reviewed and stable  Last Vitals:  Vitals Value Taken Time  BP 162/79 12/23/20 1359  Temp    Pulse 71 12/23/20 1402  Resp 12 12/23/20 1402  SpO2 100 % 12/23/20 1402  Vitals shown include unvalidated device data.  Last Pain:  Vitals:   12/23/20 0938  TempSrc:   PainSc: 0-No pain         Complications: No notable events documented.

## 2020-12-23 NOTE — Anesthesia Procedure Notes (Signed)
Procedure Name: Intubation Date/Time: 12/23/2020 11:22 AM Performed by: Minerva Ends, CRNA Pre-anesthesia Checklist: Patient identified, Emergency Drugs available, Suction available and Patient being monitored Patient Re-evaluated:Patient Re-evaluated prior to induction Oxygen Delivery Method: Circle system utilized Preoxygenation: Pre-oxygenation with 100% oxygen Induction Type: IV induction Ventilation: Mask ventilation without difficulty Laryngoscope Size: Mac and 3 Grade View: Grade I Tube type: Oral Tube size: 7.0 mm Number of attempts: 1 Airway Equipment and Method: Stylet and Oral airway Placement Confirmation: ETT inserted through vocal cords under direct vision, positive ETCO2 and breath sounds checked- equal and bilateral Secured at: 21 cm Tube secured with: Tape Dental Injury: Teeth and Oropharynx as per pre-operative assessment

## 2020-12-23 NOTE — Anesthesia Postprocedure Evaluation (Signed)
Anesthesia Post Note  Patient: TATIA MOLDENHAUER  Procedure(s) Performed: Laminectomy and Foraminotomy - Lumbar one-two (Back)     Patient location during evaluation: PACU Anesthesia Type: General Level of consciousness: awake and alert Pain management: pain level controlled Vital Signs Assessment: post-procedure vital signs reviewed and stable Respiratory status: spontaneous breathing, nonlabored ventilation and respiratory function stable Cardiovascular status: stable and blood pressure returned to baseline Anesthetic complications: no   No notable events documented.  Last Vitals:  Vitals:   12/23/20 1500 12/23/20 1530  BP: (!) 164/72 (!) 149/77  Pulse: 61 (!) 58  Resp: 15 12  Temp:    SpO2: 99% 96%    Last Pain:  Vitals:   12/23/20 1530  TempSrc:   PainSc: 0-No pain                 Audry Pili

## 2020-12-23 NOTE — H&P (Signed)
Subjective: Patient is a 87 y.o. female admitted for lumbar stenosis. Onset of symptoms was several months ago, gradually improving since that time.  The pain is rated severe, and is located at the across the lower back and radiates to L>R leg. The pain is described as aching and occurs all day. The symptoms have been progressive. Symptoms are exacerbated by exercise and standing. MRI or CT showed stenosis L2-3   Past Medical History:  Diagnosis Date   Anemia 11/2015   Arthritis    Blood transfusion 2017 JULY 22   Carotid stenosis    Dopplers [id number]: RICA [id number], LICA [id number]   Cataract    REMOVED   Chronic headaches    HX MIGRAINES   Collagenous colitis    Diverticulosis 2013   Colonoscopy   Gastric ulcer 11/2015   Glaucoma    BOTH EYES   Hiatal hernia    History of spinal stenosis    History of uterine fibroid    Hx of cardiac catheterization    LHC 3/14: no angiographic CAD, EF 65%   Hx of echocardiogram    Echocardiogram 06/21/12: Mild LVH, EF [id number], grade 2 diastolic dysfunction, mild MR   Hyperlipidemia    Hypertension    Hypothyroidism    Internal hemorrhoids 2009   Colonoscopy   Migraines    UTI (urinary tract infection) HX OF    Past Surgical History:  Procedure Laterality Date   BUNIONECTOMY  YRS AGO LEFT   RIGHT [facility] 3 YRS AGO   BUNIONECTOMY Left    CATARACT EXTRACTION W/ INTRAOCULAR LENS IMPLANT  03/2010   Left   CATARACT EXTRACTION W/ INTRAOCULAR LENS IMPLANT Bilateral 11/2010   COLONOSCOPY  2013   NORMAL    ESOPHAGOGASTRODUODENOSCOPY N/A 11/23/2015   Procedure: ESOPHAGOGASTRODUODENOSCOPY (EGD);  Surgeon: Rogene Houston, MD;  [facility];  Service: Endoscopy;  Laterality: N/A;   EUS N/A 12/19/2015   Procedure: ESOPHAGEAL ENDOSCOPIC ULTRASOUND (EUS) RADIAL;  Surgeon: Milus Banister, MD;  Location: [facility];  Service: Endoscopy;  Laterality: N/A;   FUNCTIONAL ENDOSCOPIC SINUS SURGERY     HAMMER TOE SURGERY     HAND SURGERY Right 1998    INGUINAL HERNIA REPAIR Left 03/11/2016   Procedure: LEFT INGUINAL HERINA REPAIR WITH MESH;  Surgeon: Donnie Mesa, MD;  Location: [place];  Service: General;  Laterality: Left;   INSERTION OF MESH Left 03/11/2016   Procedure: INSERTION OF MESH;  Surgeon: Donnie Mesa, MD;  Location: [place];  Service: General;  Laterality: Left;   IR RADIOLOGIST EVAL & MGMT  07/01/2017   LAMINECTOMY  03/2008   LAPAROTOMY N/A 11/11/2017   Procedure: MINI LAPAROTOMY;  Surgeon: Everitt Amber, MD;  Location: [facility];  Service: Gynecology;  Laterality: N/A;   POLYPECTOMY     REVERSE SHOULDER ARTHROPLASTY Right 10/13/2018   Procedure: REVERSE SHOULDER ARTHROPLASTY;  Surgeon: Justice Britain, MD;  Location: [facility];  Service: Orthopedics;  Laterality: Right;  176mn   ROBOTIC ASSISTED TOTAL HYSTERECTOMY WITH BILATERAL SALPINGO OOPHERECTOMY Bilateral 11/11/2017   Procedure: XI ROBOTIC ASSISTED TOTAL HYSTERECTOMY WITH BILATERAL SALPINGO OOPHORECTOMY FOR UTERUS GREATER THAN 250 GRAMS;  Surgeon: REveritt Amber MD;  Location: [facility];  Service: Gynecology;  Laterality: Bilateral;   TONSILLECTOMY     TOTAL HIP ARTHROPLASTY  2008   Right    Prior to Admission medications   Medication Sig Start Date End Date Taking? Authorizing Provider  atorvastatin (LIPITOR) 10 MG tablet Take 0.5 tablets (5 mg total) by  mouth at bedtime. 04/05/20  Yes Virgie Dad, MD  Cholecalciferol (VITAMIN D3 SUPER STRENGTH) 50 MCG (2000 UT) TABS Take 2,000 Units by mouth daily.   Yes [provider]  clobetasol ointment (TEMOVATE) [id number] % Apply 1 application topically daily as needed (vaginitis).   Yes [provider]  conjugated estrogens (PREMARIN) vaginal cream Place 1 Applicatorful vaginally daily.   Yes [provider]  estradiol (ESTRACE) 0.5 MG tablet Take 1 tablet (0.5 mg total) by mouth at bedtime. Patient taking differently: Take 0.5 mg by mouth daily as needed (vaginitis). 08/23/20  Yes Mast, Man X, NP  hydrochlorothiazide  (HYDRODIURIL) 12.5 MG tablet Take one tablet by mouth once daily. 10/15/20  Yes Mast, Man X, NP  latanoprost (XALATAN) 0.005 % ophthalmic solution Place 1 drop into both eyes at bedtime.   Yes [provider]  levothyroxine (SYNTHROID) 125 MCG tablet Take 1 tablet (125 mcg total) by mouth daily. 10/01/20  Yes Mast, Man X, NP  losartan (COZAAR) 100 MG tablet Take one tablet by mouth once daily. 09/24/20  Yes Mast, Man X, NP  Multiple Vitamin (MULTIVITAMIN) tablet Take 1 tablet by mouth daily with lunch.   Yes [provider]  Turmeric (QC TUMERIC COMPLEX PO) Take 1,500 Units by mouth 2 (two) times daily.   Yes [provider]  vitamin C (ASCORBIC ACID) 500 MG tablet Take 500 mg by mouth daily with lunch.   Yes [provider]   No Known Allergies  Social History   Tobacco Use   Smoking status: Never   Smokeless tobacco: Never  Substance Use Topics   Alcohol use: Yes    Comment: 1 glass at dinner    Family History  Problem Relation Age of Onset   Colon cancer Brother 36   Anuerysm Father 42     Review of Systems  Positive ROS: neg  All other systems have been reviewed and were otherwise negative with the exception of those mentioned in the HPI and as above.  Objective: Vital signs in last 24 hours: Temp:  [97.5 F (36.4 C)] 97.5 F (36.4 C) (08/22 0927) Pulse Rate:  [72] 72 (08/22 0927) Resp:  [118] 118 (08/22 0927) BP: (160)/(80) 160/80 (08/22 0927) SpO2:  [97 %] 97 % (08/22 0927) Weight:  [55.8 kg] 55.8 kg (08/22 0927)  General Appearance: Alert, cooperative, no distress, appears stated age Head: Normocephalic, without obvious abnormality, atraumatic Eyes: PERRL, conjunctiva/corneas clear, EOM's intact    Neck: Supple, symmetrical, trachea midline Back: Symmetric, no curvature, ROM normal, no CVA tenderness Lungs:  respirations unlabored Heart: Regular rate and rhythm Abdomen: Soft, non-tender Extremities: Extremities normal,  atraumatic, no cyanosis or edema Pulses: 2+ and symmetric all extremities Skin: Skin color, texture, turgor normal, no rashes or lesions  NEUROLOGIC:   Mental status: Alert and oriented x4,  no aphasia, good attention span, fund of knowledge, and memory Motor Exam - grossly normal Sensory Exam - grossly normal Reflexes: trace Coordination - grossly normal Gait - grossly normal Balance - grossly normal Cranial Nerves: I: smell Not tested  II: visual acuity  OS: nl    OD: nl  II: visual fields Full to confrontation  II: pupils Equal, round, reactive to light  III,VII: ptosis None  III,IV,VI: extraocular muscles  Full ROM  V: mastication Normal  V: facial light touch sensation  Normal  V,VII: corneal reflex  Present  VII: facial muscle function - upper  Normal  VII: facial muscle function - lower Normal  VIII: hearing Not tested  IX: soft palate elevation  Normal  IX,X: gag reflex Present  XI: trapezius strength  5/5  XI: sternocleidomastoid strength 5/5  XI: neck flexion strength  5/5  XII: tongue strength  Normal    Data Review Lab Results  Component Value Date   WBC 7.0 12/12/2020   HGB 13.0 12/12/2020   HCT 39.2 12/12/2020   MCV 92.0 12/12/2020   PLT 262 12/12/2020   Lab Results  Component Value Date   NA 135 12/12/2020   K 3.6 12/12/2020   CL 98 12/12/2020   CO2 31 12/12/2020   BUN 15 12/12/2020   CREATININE 0.68 12/12/2020   GLUCOSE 95 12/12/2020   Lab Results  Component Value Date   INR 1.0 12/12/2020    Assessment/Plan:  Estimated body mass index is 23.24 kg/m as calculated from the following:   Height as of this encounter: '5\' 1"'$  (1.549 m).   Weight as of this encounter: 55.8 kg. Patient admitted for stenosis L2-3, previous surgery L3-5 with post-lami spondy. PLAN: L2-3 decompressive laminectomy. Patient has failed a reasonable attempt at conservative therapy.  I explained the condition and procedure to the patient and answered any questions.   Patient wishes to proceed with procedure as planned. Understands risks/ benefits and typical outcomes of procedure.   Eustace Moore 12/23/2020 10:58 AM

## 2020-12-23 NOTE — Anesthesia Preprocedure Evaluation (Addendum)
Anesthesia Evaluation  Patient identified by MRN, date of birth, ID band Patient awake    Reviewed: Allergy & Precautions, NPO status , Patient's Chart, lab work & pertinent test results  History of Anesthesia Complications Negative for: history of anesthetic complications  Airway Mallampati: II  TM Distance: >3 FB Neck ROM: Full    Dental  (+) Dental Advisory Given, Teeth Intact   Pulmonary neg pulmonary ROS,    Pulmonary exam normal        Cardiovascular hypertension, Pt. on medications + Peripheral Vascular Disease  Normal cardiovascular exam     Neuro/Psych  Headaches, negative psych ROS   GI/Hepatic Neg liver ROS, hiatal hernia, PUD,   Endo/Other  Hypothyroidism   Renal/GU negative Renal ROS     Musculoskeletal  (+) Arthritis ,   Abdominal   Peds  Hematology negative hematology ROS (+)   Anesthesia Other Findings Covid test negative   Reproductive/Obstetrics                            Anesthesia Physical Anesthesia Plan  ASA: 2  Anesthesia Plan: General   Post-op Pain Management:    Induction: Intravenous  PONV Risk Score and Plan: 4 or greater and Treatment may vary due to age or medical condition, Ondansetron, Aprepitant and Propofol infusion  Airway Management Planned: Oral ETT  Additional Equipment: None  Intra-op Plan:   Post-operative Plan: Extubation in OR  Informed Consent: I have reviewed the patients History and Physical, chart, labs and discussed the procedure including the risks, benefits and alternatives for the proposed anesthesia with the patient or authorized representative who has indicated his/her understanding and acceptance.     Dental advisory given  Plan Discussed with: CRNA and Anesthesiologist  Anesthesia Plan Comments:        Anesthesia Quick Evaluation

## 2020-12-23 NOTE — Op Note (Signed)
12/23/2020  1:48 PM  PATIENT:  Carol Wells  87 y.o. female  PRE-OPERATIVE DIAGNOSIS: Spinal stenosis L2-3 with back and leg pain  POST-OPERATIVE DIAGNOSIS:  same  PROCEDURE: Decompressive lumbar laminectomy, medial facetectomy foraminotomies L2-3  SURGEON:  Sherley Bounds, MD  ASSISTANTS: Glenford Peers FNP  ANESTHESIA:   General  EBL: 25 ml  Total I/O In: 100 [IV Piggyback:100] Out: 200 [Blood:200]  BLOOD ADMINISTERED: none  DRAINS: None  SPECIMEN:  none  INDICATION FOR PROCEDURE: This patient presented with back and leg pain left greater than right. Imaging showed spinal stenosis L2-3 , previous laminectomy at L3-4 and L4-5 with postlaminectomy spondylolisthesis at those levels. The patient tried conservative measures without relief. Pain was debilitating. Recommended decompressive laminectomy L2-3 to address the spinal stenosis. Patient understood the risks, benefits, and alternatives and potential outcomes and wished to proceed.  PROCEDURE DETAILS: The patient was taken to the operating room and after induction of adequate generalized endotracheal anesthesia, the patient was rolled into the prone position on the Wilson frame and all pressure points were padded. The lumbar region was cleaned and then prepped with DuraPrep and draped in the usual sterile fashion. 5 cc of local anesthesia was injected and then a dorsal midline incision was made and carried down to the lumbo sacral fascia. The fascia was opened and the paraspinous musculature was taken down in a subperiosteal fashion to expose L2-3 bilaterally. Intraoperative x-ray confirmed my level, and then I used a combination of the high-speed drill and the Kerrison punches to perform a hemilaminectomy, medial facetectomy, and foraminotomy at L2-3 on the left. The underlying yellow ligament was opened and removed in a piecemeal fashion to expose the underlying dura and exiting nerve root.  The dura was quite adherent to the  superior part of the lamina there was a residual at L3.  Unfortunately, there was an unintended durotomy on the left that extended superiorly. I undercut the lateral recess and dissected down until I was medial to and distal to the pedicle.  I found the lateral edge of the dura.  I removed the spinous process and drilled the lamina and the central canal and right lateral recess and decompress the right lateral recess.  I removed epidural fibrosis from the surface of the dura adjacent to the repair site.  The nerve root was well decompressed. I then palpated with a coronary dilator along the nerve root and into the foramen to assure adequate decompression. I felt no more compression of the nerve root.  We brought in the operating microscope for the dural repair.  I found the edges and used a 6-0 Prolene and we closed durotomy with a running 6-0 Prolene.  At the very end we tied down a small piece of muscle over the distal part of the repair site.  The repair looks good.  It appeared to be watertight.  We performed a Valsalva maneuver twice and did not find leakage of CSF.  I irrigated with saline solution containing bacitracin. Achieved hemostasis with bipolar cautery, lined the dura with DuraGen and Tisseel fibrin glue, and then closed the fascia with 0 Vicryl. I closed the subcutaneous tissues with 2-0 Vicryl and the subcuticular tissues with 3-0 Vicryl. The skin was then closed with a bond, benzoin and Steri-Strips. The drapes were removed, a sterile dressing was applied.  My nurse practitioner was involved in the exposure, safe retraction of the neural elements, the durotomy repair and the closure. the patient was awakened from general anesthesia  and transferred to the recovery room in stable condition. At the end of the procedure all sponge, needle and instrument counts were correct.    PLAN OF CARE: Admit to inpatient   PATIENT DISPOSITION:  PACU - hemodynamically stable.   Delay start of  Pharmacological VTE agent (>24hrs) due to surgical blood loss or risk of bleeding:  yes

## 2020-12-24 ENCOUNTER — Encounter (HOSPITAL_COMMUNITY): Payer: Self-pay | Admitting: Neurological Surgery

## 2020-12-24 NOTE — Plan of Care (Signed)

## 2020-12-24 NOTE — Progress Notes (Signed)
She seems to be doing very well.  She has no headache.  No back pain or leg pain or numbness tingling or weakness.  Her dressing is dry and flat.  She has good strength in her lower extremities to in bed exam.  She is eating well.  Plan to potentially set her up to eat later today.

## 2020-12-25 MED ORDER — HYDROCODONE-ACETAMINOPHEN 7.5-325 MG PO TABS: 1.0000 | ORAL_TABLET | Freq: Four times a day (QID) | ORAL | 0 refills | Status: DC

## 2020-12-25 NOTE — Plan of Care (Signed)

## 2020-12-25 NOTE — Evaluation (Addendum)
Occupational Therapy Evaluation Patient Details Name: Carol Wells MRN: [id number] DOB: [birth year] Today's Date: 12/25/2020    History of Present Illness Patient is a 87 y.o. female admitted for lumbar stenosis. s/p Decompressive lumbar laminectomy, medial facetectomy foraminotomies L2-3 8/23 with Eustace Moore, MD. PMH includes carotid stenosis, glaucoma, hx of spinal stenosis, cardiac catheterization, hyperlipidemia, HTN, hypothyroidism, R total hip arthroplasty, R reverse shoulder arthroplasty previous laminectomy at L3-4 and L4-5.   Clinical Impression   PTA, pt was independent with ADL/IADL and functional mobility without AD. Pt and her husband live in an independent living community in a 1 level duplex and no stairs to address in the home.   Pt received sitting EOB eating lunch. Pt verbalized 3/3 back precautions and required supervision with functional mobility at RW level. Pt initially requesting 1 person hand held assistance for increased stability due to her "sea legs". Provided pt with RW and she reports feeling more stable and safe. Pt has RW at home. Pt required minguard for LB dressing for cues to minimize bending, educated pt on use of reacher for improved independence. Feel pt is appropriate to d/c home with her husband.   Pt will benefit from PT evaluation. Patient evaluated by Occupational Therapy with no further acute OT needs identified. All education has been completed and the patient has no further questions. See below for any follow-up Occupational Therapy or equipment needs. OT to sign off. Thank you for referral.      Follow Up Recommendations  No OT follow up    Equipment Recommendations  None recommended by OT    Recommendations for Other Services       Precautions / Restrictions Precautions Precautions: Back Precaution Comments: no brace ordered      Mobility Bed Mobility Overal bed mobility: Modified Independent             General bed mobility  comments: with log roll    Transfers Overall transfer level: Needs assistance   Transfers: Sit to/from Stand Sit to Stand: Supervision         General transfer comment: supervision for safety    Balance Overall balance assessment: Needs assistance Sitting-balance support: No upper extremity supported;Feet supported Sitting balance-Leahy Scale: Good     Standing balance support: Single extremity supported Standing balance-Leahy Scale: Fair Standing balance comment: pt demonstrates preference for at least single UE support in standing, pt reports having "sea legs"                           ADL either performed or assessed with clinical judgement   ADL Overall ADL's : Needs assistance/impaired Eating/Feeding: Independent   Grooming: Supervision/safety;Standing   Upper Body Bathing: Independent   Lower Body Bathing: Supervison/ safety;Sit to/from stand   Upper Body Dressing : Independent   Lower Body Dressing: Min guard;Sit to/from stand;Cueing for back precautions;Cueing for compensatory techniques Lower Body Dressing Details (indicate cue type and reason): educated pt on use of reacher for LB dressing, educated pt on available AE (sock aid/long handle sponge) pt able to figure-4 LLE, difficulty with RLE figure-4 educated pt on compensatory dressing strategies and use of AE.              Functional mobility during ADLs: Min guard;Supervision/safety;Rolling walker General ADL Comments: minguard without AD, pt requesting hand held support, supervision with use of RW     Vision         Perception  Praxis      Pertinent Vitals/Pain Pain Assessment: No/denies pain     Hand Dominance Right   Extremity/Trunk Assessment Upper Extremity Assessment Upper Extremity Assessment: Overall WFL for tasks assessed   Lower Extremity Assessment Lower Extremity Assessment: Generalized weakness;RLE deficits/detail RLE Deficits / Details: demonstrates slight  weakness in Rhip flexors 4/5 RLE Sensation: WNL RLE Coordination: WNL   Cervical / Trunk Assessment Cervical / Trunk Assessment: Normal   Communication Communication Communication: No difficulties   Cognition Arousal/Alertness: Awake/alert Behavior During Therapy: WFL for tasks assessed/performed Overall Cognitive Status: Within Functional Limits for tasks assessed                                 General Comments: good safety awareness   General Comments  vss    Exercises     Shoulder Instructions      Home Living Family/patient expects to be discharged to:: Private residence Living Arrangements: Spouse/significant other Available Help at Discharge: Family;Available 24 hours/day Type of Home: House (duplex) Home Access: Level entry     Home Layout: One level     Bathroom Shower/Tub: Occupational psychologist: Standard Bathroom Accessibility: Yes How Accessible: Accessible via wheelchair Home Equipment: Shower seat - built in;Toilet riser;Walker - 2 wheels;Walker - 4 wheels;Grab bars - tub/shower   Additional Comments: pt's son is visiting from [place]; Pt lives at [facility]      Prior Functioning/Environment Level of Independence: Independent        Comments: no AD, enjoys gardening        OT Problem List: Impaired balance (sitting and/or standing);Decreased activity tolerance;Decreased knowledge of precautions      OT Treatment/Interventions:      OT Goals(Current goals can be found in the care plan section) Acute Rehab OT Goals Patient Stated Goal: to go home today OT Goal Formulation: With patient Time For Goal Achievement: 01/08/21 Potential to Achieve Goals: Good  OT Frequency:     Barriers to D/C:            Co-evaluation              AM-PAC OT "6 Clicks" Daily Activity     Outcome Measure Help from another person eating meals?: None Help from another person taking care of personal grooming?: A  Little Help from another person toileting, which includes using toliet, bedpan, or urinal?: A Little Help from another person bathing (including washing, rinsing, drying)?: A Little Help from another person to put on and taking off regular upper body clothing?: None Help from another person to put on and taking off regular lower body clothing?: A Little 6 Click Score: 20   End of Session Equipment Utilized During Treatment: Rolling walker Nurse Communication: Mobility status  Activity Tolerance: Patient tolerated treatment well Patient left: in bed;with call bell/phone within reach;with bed alarm set  OT Visit Diagnosis: Other abnormalities of gait and mobility (R26.89)                Time: [id number] OT Time Calculation (min): 33 min Charges:  OT General Charges $OT Visit: 1 Visit OT Evaluation $OT Eval Moderate Complexity: 1 Mod OT Treatments $Self Care/Home Management : 8-22 mins  Helene Kelp OTR/L Acute Rehabilitation Services Office: [phone]   Wyn Forster 12/25/2020, 1:15 PM

## 2020-12-25 NOTE — Progress Notes (Signed)
She looks great, no pain, no Headache, I walked with her and she did fairly well, dressing CDI. Will order PT/OT, likely home later today if does well

## 2020-12-25 NOTE — Discharge Summary (Signed)
Physician Discharge Summary  Patient ID: Carol Wells MRN: [id number] DOB/AGE: [birth year] 87 y.o.  Admit date: 12/23/2020 Discharge date: 12/25/2020  Admission Diagnoses: lumbar stenosis   Discharge Diagnoses: same   Discharged Condition: stable, good  Hospital Course: The patient was admitted on 12/23/2020 and taken to the operating room where the patient underwent LL L2-3. Had an unintended durotomy. Fklat for 24 hrs. The patient tolerated the procedure well and was taken to the recovery room and then to the floor in stable condition. The hospital course was routine. There were no complications. The wound remained clean dry and intact. Pt had appropriate back soreness. No complaints of leg pain or new N/T/W. The patient remained afebrile with stable vital signs, and tolerated a regular diet. The patient continued to increase activities, and pain was well controlled with oral pain medications.   Consults: None  Significant Diagnostic Studies:  Results for orders placed or performed during the hospital encounter of 12/23/20  Glucose, capillary  Result Value Ref Range   Glucose-Capillary 119 (H) 70 - 99 mg/dL   Comment 1 Notify RN    Comment 2 Document in Chart     DG Lumbar Spine 1 View  Result Date: 12/23/2020 CLINICAL DATA:  Localization for L1-2 laminectomy EXAM: LUMBAR SPINE - 1 VIEW COMPARISON:  08/29/2019 FINDINGS: Single cross-table lateral view of the lumbar spine demonstrates posterior surgical instruments directed at the L3 pedicles. IMPRESSION: Intraoperative localization as above. Electronically Signed   By: Rolm Baptise M.D.   On: 12/23/2020 16:16    Antibiotics:  Anti-infectives (From admission, onward)    Start     Dose/Rate Route Frequency Ordered Stop   12/23/20 2000  ceFAZolin (ANCEF) IVPB 2g/100 mL premix        2 g 200 mL/hr over 30 Minutes Intravenous Every 8 hours 12/23/20 1631 12/24/20 0420   12/23/20 0930  ceFAZolin (ANCEF) IVPB 2g/100 mL premix        2  g 200 mL/hr over 30 Minutes Intravenous On call to O.R. 12/23/20 0916 12/23/20 1206       Discharge Exam: Blood pressure 112/67, pulse 70, temperature 98 F (36.7 C), temperature source Oral, resp. rate 18, height '5\' 1"'$  (1.549 m), weight 55.8 kg, SpO2 96 %. Neurologic: Grossly normal Dressing dry  Discharge Medications:   Allergies as of 12/25/2020   No Known Allergies      Medication List     TAKE these medications    atorvastatin 10 MG tablet Commonly known as: LIPITOR Take 0.5 tablets (5 mg total) by mouth at bedtime.   clobetasol ointment 0.05 % Commonly known as: TEMOVATE Apply 1 application topically daily as needed (vaginitis).   conjugated estrogens vaginal cream Commonly known as: PREMARIN Place 1 Applicatorful vaginally daily.   estradiol 0.5 MG tablet Commonly known as: ESTRACE Take 1 tablet (0.5 mg total) by mouth at bedtime. What changed:  when to take this reasons to take this   hydrochlorothiazide 12.5 MG tablet Commonly known as: HYDRODIURIL Take one tablet by mouth once daily.   HYDROcodone-acetaminophen 7.5-325 MG tablet Commonly known as: NORCO Take 1 tablet by mouth every 6 (six) hours.   latanoprost 0.005 % ophthalmic solution Commonly known as: XALATAN Place 1 drop into both eyes at bedtime.   levothyroxine 125 MCG tablet Commonly known as: SYNTHROID Take 1 tablet (125 mcg total) by mouth daily.   losartan 100 MG tablet Commonly known as: COZAAR Take one tablet by mouth once daily.   multivitamin  tablet Take 1 tablet by mouth daily with lunch.   QC TUMERIC COMPLEX PO Take 1,500 Units by mouth 2 (two) times daily.   vitamin C 500 MG tablet Commonly known as: ASCORBIC ACID Take 500 mg by mouth daily with lunch.   Vitamin D3 Super Strength 50 MCG (2000 UT) Tabs Generic drug: Cholecalciferol Take 2,000 Units by mouth daily.        Disposition: home  Final Dx: Lum lam L2-3  Discharge Instructions      Remove  dressing in 72 hours   Complete by: As directed    Call MD for:  difficulty breathing, headache or visual disturbances   Complete by: As directed    Call MD for:  persistant nausea and vomiting   Complete by: As directed    Call MD for:  redness, tenderness, or signs of infection (pain, swelling, redness, odor or green/yellow discharge around incision site)   Complete by: As directed    Call MD for:  severe uncontrolled pain   Complete by: As directed    Call MD for:  temperature >100.4   Complete by: As directed    Diet - low sodium heart healthy   Complete by: As directed    Increase activity slowly   Complete by: As directed           Signed: Eustace Moore 12/25/2020, 4:00 PM  '

## 2020-12-25 NOTE — Evaluation (Signed)
Physical Therapy Evaluation Patient Details Name: Carol Wells MRN: [id number] DOB: 02/23/1932 Today's Date: 12/25/2020   History of Present Illness  Patient is a 87 y.o. female admitted for lumbar stenosis. s/p Decompressive lumbar laminectomy, medial facetectomy foraminotomies L2-3 8/23 with Eustace Moore, MD. PMH includes carotid stenosis, glaucoma, hx of spinal stenosis, cardiac catheterization, hyperlipidemia, HTN, hypothyroidism, R total hip arthroplasty, R reverse shoulder arthroplasty previous laminectomy at L3-4 and L4-5.  Clinical Impression  Pt admitted with above diagnosis. At baseline, pt is independent and enjoys gardening.  She lives at [place] with spouse.  Today, pt ambulated 150' and with good safety awareness and teach back of back  precautions.  Did advise use of RW at home due to mild instabilities - pt agrees.  Will maintain on caseload to advance while hospitalized, but does demonstrate mobility necessary to return home safely from PT perspective.  Pt currently with functional limitations due to the deficits listed below (see PT Problem List). Pt will benefit from skilled PT to increase their independence and safety with mobility to allow discharge to the venue listed below.       Follow Up Recommendations Supervision for mobility/OOB;No PT follow up    Equipment Recommendations  None recommended by PT    Recommendations for Other Services       Precautions / Restrictions Precautions Precautions: Back Precaution Booklet Issued: Yes (comment) (back handout) Precaution Comments: no brace ordered      Mobility  Bed Mobility Overal bed mobility: Modified Independent             General bed mobility comments: Pt used rail and demonstrated log roll without cues    Transfers Overall transfer level: Needs assistance Equipment used: Straight cane Transfers: Sit to/from Stand Sit to Stand: Supervision         General transfer comment: Cues for  hand placement to push up  Ambulation/Gait Ambulation/Gait assistance: Min guard Gait Distance (Feet): 150 Feet Assistive device: Straight cane   Gait velocity: decreased   General Gait Details: Occasional narrow base of support with drifting but no LOB when using cane; advised use of RW - pt agrees  Financial trader Rankin (Stroke Patients Only)       Balance Overall balance assessment: Needs assistance Sitting-balance support: No upper extremity supported;Feet supported Sitting balance-Leahy Scale: Good     Standing balance support: Single extremity supported Standing balance-Leahy Scale: Fair Standing balance comment: Could static stand without support but prefers UE support form mobility and mild instability without support                             Pertinent Vitals/Pain Pain Assessment: No/denies pain    Home Living Family/patient expects to be discharged to:: Private residence Living Arrangements: Spouse/significant other Available Help at Discharge: Family;Available 24 hours/day Type of Home: House Home Access: Level entry     Home Layout: One level Home Equipment: Shower seat - built in;Toilet riser;Walker - 2 wheels;Walker - 4 wheels;Grab bars - tub/shower Additional Comments: pt's son is visiting from [place]; Pt lives at [facility]    Prior Function Level of Independence: Independent         Comments: no AD, enjoys gardening     Hand Dominance   Dominant Hand: Right    Extremity/Trunk Assessment   Upper Extremity Assessment Upper Extremity  Assessment: Defer to OT evaluation    Lower Extremity Assessment Lower Extremity Assessment: RLE deficits/detail;LLE deficits/detail RLE Deficits / Details: ROM WFL; MMT at least 3/5 but not further tested due to acute back surgery RLE Sensation: WNL RLE Coordination: WNL LLE Deficits / Details: ROM WFL; MMT at least 3/5 but not further tested due  to acute back surgery LLE Sensation: WNL LLE Coordination: WNL    Cervical / Trunk Assessment Cervical / Trunk Assessment: Normal  Communication   Communication: No difficulties  Cognition Arousal/Alertness: Awake/alert Behavior During Therapy: WFL for tasks assessed/performed Overall Cognitive Status: Within Functional Limits for tasks assessed                                 General Comments: good safety awareness      General Comments General comments (skin integrity, edema, etc.): VSS.  Pt recalling back precautions and explaining them without cues.  Did provide back precaution handout    Exercises     Assessment/Plan    PT Assessment Patient needs continued PT services  PT Problem List Decreased strength;Decreased mobility;Decreased knowledge of precautions;Decreased activity tolerance;Decreased balance;Decreased knowledge of use of DME       PT Treatment Interventions DME instruction;Therapeutic activities;Gait training;Therapeutic exercise;Patient/family education;Stair training;Balance training;Functional mobility training    PT Goals (Current goals can be found in the Care Plan section)  Acute Rehab PT Goals Patient Stated Goal: to go home today PT Goal Formulation: With patient Time For Goal Achievement: 01/08/21 Potential to Achieve Goals: Good    Frequency Min 5X/week   Barriers to discharge        Co-evaluation               AM-PAC PT "6 Clicks" Mobility  Outcome Measure Help needed turning from your back to your side while in a flat bed without using bedrails?: None Help needed moving from lying on your back to sitting on the side of a flat bed without using bedrails?: A Little Help needed moving to and from a bed to a chair (including a wheelchair)?: A Little Help needed standing up from a chair using your arms (e.g., wheelchair or bedside chair)?: A Little Help needed to walk in hospital room?: A Little Help needed climbing 3-5  steps with a railing? : A Little 6 Click Score: 19    End of Session   Activity Tolerance: Patient tolerated treatment well Patient left: in bed;with call bell/phone within reach (Sitting EOB; did not turn on alarm b/c pt following commands and good safey awareness (reports if she shifts weight it goes off, states will call if needs to stand up)) Nurse Communication: Mobility status PT Visit Diagnosis: Other abnormalities of gait and mobility (R26.89)    Time: [id number] PT Time Calculation (min) (ACUTE ONLY): 22 min   Charges:   PT Evaluation $PT Eval Low Complexity: 1 Low          Darina Hartwell, PT Acute Rehab Services Pager [phone] [facility] [phone]   Karlton Lemon 12/25/2020, 2:30 PM

## 2020-12-27 ENCOUNTER — Telehealth: Payer: Self-pay | Admitting: *Deleted

## 2020-12-27 NOTE — Telephone Encounter (Signed)
Transition Care Management Follow-up Telephone Call Date of discharge and from where: 12/25/2020 [facility] How have you been since you were released from the hospital? Better, still slow Any questions or concerns? No  Items Reviewed: Did the pt receive and understand the discharge instructions provided? Yes  Medications obtained and verified? Yes  Other? No  Any new allergies since your discharge? No  Dietary orders reviewed? Yes Do you have support at home? Yes   Home Care and Equipment/Supplies: Were home health services ordered? no If so, what is the name of the agency?   Has the agency set up a time to come to the patient's home? not applicable Were any new equipment or medical supplies ordered?  No What is the name of the medical supply agency? na Were you able to get the supplies/equipment? not applicable Do you have any questions related to the use of the equipment or supplies? No  Functional Questionnaire: (I = Independent and D = Dependent) ADLs: I with assistance uses Scientist, research (medical)- I  Meal Prep- I  Eating- I  Maintaining continence- I  Transferring/Ambulation- I with assistance uses walker  Managing Meds- I  Follow up appointments reviewed:  PCP Hospital f/u appt confirmed? Yes  Scheduled to see ManXie on 01/02/2021 @ 2:30. [facility] f/u appt confirmed? No   Are transportation arrangements needed? No  If their condition worsens, is the pt aware to call PCP or go to the Emergency Dept.? Yes Was the patient provided with contact information for the PCP's office or ED? Yes Was to pt encouraged to call back with questions or concerns? Yes

## 2021-01-02 ENCOUNTER — Encounter: Payer: BLUE CROSS/BLUE SHIELD | Admitting: Nurse Practitioner

## 2021-01-28 DIAGNOSIS — R2681 Unsteadiness on feet: Secondary | ICD-10-CM | POA: Diagnosis not present

## 2021-01-28 DIAGNOSIS — M48061 Spinal stenosis, lumbar region without neurogenic claudication: Secondary | ICD-10-CM | POA: Diagnosis not present

## 2021-01-28 DIAGNOSIS — M6281 Muscle weakness (generalized): Secondary | ICD-10-CM | POA: Diagnosis not present

## 2021-01-30 DIAGNOSIS — R2681 Unsteadiness on feet: Secondary | ICD-10-CM | POA: Diagnosis not present

## 2021-01-30 DIAGNOSIS — M48061 Spinal stenosis, lumbar region without neurogenic claudication: Secondary | ICD-10-CM | POA: Diagnosis not present

## 2021-01-30 DIAGNOSIS — M6281 Muscle weakness (generalized): Secondary | ICD-10-CM | POA: Diagnosis not present

## 2021-02-03 DIAGNOSIS — M6281 Muscle weakness (generalized): Secondary | ICD-10-CM | POA: Diagnosis not present

## 2021-02-03 DIAGNOSIS — R2681 Unsteadiness on feet: Secondary | ICD-10-CM | POA: Diagnosis not present

## 2021-02-03 DIAGNOSIS — M48061 Spinal stenosis, lumbar region without neurogenic claudication: Secondary | ICD-10-CM | POA: Diagnosis not present

## 2021-02-04 ENCOUNTER — Other Ambulatory Visit: Payer: Self-pay | Admitting: Nurse Practitioner

## 2021-02-05 DIAGNOSIS — M6281 Muscle weakness (generalized): Secondary | ICD-10-CM | POA: Diagnosis not present

## 2021-02-05 DIAGNOSIS — M48061 Spinal stenosis, lumbar region without neurogenic claudication: Secondary | ICD-10-CM | POA: Diagnosis not present

## 2021-02-05 DIAGNOSIS — R2681 Unsteadiness on feet: Secondary | ICD-10-CM | POA: Diagnosis not present

## 2021-02-07 DIAGNOSIS — R2681 Unsteadiness on feet: Secondary | ICD-10-CM | POA: Diagnosis not present

## 2021-02-07 DIAGNOSIS — M48061 Spinal stenosis, lumbar region without neurogenic claudication: Secondary | ICD-10-CM | POA: Diagnosis not present

## 2021-02-07 DIAGNOSIS — M6281 Muscle weakness (generalized): Secondary | ICD-10-CM | POA: Diagnosis not present

## 2021-02-10 DIAGNOSIS — M6281 Muscle weakness (generalized): Secondary | ICD-10-CM | POA: Diagnosis not present

## 2021-02-10 DIAGNOSIS — R2681 Unsteadiness on feet: Secondary | ICD-10-CM | POA: Diagnosis not present

## 2021-02-10 DIAGNOSIS — M48061 Spinal stenosis, lumbar region without neurogenic claudication: Secondary | ICD-10-CM | POA: Diagnosis not present

## 2021-02-12 DIAGNOSIS — M48061 Spinal stenosis, lumbar region without neurogenic claudication: Secondary | ICD-10-CM | POA: Diagnosis not present

## 2021-02-12 DIAGNOSIS — M6281 Muscle weakness (generalized): Secondary | ICD-10-CM | POA: Diagnosis not present

## 2021-02-12 DIAGNOSIS — R2681 Unsteadiness on feet: Secondary | ICD-10-CM | POA: Diagnosis not present

## 2021-02-13 ENCOUNTER — Other Ambulatory Visit: Payer: Medicare Other

## 2021-02-13 ENCOUNTER — Other Ambulatory Visit: Payer: Self-pay

## 2021-02-13 DIAGNOSIS — E871 Hypo-osmolality and hyponatremia: Secondary | ICD-10-CM

## 2021-02-13 DIAGNOSIS — E039 Hypothyroidism, unspecified: Secondary | ICD-10-CM

## 2021-02-13 DIAGNOSIS — K922 Gastrointestinal hemorrhage, unspecified: Secondary | ICD-10-CM

## 2021-02-14 DIAGNOSIS — M48061 Spinal stenosis, lumbar region without neurogenic claudication: Secondary | ICD-10-CM | POA: Diagnosis not present

## 2021-02-14 DIAGNOSIS — R2681 Unsteadiness on feet: Secondary | ICD-10-CM | POA: Diagnosis not present

## 2021-02-14 DIAGNOSIS — M6281 Muscle weakness (generalized): Secondary | ICD-10-CM | POA: Diagnosis not present

## 2021-02-14 LAB — CBC WITH DIFFERENTIAL/PLATELET
Absolute Monocytes: 490 cells/uL (ref 200–950)
Basophils Absolute: 18 cells/uL (ref 0–200)
Basophils Relative: 0.3 %
Eosinophils Absolute: 242 cells/uL (ref 15–500)
Eosinophils Relative: 4.1 %
HCT: 36.5 % (ref 35.0–45.0)
Hemoglobin: 12.2 g/dL (ref 11.7–15.5)
Lymphs Abs: 1912 cells/uL (ref 850–3900)
MCH: 30.7 pg (ref 27.0–33.0)
MCHC: 33.4 g/dL (ref 32.0–36.0)
MCV: 91.7 fL (ref 80.0–100.0)
MPV: 10 fL (ref 7.5–12.5)
Monocytes Relative: 8.3 %
Neutro Abs: 3239 cells/uL (ref 1500–7800)
Neutrophils Relative %: 54.9 %
Platelets: 266 10*3/uL (ref 140–400)
RBC: 3.98 10*6/uL (ref 3.80–5.10)
RDW: 12 % (ref 11.0–15.0)
Total Lymphocyte: 32.4 %
WBC: 5.9 10*3/uL (ref 3.8–10.8)

## 2021-02-14 LAB — COMPLETE METABOLIC PANEL WITH GFR
AG Ratio: 1.6 (calc) (ref 1.0–2.5)
ALT: 15 U/L (ref 6–29)
AST: 23 U/L (ref 10–35)
Albumin: 3.9 g/dL (ref 3.6–5.1)
Alkaline phosphatase (APISO): 53 U/L (ref 37–153)
BUN/Creatinine Ratio: 28 (calc) — ABNORMAL HIGH (ref 6–22)
BUN: 16 mg/dL (ref 7–25)
CO2: 31 mmol/L (ref 20–32)
Calcium: 9 mg/dL (ref 8.6–10.4)
Chloride: 102 mmol/L (ref 98–110)
Creat: 0.57 mg/dL — ABNORMAL LOW (ref 0.60–0.95)
Globulin: 2.5 g/dL (calc) (ref 1.9–3.7)
Glucose, Bld: 81 mg/dL (ref 65–99)
Potassium: 4 mmol/L (ref 3.5–5.3)
Sodium: 139 mmol/L (ref 135–146)
Total Bilirubin: 0.7 mg/dL (ref 0.2–1.2)
Total Protein: 6.4 g/dL (ref 6.1–8.1)
eGFR: 88 mL/min/{1.73_m2} (ref >=60)

## 2021-02-14 LAB — TSH: TSH: 2.02 mIU/L (ref 0.40–4.50)

## 2021-02-17 DIAGNOSIS — M6281 Muscle weakness (generalized): Secondary | ICD-10-CM | POA: Diagnosis not present

## 2021-02-17 DIAGNOSIS — R2681 Unsteadiness on feet: Secondary | ICD-10-CM | POA: Diagnosis not present

## 2021-02-17 DIAGNOSIS — M48061 Spinal stenosis, lumbar region without neurogenic claudication: Secondary | ICD-10-CM | POA: Diagnosis not present

## 2021-02-17 DIAGNOSIS — Z23 Encounter for immunization: Secondary | ICD-10-CM | POA: Diagnosis not present

## 2021-02-19 ENCOUNTER — Encounter: Payer: Self-pay | Admitting: Family Medicine

## 2021-02-19 ENCOUNTER — Other Ambulatory Visit: Payer: Self-pay

## 2021-02-19 ENCOUNTER — Non-Acute Institutional Stay: Payer: Medicare Other | Admitting: Family Medicine

## 2021-02-19 VITALS — BP 134/86 | HR 81 | Temp 97.3°F | Ht 61.0 in | Wt 122.8 lb

## 2021-02-19 DIAGNOSIS — C44300 Unspecified malignant neoplasm of skin of unspecified part of face: Secondary | ICD-10-CM | POA: Diagnosis not present

## 2021-02-19 DIAGNOSIS — M6281 Muscle weakness (generalized): Secondary | ICD-10-CM | POA: Diagnosis not present

## 2021-02-19 DIAGNOSIS — E785 Hyperlipidemia, unspecified: Secondary | ICD-10-CM

## 2021-02-19 DIAGNOSIS — R2681 Unsteadiness on feet: Secondary | ICD-10-CM | POA: Diagnosis not present

## 2021-02-19 DIAGNOSIS — N952 Postmenopausal atrophic vaginitis: Secondary | ICD-10-CM | POA: Diagnosis not present

## 2021-02-19 DIAGNOSIS — I10 Essential (primary) hypertension: Secondary | ICD-10-CM

## 2021-02-19 DIAGNOSIS — M48061 Spinal stenosis, lumbar region without neurogenic claudication: Secondary | ICD-10-CM

## 2021-02-19 NOTE — Addendum Note (Signed)
Addended by: Rafael Bihari A on: 02/19/2021 03:09 PM   Modules accepted: Orders

## 2021-02-19 NOTE — Addendum Note (Signed)
Addended by: Wardell Honour on: 02/19/2021 04:01 PM   Modules accepted: Orders

## 2021-02-19 NOTE — Progress Notes (Signed)
Provider:  Alain Honey, MD  Careteam: Patient Care Team: Mast, Man X, NP as PCP - General (Internal Medicine) Fay Records, MD as PCP - Cardiology (Cardiology)  PLACE OF SERVICE:  [facility] Directive information    No Known Allergies  No chief complaint on file.    HPI: Patient is a 87 y.o. female .  This nice lady presents today for medical management of chronic problems including lumbar disc disease, hypertension, hypothyroidism, and history of gastrointestinal bleed.  She had disc surgery with laminectomy about 2 months ago.  She is still receiving physical therapy but doing well without pain.  She had been put on muscle relaxants as well as gabapentin but has discontinued those. We reviewed her labs from last week today basically they are all good.  She only takes 10 mg of atorvastatin and we discussed possibility of discontinuing that is such a low dose and numbers looking as good as they do.  We also discussed blood pressure.  She takes losartan once a day as well as hydrochlorothiazide.  Pressures have been fluctuating somewhat. No recent GI problems and hemoglobin supports lack of GI bleeding  Review of Systems:  Review of Systems  Gastrointestinal:  Negative for nausea.  All other systems reviewed and are negative.  Past Medical History:  Diagnosis Date   Anemia 11/2015   Arthritis    Blood transfusion 2017 JULY 22   Carotid stenosis    Dopplers 2/54: RICA 27-06%, LICA 2-37%   Cataract    REMOVED   Chronic headaches    HX MIGRAINES   Collagenous colitis    Diverticulosis 2013   Colonoscopy   Gastric ulcer 11/2015   Glaucoma    BOTH EYES   Hiatal hernia    History of spinal stenosis    History of uterine fibroid    Hx of cardiac catheterization    LHC 3/14: no angiographic CAD, EF 65%   Hx of echocardiogram    Echocardiogram 06/21/12: Mild LVH, EF 62-83%, grade 2 diastolic dysfunction, mild MR   Hyperlipidemia    Hypertension     Hypothyroidism    Internal hemorrhoids 2009   Colonoscopy   Migraines    UTI (urinary tract infection) HX OF   Past Surgical History:  Procedure Laterality Date   BUNIONECTOMY  YRS AGO LEFT   RIGHT [facility] 3 YRS AGO   BUNIONECTOMY Left    CATARACT EXTRACTION W/ INTRAOCULAR LENS IMPLANT  03/2010   Left   CATARACT EXTRACTION W/ INTRAOCULAR LENS IMPLANT Bilateral 11/2010   COLONOSCOPY  2013   NORMAL    ESOPHAGOGASTRODUODENOSCOPY N/A 11/23/2015   Procedure: ESOPHAGOGASTRODUODENOSCOPY (EGD);  Surgeon: Rogene Houston, MD;  [facility];  Service: Endoscopy;  Laterality: N/A;   EUS N/A 12/19/2015   Procedure: ESOPHAGEAL ENDOSCOPIC ULTRASOUND (EUS) RADIAL;  Surgeon: Milus Banister, MD;  Location: [facility];  Service: Endoscopy;  Laterality: N/A;   FUNCTIONAL ENDOSCOPIC SINUS SURGERY     HAMMER TOE SURGERY     HAND SURGERY Right 1998   INGUINAL HERNIA REPAIR Left 03/11/2016   Procedure: LEFT INGUINAL HERINA REPAIR WITH MESH;  Surgeon: Donnie Mesa, MD;  Location: [facility];  Service: General;  Laterality: Left;   INSERTION OF MESH Left 03/11/2016   Procedure: INSERTION OF MESH;  Surgeon: Donnie Mesa, MD;  Location: [facility];  Service: General;  Laterality: Left;   IR RADIOLOGIST EVAL & MGMT  07/01/2017   LAMINECTOMY  03/2008  LAPAROTOMY N/A 11/11/2017   Procedure: MINI LAPAROTOMY;  Surgeon: Everitt Amber, MD;  [facility]: [facility];  Service: Gynecology;  Laterality: N/A;   LUMBAR LAMINECTOMY/DECOMPRESSION MICRODISCECTOMY N/A 12/23/2020   Procedure: Laminectomy and Foraminotomy - Lumbar one-two;  Surgeon: Eustace Moore, MD;  Location: [facility];  Service: Neurosurgery;  Laterality: N/A;   POLYPECTOMY     REVERSE SHOULDER ARTHROPLASTY Right 10/13/2018   Procedure: REVERSE SHOULDER ARTHROPLASTY;  Surgeon: Justice Britain, MD;  Location: [facility];  Service: Orthopedics;  Laterality: Right;  175min   ROBOTIC ASSISTED TOTAL HYSTERECTOMY WITH BILATERAL SALPINGO OOPHERECTOMY Bilateral 11/11/2017    Procedure: XI ROBOTIC ASSISTED TOTAL HYSTERECTOMY WITH BILATERAL SALPINGO OOPHORECTOMY FOR UTERUS GREATER THAN 250 GRAMS;  Surgeon: Everitt Amber, MD;  Location: [facility];  Service: Gynecology;  Laterality: Bilateral;   TONSILLECTOMY     TOTAL HIP ARTHROPLASTY  2008   Right   Social History:   reports that she has never smoked. She has never used smokeless tobacco. She reports current alcohol use. She reports that she does not use drugs.  Family History  Problem Relation Age of Onset   Colon cancer Brother 59   Anuerysm Father 15    Medications: Patient's Medications  New Prescriptions   No medications on file  Previous Medications   ATORVASTATIN (LIPITOR) 10 MG TABLET    Take 0.5 tablets (5 mg total) by mouth at bedtime.   CHOLECALCIFEROL (VITAMIN D3 SUPER STRENGTH) 50 MCG (2000 UT) TABS    Take 2,000 Units by mouth daily.   CLOBETASOL OINTMENT (TEMOVATE) 0.05 %    Apply 1 application topically daily as needed (vaginitis).   CONJUGATED ESTROGENS (PREMARIN) VAGINAL CREAM    Place 1 Applicatorful vaginally daily.   ESTRADIOL (ESTRACE) 0.5 MG TABLET    TAKE 1 TABLET BY MOUTH AT BEDTIME   HYDROCHLOROTHIAZIDE (HYDRODIURIL) 12.5 MG TABLET    Take one tablet by mouth once daily.   HYDROCODONE-ACETAMINOPHEN (NORCO) 7.5-325 MG TABLET    Take 1 tablet by mouth every 6 (six) hours.   LATANOPROST (XALATAN) 0.005 % OPHTHALMIC SOLUTION    Place 1 drop into both eyes at bedtime.   LEVOTHYROXINE (SYNTHROID) 125 MCG TABLET    Take 1 tablet (125 mcg total) by mouth daily.   LOSARTAN (COZAAR) 100 MG TABLET    Take one tablet by mouth once daily.   MULTIPLE VITAMIN (MULTIVITAMIN) TABLET    Take 1 tablet by mouth daily with lunch.   TURMERIC (QC TUMERIC COMPLEX PO)    Take 1,500 Units by mouth 2 (two) times daily.   VITAMIN C (ASCORBIC ACID) 500 MG TABLET    Take 500 mg by mouth daily with lunch.  Modified Medications   No medications on file  Discontinued Medications   No medications on file     Physical Exam:  There were no vitals filed for this visit. There is no height or weight on file to calculate BMI. Wt Readings from Last 3 Encounters:  12/23/20 123 lb (55.8 kg)  12/12/20 122 lb (55.3 kg)  10/16/20 123 lb (55.8 kg)    Physical Exam Vitals and nursing note reviewed.  Constitutional:      Appearance: Normal appearance.  Cardiovascular:     Rate and Rhythm: Normal rate and regular rhythm.  Pulmonary:     Effort: Pulmonary effort is normal.     Breath sounds: Normal breath sounds.  Skin:    Comments: She has a small lesion at the corner of her mouth on the right  side.  Has elevated border and is suspicious for skin cancer.  Have recommended to follow-up with dermatology  Neurological:     General: No focal deficit present.     Mental Status: She is alert and oriented to person, place, and time.    Labs reviewed: Basic Metabolic Panel: Recent Labs    02/27/20 0740 06/19/20 1348 12/12/20 1104 02/13/21 0715  NA 138 134* 135 139  K 4.3 3.9 3.6 4.0  CL 101 97 98 102  CO2 31 30 31 31   GLUCOSE 93 95 95 81  BUN 15 15 15 16   CREATININE 0.61 0.67 0.68 0.57*  CALCIUM 9.4 9.4 9.0 9.0  TSH 2.43  --   --  2.02   Liver Function Tests: Recent Labs    02/27/20 0740 06/19/20 1348 02/13/21 0715  AST 23 20 23   ALT 16 15 15   ALKPHOS  --  54  --   BILITOT 0.7 0.6 0.7  PROT 6.8 7.7 6.4  ALBUMIN  --  4.1  --    Recent Labs    06/19/20 1348  LIPASE 43.0   No results for input(s): AMMONIA in the last 8760 hours. CBC: Recent Labs    06/19/20 1348 12/12/20 1104 02/13/21 0715  WBC 5.7 7.0 5.9  NEUTROABS 3.7 4.2 3,239  HGB 13.4 13.0 12.2  HCT 39.2 39.2 36.5  MCV 90.1 92.0 91.7  PLT 247.0 262 266   Lipid Panel: Recent Labs    02/27/20 0740  CHOL 150  HDL 60  LDLCALC 75  TRIG 69  CHOLHDL 2.5   TSH: Recent Labs    02/27/20 0740 02/13/21 0715  TSH 2.43 2.02   A1C: No results found for: HGBA1C   Assessment/Plan  1. Hyperlipidemia,  unspecified hyperlipidemia type Labs were done last week but lipid panel was admitted.  Since she is on statin and its been a year I think we need to - Lipid Panel; Future  2. Atrophic vaginitis Patient taking estrogen supplement for this problem  3. Degenerative lumbar spinal stenosis Status post surgery and doing well  4. Primary hypertension Pressures have fluctuated.  We have a plan to divide her losartan in half and take half in the morning and half in the evening along with the hydrochlorothiazide.  She will monitor pressures   Alain Honey, MD [place] [phone]

## 2021-02-20 ENCOUNTER — Other Ambulatory Visit: Payer: Self-pay

## 2021-02-20 ENCOUNTER — Other Ambulatory Visit: Payer: Medicare Other

## 2021-02-20 ENCOUNTER — Telehealth: Payer: Self-pay | Admitting: *Deleted

## 2021-02-20 DIAGNOSIS — E785 Hyperlipidemia, unspecified: Secondary | ICD-10-CM

## 2021-02-20 NOTE — Telephone Encounter (Signed)
Patient called this morning and stated that she had her blood drawn this morning but the lab person did not have orders.   Orders were placed at 2:14 yesterday afternoon after [facility] faxed the orders over.   Charlene, ([facility] tech filling in for [place]), Released and fax the order over to [employer] and has emailed the tech to add orders to the bloodwork drawn.   Patient notified of process.

## 2021-02-21 DIAGNOSIS — R2681 Unsteadiness on feet: Secondary | ICD-10-CM | POA: Diagnosis not present

## 2021-02-21 DIAGNOSIS — M6281 Muscle weakness (generalized): Secondary | ICD-10-CM | POA: Diagnosis not present

## 2021-02-21 DIAGNOSIS — M48061 Spinal stenosis, lumbar region without neurogenic claudication: Secondary | ICD-10-CM | POA: Diagnosis not present

## 2021-02-22 LAB — LIPID PANEL
Cholesterol: 161 mg/dL (ref <200)
HDL: 58 mg/dL (ref >=50)
LDL Cholesterol (Calc): 87 mg/dL (calc)
Non-HDL Cholesterol (Calc): 103 mg/dL (calc) (ref <130)
Total CHOL/HDL Ratio: 2.8 (calc) (ref <5.0)
Triglycerides: 70 mg/dL (ref <150)

## 2021-02-24 DIAGNOSIS — R2681 Unsteadiness on feet: Secondary | ICD-10-CM | POA: Diagnosis not present

## 2021-02-24 DIAGNOSIS — M6281 Muscle weakness (generalized): Secondary | ICD-10-CM | POA: Diagnosis not present

## 2021-02-24 DIAGNOSIS — M48061 Spinal stenosis, lumbar region without neurogenic claudication: Secondary | ICD-10-CM | POA: Diagnosis not present

## 2021-02-26 DIAGNOSIS — R2681 Unsteadiness on feet: Secondary | ICD-10-CM | POA: Diagnosis not present

## 2021-02-26 DIAGNOSIS — M6281 Muscle weakness (generalized): Secondary | ICD-10-CM | POA: Diagnosis not present

## 2021-02-26 DIAGNOSIS — M48061 Spinal stenosis, lumbar region without neurogenic claudication: Secondary | ICD-10-CM | POA: Diagnosis not present

## 2021-02-28 DIAGNOSIS — M6281 Muscle weakness (generalized): Secondary | ICD-10-CM | POA: Diagnosis not present

## 2021-02-28 DIAGNOSIS — R2681 Unsteadiness on feet: Secondary | ICD-10-CM | POA: Diagnosis not present

## 2021-02-28 DIAGNOSIS — M48061 Spinal stenosis, lumbar region without neurogenic claudication: Secondary | ICD-10-CM | POA: Diagnosis not present

## 2021-03-03 DIAGNOSIS — M48061 Spinal stenosis, lumbar region without neurogenic claudication: Secondary | ICD-10-CM | POA: Diagnosis not present

## 2021-03-03 DIAGNOSIS — M6281 Muscle weakness (generalized): Secondary | ICD-10-CM | POA: Diagnosis not present

## 2021-03-03 DIAGNOSIS — R2681 Unsteadiness on feet: Secondary | ICD-10-CM | POA: Diagnosis not present

## 2021-03-05 DIAGNOSIS — M6281 Muscle weakness (generalized): Secondary | ICD-10-CM | POA: Diagnosis not present

## 2021-03-05 DIAGNOSIS — M48061 Spinal stenosis, lumbar region without neurogenic claudication: Secondary | ICD-10-CM | POA: Diagnosis not present

## 2021-03-05 DIAGNOSIS — R2681 Unsteadiness on feet: Secondary | ICD-10-CM | POA: Diagnosis not present

## 2021-03-07 DIAGNOSIS — M48061 Spinal stenosis, lumbar region without neurogenic claudication: Secondary | ICD-10-CM | POA: Diagnosis not present

## 2021-03-07 DIAGNOSIS — M6281 Muscle weakness (generalized): Secondary | ICD-10-CM | POA: Diagnosis not present

## 2021-03-07 DIAGNOSIS — R2681 Unsteadiness on feet: Secondary | ICD-10-CM | POA: Diagnosis not present

## 2021-03-10 ENCOUNTER — Other Ambulatory Visit: Payer: Self-pay | Admitting: Nurse Practitioner

## 2021-03-10 DIAGNOSIS — M6281 Muscle weakness (generalized): Secondary | ICD-10-CM | POA: Diagnosis not present

## 2021-03-10 DIAGNOSIS — M48061 Spinal stenosis, lumbar region without neurogenic claudication: Secondary | ICD-10-CM | POA: Diagnosis not present

## 2021-03-10 DIAGNOSIS — R2681 Unsteadiness on feet: Secondary | ICD-10-CM | POA: Diagnosis not present

## 2021-03-12 DIAGNOSIS — R2681 Unsteadiness on feet: Secondary | ICD-10-CM | POA: Diagnosis not present

## 2021-03-12 DIAGNOSIS — M48061 Spinal stenosis, lumbar region without neurogenic claudication: Secondary | ICD-10-CM | POA: Diagnosis not present

## 2021-03-12 DIAGNOSIS — M6281 Muscle weakness (generalized): Secondary | ICD-10-CM | POA: Diagnosis not present

## 2021-03-14 DIAGNOSIS — M48061 Spinal stenosis, lumbar region without neurogenic claudication: Secondary | ICD-10-CM | POA: Diagnosis not present

## 2021-03-14 DIAGNOSIS — M6281 Muscle weakness (generalized): Secondary | ICD-10-CM | POA: Diagnosis not present

## 2021-03-14 DIAGNOSIS — R2681 Unsteadiness on feet: Secondary | ICD-10-CM | POA: Diagnosis not present

## 2021-03-17 DIAGNOSIS — R2681 Unsteadiness on feet: Secondary | ICD-10-CM | POA: Diagnosis not present

## 2021-03-17 DIAGNOSIS — M6281 Muscle weakness (generalized): Secondary | ICD-10-CM | POA: Diagnosis not present

## 2021-03-17 DIAGNOSIS — M48061 Spinal stenosis, lumbar region without neurogenic claudication: Secondary | ICD-10-CM | POA: Diagnosis not present

## 2021-03-19 DIAGNOSIS — R2681 Unsteadiness on feet: Secondary | ICD-10-CM | POA: Diagnosis not present

## 2021-03-19 DIAGNOSIS — M48061 Spinal stenosis, lumbar region without neurogenic claudication: Secondary | ICD-10-CM | POA: Diagnosis not present

## 2021-03-19 DIAGNOSIS — M6281 Muscle weakness (generalized): Secondary | ICD-10-CM | POA: Diagnosis not present

## 2021-03-21 DIAGNOSIS — R2681 Unsteadiness on feet: Secondary | ICD-10-CM | POA: Diagnosis not present

## 2021-03-21 DIAGNOSIS — M48061 Spinal stenosis, lumbar region without neurogenic claudication: Secondary | ICD-10-CM | POA: Diagnosis not present

## 2021-03-21 DIAGNOSIS — M6281 Muscle weakness (generalized): Secondary | ICD-10-CM | POA: Diagnosis not present

## 2021-03-24 DIAGNOSIS — R2681 Unsteadiness on feet: Secondary | ICD-10-CM | POA: Diagnosis not present

## 2021-03-24 DIAGNOSIS — M6281 Muscle weakness (generalized): Secondary | ICD-10-CM | POA: Diagnosis not present

## 2021-03-24 DIAGNOSIS — M48061 Spinal stenosis, lumbar region without neurogenic claudication: Secondary | ICD-10-CM | POA: Diagnosis not present

## 2021-03-26 DIAGNOSIS — M6281 Muscle weakness (generalized): Secondary | ICD-10-CM | POA: Diagnosis not present

## 2021-03-26 DIAGNOSIS — M48061 Spinal stenosis, lumbar region without neurogenic claudication: Secondary | ICD-10-CM | POA: Diagnosis not present

## 2021-03-26 DIAGNOSIS — R2681 Unsteadiness on feet: Secondary | ICD-10-CM | POA: Diagnosis not present

## 2021-03-28 DIAGNOSIS — M48061 Spinal stenosis, lumbar region without neurogenic claudication: Secondary | ICD-10-CM | POA: Diagnosis not present

## 2021-03-28 DIAGNOSIS — M6281 Muscle weakness (generalized): Secondary | ICD-10-CM | POA: Diagnosis not present

## 2021-03-28 DIAGNOSIS — R2681 Unsteadiness on feet: Secondary | ICD-10-CM | POA: Diagnosis not present

## 2021-03-31 DIAGNOSIS — M6281 Muscle weakness (generalized): Secondary | ICD-10-CM | POA: Diagnosis not present

## 2021-03-31 DIAGNOSIS — R2681 Unsteadiness on feet: Secondary | ICD-10-CM | POA: Diagnosis not present

## 2021-03-31 DIAGNOSIS — M48061 Spinal stenosis, lumbar region without neurogenic claudication: Secondary | ICD-10-CM | POA: Diagnosis not present

## 2021-04-02 ENCOUNTER — Other Ambulatory Visit: Payer: Self-pay | Admitting: *Deleted

## 2021-04-02 DIAGNOSIS — R2681 Unsteadiness on feet: Secondary | ICD-10-CM | POA: Diagnosis not present

## 2021-04-02 DIAGNOSIS — M48061 Spinal stenosis, lumbar region without neurogenic claudication: Secondary | ICD-10-CM | POA: Diagnosis not present

## 2021-04-02 DIAGNOSIS — M6281 Muscle weakness (generalized): Secondary | ICD-10-CM | POA: Diagnosis not present

## 2021-04-02 MED ORDER — ATORVASTATIN CALCIUM 10 MG PO TABS: 5.0000 mg | ORAL_TABLET | Freq: Every day | ORAL | 3 refills | Status: DC

## 2021-04-02 NOTE — Telephone Encounter (Signed)
Patient called requesting refill

## 2021-04-03 DIAGNOSIS — Z23 Encounter for immunization: Secondary | ICD-10-CM | POA: Diagnosis not present

## 2021-04-04 DIAGNOSIS — M48061 Spinal stenosis, lumbar region without neurogenic claudication: Secondary | ICD-10-CM | POA: Diagnosis not present

## 2021-04-04 DIAGNOSIS — R2681 Unsteadiness on feet: Secondary | ICD-10-CM | POA: Diagnosis not present

## 2021-04-04 DIAGNOSIS — M6281 Muscle weakness (generalized): Secondary | ICD-10-CM | POA: Diagnosis not present

## 2021-04-07 DIAGNOSIS — M6281 Muscle weakness (generalized): Secondary | ICD-10-CM | POA: Diagnosis not present

## 2021-04-07 DIAGNOSIS — M48061 Spinal stenosis, lumbar region without neurogenic claudication: Secondary | ICD-10-CM | POA: Diagnosis not present

## 2021-04-07 DIAGNOSIS — R2681 Unsteadiness on feet: Secondary | ICD-10-CM | POA: Diagnosis not present

## 2021-04-09 DIAGNOSIS — M6281 Muscle weakness (generalized): Secondary | ICD-10-CM | POA: Diagnosis not present

## 2021-04-09 DIAGNOSIS — Z961 Presence of intraocular lens: Secondary | ICD-10-CM | POA: Diagnosis not present

## 2021-04-09 DIAGNOSIS — R2681 Unsteadiness on feet: Secondary | ICD-10-CM | POA: Diagnosis not present

## 2021-04-09 DIAGNOSIS — M48061 Spinal stenosis, lumbar region without neurogenic claudication: Secondary | ICD-10-CM | POA: Diagnosis not present

## 2021-04-09 DIAGNOSIS — H401131 Primary open-angle glaucoma, bilateral, mild stage: Secondary | ICD-10-CM | POA: Diagnosis not present

## 2021-04-10 DIAGNOSIS — Z20822 Contact with and (suspected) exposure to covid-19: Secondary | ICD-10-CM | POA: Diagnosis not present

## 2021-04-11 DIAGNOSIS — M6281 Muscle weakness (generalized): Secondary | ICD-10-CM | POA: Diagnosis not present

## 2021-04-11 DIAGNOSIS — M48061 Spinal stenosis, lumbar region without neurogenic claudication: Secondary | ICD-10-CM | POA: Diagnosis not present

## 2021-04-11 DIAGNOSIS — R2681 Unsteadiness on feet: Secondary | ICD-10-CM | POA: Diagnosis not present

## 2021-04-14 ENCOUNTER — Other Ambulatory Visit: Payer: Self-pay | Admitting: Nurse Practitioner

## 2021-04-16 DIAGNOSIS — C44319 Basal cell carcinoma of skin of other parts of face: Secondary | ICD-10-CM | POA: Diagnosis not present

## 2021-05-06 DIAGNOSIS — M5416 Radiculopathy, lumbar region: Secondary | ICD-10-CM | POA: Diagnosis not present

## 2021-05-14 DIAGNOSIS — Z85828 Personal history of other malignant neoplasm of skin: Secondary | ICD-10-CM | POA: Diagnosis not present

## 2021-05-14 DIAGNOSIS — Z08 Encounter for follow-up examination after completed treatment for malignant neoplasm: Secondary | ICD-10-CM | POA: Diagnosis not present

## 2021-07-21 DIAGNOSIS — Z20822 Contact with and (suspected) exposure to covid-19: Secondary | ICD-10-CM | POA: Diagnosis not present

## 2021-07-23 ENCOUNTER — Other Ambulatory Visit: Payer: Self-pay | Admitting: Nurse Practitioner

## 2021-07-23 NOTE — Telephone Encounter (Signed)
Patient has request refill on medication "Estrace 0.'5mg'$ ". Patient medication last refilled 02/04/2021. Patient medication has Warnings. Medication pend and sent to PCP Mast, Man X, NP . ?

## 2021-08-05 DIAGNOSIS — Z20822 Contact with and (suspected) exposure to covid-19: Secondary | ICD-10-CM | POA: Diagnosis not present

## 2021-08-06 DIAGNOSIS — Z20822 Contact with and (suspected) exposure to covid-19: Secondary | ICD-10-CM | POA: Diagnosis not present

## 2021-08-28 ENCOUNTER — Encounter: Payer: BLUE CROSS/BLUE SHIELD | Admitting: Nurse Practitioner

## 2021-09-10 DIAGNOSIS — Z85828 Personal history of other malignant neoplasm of skin: Secondary | ICD-10-CM | POA: Diagnosis not present

## 2021-09-10 DIAGNOSIS — Z08 Encounter for follow-up examination after completed treatment for malignant neoplasm: Secondary | ICD-10-CM | POA: Diagnosis not present

## 2021-10-07 ENCOUNTER — Other Ambulatory Visit: Payer: Self-pay | Admitting: Nurse Practitioner

## 2021-10-17 DIAGNOSIS — H401431 Capsular glaucoma with pseudoexfoliation of lens, bilateral, mild stage: Secondary | ICD-10-CM | POA: Diagnosis not present

## 2021-10-22 ENCOUNTER — Non-Acute Institutional Stay: Payer: Medicare Other | Admitting: Family Medicine

## 2021-10-22 ENCOUNTER — Encounter: Payer: Self-pay | Admitting: Family Medicine

## 2021-10-22 VITALS — BP 112/68 | HR 80 | Temp 97.5°F | Ht 61.0 in | Wt 130.0 lb

## 2021-10-22 DIAGNOSIS — E039 Hypothyroidism, unspecified: Secondary | ICD-10-CM

## 2021-10-22 DIAGNOSIS — I10 Essential (primary) hypertension: Secondary | ICD-10-CM

## 2021-10-22 DIAGNOSIS — E871 Hypo-osmolality and hyponatremia: Secondary | ICD-10-CM | POA: Diagnosis not present

## 2021-10-22 DIAGNOSIS — E785 Hyperlipidemia, unspecified: Secondary | ICD-10-CM

## 2021-10-22 NOTE — Progress Notes (Signed)
Provider:  Alain Honey, MD  Careteam: Patient Care Team: Mast, Man X, NP as PCP - General (Internal Medicine) Fay Records, MD as PCP - Cardiology (Cardiology)  PLACE OF SERVICE:  [place] Directive information    No Known Allergies  Chief Complaint  Patient presents with   Medical Management of Chronic Issues    Patient presents today for a 8 month follow-up.     HPI: Patient is a 88 y.o. female patient is here to follow-up chronic medical problems including hyperlipidemia, hypertension, hypothyroidism, and hormone replacement therapy.  She remains very active.  She has no complaints or symptoms today Blood pressures have been well controlled on combination hydrochlorothiazide and losartan.  She is status post surgery for spinal stenosis and doing well from that surgery as well  Review of Systems:  Review of Systems  Constitutional: Negative.   Respiratory: Negative.    Cardiovascular: Negative.   Gastrointestinal: Negative.   Genitourinary: Negative.   Musculoskeletal: Negative.   All other systems reviewed and are negative.   Past Medical History:  Diagnosis Date   Anemia 11/2015   Arthritis    Blood transfusion 2017 JULY 22   Carotid stenosis    Dopplers 0/16: RICA 55-37%, LICA 4-82%   Cataract    REMOVED   Chronic headaches    HX MIGRAINES   Collagenous colitis    Diverticulosis 2013   Colonoscopy   Gastric ulcer 11/2015   Glaucoma    BOTH EYES   Hiatal hernia    History of spinal stenosis    History of uterine fibroid    Hx of cardiac catheterization    LHC 3/14: no angiographic CAD, EF 65%   Hx of echocardiogram    Echocardiogram 06/21/12: Mild LVH, EF 70-78%, grade 2 diastolic dysfunction, mild MR   Hyperlipidemia    Hypertension    Hypothyroidism    Internal hemorrhoids 2009   Colonoscopy   Migraines    UTI (urinary tract infection) HX OF   Past Surgical History:  Procedure Laterality Date   BUNIONECTOMY  YRS AGO LEFT    RIGHT [facility] AT [facility] 3 YRS AGO   BUNIONECTOMY Left    CATARACT EXTRACTION W/ INTRAOCULAR LENS IMPLANT  03/2010   Left   CATARACT EXTRACTION W/ INTRAOCULAR LENS IMPLANT Bilateral 11/2010   COLONOSCOPY  2013   NORMAL    ESOPHAGOGASTRODUODENOSCOPY N/A 11/23/2015   Procedure: ESOPHAGOGASTRODUODENOSCOPY (EGD);  Surgeon: Rogene Houston, MD;  [facility];  Service: Endoscopy;  Laterality: N/A;   EUS N/A 12/19/2015   Procedure: ESOPHAGEAL ENDOSCOPIC ULTRASOUND (EUS) RADIAL;  Surgeon: Milus Banister, MD;  [facility];  Service: Endoscopy;  Laterality: N/A;   FUNCTIONAL ENDOSCOPIC SINUS SURGERY     HAMMER TOE SURGERY     HAND SURGERY Right 1998   INGUINAL HERNIA REPAIR Left 03/11/2016   Procedure: LEFT INGUINAL HERINA REPAIR WITH MESH;  Surgeon: Donnie Mesa, MD;  Location: [place];  Service: General;  Laterality: Left;   INSERTION OF MESH Left 03/11/2016   Procedure: INSERTION OF MESH;  Surgeon: Donnie Mesa, MD;  Location: [place];  Service: General;  Laterality: Left;   IR RADIOLOGIST EVAL & MGMT  07/01/2017   LAMINECTOMY  03/2008   LAPAROTOMY N/A 11/11/2017   Procedure: MINI LAPAROTOMY;  Surgeon: Everitt Amber, MD;  [facility];  Service: Gynecology;  Laterality: N/A;   LUMBAR LAMINECTOMY/DECOMPRESSION MICRODISCECTOMY N/A 12/23/2020   Procedure: Laminectomy and Foraminotomy - Lumbar one-two;  Surgeon: Eustace Moore, MD;  Location: [place];  Service: Neurosurgery;  Laterality: N/A;   POLYPECTOMY     REVERSE SHOULDER ARTHROPLASTY Right 10/13/2018   Procedure: REVERSE SHOULDER ARTHROPLASTY;  Surgeon: Justice Britain, MD;  [facility];  Service: Orthopedics;  Laterality: Right;  122mn   ROBOTIC ASSISTED TOTAL HYSTERECTOMY WITH BILATERAL SALPINGO OOPHERECTOMY Bilateral 11/11/2017   Procedure: XI ROBOTIC ASSISTED TOTAL HYSTERECTOMY WITH BILATERAL SALPINGO OOPHORECTOMY FOR UTERUS GREATER THAN 250 GRAMS;  Surgeon: REveritt Amber MD;  [facility];  Service: Gynecology;   Laterality: Bilateral;   TONSILLECTOMY     TOTAL HIP ARTHROPLASTY  2008   Right   Social History:   reports that she has never smoked. She has never used smokeless tobacco. She reports current alcohol use. She reports that she does not use drugs.  Family History  Problem Relation Age of Onset   Colon cancer Brother [phone]  Anuerysm Father [phone]   Medications: Patient's Medications  New Prescriptions   No medications on file  Previous Medications   ATORVASTATIN (LIPITOR) 10 MG TABLET    Take 0.5 tablets (5 mg total) by mouth at bedtime.   CHOLECALCIFEROL (VITAMIN D3 SUPER STRENGTH) 50 MCG (2000 UT) TABS    Take 2,000 Units by mouth daily.   CLOBETASOL OINTMENT (TEMOVATE) 0.05 %    Apply 1 application topically daily as needed (vaginitis).   CONJUGATED ESTROGENS (PREMARIN) VAGINAL CREAM    Place 1 Applicatorful vaginally daily.   ESTRADIOL (ESTRACE) 0.5 MG TABLET    TAKE 1 TABLET BY MOUTH AT BEDTIME   HYDROCHLOROTHIAZIDE (HYDRODIURIL) 12.5 MG TABLET    TAKE 1 TABLET BY MOUTH EVERY DAY   LATANOPROST (XALATAN) 0.005 % OPHTHALMIC SOLUTION    Place 1 drop into both eyes at bedtime.   LEVOTHYROXINE (SYNTHROID) 125 MCG TABLET    TAKE 1 TABLET BY MOUTH EVERY DAY   LOSARTAN (COZAAR) 100 MG TABLET    TAKE 1 TABLET BY MOUTH EVERY DAY   MULTIPLE VITAMIN (MULTIVITAMIN) TABLET    Take 1 tablet by mouth daily with lunch.   TURMERIC (QC TUMERIC COMPLEX PO)    Take 1,500 Units by mouth 2 (two) times daily.   VITAMIN C (ASCORBIC ACID) 500 MG TABLET    Take 500 mg by mouth daily with lunch.  Modified Medications   No medications on file  Discontinued Medications   GABAPENTIN (NEURONTIN) 100 MG CAPSULE    Take 100 mg by mouth at bedtime.    Physical Exam:  Vitals:   10/22/21 1403  BP: 112/68  Pulse: 80  Temp: (!) 97.5 F (36.4 C)  SpO2: 96%  Weight: 130 lb (59 kg)  Height: '5\' 1"'$  (1.549 m)   Body mass index is 24.56 kg/m. Wt Readings from Last 3 Encounters:  10/22/21 130 lb (59 kg)   02/19/21 122 lb 12.8 oz (55.7 kg)  12/23/20 123 lb (55.8 kg)    Physical Exam Vitals and nursing note reviewed.  Constitutional:      Appearance: Normal appearance.  Cardiovascular:     Rate and Rhythm: Normal rate and regular rhythm.     Heart sounds: Murmur heard.  Pulmonary:     Effort: Pulmonary effort is normal.     Breath sounds: Normal breath sounds.  Musculoskeletal:        General: Normal range of motion.  Neurological:     General: No focal deficit present.     Mental Status: She is alert and oriented to person,  place, and time.     Labs reviewed: Basic Metabolic Panel: Recent Labs    12/12/20 1104 02/13/21 0715  NA 135 139  K 3.6 4.0  CL 98 102  CO2 31 31  GLUCOSE 95 81  BUN 15 16  CREATININE 0.68 0.57*  CALCIUM 9.0 9.0  TSH  --  2.02   Liver Function Tests: Recent Labs    02/13/21 0715  AST 23  ALT 15  BILITOT 0.7  PROT 6.4   No results for input(s): "LIPASE", "AMYLASE" in the last 8760 hours. No results for input(s): "AMMONIA" in the last 8760 hours. CBC: Recent Labs    12/12/20 1104 02/13/21 0715  WBC 7.0 5.9  NEUTROABS 4.2 3,239  HGB 13.0 12.2  HCT 39.2 36.5  MCV 92.0 91.7  PLT 262 266   Lipid Panel: Recent Labs    02/20/21 0948  CHOL 161  HDL 58  LDLCALC 87  TRIG 70  CHOLHDL 2.8   TSH: Recent Labs    02/13/21 0715  TSH 2.02   A1C: No results found for: "HGBA1C"   Assessment/Plan  1. Hyperlipidemia, unspecified hyperlipidemia type Patient continues on low-dose atorvastatin.  Last LDL was 87, 8 months ago  2. Hyponatremia We will repeat CMP with labs before next visit to assess electrolytes and liver functions  3. Hpothyroidism, unspecified type Continues on levothyroxine 125 mcg/day  4. Primary hypertension Well-controlled on losartan 100 mg and hydrochlorothiazide 12.5.  I did recommend taking losartan at bedtime with her other medications for better coverage of blood pressure in the early morning hours  when statistically heart attacks and strokes occur   Alain Honey, MD [facility] [phone]

## 2021-10-27 DIAGNOSIS — Z124 Encounter for screening for malignant neoplasm of cervix: Secondary | ICD-10-CM | POA: Diagnosis not present

## 2021-10-27 DIAGNOSIS — Z01419 Encounter for gynecological examination (general) (routine) without abnormal findings: Secondary | ICD-10-CM | POA: Diagnosis not present

## 2021-10-27 DIAGNOSIS — Z01411 Encounter for gynecological examination (general) (routine) with abnormal findings: Secondary | ICD-10-CM | POA: Diagnosis not present

## 2021-10-27 DIAGNOSIS — Z6824 Body mass index (BMI) 24.0-24.9, adult: Secondary | ICD-10-CM | POA: Diagnosis not present

## 2021-10-27 DIAGNOSIS — N816 Rectocele: Secondary | ICD-10-CM | POA: Diagnosis not present

## 2021-10-27 DIAGNOSIS — Z0142 Encounter for cervical smear to confirm findings of recent normal smear following initial abnormal smear: Secondary | ICD-10-CM | POA: Diagnosis not present

## 2021-10-30 ENCOUNTER — Encounter: Payer: BLUE CROSS/BLUE SHIELD | Admitting: Nurse Practitioner

## 2021-12-04 ENCOUNTER — Non-Acute Institutional Stay: Payer: Medicare Other | Admitting: Nurse Practitioner

## 2021-12-04 ENCOUNTER — Encounter: Payer: Self-pay | Admitting: Nurse Practitioner

## 2021-12-04 DIAGNOSIS — I10 Essential (primary) hypertension: Secondary | ICD-10-CM

## 2021-12-04 DIAGNOSIS — E871 Hypo-osmolality and hyponatremia: Secondary | ICD-10-CM

## 2021-12-04 DIAGNOSIS — E039 Hypothyroidism, unspecified: Secondary | ICD-10-CM

## 2021-12-04 DIAGNOSIS — M79602 Pain in left arm: Secondary | ICD-10-CM | POA: Diagnosis not present

## 2021-12-04 DIAGNOSIS — D509 Iron deficiency anemia, unspecified: Secondary | ICD-10-CM | POA: Diagnosis not present

## 2021-12-04 DIAGNOSIS — E559 Vitamin D deficiency, unspecified: Secondary | ICD-10-CM | POA: Diagnosis not present

## 2021-12-04 DIAGNOSIS — N952 Postmenopausal atrophic vaginitis: Secondary | ICD-10-CM | POA: Diagnosis not present

## 2021-12-04 DIAGNOSIS — E785 Hyperlipidemia, unspecified: Secondary | ICD-10-CM

## 2021-12-04 NOTE — Assessment & Plan Note (Addendum)
left neck/arm/scapula pain for a week or 2, constant, no decreased ROM of neck, shoulder, or arm. No injury.Marland Kitchen denied chest pain, palpitation, or SOB. Last EKG 12/12/20. Obtain EKG, X-ray cervical spine, left shoulder, left scapula. Therapy referral.  EKG no significant change comparing to EKG 12/12/20 She has Prn Tramadol at home Therapy to eval and tx.

## 2021-12-04 NOTE — Assessment & Plan Note (Signed)
takes Levothyroxine, TSH 2.02 02/13/21

## 2021-12-04 NOTE — Assessment & Plan Note (Signed)
Hx of UGI/PUD, f/u GI 10/26/20, off acid reducers. Hgb 12.2 02/13/21

## 2021-12-04 NOTE — Assessment & Plan Note (Signed)
takes Vit D, Vit D level 85 08/29/19

## 2021-12-04 NOTE — Assessment & Plan Note (Signed)
takes Estrogen vaginal cream, Estradiol tab

## 2021-12-04 NOTE — Assessment & Plan Note (Signed)
Blood pressure is controlled,  takes Losartan, HCTZ, Bun/creat 16/0.57 02/13/21

## 2021-12-04 NOTE — Assessment & Plan Note (Signed)
Na 139 02/13/21

## 2021-12-04 NOTE — Assessment & Plan Note (Signed)
takes Atorvastatin, LDL 87 02/20/21

## 2021-12-04 NOTE — Progress Notes (Signed)
Location:   [place] Room Number: townhome 914B/[id number] Place of Service:  Home (12) Provider: Marlana Latus NP  Code Status: DNR Goals of Care:     12/04/2021    9:26 AM  Advanced Directives  Does Patient Have a Medical Advance Directive? No  Would patient like information on creating a medical advance directive? No - Patient declined     Chief Complaint  Patient presents with   Acute Visit    Patient is being seen for left arm and scapula pain    HPI: Patient is a 88 y.o. female seen today for left neck/arm/scapula pain for a week or 2, constant, no decreased ROM of neck, shoulder, or arm. No injury.Marland Kitchen denied chest pain, palpitation, or SOB. Last EKG 12/12/20.   HTN, takes Losartan, HCTZ, Bun/creat 16/0.57 02/13/21             OA, left arm, left scapula pain.              Hypothyroidism, takes Levothyroxine, TSH 2.02 02/13/21             Atrophic vaginitis, takes Estrogen vaginal cream, Estradiol tab             Vit D deficiency, takes Vit D, Vit D level 85 08/29/19             Hyperlipidemia, takes Atorvastatin, LDL 87 02/20/21             Hyponatremia, Na 139 02/13/21             Hx of UGI/PUD, f/u GI 10/26/20, off acid reducers. Hgb 12.2 02/13/21  Past Medical History:  Diagnosis Date   Anemia 11/2015   Arthritis    Blood transfusion 2017 JULY 22   Carotid stenosis    Dopplers 6/31: RICA 49-70%, LICA 2-63%   Cataract    REMOVED   Chronic headaches    HX MIGRAINES   Collagenous colitis    Diverticulosis 2013   Colonoscopy   Gastric ulcer 11/2015   Glaucoma    BOTH EYES   Hiatal hernia    History of spinal stenosis    History of uterine fibroid    Hx of cardiac catheterization    LHC 3/14: no angiographic CAD, EF 65%   Hx of echocardiogram    Echocardiogram 06/21/12: Mild LVH, EF 78-58%, grade 2 diastolic dysfunction, mild MR   Hyperlipidemia    Hypertension    Hypothyroidism    Internal hemorrhoids 2009   Colonoscopy   Migraines    UTI (urinary  tract infection) HX OF    Past Surgical History:  Procedure Laterality Date   BUNIONECTOMY  YRS AGO LEFT   RIGHT [facility] AT [facility] 3 YRS AGO   BUNIONECTOMY Left    CATARACT EXTRACTION W/ INTRAOCULAR LENS IMPLANT  03/2010   Left   CATARACT EXTRACTION W/ INTRAOCULAR LENS IMPLANT Bilateral 11/2010   COLONOSCOPY  2013   NORMAL    ESOPHAGOGASTRODUODENOSCOPY N/A 11/23/2015   Procedure: ESOPHAGOGASTRODUODENOSCOPY (EGD);  Surgeon: Rogene Houston, MD;  [facility];  Service: Endoscopy;  Laterality: N/A;   EUS N/A 12/19/2015   Procedure: ESOPHAGEAL ENDOSCOPIC ULTRASOUND (EUS) RADIAL;  Surgeon: Milus Banister, MD;  [facility];  Service: Endoscopy;  Laterality: N/A;   FUNCTIONAL ENDOSCOPIC SINUS SURGERY     HAMMER TOE SURGERY     HAND SURGERY Right 1998   INGUINAL HERNIA REPAIR Left 03/11/2016   Procedure: LEFT INGUINAL HERINA REPAIR WITH  MESH;  Surgeon: Donnie Mesa, MD;  Location: [place];  Service: General;  Laterality: Left;   INSERTION OF MESH Left 03/11/2016   Procedure: INSERTION OF MESH;  Surgeon: Donnie Mesa, MD;  Location: [place];  Service: General;  Laterality: Left;   IR RADIOLOGIST EVAL & MGMT  07/01/2017   LAMINECTOMY  03/2008   LAPAROTOMY N/A 11/11/2017   Procedure: MINI LAPAROTOMY;  Surgeon: Everitt Amber, MD;  [facility];  Service: Gynecology;  Laterality: N/A;   LUMBAR LAMINECTOMY/DECOMPRESSION MICRODISCECTOMY N/A 12/23/2020   Procedure: Laminectomy and Foraminotomy - Lumbar one-two;  Surgeon: Eustace Moore, MD;  Location: [place];  Service: Neurosurgery;  Laterality: N/A;   POLYPECTOMY     REVERSE SHOULDER ARTHROPLASTY Right 10/13/2018   Procedure: REVERSE SHOULDER ARTHROPLASTY;  Surgeon: Justice Britain, MD;  [facility];  Service: Orthopedics;  Laterality: Right;  181mn   ROBOTIC ASSISTED TOTAL HYSTERECTOMY WITH BILATERAL SALPINGO OOPHERECTOMY Bilateral 11/11/2017   Procedure: XI ROBOTIC ASSISTED TOTAL HYSTERECTOMY WITH BILATERAL SALPINGO OOPHORECTOMY FOR  UTERUS GREATER THAN 250 GRAMS;  Surgeon: REveritt Amber MD;  [facility];  Service: Gynecology;  Laterality: Bilateral;   TONSILLECTOMY     TOTAL HIP ARTHROPLASTY  2008   Right    No Known Allergies  Allergies as of 12/04/2021   No Known Allergies      Medication List        Accurate as of December 04, 2021 11:59 PM. If you have any questions, ask your nurse or doctor.          ascorbic acid 500 MG tablet Commonly known as: VITAMIN C Take 500 mg by mouth daily with lunch.   atorvastatin 10 MG tablet Commonly known as: LIPITOR Take 0.5 tablets (5 mg total) by mouth at bedtime.   clobetasol ointment 0.05 % Commonly known as: TEMOVATE Apply 1 application topically daily as needed (vaginitis).   conjugated estrogens 0.625 MG/GM vaginal cream Commonly known as: PREMARIN Place 1 Applicatorful vaginally daily.   estradiol 0.5 MG tablet Commonly known as: ESTRACE TAKE 1 TABLET BY MOUTH AT BEDTIME   hydrochlorothiazide 12.5 MG tablet Commonly known as: HYDRODIURIL TAKE 1 TABLET BY MOUTH EVERY DAY   latanoprost 0.005 % ophthalmic solution Commonly known as: XALATAN Place 1 drop into both eyes at bedtime.   levothyroxine 125 MCG tablet Commonly known as: SYNTHROID TAKE 1 TABLET BY MOUTH EVERY DAY   losartan 100 MG tablet Commonly known as: COZAAR TAKE 1 TABLET BY MOUTH EVERY DAY   multivitamin tablet Take 1 tablet by mouth daily with lunch.   QC TUMERIC COMPLEX PO Take 1,500 Units by mouth 2 (two) times daily.   Vitamin D3 Super Strength 50 MCG (2000 UT) Tabs Generic drug: Cholecalciferol Take 2,000 Units by mouth daily.        Review of Systems:  Review of Systems  Constitutional:  Negative for fatigue, fever and unexpected weight change.  HENT:  Positive for hearing loss. Negative for congestion and voice change.   Eyes:  Negative for visual disturbance.  Respiratory:  Negative for cough and shortness of breath.   Cardiovascular:  Negative for leg  swelling.  Gastrointestinal:  Negative for abdominal pain and constipation.       Left inguinal hernia repair2017  Genitourinary:  Negative for dysuria, frequency, urgency and vaginal discharge.       1-2x/night  Musculoskeletal:  Positive for arthralgias, back pain and neck pain. Negative for joint swelling.       S/p R  shoulder replacement 2020. Lower back, right side sciatica, pain most at night and in am. Left neck/shoulder/scapula/arm pain, constant, no change of ROM, no swelling  neck, shoulder, elbow, wrist, fingers.   Skin:  Negative for color change.  Neurological:  Negative for speech difficulty, light-headedness and headaches.  Psychiatric/Behavioral:  Negative for behavioral problems and sleep disturbance. The patient is not nervous/anxious.     Health Maintenance  Topic Date Due   Zoster Vaccines- Shingrix (1 of 2) Never done   COVID-19 Vaccine (4 - Booster for Pfizer series) 06/06/2020   INFLUENZA VACCINE  12/02/2021   TETANUS/TDAP  06/13/2030   Pneumonia Vaccine 78+ Years old  Completed   DEXA SCAN  Completed   HPV VACCINES  Aged Out    Physical Exam: Vitals:   12/04/21 1519  BP: (!) 140/80  Pulse: 72  Resp: 16  Temp: (!) 97.3 F (36.3 C)  TempSrc: Temporal  SpO2: 99%  Weight: 128 lb 9.6 oz (58.3 kg)  Height: '5\' 1"'$  (1.549 m)   Body mass index is 24.3 kg/m. Physical Exam Vitals reviewed.  Constitutional:      Appearance: Normal appearance.  HENT:     Head: Normocephalic and atraumatic.     Mouth/Throat:     Mouth: Mucous membranes are moist.  Eyes:     Extraocular Movements: Extraocular movements intact.     Conjunctiva/sclera: Conjunctivae normal.     Pupils: Pupils are equal, round, and reactive to light.     Comments: S/p R+L cataract extraction  Cardiovascular:     Rate and Rhythm: Normal rate and regular rhythm.     Heart sounds: No murmur heard. Pulmonary:     Effort: Pulmonary effort is normal.     Breath sounds: No wheezing or rales.   Abdominal:     General: Bowel sounds are normal.     Palpations: Abdomen is soft.     Tenderness: There is no abdominal tenderness.     Comments: S/p left inguinal hernia repair  Musculoskeletal:        General: Normal range of motion.     Cervical back: Normal range of motion and neck supple.     Right lower leg: No edema.     Left lower leg: No edema.     Comments: S/p R shoulder replacement.   Skin:    General: Skin is warm and dry.     Comments: Surgical scar left inguinal hernia repair.   Neurological:     General: No focal deficit present.     Mental Status: She is alert and oriented to person, place, and time. Mental status is at baseline.     Motor: No weakness.     Coordination: Coordination normal.  Psychiatric:        Mood and Affect: Mood normal.        Behavior: Behavior normal.        Thought Content: Thought content normal.        Judgment: Judgment normal.     Labs reviewed: Basic Metabolic Panel: Recent Labs    12/12/20 1104 02/13/21 0715  NA 135 139  K 3.6 4.0  CL 98 102  CO2 31 31  GLUCOSE 95 81  BUN 15 16  CREATININE 0.68 0.57*  CALCIUM 9.0 9.0  TSH  --  2.02   Liver Function Tests: Recent Labs    02/13/21 0715  AST 23  ALT 15  BILITOT 0.7  PROT 6.4   No results for input(s): "  LIPASE", "AMYLASE" in the last 8760 hours. No results for input(s): "AMMONIA" in the last 8760 hours. CBC: Recent Labs    12/12/20 1104 02/13/21 0715  WBC 7.0 5.9  NEUTROABS 4.2 3,239  HGB 13.0 12.2  HCT 39.2 36.5  MCV 92.0 91.7  PLT 262 266   Lipid Panel: Recent Labs    02/20/21 0948  CHOL 161  HDL 58  LDLCALC 87  TRIG 70  CHOLHDL 2.8   No results found for: "HGBA1C"  Procedures since last visit: No results found.  Assessment/Plan  Left arm pain  left neck/arm/scapula pain for a week or 2, constant, no decreased ROM of neck, shoulder, or arm. No injury.Marland Kitchen denied chest pain, palpitation, or SOB. Last EKG 12/12/20. Obtain EKG, X-ray cervical  spine, left shoulder, left scapula. Therapy referral.  EKG no significant change comparing to EKG 12/12/20 She has Prn Tramadol at home Therapy to eval and tx.   HTN (hypertension) Blood pressure is controlled,  takes Losartan, HCTZ, Bun/creat 16/0.57 02/13/21  Hypothyroidism  takes Levothyroxine, TSH 2.02 02/13/21  Atrophic vaginitis takes Estrogen vaginal cream, Estradiol tab  Vitamin D deficiency  takes Vit D, Vit D level 85 08/29/19  Hyperlipemia takes Atorvastatin, LDL 87 02/20/21  Hyponatremia Na 139 02/13/21  Iron deficiency anemia, unspecified   Hx of UGI/PUD, f/u GI 10/26/20, off acid reducers. Hgb 12.2 02/13/21   Labs/tests ordered:  EKG, X-ray cervical spine, left shoulder, left scapula.   Next appt:  2 weeks.

## 2021-12-05 ENCOUNTER — Ambulatory Visit
Admission: RE | Admit: 2021-12-05 | Discharge: 2021-12-05 | Disposition: A | Discharge status: Home / Self Care | Payer: Medicare Other | Source: Ambulatory Visit | Attending: Nurse Practitioner | Admitting: Nurse Practitioner

## 2021-12-05 ENCOUNTER — Encounter: Payer: Self-pay | Admitting: Nurse Practitioner

## 2021-12-05 DIAGNOSIS — M542 Cervicalgia: Secondary | ICD-10-CM | POA: Diagnosis not present

## 2021-12-05 DIAGNOSIS — M79602 Pain in left arm: Secondary | ICD-10-CM

## 2021-12-05 DIAGNOSIS — M25512 Pain in left shoulder: Secondary | ICD-10-CM | POA: Diagnosis not present

## 2021-12-05 DIAGNOSIS — M4312 Spondylolisthesis, cervical region: Secondary | ICD-10-CM | POA: Diagnosis not present

## 2021-12-09 ENCOUNTER — Encounter: Payer: Self-pay | Admitting: Internal Medicine

## 2021-12-09 ENCOUNTER — Ambulatory Visit (INDEPENDENT_AMBULATORY_CARE_PROVIDER_SITE_OTHER): Payer: Medicare Other | Admitting: Internal Medicine

## 2021-12-09 VITALS — BP 170/90 | HR 71 | Ht 61.0 in | Wt 126.6 lb

## 2021-12-09 DIAGNOSIS — I10 Essential (primary) hypertension: Secondary | ICD-10-CM

## 2021-12-09 NOTE — Progress Notes (Signed)
Cardiology Office Note   Date:  12/09/2021   ID:  Carol Wells, DOB 1931-06-16, MRN [id number]  PCP:  Mast, Man X, NP  Cardiologist:   Dorris Carnes, MD   Patient presents for follow up of HTN and dyspnea    History of Present Illness: Carol Wells is a 88 y.o. female with a history of 88 yo  HTN, dyspnea.  Echo in 2014 showed NSR  Gr II diastolic dysfunction   Stress echo abnormal, sugg of ischemia.  L heart cath in Feb 2014 showed no CAD  LVEF normal.  Also has atherosclerosis of aorta on CT scan    The pt says her BP is higher in the doctor's office        The pt says 2 wks ago she had L arm pain develop   Radiating to scapula   WOrse when she is laying on L side   CRay of neck showed some cervical arthritis   Patient notes occasional L chest pain when arm/scapula really hurting   Not at other times   [facility] denies SOB  unless she is going for a walk and trying to have a conversation.    No CP with activities around house    Pt lives in a townhouse   Able to do chores without problems      Current Meds  Medication Sig   atorvastatin (LIPITOR) 10 MG tablet Take 0.5 tablets (5 mg total) by mouth at bedtime.   Cholecalciferol (VITAMIN D3 SUPER STRENGTH) 50 MCG (2000 UT) TABS Take 2,000 Units by mouth daily.   clobetasol ointment (TEMOVATE) 9.76 % Apply 1 application topically daily as needed (vaginitis).   estradiol (ESTRACE) 0.5 MG tablet TAKE 1 TABLET BY MOUTH AT BEDTIME   hydrochlorothiazide (HYDRODIURIL) 12.5 MG tablet TAKE 1 TABLET BY MOUTH EVERY DAY   latanoprost (XALATAN) 0.005 % ophthalmic solution Place 1 drop into both eyes at bedtime.   levothyroxine (SYNTHROID) 125 MCG tablet TAKE 1 TABLET BY MOUTH EVERY DAY   losartan (COZAAR) 100 MG tablet TAKE 1 TABLET BY MOUTH EVERY DAY   Multiple Vitamin (MULTIVITAMIN) tablet Take 1 tablet by mouth daily with lunch.   Turmeric (QC TUMERIC COMPLEX PO) Take 1,500 Units by mouth 2 (two) times daily.   vitamin C (ASCORBIC ACID) 500 MG  tablet Take 500 mg by mouth daily with lunch.     Allergies:   Patient has no known allergies.   Past Medical History:  Diagnosis Date   Anemia 11/2015   Arthritis    Blood transfusion 2017 JULY 22   Carotid stenosis    Dopplers 7/34: RICA 19-37%, LICA 9-02%   Cataract    REMOVED   Chronic headaches    HX MIGRAINES   Collagenous colitis    Diverticulosis 2013   Colonoscopy   Gastric ulcer 11/2015   Glaucoma    BOTH EYES   Hiatal hernia    History of spinal stenosis    History of uterine fibroid    Hx of cardiac catheterization    LHC 3/14: no angiographic CAD, EF 65%   Hx of echocardiogram    Echocardiogram 06/21/12: Mild LVH, EF 40-97%, grade 2 diastolic dysfunction, mild MR   Hyperlipidemia    Hypertension    Hypothyroidism    Internal hemorrhoids 2009   Colonoscopy   Migraines    UTI (urinary tract infection) HX OF    Past Surgical History:  Procedure Laterality Date   BUNIONECTOMY  YRS  AGO LEFT   RIGHT [facility] AT [facility] 3 YRS AGO   BUNIONECTOMY Left    CATARACT EXTRACTION W/ INTRAOCULAR LENS IMPLANT  03/2010   Left   CATARACT EXTRACTION W/ INTRAOCULAR LENS IMPLANT Bilateral 11/2010   COLONOSCOPY  2013   NORMAL    ESOPHAGOGASTRODUODENOSCOPY N/A 11/23/2015   Procedure: ESOPHAGOGASTRODUODENOSCOPY (EGD);  Surgeon: Rogene [place], MD;  [facility];  Service: Endoscopy;  Laterality: N/A;   EUS N/A 12/19/2015   Procedure: ESOPHAGEAL ENDOSCOPIC ULTRASOUND (EUS) RADIAL;  Surgeon: Milus Banister, MD;  [facility];  Service: Endoscopy;  Laterality: N/A;   FUNCTIONAL ENDOSCOPIC SINUS SURGERY     HAMMER TOE SURGERY     HAND SURGERY Right 1998   INGUINAL HERNIA REPAIR Left 03/11/2016   Procedure: LEFT INGUINAL HERINA REPAIR WITH MESH;  Surgeon: Donnie Mesa, MD;  Location: [facility];  Service: General;  Laterality: Left;   INSERTION OF MESH Left 03/11/2016   Procedure: INSERTION OF MESH;  Surgeon: Donnie Mesa, MD;  Location: [place];  Service: General;   Laterality: Left;   IR RADIOLOGIST EVAL & MGMT  07/01/2017   LAMINECTOMY  03/2008   LAPAROTOMY N/A 11/11/2017   Procedure: MINI LAPAROTOMY;  Surgeon: Everitt Amber, MD;  [facility];  Service: Gynecology;  Laterality: N/A;   LUMBAR LAMINECTOMY/DECOMPRESSION MICRODISCECTOMY N/A 12/23/2020   Procedure: Laminectomy and Foraminotomy - Lumbar one-two;  Surgeon: Eustace [place], MD;  Location: [facility];  Service: Neurosurgery;  Laterality: N/A;   POLYPECTOMY     REVERSE SHOULDER ARTHROPLASTY Right 10/13/2018   Procedure: REVERSE SHOULDER ARTHROPLASTY;  Surgeon: Justice Britain, MD;  [facility];  Service: Orthopedics;  Laterality: Right;  162mn   ROBOTIC ASSISTED TOTAL HYSTERECTOMY WITH BILATERAL SALPINGO OOPHERECTOMY Bilateral 11/11/2017   Procedure: XI ROBOTIC ASSISTED TOTAL HYSTERECTOMY WITH BILATERAL SALPINGO OOPHORECTOMY FOR UTERUS GREATER THAN 250 GRAMS;  Surgeon: REveritt Amber MD;  [facility];  Service: Gynecology;  Laterality: Bilateral;   TONSILLECTOMY     TOTAL HIP ARTHROPLASTY  2008   Right     Social History:  The patient  reports that she has never smoked. She has never used smokeless tobacco. She reports current alcohol use. She reports that she does not use drugs.   Family History:  The patient's family history includes Anuerysm (age of onset: 840 in her father; Colon cancer (age of onset: 526 in her brother.    ROS:  Please see the history of present illness. All other systems are reviewed and  Negative to the above problem except as noted.    PHYSICAL EXAM: VS:  BP (!) 150/86   Pulse 71   Ht '5\' 1"'$  (1.549 m)   Wt 126 lb 9.6 oz (57.4 kg)   SpO2 97%   BMI 23.92 kg/m   GEN: Well nourished, well developed, in no acute distress  HEENT: normal  Neck: no JVD, carotid bruits Cardiac: RRR; no murmur   No LE edema  Respiratory:  clear to auscultation bilaterally, normal work of breathing GI: soft, nontender, nondistended, + BS  No hepatomegaly  MS: no deformity Moving all  extremities   Skin: warm and dry, no rash Neuro:  Strength and sensation are intact Psych: euthymic mood, full affect   EKG:  EKG is ordered today.  SR 71 bpm     Lipid Panel    Component Value Date/Time   CHOL 161 02/20/2021 0948   TRIG 70 02/20/2021 0948   HDL 58 02/20/2021 0948  CHOLHDL 2.8 02/20/2021 0948   VLDL 12.0 10/06/2012 0939   LDLCALC 87 02/20/2021 0948      Wt Readings from Last 3 Encounters:  12/09/21 126 lb 9.6 oz (57.4 kg)  12/04/21 128 lb 9.6 oz (58.3 kg)  10/22/21 130 lb (59 kg)      ASSESSMENT AND PLAN:  1  L arm/back/chest pain   I think this is related to neck  I do not think it represents angina   Follow     2  HTN  BP is high on 2 check   She says it is ually better    She lives at [facility] [place]    She needs to get the cuff checked there    May need to change Rx  She said she would check this week   3  Atherosclerosiss    With plaquing of the aorta LDL should be lower   I would recomm increasing to 10 mg per day   She will reflect on this     Current medicines are reviewed at length with the patient today.  The patient does not have concerns regarding medicines.  Signed, Dorris Carnes, MD  12/09/2021 4:00 PM    [facility] [place], [place], [place]  [address] Phone: [phone]; Fax: [phone]

## 2021-12-09 NOTE — Patient Instructions (Signed)
Medication Instructions:  ? ?*If you need a refill on your cardiac medications before your next appointment, please call your pharmacy* ? ? ?Lab Work: ? ?If you have labs (blood work) drawn today and your tests are completely normal, you will receive your results only by: ?MyChart Message (if you have MyChart) OR ?A paper copy in the mail ?If you have any lab test that is abnormal or we need to change your treatment, we will call you to review the results. ? ? ?Testing/Procedures: ? ? ? ?Follow-Up: ?At [facility], you and your health needs are our priority.  As part of our continuing mission to provide you with exceptional heart care, we have created designated Provider Care Teams.  These Care Teams include your primary Cardiologist (physician) and Advanced Practice Providers (APPs -  Physician Assistants and Nurse Practitioners) who all work together to provide you with the care you need, when you need it. ? ?We recommend signing up for the patient portal called "MyChart".  Sign up information is provided on this After Visit Summary.  MyChart is used to connect with patients for Virtual Visits (Telemedicine).  Patients are able to view lab/test results, encounter notes, upcoming appointments, etc.  Non-urgent messages can be sent to your provider as well.   ?To learn more about what you can do with MyChart, go to [email or web address].   ? ?Your next appointment:   ?1 year(s) ? ?The format for your next appointment:   ?In Person ? ?Provider:   ?Paula Ross, MD   ? ? ?Other Instructions ? ? ?Important Information About Sugar ? ? ? ? ?  ?

## 2021-12-17 ENCOUNTER — Encounter: Payer: Self-pay | Admitting: Nurse Practitioner

## 2021-12-18 ENCOUNTER — Non-Acute Institutional Stay: Payer: Medicare Other | Admitting: Nurse Practitioner

## 2021-12-18 ENCOUNTER — Encounter: Payer: Self-pay | Admitting: Nurse Practitioner

## 2021-12-18 ENCOUNTER — Encounter: Payer: Medicare Other | Admitting: Nurse Practitioner

## 2021-12-18 DIAGNOSIS — E559 Vitamin D deficiency, unspecified: Secondary | ICD-10-CM

## 2021-12-18 DIAGNOSIS — M5030 Other cervical disc degeneration, unspecified cervical region: Secondary | ICD-10-CM | POA: Diagnosis not present

## 2021-12-18 DIAGNOSIS — E785 Hyperlipidemia, unspecified: Secondary | ICD-10-CM | POA: Diagnosis not present

## 2021-12-18 DIAGNOSIS — N952 Postmenopausal atrophic vaginitis: Secondary | ICD-10-CM | POA: Diagnosis not present

## 2021-12-18 DIAGNOSIS — I10 Essential (primary) hypertension: Secondary | ICD-10-CM

## 2021-12-18 DIAGNOSIS — E039 Hypothyroidism, unspecified: Secondary | ICD-10-CM

## 2021-12-18 MED ORDER — ATORVASTATIN CALCIUM 10 MG PO TABS: 10.0000 mg | ORAL_TABLET | Freq: Every day | ORAL | 3 refills | Status: DC

## 2021-12-18 NOTE — Assessment & Plan Note (Addendum)
DDD cervical spine per X-ray 12/05/21, c/o left neck/arm/scapula pain for a week or 2, constant, no decreased ROM of neck, shoulder, or arm. No injury.Marland Kitchen denied chest pain, palpitation, or SOB. Last EKG 12/12/20. Cardiology 12/09/21 did not think it represents angina, think it is related to neck,  working with thearpy. Suggested Neurosurgeon 01/01/22

## 2021-12-18 NOTE — Assessment & Plan Note (Addendum)
takes Losartan, HCTZ, Bun/creat 16/0.57 02/13/21

## 2021-12-18 NOTE — Assessment & Plan Note (Signed)
takes Levothyroxine, TSH 2.02 02/13/21

## 2021-12-18 NOTE — Assessment & Plan Note (Signed)
takes Vit D, Vit D level 85 08/29/19

## 2021-12-18 NOTE — Assessment & Plan Note (Signed)
takes Estrogen vaginal cream, Estradiol tab

## 2021-12-18 NOTE — Progress Notes (Signed)
Location:   clinic [facility] Room Number: NO/914B Place of Service:  Home (12) Provider: Marlana Latus NP  Code Status: DNR Goals of Care: [place]    12/04/2021    9:26 AM  Advanced Directives  Does Patient Have a Medical Advance Directive? No  Would patient like information on creating a medical advance directive? No - Patient declined     Chief Complaint  Patient presents with   Acute Visit    Patient is here for a two week follow up    HPI: Patient is a 88 y.o. female seen today for medical management of chronic diseases.        DDD cervical spine per X-ray 12/05/21, c/o left neck/arm/scapula pain for a week or 2, constant, no decreased ROM of neck, shoulder, or arm. No injury.Marland Kitchen denied chest pain, palpitation, or SOB. Last EKG 12/12/20. Cardiology 12/09/21 did not think it represents angina, think it is related to neck,  working with thearpy.              HTN, takes Losartan, HCTZ, Bun/creat 16/0.57 02/13/21             OA, left arm, left scapula pain, left neck             Hypothyroidism, takes Levothyroxine, TSH 2.02 02/13/21             Atrophic vaginitis, takes Estrogen vaginal cream, Estradiol tab             Vit D deficiency, takes Vit D, Vit D level 85 08/29/19             Hyperlipidemia, takes Atorvastatin 49m qd, LDL 87 02/20/21             Hyponatremia, Na 139 02/13/21             Hx of UGI/PUD, f/u GI 10/26/20, off acid reducers. Hgb 12.2 02/13/21 Past Medical History:  Diagnosis Date   Anemia 11/2015   Arthritis    Blood transfusion 2017 JULY 22   Carotid stenosis    Dopplers 37/03 RICA 450-09% LICA 03-81%  Cataract    REMOVED   Chronic headaches    HX MIGRAINES   Collagenous colitis    Diverticulosis 2013   Colonoscopy   Gastric ulcer 11/2015   Glaucoma    BOTH EYES   Hiatal hernia    History of spinal stenosis    History of uterine fibroid    Hx of cardiac catheterization    LHC 3/14: no angiographic CAD, EF 65%   Hx of echocardiogram     Echocardiogram 06/21/12: Mild LVH, EF 682-99% grade 2 diastolic dysfunction, mild MR   Hyperlipidemia    Hypertension    Hypothyroidism    Internal hemorrhoids 2009   Colonoscopy   Migraines    UTI (urinary tract infection) HX OF    Past Surgical History:  Procedure Laterality Date   BUNIONECTOMY  YRS AGO LEFT   RIGHT [facility]E AT [facility] 3 YRS AGO   BUNIONECTOMY Left    CATARACT EXTRACTION W/ INTRAOCULAR LENS IMPLANT  03/2010   Left   CATARACT EXTRACTION W/ INTRAOCULAR LENS IMPLANT Bilateral 11/2010   COLONOSCOPY  2013   NORMAL    ESOPHAGOGASTRODUODENOSCOPY N/A 11/23/2015   Procedure: ESOPHAGOGASTRODUODENOSCOPY (EGD);  Surgeon: NRogene [place] MD;  [facility];  Service: Endoscopy;  Laterality: N/A;   EUS N/A 12/19/2015   Procedure: ESOPHAGEAL ENDOSCOPIC ULTRASOUND (EUS) RADIAL;  Surgeon: DMelene Plan  Ardis Hughs, MD;  Location: [facility];  Service: Endoscopy;  Laterality: N/A;   FUNCTIONAL ENDOSCOPIC SINUS SURGERY     HAMMER TOE SURGERY     HAND SURGERY Right 1998   INGUINAL HERNIA REPAIR Left 03/11/2016   Procedure: LEFT INGUINAL HERINA REPAIR WITH MESH;  Surgeon: Donnie Mesa, MD;  Location: [facility];  Service: General;  Laterality: Left;   INSERTION OF MESH Left 03/11/2016   Procedure: INSERTION OF MESH;  Surgeon: Donnie Mesa, MD;  Location: [facility];  Service: General;  Laterality: Left;   IR RADIOLOGIST EVAL & MGMT  07/01/2017   LAMINECTOMY  03/2008   LAPAROTOMY N/A 11/11/2017   Procedure: MINI LAPAROTOMY;  Surgeon: Everitt Amber, MD;  [facility];  Service: Gynecology;  Laterality: N/A;   LUMBAR LAMINECTOMY/DECOMPRESSION MICRODISCECTOMY N/A 12/23/2020   Procedure: Laminectomy and Foraminotomy - Lumbar one-two;  Surgeon: Eustace [place], MD;  Location: [facility];  Service: Neurosurgery;  Laterality: N/A;   POLYPECTOMY     REVERSE SHOULDER ARTHROPLASTY Right 10/13/2018   Procedure: REVERSE SHOULDER ARTHROPLASTY;  Surgeon: Justice [place], MD;  [facility];  Service: Orthopedics;   Laterality: Right;  11mn   ROBOTIC ASSISTED TOTAL HYSTERECTOMY WITH BILATERAL SALPINGO OOPHERECTOMY Bilateral 11/11/2017   Procedure: XI ROBOTIC ASSISTED TOTAL HYSTERECTOMY WITH BILATERAL SALPINGO OOPHORECTOMY FOR UTERUS GREATER THAN 250 GRAMS;  Surgeon: REveritt Amber MD;  [facility];  Service: Gynecology;  Laterality: Bilateral;   TONSILLECTOMY     TOTAL HIP ARTHROPLASTY  2008   Right    No Known Allergies  Allergies as of 12/18/2021   No Known Allergies      Medication List        Accurate as of December 18, 2021  3:07 PM. If you have any questions, ask your nurse or doctor.          ascorbic acid 500 MG tablet Commonly known as: VITAMIN C Take 500 mg by mouth daily with lunch.   atorvastatin 10 MG tablet Commonly known as: LIPITOR Take 1 tablet (10 mg total) by mouth daily. What changed:  how much to take when to take this Changed by: Camila Maita X Cedarius Kersh, NP   clobetasol ointment 0.05 % Commonly known as: TEMOVATE Apply 1 application topically daily as needed (vaginitis).   estradiol 0.5 MG tablet Commonly known as: ESTRACE TAKE 1 TABLET BY MOUTH AT BEDTIME   hydrochlorothiazide 12.5 MG tablet Commonly known as: HYDRODIURIL TAKE 1 TABLET BY MOUTH EVERY DAY   latanoprost 0.005 % ophthalmic solution Commonly known as: XALATAN Place 1 drop into both eyes at bedtime.   levothyroxine 125 MCG tablet Commonly known as: SYNTHROID TAKE 1 TABLET BY MOUTH EVERY DAY   losartan 100 MG tablet Commonly known as: COZAAR TAKE 1 TABLET BY MOUTH EVERY DAY   multivitamin tablet Take 1 tablet by mouth daily with lunch.   QC TUMERIC COMPLEX PO Take 1,500 Units by mouth 2 (two) times daily.   Vitamin D3 Super Strength 50 MCG (2000 UT) Tabs Generic drug: Cholecalciferol Take 2,000 Units by mouth daily.        Review of Systems:  Review of Systems  Constitutional:  Negative for fatigue and fever.  HENT:  Positive for hearing loss. Negative for congestion and voice  change.   Eyes:  Negative for visual disturbance.  Respiratory:  Negative for cough and shortness of breath.   Cardiovascular:  Negative for leg swelling.  Gastrointestinal:  Negative for abdominal pain and constipation.  Left inguinal hernia repair2017  Genitourinary:  Negative for dysuria, frequency, urgency and vaginal discharge.       1-2x/night  Musculoskeletal:  Positive for arthralgias, back pain and neck pain. Negative for joint swelling.       S/p R shoulder replacement 2020. Lower back, right side sciatica, pain most at night and in am. Left neck/shoulder/scapula/arm pain, constant, no change of ROM, no swelling  neck, shoulder, elbow, wrist, fingers.   Skin:  Negative for color change.  Neurological:  Negative for speech difficulty, light-headedness and headaches.  Psychiatric/Behavioral:  Negative for behavioral problems and sleep disturbance. The patient is not nervous/anxious.     Health Maintenance  Topic Date Due   Zoster Vaccines- Shingrix (1 of 2) Never done   COVID-19 Vaccine (4 - Pfizer risk series) 06/06/2020   INFLUENZA VACCINE  12/02/2021   TETANUS/TDAP  06/13/2030   Pneumonia Vaccine 20+ Years old  Completed   DEXA SCAN  Completed   HPV VACCINES  Aged Out    Physical Exam: Vitals:   12/18/21 1440  BP: 120/70  Pulse: (!) 16  Resp: 16  Temp: (!) 97.5 F (36.4 C)  SpO2: 94%  Weight: 126 lb (57.2 kg)  Height: _0  (1.549 m)   Body mass index is 23.81 kg/m. Physical Exam Vitals reviewed.  Constitutional:      Appearance: Normal appearance.  HENT:     Head: Normocephalic and atraumatic.     Mouth/Throat:     Mouth: Mucous membranes are moist.  Eyes:     Extraocular Movements: Extraocular movements intact.     Conjunctiva/sclera: Conjunctivae normal.     Pupils: Pupils are equal, round, and reactive to light.     Comments: S/p R+L cataract extraction  Cardiovascular:     Rate and Rhythm: Normal rate and regular rhythm.     Heart sounds: No  murmur heard. Pulmonary:     Effort: Pulmonary effort is normal.     Breath sounds: No wheezing or rales.  Abdominal:     General: Bowel sounds are normal.     Palpations: Abdomen is soft.     Tenderness: There is no abdominal tenderness.     Comments: S/p left inguinal hernia repair  Musculoskeletal:        General: Normal range of motion.     Cervical back: Normal range of motion and neck supple.     Right lower leg: No edema.     Left lower leg: No edema.     Comments: S/p R shoulder replacement.   Skin:    General: Skin is warm and dry.     Comments: Surgical scar left inguinal hernia repair.   Neurological:     General: No focal deficit present.     Mental Status: She is alert and oriented to person, place, and time. Mental status is at baseline.     Motor: No weakness.     Coordination: Coordination normal.  Psychiatric:        Mood and Affect: Mood normal.        Behavior: Behavior normal.        Thought Content: Thought content normal.        Judgment: Judgment normal.     Labs reviewed: Basic Metabolic Panel: Recent Labs    02/13/21 0715  NA 139  K 4.0  CL 102  CO2 31  GLUCOSE 81  BUN 16  CREATININE 0.57*  CALCIUM 9.0  TSH 2.02   Liver Function  Tests: Recent Labs    02/13/21 0715  AST 23  ALT 15  BILITOT 0.7  PROT 6.4   No results for input(s): "LIPASE", "AMYLASE" in the last 8760 hours. No results for input(s): "AMMONIA" in the last 8760 hours. CBC: Recent Labs    02/13/21 0715  WBC 5.9  NEUTROABS 3,239  HGB 12.2  HCT 36.5  MCV 91.7  PLT 266   Lipid Panel: Recent Labs    02/20/21 0948  CHOL 161  HDL 58  LDLCALC 87  TRIG 70  CHOLHDL 2.8   No results found for: "HGBA1C"  Procedures since last visit: DG Cervical Spine Complete  Result Date: 12/05/2021 CLINICAL DATA:  Neck pain, no known injury, initial encounter EXAM: CERVICAL SPINE - COMPLETE 4+ VIEW COMPARISON:  None Available. FINDINGS: Seven cervical segments are well  visualized. Vertebral body height is well maintained. Multilevel osteophytic changes are seen. Facet hypertrophic changes are noted as well. Anterolisthesis of C2 on C3 is seen. Retrolisthesis of C4 on C5 is noted. Minimal neural foraminal narrowing is seen. The odontoid is within normal limits. IMPRESSION: Multilevel degenerative change without acute abnormality Electronically Signed   By: Inez Catalina M.D.   On: 12/05/2021 20:09   DG Scapula Left  Result Date: 12/05/2021 CLINICAL DATA:  Left scapular pain, no known injury, initial encounter EXAM: LEFT SCAPULA - 2+ VIEWS COMPARISON:  None Available. FINDINGS: No acute fracture is noted.  No soft tissue abnormality is seen. IMPRESSION: No acute abnormality noted. Electronically Signed   By: Inez Catalina M.D.   On: 12/05/2021 20:01   DG Shoulder Left  Result Date: 12/05/2021 CLINICAL DATA:  Left shoulder pain for several months, no known injury, initial encounter EXAM: LEFT SHOULDER - 2+ VIEW COMPARISON:  None Available. FINDINGS: Mild degenerative changes of the acromioclavicular and glenohumeral joints are noted. No acute fracture or dislocation is seen. Soft tissues are within normal limits. IMPRESSION: No acute abnormality noted. Electronically Signed   By: Inez Catalina M.D.   On: 12/05/2021 20:00    Assessment/Plan  DDD (degenerative disc disease), cervical DDD cervical spine per X-ray 12/05/21, c/o left neck/arm/scapula pain for a week or 2, constant, no decreased ROM of neck, shoulder, or arm. No injury.Marland Kitchen denied chest pain, palpitation, or SOB. Last EKG 12/12/20. Cardiology 12/09/21 did not think it represents angina, think it is related to neck,  working with thearpy. Suggested Neurosurgeon 01/01/22  HTN (hypertension) takes Losartan, HCTZ, Bun/creat 16/0.57 02/13/21  Hypothyroidism takes Levothyroxine, TSH 2.02 02/13/21  Atrophic vaginitis  takes Estrogen vaginal cream, Estradiol tab  Vitamin D deficiency takes Vit D, Vit D level 85  08/29/19  Hyperlipemia  takes Atorvastatin 1m qd, LDL 87    Labs/tests ordered:  pending TSH, lipid panel, CMP/eGFR  Next appt:  02/19/2022

## 2021-12-18 NOTE — Assessment & Plan Note (Signed)
takes Atorvastatin '10mg'$  qd, LDL 87

## 2022-01-01 DIAGNOSIS — M5412 Radiculopathy, cervical region: Secondary | ICD-10-CM | POA: Diagnosis not present

## 2022-01-14 ENCOUNTER — Other Ambulatory Visit: Payer: Self-pay | Admitting: Nurse Practitioner

## 2022-01-22 DIAGNOSIS — M5412 Radiculopathy, cervical region: Secondary | ICD-10-CM | POA: Diagnosis not present

## 2022-02-17 DIAGNOSIS — Z23 Encounter for immunization: Secondary | ICD-10-CM | POA: Diagnosis not present

## 2022-02-19 ENCOUNTER — Other Ambulatory Visit: Payer: Medicare Other

## 2022-02-19 DIAGNOSIS — E871 Hypo-osmolality and hyponatremia: Secondary | ICD-10-CM

## 2022-02-19 DIAGNOSIS — E785 Hyperlipidemia, unspecified: Secondary | ICD-10-CM

## 2022-02-19 DIAGNOSIS — E039 Hypothyroidism, unspecified: Secondary | ICD-10-CM

## 2022-02-19 LAB — COMPLETE METABOLIC PANEL WITH GFR
AG Ratio: 1.4 (calc) (ref 1.0–2.5)
ALT: 18 U/L (ref 6–29)
AST: 26 U/L (ref 10–35)
Albumin: 4.2 g/dL (ref 3.6–5.1)
Alkaline phosphatase (APISO): 55 U/L (ref 37–153)
BUN: 18 mg/dL (ref 7–25)
CO2: 31 mmol/L (ref 20–32)
Calcium: 9.1 mg/dL (ref 8.6–10.4)
Chloride: 101 mmol/L (ref 98–110)
Creat: 0.68 mg/dL (ref 0.60–0.95)
Globulin: 3 g/dL (calc) (ref 1.9–3.7)
Glucose, Bld: 80 mg/dL (ref 65–99)
Potassium: 4.2 mmol/L (ref 3.5–5.3)
Sodium: 138 mmol/L (ref 135–146)
Total Bilirubin: 0.7 mg/dL (ref 0.2–1.2)
Total Protein: 7.2 g/dL (ref 6.1–8.1)
eGFR: 84 mL/min/{1.73_m2} (ref >=60)

## 2022-02-19 LAB — LIPID PANEL
Cholesterol: 148 mg/dL (ref <200)
HDL: 58 mg/dL (ref >=50)
LDL Cholesterol (Calc): 76 mg/dL (calc)
Non-HDL Cholesterol (Calc): 90 mg/dL (calc) (ref <130)
Total CHOL/HDL Ratio: 2.6 (calc) (ref <5.0)
Triglycerides: 61 mg/dL (ref <150)

## 2022-02-19 LAB — TSH
TSH: 0.82 (ref 0.41–5.90)
TSH: 0.82 mIU/L (ref 0.40–4.50)

## 2022-02-20 ENCOUNTER — Other Ambulatory Visit: Payer: Self-pay | Admitting: Nurse Practitioner

## 2022-02-25 ENCOUNTER — Encounter: Payer: Medicare Other | Admitting: Family Medicine

## 2022-02-25 ENCOUNTER — Non-Acute Institutional Stay: Payer: Medicare Other | Admitting: Internal Medicine

## 2022-02-25 ENCOUNTER — Encounter: Payer: Self-pay | Admitting: Internal Medicine

## 2022-02-25 VITALS — BP 138/66 | HR 73 | Temp 97.6°F | Resp 18 | Ht 67.0 in | Wt 127.8 lb

## 2022-02-25 DIAGNOSIS — E2839 Other primary ovarian failure: Secondary | ICD-10-CM

## 2022-02-25 DIAGNOSIS — M8580 Other specified disorders of bone density and structure, unspecified site: Secondary | ICD-10-CM | POA: Diagnosis not present

## 2022-02-25 DIAGNOSIS — N952 Postmenopausal atrophic vaginitis: Secondary | ICD-10-CM

## 2022-02-25 DIAGNOSIS — K2950 Unspecified chronic gastritis without bleeding: Secondary | ICD-10-CM

## 2022-02-25 DIAGNOSIS — E785 Hyperlipidemia, unspecified: Secondary | ICD-10-CM | POA: Diagnosis not present

## 2022-02-25 DIAGNOSIS — M1990 Unspecified osteoarthritis, unspecified site: Secondary | ICD-10-CM | POA: Diagnosis not present

## 2022-02-25 DIAGNOSIS — I10 Essential (primary) hypertension: Secondary | ICD-10-CM

## 2022-02-25 DIAGNOSIS — E039 Hypothyroidism, unspecified: Secondary | ICD-10-CM | POA: Diagnosis not present

## 2022-02-25 NOTE — Progress Notes (Signed)
Location:  [place] of Service:  Clinic (12)  Provider:   Code Status:  Goals of Care:     02/25/2022    1:02 PM  Advanced Directives  Does Patient Have a Medical Advance Directive? Yes  Type of Advance Directive [place] in Chart? Yes - validated most recent copy scanned in chart (See row information)     Chief Complaint  Patient presents with   Medical Management of Chronic Issues    4 Month Follow up with Lab   Quality Metric Gaps    To discuss need for Zoster, Covid and Flu or postpone if patient refuses. NCIR Verified.     HPI: Patient is a 88 y.o. female seen today for medical management of chronic diseases.    Came for Routine Visit  Patient has a history of hypertension, arthritis, hypothyroidism, atrophic vaginitis, hyperlipidemia and hyponatremia Patient also has a history of gastric ulcer, gastritis and collagenous colitis. Had tapered herself off Protonix and budesonide S/p Lumbar Laminectomy in 12/2020 has recovered well Neck Arthritis Did well with Her prednisone last time per patient   No Acute issues Very Active walks 2 miles a day Some discomfort in her Back persists  Past Medical History:  Diagnosis Date   Anemia 11/2015   Arthritis    Blood transfusion 2017 JULY 22   Carotid stenosis    Dopplers 9/83: RICA 38-25%, LICA 0-53%   Cataract    REMOVED   Chronic headaches    HX MIGRAINES   Collagenous colitis    Diverticulosis 2013   Colonoscopy   Gastric ulcer 11/2015   Glaucoma    BOTH EYES   Hiatal hernia    History of spinal stenosis    History of uterine fibroid    Hx of cardiac catheterization    LHC 3/14: no angiographic CAD, EF 65%   Hx of echocardiogram    Echocardiogram 06/21/12: Mild LVH, EF 97-67%, grade 2 diastolic dysfunction, mild MR   Hyperlipidemia    Hypertension    Hypothyroidism    Internal hemorrhoids 2009   Colonoscopy    Migraines    UTI (urinary tract infection) HX OF    Past Surgical History:  Procedure Laterality Date   BUNIONECTOMY  YRS AGO LEFT   RIGHT [facility] AT [facility] 3 YRS AGO   BUNIONECTOMY Left    CATARACT EXTRACTION W/ INTRAOCULAR LENS IMPLANT  03/2010   Left   CATARACT EXTRACTION W/ INTRAOCULAR LENS IMPLANT Bilateral 11/2010   COLONOSCOPY  2013   NORMAL    ESOPHAGOGASTRODUODENOSCOPY N/A 11/23/2015   Procedure: ESOPHAGOGASTRODUODENOSCOPY (EGD);  Surgeon: Rogene Houston, MD;  [facility];  Service: Endoscopy;  Laterality: N/A;   EUS N/A 12/19/2015   Procedure: ESOPHAGEAL ENDOSCOPIC ULTRASOUND (EUS) RADIAL;  Surgeon: Milus Banister, MD;  [facility];  Service: Endoscopy;  Laterality: N/A;   FUNCTIONAL ENDOSCOPIC SINUS SURGERY     HAMMER TOE SURGERY     HAND SURGERY Right 1998   INGUINAL HERNIA REPAIR Left 03/11/2016   Procedure: LEFT INGUINAL HERINA REPAIR WITH MESH;  Surgeon: Donnie Mesa, MD;  Location: [place];  Service: General;  Laterality: Left;   INSERTION OF MESH Left 03/11/2016   Procedure: INSERTION OF MESH;  Surgeon: Donnie Mesa, MD;  Location: [place];  Service: General;  Laterality: Left;   IR RADIOLOGIST EVAL & MGMT  07/01/2017   LAMINECTOMY  03/2008  LAPAROTOMY N/A 11/11/2017   Procedure: MINI LAPAROTOMY;  Surgeon: Everitt Amber, MD;  [facility];  Service: Gynecology;  Laterality: N/A;   LUMBAR LAMINECTOMY/DECOMPRESSION MICRODISCECTOMY N/A 12/23/2020   Procedure: Laminectomy and Foraminotomy - Lumbar one-two;  Surgeon: Eustace Moore, MD;  Location: [place];  Service: Neurosurgery;  Laterality: N/A;   POLYPECTOMY     REVERSE SHOULDER ARTHROPLASTY Right 10/13/2018   Procedure: REVERSE SHOULDER ARTHROPLASTY;  Surgeon: Justice Britain, MD;  [facility];  Service: Orthopedics;  Laterality: Right;  156mn   ROBOTIC ASSISTED TOTAL HYSTERECTOMY WITH BILATERAL SALPINGO OOPHERECTOMY Bilateral 11/11/2017   Procedure: XI ROBOTIC ASSISTED TOTAL HYSTERECTOMY WITH  BILATERAL SALPINGO OOPHORECTOMY FOR UTERUS GREATER THAN 250 GRAMS;  Surgeon: REveritt Amber MD;  [facility];  Service: Gynecology;  Laterality: Bilateral;   TONSILLECTOMY     TOTAL HIP ARTHROPLASTY  2008   Right    No Known Allergies  Outpatient Encounter Medications as of 02/25/2022  Medication Sig   atorvastatin (LIPITOR) 10 MG tablet Take 1 tablet (10 mg total) by mouth daily.   Cholecalciferol (VITAMIN D3 SUPER STRENGTH) 50 MCG (2000 UT) TABS Take 2,000 Units by mouth daily.   clobetasol ointment (TEMOVATE) 05.40% Apply 1 application topically daily as needed (vaginitis).   estradiol (ESTRACE) 0.5 MG tablet TAKE 1 TABLET BY MOUTH AT BEDTIME   hydrochlorothiazide (HYDRODIURIL) 12.5 MG tablet TAKE 1 TABLET BY MOUTH EVERY DAY   latanoprost (XALATAN) 0.005 % ophthalmic solution Place 1 drop into both eyes at bedtime.   levothyroxine (SYNTHROID) 125 MCG tablet TAKE 1 TABLET BY MOUTH EVERY DAY   losartan (COZAAR) 100 MG tablet TAKE 1 TABLET BY MOUTH EVERY DAY   Multiple Vitamin (MULTIVITAMIN) tablet Take 1 tablet by mouth daily with lunch.   Turmeric (QC TUMERIC COMPLEX PO) Take 1,500 Units by mouth 2 (two) times daily.   vitamin C (ASCORBIC ACID) 500 MG tablet Take 500 mg by mouth daily with lunch.   No facility-administered encounter medications on file as of 02/25/2022.    Review of Systems:  Review of Systems  Constitutional:  Negative for activity change and appetite change.  HENT: Negative.    Respiratory:  Negative for cough and shortness of breath.   Cardiovascular:  Negative for leg swelling.  Gastrointestinal:  Negative for constipation.  Genitourinary: Negative.   Musculoskeletal:  Negative for arthralgias, gait problem and myalgias.  Skin: Negative.   Neurological:  Negative for dizziness and weakness.  Psychiatric/Behavioral:  Negative for confusion, dysphoric mood and sleep disturbance.     Health Maintenance  Topic Date Due   Zoster Vaccines- Shingrix (1 of  2) Never done   Medicare Annual Wellness (AWV)  12/07/2019   COVID-19 Vaccine (4 - Pfizer risk series) 06/06/2020   TETANUS/TDAP  06/13/2030   Pneumonia Vaccine 88 Years old  Completed   INFLUENZA VACCINE  Completed   DEXA SCAN  Completed   HPV VACCINES  Aged Out    Physical Exam: Vitals:   02/25/22 1304  BP: 138/66  Pulse: 73  Resp: 18  Temp: 97.6 F (36.4 C)  SpO2: 95%  Weight: 127 lb 12.8 oz (58 kg)  Height: '5\' 7"'$  (1.702 m)   Body mass index is 20.02 kg/m. Physical Exam Vitals reviewed.  Constitutional:      Appearance: Normal appearance.  HENT:     Head: Normocephalic.     Right Ear: Tympanic membrane normal.     Left Ear: Tympanic membrane normal.     Nose: Nose  normal.     Mouth/Throat:     Mouth: Mucous membranes are moist.     Pharynx: Oropharynx is clear.  Eyes:     Pupils: Pupils are equal, round, and reactive to light.  Cardiovascular:     Rate and Rhythm: Normal rate and regular rhythm.     Pulses: Normal pulses.     Heart sounds: Normal heart sounds. No murmur heard. Pulmonary:     Effort: Pulmonary effort is normal.     Breath sounds: Normal breath sounds.  Abdominal:     General: Abdomen is flat. Bowel sounds are normal.     Palpations: Abdomen is soft.  Musculoskeletal:        General: No swelling.     Cervical back: Neck supple.  Skin:    General: Skin is warm.  Neurological:     General: No focal deficit present.     Mental Status: She is alert and oriented to person, place, and time.  Psychiatric:        Mood and Affect: Mood normal.        Thought Content: Thought content normal.     Labs reviewed: Basic Metabolic Panel: Recent Labs    02/19/22 0715  NA 138  K 4.2  CL 101  CO2 31  GLUCOSE 80  BUN 18  CREATININE 0.68  CALCIUM 9.1  TSH 0.82   Liver Function Tests: Recent Labs    02/19/22 0715  AST 26  ALT 18  BILITOT 0.7  PROT 7.2   No results for input(s): "LIPASE", "AMYLASE" in the last 8760 hours. No results  for input(s): "AMMONIA" in the last 8760 hours. CBC: No results for input(s): "WBC", "NEUTROABS", "HGB", "HCT", "MCV", "PLT" in the last 8760 hours. Lipid Panel: Recent Labs    02/19/22 0715  CHOL 148  HDL 58  LDLCALC 76  TRIG 61  CHOLHDL 2.6   No results found for: "HGBA1C"  Procedures since last visit: No results found.  Assessment/Plan 1. Primary hypertension Cozaar and HCTZ Doing well  2. Hypothyroidism, unspecified type TSH normal 10/23  3. Hyperlipidemia, unspecified hyperlipidemia type On statin  4. Chronic gastritis without bleeding, unspecified gastritis type Uses Protonix PRn  5. Atrophic vaginitis PO estrogen and Clobestal  6. Osteoarthritis, unspecified osteoarthritis type, unspecified site Does turmeric 7. Osteopenia, unspecified location Repeat DEXA  - DG BONE DENSITY (DXA); Future    Labs/tests ordered:  * No order type specified * Next appt:  Visit date not found

## 2022-03-19 ENCOUNTER — Telehealth: Payer: Self-pay | Admitting: *Deleted

## 2022-03-19 ENCOUNTER — Other Ambulatory Visit: Payer: Self-pay | Admitting: Nurse Practitioner

## 2022-03-19 MED ORDER — METHYLPREDNISOLONE 4 MG PO TBPK: ORAL_TABLET | ORAL | 0 refills | Status: DC

## 2022-03-19 NOTE — Telephone Encounter (Signed)
I think it was Medrol pack. I have ordered it to her [place].

## 2022-03-19 NOTE — Telephone Encounter (Signed)
Patient called and stated that she was seen back in October for a Pinched nerve in her neck, shoulder and arm pain and given Prednisone which helped.   Patient is having the same pain again and is waning to know if the Prednisone can be prescribed again without her being seen.   Husband passed away on 08/06/22 and she thinks that has exacerbated the symptoms.   Please Advise.

## 2022-03-19 NOTE — Telephone Encounter (Signed)
Patient notified and agreed.  

## 2022-04-01 ENCOUNTER — Other Ambulatory Visit: Payer: Self-pay | Admitting: Nurse Practitioner

## 2022-04-01 ENCOUNTER — Ambulatory Visit: Payer: Medicare Other | Admitting: Internal Medicine

## 2022-04-01 ENCOUNTER — Encounter: Payer: Self-pay | Admitting: Internal Medicine

## 2022-04-01 ENCOUNTER — Other Ambulatory Visit: Payer: Self-pay | Admitting: Internal Medicine

## 2022-04-01 ENCOUNTER — Non-Acute Institutional Stay: Payer: Medicare Other | Admitting: Internal Medicine

## 2022-04-01 VITALS — BP 178/94 | HR 84 | Temp 97.8°F | Resp 18 | Ht 67.0 in | Wt 124.0 lb

## 2022-04-01 DIAGNOSIS — M542 Cervicalgia: Secondary | ICD-10-CM

## 2022-04-01 DIAGNOSIS — I10 Essential (primary) hypertension: Secondary | ICD-10-CM

## 2022-04-01 DIAGNOSIS — F4321 Adjustment disorder with depressed mood: Secondary | ICD-10-CM | POA: Diagnosis not present

## 2022-04-01 MED ORDER — GABAPENTIN 100 MG PO CAPS: 100.0000 mg | ORAL_CAPSULE | Freq: Three times a day (TID) | ORAL | 3 refills | Status: DC

## 2022-04-01 NOTE — Telephone Encounter (Signed)
Pharmacy is requesting 90 day supply for patient. Medication pend and sent to PCP Mast, Man X, NP

## 2022-04-02 ENCOUNTER — Telehealth: Payer: Self-pay | Admitting: *Deleted

## 2022-04-02 MED ORDER — GABAPENTIN 100 MG PO CAPS: 100.0000 mg | ORAL_CAPSULE | Freq: Three times a day (TID) | ORAL | 3 refills | Status: DC

## 2022-04-02 NOTE — Telephone Encounter (Signed)
Patient called and stated that she was seen yesterday by Dr. Lyndel Safe and she called in Gabapentin.   Stated that her insurance will not cover the medication unless it is a 90 day supply.   Requesting 90 day supply Rx to be sent to pharmacy.   Pended Rx and sent to Dr. Lyndel Safe for approval.

## 2022-04-02 NOTE — Progress Notes (Signed)
Location: [facility] (12)  Provider:   Code Status:  Goals of Care:     04/01/2022   10:48 AM  Advanced Directives  Does Patient Have a Medical Advance Directive? Yes  Type of Advance Directive [place]  Does patient want to make changes to medical advance directive? No - Patient declined  Copy of [place] in Chart? Yes - validated most recent copy scanned in chart (See row information)     Chief Complaint  Patient presents with   Acute Visit    Patient states she has been having some neck and shoulder pain. Patient states it has flared back up early november   Immunizations    Discussed the need for shingles and covid vaccine   Quality Metric Gaps    Discussed the need for AWV    HPI: Patient is a 88 y.o. female seen today for Acute visit  Lives in [place] apartment  Patient recently lost her husband Very upset and tearful She said that she had to bend down a lot with him and it has triggered pain in her neck .  She has seen Dr. Ronnald Ramp before and was told that she has cervical DJD.   He did not recommend surgery had given her Medrol pack for pain in August which helped.  I also called in Medrol pack on 03/19/22 which she just finished a week ago.   She says it did not help at all.   Her pain is in her neck going down her shoulder and down her left arm.   She states that tramadol and Norco has not helped either. She has taken Zanaflex which has helped her before.  Other issues Patient also has a history of hypertension, arthritis, hypothyroidism, atrophic vaginitis, hyperlipidemia and hyponatremia Patient also has a history of gastric ulcer, gastritis and collagenous colitis. Had tapered herself off Protonix and budesonide S/p Lumbar Laminectomy in 12/2020 has recovered well     Past Medical History:  Diagnosis Date   Anemia 11/2015   Arthritis    Blood transfusion 2017 JULY 22    Carotid stenosis    Dopplers 2/75: RICA 17-00%, LICA 1-74%   Cataract    REMOVED   Chronic headaches    HX MIGRAINES   Collagenous colitis    Diverticulosis 2013   Colonoscopy   Gastric ulcer 11/2015   Glaucoma    BOTH EYES   Hiatal hernia    History of spinal stenosis    History of uterine fibroid    Hx of cardiac catheterization    LHC 3/14: no angiographic CAD, EF 65%   Hx of echocardiogram    Echocardiogram 06/21/12: Mild LVH, EF 94-49%, grade 2 diastolic dysfunction, mild MR   Hyperlipidemia    Hypertension    Hypothyroidism    Internal hemorrhoids 2009   Colonoscopy   Migraines    UTI (urinary tract infection) HX OF    Past Surgical History:  Procedure Laterality Date   BUNIONECTOMY  YRS AGO LEFT   RIGHT [facility] AT [facility] 3 YRS AGO   BUNIONECTOMY Left    CATARACT EXTRACTION W/ INTRAOCULAR LENS IMPLANT  03/2010   Left   CATARACT EXTRACTION W/ INTRAOCULAR LENS IMPLANT Bilateral 11/2010   COLONOSCOPY  2013   NORMAL    ESOPHAGOGASTRODUODENOSCOPY N/A 11/23/2015   Procedure: ESOPHAGOGASTRODUODENOSCOPY (EGD);  Surgeon: Rogene Houston, MD;  [facility];  Service: Endoscopy;  Laterality: N/A;   EUS N/A 12/19/2015   Procedure: ESOPHAGEAL ENDOSCOPIC ULTRASOUND (EUS) RADIAL;  Surgeon: Milus Banister, MD;  [facility];  Service: Endoscopy;  Laterality: N/A;   FUNCTIONAL ENDOSCOPIC SINUS SURGERY     HAMMER TOE SURGERY     HAND SURGERY Right 1998   INGUINAL HERNIA REPAIR Left 03/11/2016   Procedure: LEFT INGUINAL HERINA REPAIR WITH MESH;  Surgeon: Donnie Mesa, MD;  Location: [place];  Service: General;  Laterality: Left;   INSERTION OF MESH Left 03/11/2016   Procedure: INSERTION OF MESH;  Surgeon: Donnie Mesa, MD;  Location: [place];  Service: General;  Laterality: Left;   IR RADIOLOGIST EVAL & MGMT  07/01/2017   LAMINECTOMY  03/2008   LAPAROTOMY N/A 11/11/2017   Procedure: MINI LAPAROTOMY;  Surgeon: Everitt Amber, MD;  [facility];  Service: Gynecology;   Laterality: N/A;   LUMBAR LAMINECTOMY/DECOMPRESSION MICRODISCECTOMY N/A 12/23/2020   Procedure: Laminectomy and Foraminotomy - Lumbar one-two;  Surgeon: Eustace Moore, MD;  Location: [place];  Service: Neurosurgery;  Laterality: N/A;   POLYPECTOMY     REVERSE SHOULDER ARTHROPLASTY Right 10/13/2018   Procedure: REVERSE SHOULDER ARTHROPLASTY;  Surgeon: Justice Britain, MD;  [facility];  Service: Orthopedics;  Laterality: Right;  117mn   ROBOTIC ASSISTED TOTAL HYSTERECTOMY WITH BILATERAL SALPINGO OOPHERECTOMY Bilateral 11/11/2017   Procedure: XI ROBOTIC ASSISTED TOTAL HYSTERECTOMY WITH BILATERAL SALPINGO OOPHORECTOMY FOR UTERUS GREATER THAN 250 GRAMS;  Surgeon: REveritt Amber MD;  [facility];  Service: Gynecology;  Laterality: Bilateral;   TONSILLECTOMY     TOTAL HIP ARTHROPLASTY  2008   Right    No Known Allergies  Outpatient Encounter Medications as of 04/01/2022  Medication Sig   atorvastatin (LIPITOR) 10 MG tablet Take 1 tablet (10 mg total) by mouth daily.   Cholecalciferol (VITAMIN D3 SUPER STRENGTH) 50 MCG (2000 UT) TABS Take 2,000 Units by mouth daily.   clobetasol ointment (TEMOVATE) 08.33% Apply 1 application topically daily as needed (vaginitis).   estradiol (ESTRACE) 0.5 MG tablet TAKE 1 TABLET BY MOUTH AT BEDTIME   hydrochlorothiazide (HYDRODIURIL) 12.5 MG tablet TAKE 1 TABLET BY MOUTH EVERY DAY   latanoprost (XALATAN) 0.005 % ophthalmic solution Place 1 drop into both eyes at bedtime.   levothyroxine (SYNTHROID) 125 MCG tablet TAKE 1 TABLET BY MOUTH EVERY DAY   losartan (COZAAR) 100 MG tablet TAKE 1 TABLET BY MOUTH EVERY DAY   Multiple Vitamin (MULTIVITAMIN) tablet Take 1 tablet by mouth daily with lunch.   tiZANidine (ZANAFLEX) 4 MG capsule Take 4 mg by mouth 2 (two) times daily as needed for muscle spasms.   Turmeric (QC TUMERIC COMPLEX PO) Take 1,500 Units by mouth 2 (two) times daily.   vitamin C (ASCORBIC ACID) 500 MG tablet Take 500 mg by mouth daily with lunch.    [DISCONTINUED] gabapentin (NEURONTIN) 100 MG capsule Take 1 capsule (100 mg total) by mouth 3 (three) times daily.   [DISCONTINUED] hydrochlorothiazide (HYDRODIURIL) 12.5 MG tablet TAKE 1 TABLET BY MOUTH EVERY DAY   [DISCONTINUED] methylPREDNISolone (MEDROL DOSEPAK) 4 MG TBPK tablet Take per pharmacy instructions   No facility-administered encounter medications on file as of 04/01/2022.    Review of Systems:  Review of Systems  Constitutional:  Negative for activity change and appetite change.  HENT: Negative.    Respiratory:  Negative for cough and shortness of breath.   Cardiovascular:  Negative for leg swelling.  Gastrointestinal:  Negative for constipation.  Genitourinary: Negative.   Musculoskeletal:  Positive for arthralgias, back pain, neck pain and neck stiffness. Negative for gait problem and myalgias.  Skin: Negative.   Neurological:  Negative for dizziness and weakness.  Psychiatric/Behavioral:  Positive for dysphoric mood. Negative for confusion and sleep disturbance. The patient is nervous/anxious.     Health Maintenance  Topic Date Due   Zoster Vaccines- Shingrix (1 of 2) Never done   Medicare Annual Wellness (AWV)  11/07/2019   COVID-19 Vaccine (4 - 2023-24 season) 01/02/2022   DTaP/Tdap/Td (2 - Td or Tdap) 06/13/2030   Pneumonia Vaccine 58+ Years old  Completed   INFLUENZA VACCINE  Completed   DEXA SCAN  Completed   HPV VACCINES  Aged Out    Physical Exam: Vitals:   04/01/22 1044 04/01/22 1049  BP: (!) 180/92 (!) 178/94  Pulse: 84   Resp: 18   Temp: 97.8 F (36.6 C)   TempSrc: Temporal   SpO2: 96%   Weight: 124 lb (56.2 kg)   Height: '5\' 7"'$  (1.702 m)    Body mass index is 19.42 kg/m. Physical Exam Vitals reviewed.  Constitutional:      Appearance: Normal appearance.  HENT:     Head: Normocephalic.     Nose: Nose normal.     Mouth/Throat:     Mouth: Mucous membranes are moist.     Pharynx: Oropharynx is clear.  Eyes:     Pupils: Pupils are  equal, round, and reactive to light.  Cardiovascular:     Rate and Rhythm: Normal rate and regular rhythm.     Pulses: Normal pulses.     Heart sounds: Normal heart sounds. No murmur heard. Pulmonary:     Effort: Pulmonary effort is normal.     Breath sounds: Normal breath sounds.  Abdominal:     General: Abdomen is flat. Bowel sounds are normal.     Palpations: Abdomen is soft.  Musculoskeletal:        General: No swelling.     Cervical back: Neck supple.  Skin:    General: Skin is warm.  Neurological:     General: No focal deficit present.     Mental Status: She is alert and oriented to person, place, and time.     Comments: Good UE strength Bilateral Good Neck movement    Psychiatric:        Mood and Affect: Mood normal.        Thought Content: Thought content normal.     Labs reviewed: Basic Metabolic Panel: Recent Labs    02/19/22 0715  NA 138  K 4.2  CL 101  CO2 31  GLUCOSE 80  BUN 18  CREATININE 0.68  CALCIUM 9.1  TSH 0.82   Liver Function Tests: Recent Labs    02/19/22 0715  AST 26  ALT 18  BILITOT 0.7  PROT 7.2   No results for input(s): "LIPASE", "AMYLASE" in the last 8760 hours. No results for input(s): "AMMONIA" in the last 8760 hours. CBC: No results for input(s): "WBC", "NEUTROABS", "HGB", "HCT", "MCV", "PLT" in the last 8760 hours. Lipid Panel: Recent Labs    02/19/22 0715  CHOL 148  HDL 58  LDLCALC 76  TRIG 61  CHOLHDL 2.6   No results found for: "HGBA1C"  Procedures since last visit: No results found.  Assessment/Plan 1. Acute neck pain Discussed different options She does not want to try prednisone anymore Cannot use Mobic because of her history of severe gastritis Will try Zanaflex twice a day  Gabapentin 100  mg 3 times daily  2. Primary hypertension Usually her blood pressure runs well but today she was very upset Will check her blood pressure at home and bring it next visit  3. Grieving due to loss of her  husband I suggested her to connect with the therapist with hospice She does not think she needs anything like Ativan or antidepressant right now. She will reach out to me if she needs any help   Total time spent in this patient care encounter was  45_  minutes; greater than 50% of the visit spent counseling patient and staff, reviewing records , Labs and coordinating care for problems addressed at this encounter.   Labs/tests ordered:   Next appt:  04/01/2022

## 2022-04-15 ENCOUNTER — Non-Acute Institutional Stay: Payer: Medicare Other | Admitting: Internal Medicine

## 2022-04-15 ENCOUNTER — Encounter: Payer: Self-pay | Admitting: Internal Medicine

## 2022-04-15 VITALS — BP 132/84 | HR 81 | Temp 97.9°F | Resp 17 | Ht 67.0 in | Wt 126.8 lb

## 2022-04-15 DIAGNOSIS — N952 Postmenopausal atrophic vaginitis: Secondary | ICD-10-CM

## 2022-04-15 DIAGNOSIS — K2950 Unspecified chronic gastritis without bleeding: Secondary | ICD-10-CM | POA: Diagnosis not present

## 2022-04-15 DIAGNOSIS — E785 Hyperlipidemia, unspecified: Secondary | ICD-10-CM

## 2022-04-15 DIAGNOSIS — F4321 Adjustment disorder with depressed mood: Secondary | ICD-10-CM | POA: Diagnosis not present

## 2022-04-15 DIAGNOSIS — E039 Hypothyroidism, unspecified: Secondary | ICD-10-CM | POA: Diagnosis not present

## 2022-04-15 DIAGNOSIS — N8110 Cystocele, unspecified: Secondary | ICD-10-CM | POA: Diagnosis not present

## 2022-04-15 DIAGNOSIS — M542 Cervicalgia: Secondary | ICD-10-CM

## 2022-04-15 DIAGNOSIS — I10 Essential (primary) hypertension: Secondary | ICD-10-CM

## 2022-04-15 DIAGNOSIS — N816 Rectocele: Secondary | ICD-10-CM | POA: Diagnosis not present

## 2022-04-15 DIAGNOSIS — Z4689 Encounter for fitting and adjustment of other specified devices: Secondary | ICD-10-CM | POA: Diagnosis not present

## 2022-04-15 NOTE — Progress Notes (Unsigned)
Location: [place] of Service:  Clinic (12)  Provider:   Code Status: *** Goals of Care:     04/15/2022    9:29 AM  Advanced Directives  Does Patient Have a Medical Advance Directive? Yes  Type of Advance Directive [place] in Chart? Yes - validated most recent copy scanned in chart (See row information)     Chief Complaint  Patient presents with   Medical Management of Chronic Issues    2 week follow up for her pinched nerve patient states she is feeling a little better   Immunizations    Discussed the need for covid and shingles vaccine     HPI: Patient is a 88 y.o. female seen today for an acute visit for  Past Medical History:  Diagnosis Date   Anemia 11/2015   Arthritis    Blood transfusion 2017 JULY 22   Carotid stenosis    Dopplers 6/76: RICA 72-09%, LICA 4-70%   Cataract    REMOVED   Chronic headaches    HX MIGRAINES   Collagenous colitis    Diverticulosis 2013   Colonoscopy   Gastric ulcer 11/2015   Glaucoma    BOTH EYES   Hiatal hernia    History of spinal stenosis    History of uterine fibroid    Hx of cardiac catheterization    LHC 3/14: no angiographic CAD, EF 65%   Hx of echocardiogram    Echocardiogram 06/21/12: Mild LVH, EF 96-28%, grade 2 diastolic dysfunction, mild MR   Hyperlipidemia    Hypertension    Hypothyroidism    Internal hemorrhoids 2009   Colonoscopy   Migraines    UTI (urinary tract infection) HX OF    Past Surgical History:  Procedure Laterality Date   BUNIONECTOMY  YRS AGO LEFT   RIGHT [facility] AT [facility] 3 YRS AGO   BUNIONECTOMY Left    CATARACT EXTRACTION W/ INTRAOCULAR LENS IMPLANT  03/2010   Left   CATARACT EXTRACTION W/ INTRAOCULAR LENS IMPLANT Bilateral 11/2010   COLONOSCOPY  2013   NORMAL    ESOPHAGOGASTRODUODENOSCOPY N/A 11/23/2015   Procedure: ESOPHAGOGASTRODUODENOSCOPY (EGD);  Surgeon: Rogene Houston, MD;  [facility];   Service: Endoscopy;  Laterality: N/A;   EUS N/A 12/19/2015   Procedure: ESOPHAGEAL ENDOSCOPIC ULTRASOUND (EUS) RADIAL;  Surgeon: Milus Banister, MD;  [facility];  Service: Endoscopy;  Laterality: N/A;   FUNCTIONAL ENDOSCOPIC SINUS SURGERY     HAMMER TOE SURGERY     HAND SURGERY Right 1998   INGUINAL HERNIA REPAIR Left 03/11/2016   Procedure: LEFT INGUINAL HERINA REPAIR WITH MESH;  Surgeon: Donnie Mesa, MD;  Location: [place];  Service: General;  Laterality: Left;   INSERTION OF MESH Left 03/11/2016   Procedure: INSERTION OF MESH;  Surgeon: Donnie Mesa, MD;  Location: [place];  Service: General;  Laterality: Left;   IR RADIOLOGIST EVAL & MGMT  07/01/2017   LAMINECTOMY  03/2008   LAPAROTOMY N/A 11/11/2017   Procedure: MINI LAPAROTOMY;  Surgeon: Everitt Amber, MD;  [facility];  Service: Gynecology;  Laterality: N/A;   LUMBAR LAMINECTOMY/DECOMPRESSION MICRODISCECTOMY N/A 12/23/2020   Procedure: Laminectomy and Foraminotomy - Lumbar one-two;  Surgeon: Eustace Moore, MD;  Location: [facility];  Service: Neurosurgery;  Laterality: N/A;   POLYPECTOMY     REVERSE SHOULDER ARTHROPLASTY Right 10/13/2018   Procedure: REVERSE SHOULDER ARTHROPLASTY;  Surgeon: Justice Britain, MD;  [facility];  Service: Orthopedics;  Laterality: Right;  177mn   ROBOTIC ASSISTED TOTAL HYSTERECTOMY WITH BILATERAL SALPINGO OOPHERECTOMY Bilateral 11/11/2017   Procedure: XI ROBOTIC ASSISTED TOTAL HYSTERECTOMY WITH BILATERAL SALPINGO OOPHORECTOMY FOR UTERUS GREATER THAN 250 GRAMS;  Surgeon: REveritt Amber MD;  [facility];  Service: Gynecology;  Laterality: Bilateral;   TONSILLECTOMY     TOTAL HIP ARTHROPLASTY  2008   Right    No Known Allergies  Outpatient Encounter Medications as of 04/15/2022  Medication Sig   atorvastatin (LIPITOR) 10 MG tablet Take 1 tablet (10 mg total) by mouth daily.   Cholecalciferol (VITAMIN D3 SUPER STRENGTH) 50 MCG (2000 UT) TABS Take 2,000 Units by mouth daily.   clobetasol  ointment (TEMOVATE) 05.63% Apply 1 application topically daily as needed (vaginitis).   estradiol (ESTRACE) 0.5 MG tablet TAKE 1 TABLET BY MOUTH AT BEDTIME   gabapentin (NEURONTIN) 100 MG capsule Take 1 capsule (100 mg total) by mouth 3 (three) times daily.   hydrochlorothiazide (HYDRODIURIL) 12.5 MG tablet TAKE 1 TABLET BY MOUTH EVERY DAY   latanoprost (XALATAN) 0.005 % ophthalmic solution Place 1 drop into both eyes at bedtime.   levothyroxine (SYNTHROID) 125 MCG tablet TAKE 1 TABLET BY MOUTH EVERY DAY   losartan (COZAAR) 100 MG tablet TAKE 1 TABLET BY MOUTH EVERY DAY   Multiple Vitamin (MULTIVITAMIN) tablet Take 1 tablet by mouth daily with lunch.   tiZANidine (ZANAFLEX) 4 MG capsule Take 4 mg by mouth 2 (two) times daily as needed for muscle spasms.   Turmeric (QC TUMERIC COMPLEX PO) Take 1,500 Units by mouth 2 (two) times daily.   vitamin C (ASCORBIC ACID) 500 MG tablet Take 500 mg by mouth daily with lunch.   No facility-administered encounter medications on file as of 04/15/2022.    Review of Systems:  Review of Systems  Health Maintenance  Topic Date Due   Zoster Vaccines- Shingrix (1 of 2) Never done   Medicare Annual Wellness (AWV)  11/07/2019   COVID-19 Vaccine (4 - 2023-24 season) 01/02/2022   DTaP/Tdap/Td (2 - Td or Tdap) 06/13/2030   Pneumonia Vaccine 88 Years old  Completed   INFLUENZA VACCINE  Completed   DEXA SCAN  Completed   HPV VACCINES  Aged Out    Physical Exam: Vitals:   04/15/22 0926  BP: 132/84  Pulse: 81  Resp: 17  Temp: 97.9 F (36.6 C)  TempSrc: Temporal  SpO2: 99%  Weight: 126 lb 12.8 oz (57.5 kg)  Height: '5\' 7"'$  (1.702 m)   Body mass index is 19.86 kg/m. Physical Exam  Labs reviewed: Basic Metabolic Panel: Recent Labs    02/19/22 0715  NA 138  K 4.2  CL 101  CO2 31  GLUCOSE 80  BUN 18  CREATININE 0.68  CALCIUM 9.1  TSH 0.82   Liver Function Tests: Recent Labs    02/19/22 0715  AST 26  ALT 18  BILITOT 0.7  PROT 7.2    No results for input(s): "LIPASE", "AMYLASE" in the last 8760 hours. No results for input(s): "AMMONIA" in the last 8760 hours. CBC: No results for input(s): "WBC", "NEUTROABS", "HGB", "HCT", "MCV", "PLT" in the last 8760 hours. Lipid Panel: Recent Labs    02/19/22 0715  CHOL 148  HDL 58  LDLCALC 76  TRIG 61  CHOLHDL 2.6   No results found for: "HGBA1C"  Procedures since last visit: No results found.  Assessment/Plan There are no diagnoses linked to this encounter.   Labs/tests ordered:  *  No order type specified * Next appt:  08/20/2022

## 2022-04-30 DIAGNOSIS — H401431 Capsular glaucoma with pseudoexfoliation of lens, bilateral, mild stage: Secondary | ICD-10-CM | POA: Diagnosis not present

## 2022-05-05 DIAGNOSIS — N8110 Cystocele, unspecified: Secondary | ICD-10-CM | POA: Diagnosis not present

## 2022-05-05 DIAGNOSIS — Z4689 Encounter for fitting and adjustment of other specified devices: Secondary | ICD-10-CM | POA: Diagnosis not present

## 2022-05-18 DIAGNOSIS — M545 Low back pain, unspecified: Secondary | ICD-10-CM | POA: Diagnosis not present

## 2022-06-10 ENCOUNTER — Other Ambulatory Visit: Payer: Self-pay | Admitting: Internal Medicine

## 2022-06-10 DIAGNOSIS — Z1231 Encounter for screening mammogram for malignant neoplasm of breast: Secondary | ICD-10-CM

## 2022-06-12 ENCOUNTER — Other Ambulatory Visit: Payer: Self-pay

## 2022-06-12 MED ORDER — GABAPENTIN 100 MG PO CAPS: 100.0000 mg | ORAL_CAPSULE | Freq: Two times a day (BID) | ORAL | 1 refills | Status: DC

## 2022-07-06 ENCOUNTER — Other Ambulatory Visit: Payer: Medicare Other

## 2022-07-06 DIAGNOSIS — E039 Hypothyroidism, unspecified: Secondary | ICD-10-CM | POA: Diagnosis not present

## 2022-07-06 DIAGNOSIS — E785 Hyperlipidemia, unspecified: Secondary | ICD-10-CM | POA: Diagnosis not present

## 2022-07-06 DIAGNOSIS — I10 Essential (primary) hypertension: Secondary | ICD-10-CM

## 2022-07-07 LAB — CBC WITH DIFFERENTIAL/PLATELET
Absolute Monocytes: 367 cells/uL (ref 200–950)
Basophils Absolute: 10 cells/uL (ref 0–200)
Basophils Relative: 0.2 %
Eosinophils Absolute: 102 cells/uL (ref 15–500)
Eosinophils Relative: 2 %
HCT: 36.9 % (ref 35.0–45.0)
Hemoglobin: 12.7 g/dL (ref 11.7–15.5)
Lymphs Abs: 1826 cells/uL (ref 850–3900)
MCH: 30.1 pg (ref 27.0–33.0)
MCHC: 34.4 g/dL (ref 32.0–36.0)
MCV: 87.4 fL (ref 80.0–100.0)
MPV: 10.5 fL (ref 7.5–12.5)
Monocytes Relative: 7.2 %
Neutro Abs: 2795 cells/uL (ref 1500–7800)
Neutrophils Relative %: 54.8 %
Platelets: 111 10*3/uL — ABNORMAL LOW (ref 140–400)
RBC: 4.22 10*6/uL (ref 3.80–5.10)
RDW: 12.1 % (ref 11.0–15.0)
Total Lymphocyte: 35.8 %
WBC: 5.1 10*3/uL (ref 3.8–10.8)

## 2022-07-07 LAB — COMPLETE METABOLIC PANEL WITH GFR
AG Ratio: 1.4 (calc) (ref 1.0–2.5)
ALT: 20 U/L (ref 6–29)
AST: 30 U/L (ref 10–35)
Albumin: 4 g/dL (ref 3.6–5.1)
Alkaline phosphatase (APISO): 59 U/L (ref 37–153)
BUN: 14 mg/dL (ref 7–25)
CO2: 27 mmol/L (ref 20–32)
Calcium: 8.9 mg/dL (ref 8.6–10.4)
Chloride: 100 mmol/L (ref 98–110)
Creat: 0.66 mg/dL (ref 0.60–0.95)
Globulin: 2.8 g/dL (calc) (ref 1.9–3.7)
Glucose, Bld: 77 mg/dL (ref 65–99)
Potassium: 4.2 mmol/L (ref 3.5–5.3)
Sodium: 138 mmol/L (ref 135–146)
Total Bilirubin: 0.7 mg/dL (ref 0.2–1.2)
Total Protein: 6.8 g/dL (ref 6.1–8.1)
eGFR: 84 mL/min/{1.73_m2} (ref >=60)

## 2022-07-07 LAB — LIPID PANEL
Cholesterol: 138 mg/dL (ref <200)
HDL: 60 mg/dL (ref >=50)
LDL Cholesterol (Calc): 66 mg/dL (calc)
Non-HDL Cholesterol (Calc): 78 mg/dL (calc) (ref <130)
Total CHOL/HDL Ratio: 2.3 (calc) (ref <5.0)
Triglycerides: 50 mg/dL (ref <150)

## 2022-07-07 LAB — TSH: TSH: 1.85 mIU/L (ref 0.40–4.50)

## 2022-07-08 ENCOUNTER — Other Ambulatory Visit: Payer: Self-pay | Admitting: Nurse Practitioner

## 2022-07-08 NOTE — Telephone Encounter (Signed)
Patient medication has High Risk Warnings.

## 2022-07-09 NOTE — Progress Notes (Signed)
Cardiology Office Note:    Date:  07/13/2022   ID:  [name], DOB October 22, 1931, MRN [id number]  PCP:  Mast, Man X, NP   [facility] Providers Cardiologist:  Dorris Carnes, MD     Referring MD: Mast, Man X, NP   Chief Complaint: leg swelling   History of Present Illness:    Carol Wells is a very pleasant 89 y.o. female with a hx of HTN, white coat hypertension,  and aortic atherosclerosis  She had echocardiogram in 2014 which showed grade 2 diastolic dysfunction. Stress echocardiogram was abnormal, suggestive of ischemia. She underwent left heart catheterization February 2014 which revealed no CAD, normal LVEF.  Last cardiology clinic visit was 12/09/2021 with Dr. Harrington Challenger.  She has traditionally gone a few years between office visits. At that visit, she was advised to closely monitor home BP as it was elevated in the office. One year follow-up was recommended.  Today, she is for evaluation of leg swelling. She is here alone today. Her husband passed away in 03/27/2023 and she drove herself here today, has not driven much in the last few years. Moved here 13 years ago from [place], lives at [facility] and enjoys being there. On 05/13/22 started noting RLE swelling in ankle and calf. History of right hip replacement, so she made appointment with ortho. Was told swelling was not caused by hip and was put on prednisone. Was told swelling may be caused by back problems. Exercises 6 days per week at [facility] - sometimes 2 classes per day. Walks 1- 1 1/2 miles on the day she does not attend classes. Only gets short of breath when she is talking while walking which is why she first saw cardiology.  No worsening of symptoms recently. Weight is stable, she avoids high sodium diet. Has been wearing compression stockings. Swelling usually improves overnight but always returns the next day and progresses through the day. She denies chest pain, fatigue, palpitations, melena, hematuria, hemoptysis,  diaphoresis, weakness, presyncope, syncope, orthopnea, and PND.  Past Medical History:  Diagnosis Date   Anemia 11/2015   Arthritis    Blood transfusion 2017 JULY 22   Carotid stenosis    Dopplers [id number]: RICA [id number], LICA [id number]   Cataract    REMOVED   Chronic headaches    HX MIGRAINES   Collagenous colitis    Diverticulosis 2013   Colonoscopy   Gastric ulcer 11/2015   Glaucoma    BOTH EYES   Hiatal hernia    History of spinal stenosis    History of uterine fibroid    Hx of cardiac catheterization    LHC 3/14: no angiographic CAD, EF 65%   Hx of echocardiogram    Echocardiogram 06/21/12: Mild LVH, EF 123456, grade 2 diastolic dysfunction, mild MR   Hyperlipidemia    Hypertension    Hypothyroidism    Internal hemorrhoids 2009   Colonoscopy   Migraines    UTI (urinary tract infection) HX OF    Past Surgical History:  Procedure Laterality Date   BUNIONECTOMY  YRS AGO LEFT   RIGHT CONE AT [facility] 3 YRS AGO   BUNIONECTOMY Left    CATARACT EXTRACTION W/ INTRAOCULAR LENS IMPLANT  03-26-2010   Left   CATARACT EXTRACTION W/ INTRAOCULAR LENS IMPLANT Bilateral 11/2010   COLONOSCOPY  2013   NORMAL    ESOPHAGOGASTRODUODENOSCOPY N/A 11/23/2015   Procedure: ESOPHAGOGASTRODUODENOSCOPY (EGD);  Surgeon: Rogene Houston, MD;  [facility];  Service:  Endoscopy;  Laterality: N/A;   EUS N/A 12/19/2015   Procedure: ESOPHAGEAL ENDOSCOPIC ULTRASOUND (EUS) RADIAL;  Surgeon: Milus Banister, MD;  [facility];  Service: Endoscopy;  Laterality: N/A;   FUNCTIONAL ENDOSCOPIC SINUS SURGERY     HAMMER TOE SURGERY     HAND SURGERY Right 1998   INGUINAL HERNIA REPAIR Left 03/11/2016   Procedure: LEFT INGUINAL HERINA REPAIR WITH MESH;  Surgeon: Donnie Mesa, MD;  Location: [place];  Service: General;  Laterality: Left;   INSERTION OF MESH Left 03/11/2016   Procedure: INSERTION OF MESH;  Surgeon: Donnie Mesa, MD;  Location: [place];  Service: General;  Laterality: Left;   IR RADIOLOGIST  EVAL & MGMT  07/01/2017   LAMINECTOMY  03/2008   LAPAROTOMY N/A 11/11/2017   Procedure: MINI LAPAROTOMY;  Surgeon: Everitt Amber, MD;  [facility];  Service: Gynecology;  Laterality: N/A;   LUMBAR LAMINECTOMY/DECOMPRESSION MICRODISCECTOMY N/A 12/23/2020   Procedure: Laminectomy and Foraminotomy - Lumbar one-two;  Surgeon: Eustace Moore, MD;  Location: [place];  Service: Neurosurgery;  Laterality: N/A;   POLYPECTOMY     REVERSE SHOULDER ARTHROPLASTY Right 10/13/2018   Procedure: REVERSE SHOULDER ARTHROPLASTY;  Surgeon: Justice Britain, MD;  [facility];  Service: Orthopedics;  Laterality: Right;  18mn   ROBOTIC ASSISTED TOTAL HYSTERECTOMY WITH BILATERAL SALPINGO OOPHERECTOMY Bilateral 11/11/2017   Procedure: XI ROBOTIC ASSISTED TOTAL HYSTERECTOMY WITH BILATERAL SALPINGO OOPHORECTOMY FOR UTERUS GREATER THAN 250 GRAMS;  Surgeon: REveritt Amber MD;  [facility];  Service: Gynecology;  Laterality: Bilateral;   TONSILLECTOMY     TOTAL HIP ARTHROPLASTY  2008   Right    Current Medications: Current Meds  Medication Sig   atorvastatin (LIPITOR) 10 MG tablet Take 1 tablet (10 mg total) by mouth daily.   Cholecalciferol (VITAMIN D3 SUPER STRENGTH) 50 MCG (2000 UT) TABS Take 2,000 Units by mouth daily.   clobetasol ointment (TEMOVATE) 0AB-123456789% Apply 1 application topically daily as needed (vaginitis).   estradiol (ESTRACE) 0.5 MG tablet TAKE 1 TABLET BY MOUTH AT BEDTIME   gabapentin (NEURONTIN) 100 MG capsule Take 1 capsule (100 mg total) by mouth 2 (two) times daily.   hydrochlorothiazide (HYDRODIURIL) 12.5 MG tablet TAKE 1 TABLET BY MOUTH EVERY DAY   latanoprost (XALATAN) 0.005 % ophthalmic solution Place 1 drop into both eyes at bedtime.   levothyroxine (SYNTHROID) 125 MCG tablet TAKE 1 TABLET BY MOUTH EVERY DAY   losartan (COZAAR) 100 MG tablet TAKE 1 TABLET BY MOUTH EVERY DAY   Multiple Vitamin (MULTIVITAMIN) tablet Take 1 tablet by mouth daily with lunch.   Turmeric (QC TUMERIC COMPLEX PO)  Take 1,500 Units by mouth 2 (two) times daily.   vitamin C (ASCORBIC ACID) 500 MG tablet Take 500 mg by mouth daily with lunch.     Allergies:   Patient has no known allergies.   Social History   Socioeconomic History   Marital status: Married    Spouse name: Not on file   Number of children: 2   Years of education: Not on file   Highest education level: Not on file  Occupational History   Occupation: Retired RTherapist, sports Tobacco Use   Smoking status: Never   Smokeless tobacco: Never  Vaping Use   Vaping Use: Never used  Substance and Sexual Activity   Alcohol use: Yes    Comment: 1 glass at dinner   Drug use: Never   Sexual activity: Not Currently  Other Topics Concern   Not on  file  Social History Narrative   Diet:  Blank      Do you drink/ eat things with caffeine?  Coffee      Marital status:  Married                             What year were you married ? 1957      Do you live in a house, apartment,assistred living, condo, trailer, etc.)? Apartment-Town House      Is it one or more stories?  One      How many persons live in your home ?  2      Do you have any pets in your home ?(please list) No      Highest Level of education completed: Bachelors Degree      Current or past profession:  RN      Do you exercise?   Yes                           Type & how often  Walking/ Stretching/Daily      ADVANCED DIRECTIVES (Please bring copies)      Do you have a living will? No      Do you have a DNR form?  No                     If not, do you want to discuss one?       Do you have signed POA?HPOA forms?    No             If so, please bring to your appointment      FUNCTIONAL STATUS- To be completed by Spouse / child / Staff       Do you have difficulty bathing or dressing yourself ? No      Do you have difficulty preparing food or eating ? No      Do you have difficulty managing your mediation ? No      Do you have difficulty managing your finances ? No      Do  you have difficulty affording your medication ? No      Social Determinants of Radio broadcast assistant Strain: Not on file  Food Insecurity: Not on file  Transportation Needs: Not on file  Physical Activity: Not on file  Stress: Not on file  Social Connections: Not on file     Family History: The patient's family history includes Anuerysm (age of onset: 69) in her father; Colon cancer (age of onset: 84) in her brother.  ROS:   Please see the history of present illness.    + RLE swelling  All other systems reviewed and are negative.  Labs/Other Studies Reviewed:    The following studies were reviewed today:  LHC 08/01/2012 Impression: 1. No angiographic evidence of CAD 2. Normal LV systolic function  Recent Labs: 07/06/2022: ALT 20; BUN 14; Creat 0.66; Hemoglobin 12.7; Platelets 111; Potassium 4.2; Sodium 138; TSH 1.85  Recent Lipid Panel    Component Value Date/Time   CHOL 138 07/06/2022 0749   TRIG 50 07/06/2022 0749   HDL 60 07/06/2022 0749   CHOLHDL 2.3 07/06/2022 0749   VLDL 12.0 10/06/2012 0939   LDLCALC 66 07/06/2022 0749     Risk Assessment/Calculations:       Physical Exam:    VS:  BP 130/78  Pulse 67   Ht '5\' 7"'$  (1.702 m)   Wt 127 lb 6.4 oz (57.8 kg)   SpO2 95%   BMI 19.95 kg/m     Wt Readings from Last 3 Encounters:  07/13/22 127 lb 6.4 oz (57.8 kg)  04/15/22 126 lb 12.8 oz (57.5 kg)  04/01/22 124 lb (56.2 kg)     GEN:  Appears younger than stated age, Well nourished, well developed in no acute distress HEENT: Normal NECK: No JVD; No carotid bruits CARDIAC: RRR, no murmurs, rubs, gallops RESPIRATORY:  Clear to auscultation without rales, wheezing or rhonchi  ABDOMEN: Soft, non-tender, non-distended MUSCULOSKELETAL:  Mild RLE edema; No deformity. 2+ pedal pulses, equal bilaterally SKIN: Warm and dry NEUROLOGIC:  Alert and oriented x 3 PSYCHIATRIC:  Normal affect   EKG:  EKG is ordered today.  The ekg ordered today demonstrates  normal sinus rhythm at 71 bpm, left anterior fascicular block, no acute change from previous tracing  Diagnoses:    1. Right leg swelling   2. Primary hypertension   3. Hyperlipidemia LDL goal <70   4. Aortic atherosclerosis (HCC)    Assessment and Plan:     Right lower extremity swelling: RLE edema x 2 months that improves with compression. Trace edema noted today, she reports it is typically more pronounced. Improves with compression and overnight with elevation. No evidence of volume overload on exam. No dyspnea, orthopnea, or PND.  She is very active and has not had any period of immobility. We will get vascular ultrasound to rule out DVT. If u/s is negative, would favor prn diuretic for venous insufficiency. Renal function is stable on recent BMP 07/06/22. Advised her to continue leg elevation and low sodium diet.   Hypertension: BP is well controlled today.   Aortic atherosclerosis/Hyperlipidemia LDL goal < 70: Aortic atherosclerosis on chest CT 11/2015. Cardiac catheterization 2014 with no CAD. She denies chest pain, dyspnea, or other symptoms concerning for angina.  No indication for further ischemic evaluation at this time. LDL 66 on 07/06/22. Continue Lipitor.     Disposition: 6 months with Dr. Harrington Challenger or me  Medication Adjustments/Labs and Tests Ordered: Current medicines are reviewed at length with the patient today.  Concerns regarding medicines are outlined above.  Orders Placed This Encounter  Procedures   EKG 12-Lead   VAS [place] LOWER EXTREMITY VENOUS (DVT)   No orders of the defined types were placed in this encounter.   Patient Instructions  Medication Instructions:   Your physician recommends that you continue on your current medications as directed. Please refer to the Current Medication list given to you today.   *If you need a refill on your cardiac medications before your next appointment, please call your pharmacy*   Lab Work:  None ordered.  If you have labs  (blood work) drawn today and your tests are completely normal, you will receive your results only by: [place] (if you have MyChart) OR A paper copy in the mail If you have any lab test that is abnormal or we need to change your treatment, we will call you to review the results.   Testing/Procedures:  Your physician has requested that you have a lower or Right extremity venous duplex. This test is an ultrasound of the veins in the legs or arms. It looks at venous blood flow that carries blood from the heart to the legs or arms. Allow one hour for a Lower Venous exam. Allow thirty minutes for an Upper Venous exam.  There are no restrictions or special instructions.   Follow-Up: At [facility], you and your health needs are our priority.  As part of our continuing mission to provide you with exceptional heart care, we have created designated Provider Care Teams.  These Care Teams include your primary Cardiologist (physician) and Advanced Practice Providers (APPs -  Physician Assistants and Nurse Practitioners) who all work together to provide you with the care you need, when you need it.  We recommend signing up for the patient portal called "MyChart".  Sign up information is provided on this After Visit Summary.  MyChart is used to connect with patients for Virtual Visits (Telemedicine).  Patients are able to view lab/test results, encounter notes, upcoming appointments, etc.  Non-urgent messages can be sent to your provider as well.   To learn more about what you can do with MyChart, go to [email or web address].    Your next appointment:   6 month(s)  Provider:   Dorris Carnes, MD        Signed, Emmaline Life, NP  07/13/2022 2:27 PM    [place]

## 2022-07-13 ENCOUNTER — Ambulatory Visit: Payer: Medicare Other | Attending: Nurse Practitioner | Admitting: Nurse Practitioner

## 2022-07-13 ENCOUNTER — Encounter: Payer: Self-pay | Admitting: Nurse Practitioner

## 2022-07-13 VITALS — BP 130/78 | HR 67 | Ht 67.0 in | Wt 127.4 lb

## 2022-07-13 DIAGNOSIS — I700 Atherosclerosis of aorta: Secondary | ICD-10-CM | POA: Diagnosis not present

## 2022-07-13 DIAGNOSIS — E785 Hyperlipidemia, unspecified: Secondary | ICD-10-CM | POA: Insufficient documentation

## 2022-07-13 DIAGNOSIS — M7989 Other specified soft tissue disorders: Secondary | ICD-10-CM | POA: Insufficient documentation

## 2022-07-13 DIAGNOSIS — I10 Essential (primary) hypertension: Secondary | ICD-10-CM | POA: Insufficient documentation

## 2022-07-13 NOTE — Patient Instructions (Signed)
Medication Instructions:   Your physician recommends that you continue on your current medications as directed. Please refer to the Current Medication list given to you today.   *If you need a refill on your cardiac medications before your next appointment, please call your pharmacy*   Lab Work:  None ordered.  If you have labs (blood work) drawn today and your tests are completely normal, you will receive your results only by: [place] (if you have MyChart) OR A paper copy in the mail If you have any lab test that is abnormal or we need to change your treatment, we will call you to review the results.   Testing/Procedures:  Your physician has requested that you have a lower or Right extremity venous duplex. This test is an ultrasound of the veins in the legs or arms. It looks at venous blood flow that carries blood from the heart to the legs or arms. Allow one hour for a Lower Venous exam. Allow thirty minutes for an Upper Venous exam. There are no restrictions or special instructions.   Follow-Up: At [facility], you and your health needs are our priority.  As part of our continuing mission to provide you with exceptional heart care, we have created designated Provider Care Teams.  These Care Teams include your primary Cardiologist (physician) and Advanced Practice Providers (APPs -  Physician Assistants and Nurse Practitioners) who all work together to provide you with the care you need, when you need it.  We recommend signing up for the patient portal called "MyChart".  Sign up information is provided on this After Visit Summary.  MyChart is used to connect with patients for Virtual Visits (Telemedicine).  Patients are able to view lab/test results, encounter notes, upcoming appointments, etc.  Non-urgent messages can be sent to your provider as well.   To learn more about what you can do with MyChart, go to [email or web address].    Your next appointment:   6  month(s)  Provider:   Dorris Carnes, MD

## 2022-07-15 ENCOUNTER — Encounter: Payer: Self-pay | Admitting: Internal Medicine

## 2022-07-15 ENCOUNTER — Non-Acute Institutional Stay: Payer: Medicare Other | Admitting: Internal Medicine

## 2022-07-15 VITALS — BP 142/90 | HR 79 | Temp 97.8°F | Resp 17 | Ht 61.0 in | Wt 127.1 lb

## 2022-07-15 DIAGNOSIS — K2950 Unspecified chronic gastritis without bleeding: Secondary | ICD-10-CM | POA: Diagnosis not present

## 2022-07-15 DIAGNOSIS — N952 Postmenopausal atrophic vaginitis: Secondary | ICD-10-CM | POA: Diagnosis not present

## 2022-07-15 DIAGNOSIS — M7989 Other specified soft tissue disorders: Secondary | ICD-10-CM

## 2022-07-15 DIAGNOSIS — M542 Cervicalgia: Secondary | ICD-10-CM

## 2022-07-15 DIAGNOSIS — M1990 Unspecified osteoarthritis, unspecified site: Secondary | ICD-10-CM | POA: Diagnosis not present

## 2022-07-15 DIAGNOSIS — I10 Essential (primary) hypertension: Secondary | ICD-10-CM

## 2022-07-15 DIAGNOSIS — E039 Hypothyroidism, unspecified: Secondary | ICD-10-CM

## 2022-07-15 DIAGNOSIS — E785 Hyperlipidemia, unspecified: Secondary | ICD-10-CM

## 2022-07-15 NOTE — Progress Notes (Signed)
Location:  [facility] (12)  Provider:   Code Status: DNR Goals of Care:     07/15/2022   10:11 AM  Advanced Directives  Does Patient Have a Medical Advance Directive? Yes  Type of Advance Directive [place]  Does patient want to make changes to medical advance directive? No - Patient declined     Chief Complaint  Patient presents with   Medical Management of Chronic Issues    Patient is here for 3 month follow up.   Quality Metric Gaps    AWV    HPI: Patient is a 89 y.o. female seen today for medical management of chronic diseases.    Lives in [place] apartment   Acute issues Right leg swelling  Dopplers ordered by Cardiology. She will try Ted hose Elevate legs   Neck Pain  Is manageable Patient is doing  lots of exercises including Aerobic and Tai chi She has reduced her Gabapentin to BID  Her BP is controlled  Recent loss of husband but doing well now  Patient also has a history of , arthritis, hypothyroidism, atrophic vaginitis, hyperlipidemia and hyponatremia Patient also has a history of gastric ulcer, gastritis and collagenous colitis. Had tapered herself off Protonix and budesonide S/p Lumbar Laminectomy in 12/2020 has recovered    Past Medical History:  Diagnosis Date   Anemia 11/2015   Arthritis    Blood transfusion 2017 JULY 22   Carotid stenosis    Dopplers [id number]: RICA [id number], LICA [id number]   Cataract    REMOVED   Chronic headaches    HX MIGRAINES   Collagenous colitis    Diverticulosis 2013   Colonoscopy   Gastric ulcer 11/2015   Glaucoma    BOTH EYES   Hiatal hernia    History of spinal stenosis    History of uterine fibroid    Hx of cardiac catheterization    LHC 3/14: no angiographic CAD, EF 65%   Hx of echocardiogram    Echocardiogram 06/21/12: Mild LVH, EF 123456, grade 2 diastolic dysfunction, mild MR   Hyperlipidemia    Hypertension    Hypothyroidism    Internal  hemorrhoids 2009   Colonoscopy   Migraines    UTI (urinary tract infection) HX OF    Past Surgical History:  Procedure Laterality Date   BUNIONECTOMY  YRS AGO LEFT   RIGHT [facility] 3 YRS AGO   BUNIONECTOMY Left    CATARACT EXTRACTION W/ INTRAOCULAR LENS IMPLANT  03/2010   Left   CATARACT EXTRACTION W/ INTRAOCULAR LENS IMPLANT Bilateral 11/2010   COLONOSCOPY  2013   NORMAL    ESOPHAGOGASTRODUODENOSCOPY N/A 11/23/2015   Procedure: ESOPHAGOGASTRODUODENOSCOPY (EGD);  Surgeon: Rogene Houston, MD;  [facility];  Service: Endoscopy;  Laterality: N/A;   EUS N/A 12/19/2015   Procedure: ESOPHAGEAL ENDOSCOPIC ULTRASOUND (EUS) RADIAL;  Surgeon: Milus Banister, MD;  Location: [facility];  Service: Endoscopy;  Laterality: N/A;   FUNCTIONAL ENDOSCOPIC SINUS SURGERY     HAMMER TOE SURGERY     HAND SURGERY Right 1998   INGUINAL HERNIA REPAIR Left 03/11/2016   Procedure: LEFT INGUINAL HERINA REPAIR WITH MESH;  Surgeon: Donnie Mesa, MD;  Location: [place] [facility];  Service: General;  Laterality: Left;   INSERTION OF MESH Left 03/11/2016   Procedure: INSERTION OF MESH;  Surgeon: Donnie Mesa, MD;  Location: [place];  Service: General;  Laterality: Left;   IR  RADIOLOGIST EVAL & MGMT  07/01/2017   LAMINECTOMY  03/2008   LAPAROTOMY N/A 11/11/2017   Procedure: MINI LAPAROTOMY;  Surgeon: Everitt Amber, MD;  Location: [facility];  Service: Gynecology;  Laterality: N/A;   LUMBAR LAMINECTOMY/DECOMPRESSION MICRODISCECTOMY N/A 12/23/2020   Procedure: Laminectomy and Foraminotomy - Lumbar one-two;  Surgeon: Eustace Moore, MD;  Location: [place];  Service: Neurosurgery;  Laterality: N/A;   POLYPECTOMY     REVERSE SHOULDER ARTHROPLASTY Right 10/13/2018   Procedure: REVERSE SHOULDER ARTHROPLASTY;  Surgeon: Justice Britain, MD;  Location: [facility];  Service: Orthopedics;  Laterality: Right;  167mn   ROBOTIC ASSISTED TOTAL HYSTERECTOMY WITH BILATERAL SALPINGO OOPHERECTOMY Bilateral 11/11/2017   Procedure: XI ROBOTIC  ASSISTED TOTAL HYSTERECTOMY WITH BILATERAL SALPINGO OOPHORECTOMY FOR UTERUS GREATER THAN 250 GRAMS;  Surgeon: REveritt Amber MD;  Location: [facility];  Service: Gynecology;  Laterality: Bilateral;   TONSILLECTOMY     TOTAL HIP ARTHROPLASTY  2008   Right    No Known Allergies  Outpatient Encounter Medications as of 07/15/2022  Medication Sig   atorvastatin (LIPITOR) 10 MG tablet Take 1 tablet (10 mg total) by mouth daily.   Cholecalciferol (VITAMIN D3 SUPER STRENGTH) 50 MCG (2000 UT) TABS Take 2,000 Units by mouth daily.   clobetasol ointment (TEMOVATE) 0AB-123456789% Apply 1 application topically daily as needed (vaginitis).   estradiol (ESTRACE) 0.5 MG tablet TAKE 1 TABLET BY MOUTH AT BEDTIME   gabapentin (NEURONTIN) 100 MG capsule Take 1 capsule (100 mg total) by mouth 2 (two) times daily.   hydrochlorothiazide (HYDRODIURIL) 12.5 MG tablet TAKE 1 TABLET BY MOUTH EVERY DAY   latanoprost (XALATAN) 0.005 % ophthalmic solution Place 1 drop into both eyes at bedtime.   levothyroxine (SYNTHROID) 125 MCG tablet TAKE 1 TABLET BY MOUTH EVERY DAY   losartan (COZAAR) 100 MG tablet TAKE 1 TABLET BY MOUTH EVERY DAY   Multiple Vitamin (MULTIVITAMIN) tablet Take 1 tablet by mouth daily with lunch.   Turmeric (QC TUMERIC COMPLEX PO) Take 1,500 Units by mouth 2 (two) times daily.   vitamin C (ASCORBIC ACID) 500 MG tablet Take 500 mg by mouth daily with lunch.   No facility-administered encounter medications on file as of 07/15/2022.    Review of Systems:  Review of Systems  Constitutional:  Negative for activity change and appetite change.  HENT: Negative.    Respiratory:  Negative for cough and shortness of breath.   Cardiovascular:  Positive for leg swelling.  Gastrointestinal:  Negative for constipation.  Genitourinary: Negative.   Musculoskeletal:  Negative for arthralgias, gait problem and myalgias.  Skin: Negative.   Neurological:  Negative for dizziness and weakness.  Psychiatric/Behavioral:  Negative  for confusion, dysphoric mood and sleep disturbance.     Health Maintenance  Topic Date Due   Zoster Vaccines- Shingrix (1 of 2) Never done   Medicare Annual Wellness (AWV)  11/07/2019   COVID-19 Vaccine (4 - 2023-24 season) 01/02/2022   DTaP/Tdap/Td (2 - Td or Tdap) 06/13/2030   Pneumonia Vaccine 89 Years old  Completed   INFLUENZA VACCINE  Completed   DEXA SCAN  Completed   HPV VACCINES  Aged Out    Physical Exam: Vitals:   07/15/22 1007  BP: (!) 142/90  Pulse: 79  Resp: 17  Temp: 97.8 F (36.6 C)  TempSrc: Temporal  SpO2: 96%  Weight: 127 lb 1.6 oz (57.7 kg)  Height: '5\' 1"'$  (1.549 m)   Body mass index is 24.02 kg/m. Physical Exam Vitals reviewed.  Constitutional:  Appearance: Normal appearance.  HENT:     Head: Normocephalic.     Nose: Nose normal.     Mouth/Throat:     Mouth: Mucous membranes are moist.     Pharynx: Oropharynx is clear.  Eyes:     Pupils: Pupils are equal, round, and reactive to light.  Cardiovascular:     Rate and Rhythm: Normal rate and regular rhythm.     Pulses: Normal pulses.     Heart sounds: Normal heart sounds. No murmur heard. Pulmonary:     Effort: Pulmonary effort is normal.     Breath sounds: Normal breath sounds.  Abdominal:     General: Abdomen is flat. Bowel sounds are normal.     Palpations: Abdomen is soft.  Musculoskeletal:     Cervical back: Neck supple.     Comments: Mild swelling in her Lower Extremity Right more then left  Skin:    General: Skin is warm.  Neurological:     General: No focal deficit present.     Mental Status: She is alert and oriented to person, place, and time.  Psychiatric:        Mood and Affect: Mood normal.        Thought Content: Thought content normal.     Labs reviewed: Basic Metabolic Panel: Recent Labs    02/19/22 0000 02/19/22 0715 07/06/22 0749  NA  --  138 138  K  --  4.2 4.2  CL  --  101 100  CO2  --  31 27  GLUCOSE  --  80 77  BUN  --  18 14  CREATININE  --   0.68 0.66  CALCIUM  --  9.1 8.9  TSH 0.82 0.82 1.85   Liver Function Tests: Recent Labs    02/19/22 0715 07/06/22 0749  AST 26 30  ALT 18 20  BILITOT 0.7 0.7  PROT 7.2 6.8   No results for input(s): "LIPASE", "AMYLASE" in the last 8760 hours. No results for input(s): "AMMONIA" in the last 8760 hours. CBC: Recent Labs    07/06/22 0749  WBC 5.1  NEUTROABS 2,795  HGB 12.7  HCT 36.9  MCV 87.4  PLT 111*   Lipid Panel: Recent Labs    02/19/22 0715 07/06/22 0749  CHOL 148 138  HDL 58 60  LDLCALC 76 66  TRIG 61 50  CHOLHDL 2.6 2.3   No results found for: "HGBA1C"  Procedures since last visit: No results found.  Assessment/Plan 1. Right leg swelling Doppler pending Elevate legs Ted hoses and reduced salt intake  2. Primary hypertension Controlled  On HCTZ , Losartan,   3. Hypothyroidism, unspecified type TSH normal  4. Hyperlipidemia, unspecified hyperlipidemia type On statin  5. Chronic gastritis without bleeding, unspecified gastritis type Takes Protonix PRn  6. Atrophic vaginitis Estrace  7. Osteoarthritis, unspecified osteoarthritis type, unspecified site Tylenol   8. Neck pain Gabapentin    Labs/tests ordered:   Next appt:  08/03/2022

## 2022-07-20 ENCOUNTER — Ambulatory Visit (HOSPITAL_COMMUNITY)
Admission: RE | Admit: 2022-07-20 | Discharge: 2022-07-20 | Disposition: A | Discharge status: Home / Self Care | Payer: Medicare Other | Source: Ambulatory Visit | Attending: Internal Medicine | Admitting: Internal Medicine

## 2022-07-20 DIAGNOSIS — M7989 Other specified soft tissue disorders: Secondary | ICD-10-CM | POA: Insufficient documentation

## 2022-07-20 DIAGNOSIS — I10 Essential (primary) hypertension: Secondary | ICD-10-CM | POA: Diagnosis not present

## 2022-07-21 ENCOUNTER — Other Ambulatory Visit: Payer: Self-pay | Admitting: Nurse Practitioner

## 2022-07-29 ENCOUNTER — Ambulatory Visit
Admission: RE | Admit: 2022-07-29 | Discharge: 2022-07-29 | Disposition: A | Discharge status: Home / Self Care | Payer: Medicare Other | Source: Ambulatory Visit | Attending: Internal Medicine | Admitting: Internal Medicine

## 2022-07-29 DIAGNOSIS — Z1231 Encounter for screening mammogram for malignant neoplasm of breast: Secondary | ICD-10-CM

## 2022-08-03 ENCOUNTER — Non-Acute Institutional Stay (INDEPENDENT_AMBULATORY_CARE_PROVIDER_SITE_OTHER): Payer: Medicare Other | Admitting: Orthopedic Surgery

## 2022-08-03 ENCOUNTER — Encounter: Payer: Self-pay | Admitting: Orthopedic Surgery

## 2022-08-03 VITALS — BP 134/86 | HR 77 | Temp 97.0°F | Resp 16 | Ht 61.0 in | Wt 127.0 lb

## 2022-08-03 DIAGNOSIS — Z0000 Encounter for general adult medical examination without abnormal findings: Secondary | ICD-10-CM | POA: Diagnosis not present

## 2022-08-03 NOTE — Progress Notes (Signed)
Subjective:   Carol Wells is a 89 y.o. female who presents for Medicare Annual (Subsequent) preventive examination.  Place of [facility] Provider: Windell Moulding, AGNP-C   Review of Systems     Cardiac Risk Factors include: advanced age (>54men, >26 women)     Objective:    Today's Vitals   08/03/22 1441  BP: 134/86  Pulse: 77  Resp: 16  Temp: (!) 97 F (36.1 C)  SpO2: 94%  Weight: 127 lb (57.6 kg)  Height: 5\' 1"  (1.549 m)   Body mass index is 24 kg/m.     07/15/2022   10:11 AM 04/15/2022    9:29 AM 04/01/2022   10:48 AM 02/25/2022    1:02 PM 12/04/2021    9:26 AM 12/12/2020   10:29 AM 09/05/2020    1:26 PM  Advanced Directives  Does Patient Have a Medical Advance Directive? Yes Yes Yes Yes No No Yes  Type of Industrial/product designer of [employer] Power of [place]  Does patient want to make changes to medical advance directive? No - Patient declined  No - Patient declined    No - Patient declined  Copy of [place] in Chart?  Yes - validated most recent copy scanned in chart (See row information) Yes - validated most recent copy scanned in chart (See row information) Yes - validated most recent copy scanned in chart (See row information)   Yes - validated most recent copy scanned in chart (See row information)  Would patient like information on creating a medical advance directive?     No - Patient declined Yes (MAU/Ambulatory/Procedural Areas - Information given)     Current Medications (verified) Outpatient Encounter Medications as of 08/03/2022  Medication Sig   atorvastatin (LIPITOR) 10 MG tablet Take 1 tablet (10 mg total) by mouth daily.   Cholecalciferol (VITAMIN D3 SUPER STRENGTH) 50 MCG (2000 UT) TABS Take 2,000 Units by mouth daily.   clobetasol ointment (TEMOVATE) [id number] % Apply 1 application topically daily as needed  (vaginitis).   estradiol (ESTRACE) 0.5 MG tablet TAKE 1 TABLET BY MOUTH AT BEDTIME   gabapentin (NEURONTIN) 100 MG capsule Take 1 capsule (100 mg total) by mouth 2 (two) times daily.   hydrochlorothiazide (HYDRODIURIL) 12.5 MG tablet TAKE 1 TABLET BY MOUTH EVERY DAY   latanoprost (XALATAN) 0.005 % ophthalmic solution Place 1 drop into both eyes at bedtime.   levothyroxine (SYNTHROID) 125 MCG tablet TAKE 1 TABLET BY MOUTH EVERY DAY   losartan (COZAAR) 100 MG tablet TAKE 1 TABLET BY MOUTH EVERY DAY   Multiple Vitamin (MULTIVITAMIN) tablet Take 1 tablet by mouth daily with lunch.   Turmeric (QC TUMERIC COMPLEX PO) Take 1,500 Units by mouth 2 (two) times daily.   vitamin C (ASCORBIC ACID) 500 MG tablet Take 500 mg by mouth daily with lunch.   No facility-administered encounter medications on file as of 08/03/2022.    Allergies (verified) Patient has no known allergies.   History: Past Medical History:  Diagnosis Date   Anemia 11/2015   Arthritis    Blood transfusion 2017 JULY 22   Carotid stenosis    Dopplers [id number]: RICA 123456, LICA [id number]   Cataract    REMOVED   Chronic headaches    HX MIGRAINES   Collagenous colitis    Diverticulosis 2013   Colonoscopy   Gastric ulcer 11/2015   Glaucoma  BOTH EYES   Hiatal hernia    History of spinal stenosis    History of uterine fibroid    Hx of cardiac catheterization    LHC 3/14: no angiographic CAD, EF 65%   Hx of echocardiogram    Echocardiogram 06/21/12: Mild LVH, EF 123456, grade 2 diastolic dysfunction, mild MR   Hyperlipidemia    Hypertension    Hypothyroidism    Internal hemorrhoids 2009   Colonoscopy   Migraines    UTI (urinary tract infection) HX OF   Past Surgical History:  Procedure Laterality Date   BREAST BIOPSY Right    BUNIONECTOMY  YRS AGO LEFT   RIGHT [facility] 3 YRS AGO   BUNIONECTOMY Left    CATARACT EXTRACTION W/ INTRAOCULAR LENS IMPLANT  03/2010   Left   CATARACT EXTRACTION W/ INTRAOCULAR LENS IMPLANT  Bilateral 11/2010   COLONOSCOPY  2013   NORMAL    ESOPHAGOGASTRODUODENOSCOPY N/A 11/23/2015   Procedure: ESOPHAGOGASTRODUODENOSCOPY (EGD);  Surgeon: Rogene Houston, MD;  [facility];  Service: Endoscopy;  Laterality: N/A;   EUS N/A 12/19/2015   Procedure: ESOPHAGEAL ENDOSCOPIC ULTRASOUND (EUS) RADIAL;  Surgeon: Milus Banister, MD;  Location: [facility];  Service: Endoscopy;  Laterality: N/A;   FUNCTIONAL ENDOSCOPIC SINUS SURGERY     HAMMER TOE SURGERY     HAND SURGERY Right 1998   INGUINAL HERNIA REPAIR Left 03/11/2016   Procedure: LEFT INGUINAL HERINA REPAIR WITH MESH;  Surgeon: Donnie Mesa, MD;  Location: [place];  Service: General;  Laterality: Left;   INSERTION OF MESH Left 03/11/2016   Procedure: INSERTION OF MESH;  Surgeon: Donnie Mesa, MD;  Location: [place];  Service: General;  Laterality: Left;   IR RADIOLOGIST EVAL & MGMT  07/01/2017   LAMINECTOMY  03/2008   LAPAROTOMY N/A 11/11/2017   Procedure: MINI LAPAROTOMY;  Surgeon: Everitt Amber, MD;  [facility];  Service: Gynecology;  Laterality: N/A;   LUMBAR LAMINECTOMY/DECOMPRESSION MICRODISCECTOMY N/A 12/23/2020   Procedure: Laminectomy and Foraminotomy - Lumbar one-two;  Surgeon: Eustace Moore, MD;  Location: [place];  Service: Neurosurgery;  Laterality: N/A;   POLYPECTOMY     REVERSE SHOULDER ARTHROPLASTY Right 10/13/2018   Procedure: REVERSE SHOULDER ARTHROPLASTY;  Surgeon: Justice Britain, MD;  [facility];  Service: Orthopedics;  Laterality: Right;  114min   ROBOTIC ASSISTED TOTAL HYSTERECTOMY WITH BILATERAL SALPINGO OOPHERECTOMY Bilateral 11/11/2017   Procedure: XI ROBOTIC ASSISTED TOTAL HYSTERECTOMY WITH BILATERAL SALPINGO OOPHORECTOMY FOR UTERUS GREATER THAN 250 GRAMS;  Surgeon: Everitt Amber, MD;  [facility];  Service: Gynecology;  Laterality: Bilateral;   TONSILLECTOMY     TOTAL HIP ARTHROPLASTY  2008   Right   Family History  Problem Relation Age of Onset   Colon cancer Brother 37   Anuerysm  Father 74   Social History   Socioeconomic History   Marital status: Married    Spouse name: Not on file   Number of children: 2   Years of education: Not on file   Highest education level: Not on file  Occupational History   Occupation: Retired Therapist, sports  Tobacco Use   Smoking status: Never   Smokeless tobacco: Never  Vaping Use   Vaping Use: Never used  Substance and Sexual Activity   Alcohol use: Yes    Comment: 1 glass at dinner   Drug use: Never   Sexual activity: Not Currently  Other Topics Concern   Not on file  Social History Narrative   Diet:  Blank      Do you drink/ eat things with caffeine?  Coffee      Marital status:  Married                             What year were you married ? 1957      Do you live in a house, apartment,assistred living, condo, trailer, etc.)? Apartment-Town House      Is it one or more stories?  One      How many persons live in your home ?  2      Do you have any pets in your home ?(please list) No      Highest Level of education completed: Bachelors Degree      Current or past profession:  RN      Do you exercise?   Yes                           Type & how often  Walking/ Stretching/Daily      ADVANCED DIRECTIVES (Please bring copies)      Do you have a living will? No      Do you have a DNR form?  No                     If not, do you want to discuss one?       Do you have signed POA?HPOA forms?    No             If so, please bring to your appointment      FUNCTIONAL STATUS- To be completed by Spouse / child / Staff       Do you have difficulty bathing or dressing yourself ? No      Do you have difficulty preparing food or eating ? No      Do you have difficulty managing your mediation ? No      Do you have difficulty managing your finances ? No      Do you have difficulty affording your medication ? No      Social Determinants of Health   Financial Resource Strain: Low Risk  (08/03/2022)   Overall Financial Resource  Strain (CARDIA)    Difficulty of Paying Living Expenses: Not hard at all  Food Insecurity: No Food Insecurity (08/03/2022)   Hunger Vital Sign    Worried About Running Out of Food in the Last Year: Never true    Ran Out of Food in the Last Year: Never true  Transportation Needs: No Transportation Needs (08/03/2022)   PRAPARE - Hydrologist (Medical): No    Lack of Transportation (Non-Medical): No  Physical Activity: Sufficiently Active (08/03/2022)   Exercise Vital Sign    Days of Exercise per Week: 7 days    Minutes of Exercise per Session: 40 min  Stress: No Stress Concern Present (08/03/2022)   [place]    Feeling of Stress : Only a little  Social Connections: Socially Isolated (08/03/2022)   Social Connection and Isolation Panel [NHANES]    Frequency of Communication with Friends and Family: More than three times a week    Frequency of Social Gatherings with Friends and Family: Once a week    Attends Religious Services: Never    Marine scientist or Organizations:  No    Attends Archivist Meetings: Never    Marital Status: Widowed    Tobacco Counseling Counseling given: Not Answered   Clinical Intake:  Pre-visit preparation completed: No  Pain : No/denies pain     BMI - recorded: 24 Nutritional Status: BMI of 19-24  Normal Nutritional Risks: None Diabetes: No  How often do you need to have someone help you when you read instructions, pamphlets, or other written materials from your doctor or pharmacy?: 1 - Never What is the last grade level you completed in school?: [employer]  Diabetic?No  Interpreter Needed?: No      Activities of Daily Living    08/03/2022    2:54 PM  In your present state of health, do you have any difficulty performing the following activities:  Hearing? 0  Difficulty concentrating or making decisions? 0  Walking or climbing stairs? 0   Dressing or bathing? 0  Doing errands, shopping? 0  Preparing Food and eating ? N  Using the Toilet? N  In the past six months, have you accidently leaked urine? N  Do you have problems with loss of bowel control? N  Managing your Medications? N  Managing your Finances? N  Housekeeping or managing your Housekeeping? N    Patient Care Team: Virgie Dad, MD as PCP - General (Internal Medicine) Fay Records, MD as PCP - Cardiology (Cardiology)  Indicate any recent Medical Services you may have received from other than [facility] providers in the past year (date may be approximate).     Assessment:   This is a routine wellness examination for Haaniya.  Hearing/Vision screen No results found.  Dietary issues and exercise activities discussed: Current Exercise Habits: Structured exercise class, Type of exercise: strength training/weights;stretching;walking, Time (Minutes): 40, Frequency (Times/Week): 7, Weekly Exercise (Minutes/Week): 280, Intensity: Moderate, Exercise limited by: orthopedic condition(s)   Goals Addressed             This Visit's Progress    Maintain Mobility and Function   On track    Evidence-based guidance:  Acknowledge and validate impact of pain, loss of strength and potential disfigurement (hand osteoarthritis) on mental health and daily life, such as social isolation, anxiety, depression, impaired sexual relationship and   injury from falls.  Anticipate referral to physical or occupational therapy for assessment, therapeutic exercise and recommendation for adaptive equipment or assistive devices; encourage participation.  Assess impact on ability to perform activities of daily living, as well as engage in sports and leisure events or requirements of work or school.  Provide anticipatory guidance and reassurance about the benefit of exercise to maintain function; acknowledge and normalize fear that exercise may worsen symptoms.  Encourage regular exercise, at  least 10 minutes at a time for 45 minutes per week; consider yoga, water exercise and proprioceptive exercises; encourage use of wearable activity tracker to increase motivation and adherence.  Encourage maintenance or resumption of daily activities, including employment, as pain allows and with minimal exposure to trauma.  Assist patient to advocate for adaptations to the work environment.  Consider level of pain and function, gender, age, lifestyle, patient preference, quality of life, readiness and ?ocapacity to benefit? when recommending patients for orthopaedic surgery consultation.  Explore strategies, such as changes to medication regimen or activity that enables patient to anticipate and manage flare-ups that increase deconditioning and disability.  Explore patient preferences; encourage exposure to a broader range of activities that have been avoided for fear of experiencing pain.  Identify barriers to participation in therapy or exercise, such as pain with activity, anticipated or imagined pain.  Monitor postoperative joint replacement or any preexisting joint replacement for ongoing pain and loss of function; provide social support and encouragement throughout recovery.   Notes:        Depression Screen    07/15/2022   10:11 AM 02/25/2022    1:02 PM  PHQ 2/9 Scores  PHQ - 2 Score 0 0    Fall Risk    08/03/2022    2:54 PM 07/15/2022   10:10 AM 04/15/2022    9:28 AM 04/01/2022   10:48 AM 02/25/2022    1:02 PM  Fall Risk   Falls in the past year? 0 0 0 0 0  Number falls in past yr: 0 0 0 0 0  Injury with Fall? 0 0 0 0 0  Risk for fall due to : History of fall(s);Impaired balance/gait;Impaired mobility No Fall Risks History of fall(s) History of fall(s) History of fall(s)  Follow up Falls evaluation completed;Education provided;Falls prevention discussed  Falls evaluation completed Falls evaluation completed Falls evaluation completed    FALL RISK PREVENTION PERTAINING TO  THE HOME:  Any stairs in or around the home? No  If so, are there any without handrails? No  Home free of loose throw rugs in walkways, pet beds, electrical cords, etc? Yes  Adequate lighting in your home to reduce risk of falls? Yes   ASSISTIVE DEVICES UTILIZED TO PREVENT FALLS:  Life alert? No  Use of a cane, walker or w/c? No  Grab bars in the bathroom? Yes  Shower chair or bench in shower? Yes  Elevated toilet seat or a handicapped toilet? Yes   TIMED UP AND GO:  Was the test performed? No .  Length of time to ambulate 10 feet: N/A sec.   Gait slow and steady without use of assistive device  Cognitive Function:    08/03/2022    2:57 PM  MMSE - Mini Mental State Exam  Orientation to time 5  Orientation to Place 5  Registration 3  Attention/ Calculation 5  Recall 3  Language- name 2 objects 2  Language- repeat 1  Language- follow 3 step command 3  Language- read & follow direction 1  Write a sentence 1  Copy design 1  Total score 30        Immunizations Immunization History  Administered Date(s) Administered   Influenza, High Dose Seasonal PF 03/02/2018, 03/04/2019, 02/17/2021   Influenza,inj,quad, With Preservative 03/04/2018, 03/04/2019   Influenza-Unspecified 01/02/2013, 01/03/2015, 02/01/2018, 02/14/2020, 02/17/2022   PFIZER(Purple Top)SARS-COV-2 Vaccination 05/28/2019, 06/19/2019, 04/11/2020   Pneumococcal Conjugate-13 03/16/2016   Pneumococcal Polysaccharide-23 11/07/2018   Tdap 06/13/2020   Zoster, Live 03/16/2016    TDAP status: Up to date  Flu Vaccine status: Up to date  Pneumococcal vaccine status: Up to date  Covid-19 vaccine status: Completed vaccines  Qualifies for Shingles Vaccine? Yes   Zostavax completed Yes   Shingrix Completed?: No.    Education has been provided regarding the importance of this vaccine. Patient has been advised to call insurance company to determine out of pocket expense if they have not yet received this vaccine.  Advised may also receive vaccine at local pharmacy or Health Dept. Verbalized acceptance and understanding.  Screening Tests Health Maintenance  Topic Date Due   Zoster Vaccines- Shingrix (1 of 2) Never done   COVID-19 Vaccine (4 - 2023-24 season) 01/02/2022   INFLUENZA VACCINE  12/03/2022  Medicare Annual Wellness (AWV)  08/03/2023   DTaP/Tdap/Td (2 - Td or Tdap) 06/13/2030   Pneumonia Vaccine 28+ Years old  Completed   DEXA SCAN  Completed   HPV VACCINES  Aged Out    Health Maintenance  Health Maintenance Due  Topic Date Due   Zoster Vaccines- Shingrix (1 of 2) Never done   COVID-19 Vaccine (4 - 2023-24 season) 01/02/2022    Colorectal cancer screening: No longer required.   Mammogram status: Completed 07/30/2022. Repeat every year  Bone Density status: Completed 2016. Results reflect: Bone density results: OSTEOPENIA. Repeat every 2> scheduled 11/13/2022 years.  Lung Cancer Screening: (Low Dose CT Chest recommended if Age 27-80 years, 30 pack-year currently smoking OR have quit w/in 15years.) does not qualify.   Lung Cancer Screening Referral: No  Additional Screening:  Hepatitis C Screening: does not qualify; Completed   Vision Screening: Recommended annual ophthalmology exams for early detection of glaucoma and other disorders of the eye. Is the patient up to date with their annual eye exam?  Yes  Who is the provider or what is the name of the office in which the patient attends annual eye exams? Dr. Prudencio Burly If pt is not established with a provider, would they like to be referred to a provider to establish care? No .   Dental Screening: Recommended annual dental exams for proper oral hygiene  Community Resource Referral / Chronic Care Management: CRR required this visit?  No   CCM required this visit?  No      Plan:     I have personally reviewed and noted the following in the patient's chart:   Medical and social history Use of alcohol, tobacco or illicit  drugs  Current medications and supplements including opioid prescriptions. Patient is not currently taking opioid prescriptions. Functional ability and status Nutritional status Physical activity Advanced directives List of other physicians Hospitalizations, surgeries, and ER visits in previous 12 months Vitals Screenings to include cognitive, depression, and falls Referrals and appointments  In addition, I have reviewed and discussed with patient certain preventive protocols, quality metrics, and best practice recommendations. A written personalized care plan for preventive services as well as general preventive health recommendations were provided to patient.     Yvonna Alanis, NP   08/03/2022   Nurse Notes: Bone density scheduled 11/13/2022. Not interested in Shingrix vaccine at this time.

## 2022-08-03 NOTE — Patient Instructions (Signed)
  Ms. Jacquart , Thank you for taking time to come for your Medicare Wellness Visit. I appreciate your ongoing commitment to your health goals. Please review the following plan we discussed and let me know if I can assist you in the future.   These are the goals we discussed:  Goals      Maintain Mobility and Function     Evidence-based guidance:  Acknowledge and validate impact of pain, loss of strength and potential disfigurement (hand osteoarthritis) on mental health and daily life, such as social isolation, anxiety, depression, impaired sexual relationship and   injury from falls.  Anticipate referral to physical or occupational therapy for assessment, therapeutic exercise and recommendation for adaptive equipment or assistive devices; encourage participation.  Assess impact on ability to perform activities of daily living, as well as engage in sports and leisure events or requirements of work or school.  Provide anticipatory guidance and reassurance about the benefit of exercise to maintain function; acknowledge and normalize fear that exercise may worsen symptoms.  Encourage regular exercise, at least 10 minutes at a time for 45 minutes per week; consider yoga, water exercise and proprioceptive exercises; encourage use of wearable activity tracker to increase motivation and adherence.  Encourage maintenance or resumption of daily activities, including employment, as pain allows and with minimal exposure to trauma.  Assist patient to advocate for adaptations to the work environment.  Consider level of pain and function, gender, age, lifestyle, patient preference, quality of life, readiness and ?ocapacity to benefit? when recommending patients for orthopaedic surgery consultation.  Explore strategies, such as changes to medication regimen or activity that enables patient to anticipate and manage flare-ups that increase deconditioning and disability.  Explore patient preferences; encourage  exposure to a broader range of activities that have been avoided for fear of experiencing pain.  Identify barriers to participation in therapy or exercise, such as pain with activity, anticipated or imagined pain.  Monitor postoperative joint replacement or any preexisting joint replacement for ongoing pain and loss of function; provide social support and encouragement throughout recovery.   Notes:         This is a list of the screening recommended for you and due dates:  Health Maintenance  Topic Date Due   Zoster (Shingles) Vaccine (1 of 2) Never done   COVID-19 Vaccine (4 - 2023-24 season) 01/02/2022   Flu Shot  12/03/2022   Medicare Annual Wellness Visit  08/03/2023   DTaP/Tdap/Td vaccine (2 - Td or Tdap) 06/13/2030   Pneumonia Vaccine  Completed   DEXA scan (bone density measurement)  Completed   HPV Vaccine  Aged Out

## 2022-08-05 ENCOUNTER — Other Ambulatory Visit: Payer: Self-pay | Admitting: Nurse Practitioner

## 2022-08-14 DIAGNOSIS — Z96641 Presence of right artificial hip joint: Secondary | ICD-10-CM | POA: Diagnosis not present

## 2022-08-14 DIAGNOSIS — M25551 Pain in right hip: Secondary | ICD-10-CM | POA: Diagnosis not present

## 2022-08-20 ENCOUNTER — Other Ambulatory Visit: Payer: Medicare Other

## 2022-08-22 DIAGNOSIS — S82254A Nondisplaced comminuted fracture of shaft of right tibia, initial encounter for closed fracture: Secondary | ICD-10-CM | POA: Diagnosis not present

## 2022-08-24 DIAGNOSIS — M25561 Pain in right knee: Secondary | ICD-10-CM | POA: Diagnosis not present

## 2022-08-26 ENCOUNTER — Encounter: Payer: Medicare Other | Admitting: Internal Medicine

## 2022-08-31 DIAGNOSIS — M25561 Pain in right knee: Secondary | ICD-10-CM | POA: Diagnosis not present

## 2022-10-02 DIAGNOSIS — N8110 Cystocele, unspecified: Secondary | ICD-10-CM | POA: Diagnosis not present

## 2022-10-02 DIAGNOSIS — N816 Rectocele: Secondary | ICD-10-CM | POA: Diagnosis not present

## 2022-10-26 DIAGNOSIS — H401431 Capsular glaucoma with pseudoexfoliation of lens, bilateral, mild stage: Secondary | ICD-10-CM | POA: Diagnosis not present

## 2022-10-28 ENCOUNTER — Other Ambulatory Visit: Payer: Self-pay | Admitting: Internal Medicine

## 2022-11-13 ENCOUNTER — Ambulatory Visit
Admission: RE | Admit: 2022-11-13 | Discharge: 2022-11-13 | Disposition: A | Discharge status: Home / Self Care | Payer: Medicare Other | Source: Ambulatory Visit | Attending: Internal Medicine | Admitting: Internal Medicine

## 2022-11-13 DIAGNOSIS — E2839 Other primary ovarian failure: Secondary | ICD-10-CM

## 2022-11-13 DIAGNOSIS — E349 Endocrine disorder, unspecified: Secondary | ICD-10-CM | POA: Diagnosis not present

## 2022-11-13 DIAGNOSIS — M8588 Other specified disorders of bone density and structure, other site: Secondary | ICD-10-CM | POA: Diagnosis not present

## 2022-11-13 DIAGNOSIS — N958 Other specified menopausal and perimenopausal disorders: Secondary | ICD-10-CM | POA: Diagnosis not present

## 2022-11-18 DIAGNOSIS — Z96641 Presence of right artificial hip joint: Secondary | ICD-10-CM | POA: Diagnosis not present

## 2022-11-21 ENCOUNTER — Other Ambulatory Visit: Payer: Self-pay | Admitting: Nurse Practitioner

## 2022-11-25 ENCOUNTER — Non-Acute Institutional Stay: Payer: Medicare Other | Admitting: Internal Medicine

## 2022-11-25 ENCOUNTER — Encounter: Payer: Self-pay | Admitting: Internal Medicine

## 2022-11-25 VITALS — BP 140/86 | HR 74 | Temp 97.6°F | Resp 16 | Ht 61.0 in | Wt 121.1 lb

## 2022-11-25 DIAGNOSIS — M542 Cervicalgia: Secondary | ICD-10-CM | POA: Diagnosis not present

## 2022-11-25 DIAGNOSIS — E785 Hyperlipidemia, unspecified: Secondary | ICD-10-CM | POA: Diagnosis not present

## 2022-11-25 DIAGNOSIS — E039 Hypothyroidism, unspecified: Secondary | ICD-10-CM

## 2022-11-25 DIAGNOSIS — I10 Essential (primary) hypertension: Secondary | ICD-10-CM | POA: Diagnosis not present

## 2022-11-25 DIAGNOSIS — M7989 Other specified soft tissue disorders: Secondary | ICD-10-CM | POA: Diagnosis not present

## 2022-11-25 DIAGNOSIS — F329 Major depressive disorder, single episode, unspecified: Secondary | ICD-10-CM | POA: Diagnosis not present

## 2022-11-26 NOTE — Progress Notes (Signed)
[facility]: [facility] Service:  Clinic (12)  Provider:   Code Status:  Goals of Care:     11/25/2022   10:21 AM  Advanced Directives  Does Patient Have a Medical Advance Directive? Yes  Type of Advance Directive Healthcare Power of Attorney  Does patient want to make changes to medical advance directive? No - Patient declined  Copy of Healthcare Power of Attorney in Chart? No - copy requested     Chief Complaint  Patient presents with   Medical Management of Chronic Issues    Patient is being seen for a 4 month follow up and discuss labs and bone density   Immunizations    Patient is due for shingles and covid vaccine    HPI: Patient is a 89 y.o. female seen today for Follow up  Lives in [facility] [place] apartment    Acute issues Fall  Had Mechanical Fall in 04/24 Xray of Left Leg very minimal fracture through the anterior cortex just at the tibial tuberosity region was Full WBAT per Ortho Did Quad Exercises for strengthening  Neck Pain   Patient is doing  lots of exercises including Aerobic and Tai chi She wants to stop Gabapentin as she thinks it makes her Foggy She also feels it does not help and Makes her depressed She did start taking Tramadol and it is helping her more Has seen Neurosurgeon and Dr Ethelene Hal before and plan is For pain control   Her BP is controlled   loss of husband  Does seemed depressed Just finished her Group Grief Counseling  Right leg swelling Dopplers Negative Now uses Ted hoses     Patient also has a history of , arthritis, hypothyroidism, atrophic vaginitis, hyperlipidemia and hyponatremia Patient also has a history of gastric ulcer, gastritis and collagenous colitis. Had tapered herself off Protonix and budesonide S/p Lumbar Laminectomy in 12/2020 has recovered    Past Medical History:  Diagnosis Date   Anemia 11/2015   Arthritis    Blood transfusion 2017 JULY 22   Carotid stenosis    Dopplers 3/14: RICA 40-59%,  LICA 0-39%   Cataract    REMOVED   Chronic headaches    HX MIGRAINES   Collagenous colitis    Diverticulosis 2013   Colonoscopy   Gastric ulcer 11/2015   Glaucoma    BOTH EYES   Hiatal hernia    History of spinal stenosis    History of uterine fibroid    Hx of cardiac catheterization    LHC 3/14: no angiographic CAD, EF 65%   Hx of echocardiogram    Echocardiogram 06/21/12: Mild LVH, EF 60-65%, grade 2 diastolic dysfunction, mild MR   Hyperlipidemia    Hypertension    Hypothyroidism    Internal hemorrhoids 2009   Colonoscopy   Migraines    UTI (urinary tract infection) HX OF    Past Surgical History:  Procedure Laterality Date   BREAST BIOPSY Right    BUNIONECTOMY  YRS AGO LEFT   RIGHT CONE AT [facility] 3 YRS AGO   BUNIONECTOMY Left    CATARACT EXTRACTION W/ INTRAOCULAR LENS IMPLANT  03/2010   Left   CATARACT EXTRACTION W/ INTRAOCULAR LENS IMPLANT Bilateral 11/2010   COLONOSCOPY  2013   NORMAL    ESOPHAGOGASTRODUODENOSCOPY N/A 11/23/2015   Procedure: ESOPHAGOGASTRODUODENOSCOPY (EGD);  Surgeon: Malissa Hippo, MD;  [facility];  Service: Endoscopy;  Laterality: N/A;   EUS N/A 12/19/2015   Procedure:  ESOPHAGEAL ENDOSCOPIC ULTRASOUND (EUS) RADIAL;  Surgeon: Rachael Fee, MD;  [facility];  Service: Endoscopy;  Laterality: N/A;   FUNCTIONAL ENDOSCOPIC SINUS SURGERY     HAMMER TOE SURGERY     HAND SURGERY Right 1998   INGUINAL HERNIA REPAIR Left 03/11/2016   Procedure: LEFT INGUINAL HERINA REPAIR WITH MESH;  Surgeon: Manus Rudd, MD;  [facility]: [facility];  Service: General;  Laterality: Left;   INSERTION OF MESH Left 03/11/2016   Procedure: INSERTION OF MESH;  Surgeon: Manus Rudd, MD;  [facility]: [facility];  Service: General;  Laterality: Left;   IR RADIOLOGIST EVAL & MGMT  07/01/2017   LAMINECTOMY  03/2008   LAPAROTOMY N/A 11/11/2017   Procedure: MINI LAPAROTOMY;  Surgeon: Adolphus Birchwood, MD;  [facility]: [facility];  Service: Gynecology;  Laterality: N/A;    LUMBAR LAMINECTOMY/DECOMPRESSION MICRODISCECTOMY N/A 12/23/2020   Procedure: Laminectomy and Foraminotomy - Lumbar one-two;  Surgeon: Tia Alert, MD;  [facility];  Service: Neurosurgery;  Laterality: N/A;   POLYPECTOMY     REVERSE SHOULDER ARTHROPLASTY Right 10/13/2018   Procedure: REVERSE SHOULDER ARTHROPLASTY;  Surgeon: Francena Hanly, MD;  [facility];  Service: Orthopedics;  Laterality: Right;    ROBOTIC ASSISTED TOTAL HYSTERECTOMY WITH BILATERAL SALPINGO OOPHERECTOMY Bilateral 11/11/2017   Procedure: XI ROBOTIC ASSISTED TOTAL HYSTERECTOMY WITH BILATERAL SALPINGO OOPHORECTOMY FOR UTERUS GREATER THAN 250 GRAMS;  Surgeon: Adolphus Birchwood, MD;  [facility]: [facility];  Service: Gynecology;  Laterality: Bilateral;   TONSILLECTOMY     TOTAL HIP ARTHROPLASTY  2008   Right    No Known Allergies  Outpatient Encounter Medications as of 11/25/2022  Medication Sig   atorvastatin (LIPITOR) 10 MG tablet Take 1 tablet (10 mg total) by mouth daily.   Cholecalciferol (VITAMIN D3 SUPER STRENGTH) 50 MCG (2000 UT) TABS Take 2,000 Units by mouth daily.   clobetasol ointment (TEMOVATE) 0.05 % Apply 1 application topically daily as needed (vaginitis).   estradiol (ESTRACE) 0.5 MG tablet TAKE 1 TABLET BY MOUTH AT BEDTIME   gabapentin (NEURONTIN) 100 MG capsule Take 100 mg by mouth at bedtime as needed.   hydrochlorothiazide (HYDRODIURIL) 12.5 MG tablet TAKE 1 TABLET BY MOUTH EVERY DAY   latanoprost (XALATAN) 0.005 % ophthalmic solution Place 1 drop into both eyes at bedtime.   levothyroxine (SYNTHROID) 125 MCG tablet TAKE 1 TABLET BY MOUTH EVERY DAY   losartan (COZAAR) 100 MG tablet TAKE 1 TABLET BY MOUTH EVERY DAY   Multiple Vitamin (MULTIVITAMIN) tablet Take 1 tablet by mouth daily with lunch.   traMADol (ULTRAM) 50 MG tablet Take 25 mg by mouth 2 (two) times daily as needed.   Turmeric (QC TUMERIC COMPLEX PO) Take 1,500 Units by mouth 2 (two) times daily.   vitamin C (ASCORBIC ACID) 500 MG  tablet Take 500 mg by mouth daily with lunch.   [DISCONTINUED] gabapentin (NEURONTIN) 100 MG capsule TAKE 1 CAPSULE BY MOUTH TWO TIMES DAILY (Patient taking differently: Take 100 mg by mouth at bedtime as needed.)   No facility-administered encounter medications on file as of 11/25/2022.    Review of Systems:  Review of Systems  Constitutional:  Negative for activity change and appetite change.  HENT: Negative.    Respiratory:  Negative for cough and shortness of breath.   Cardiovascular:  Negative for leg swelling.  Gastrointestinal:  Negative for constipation.  Genitourinary: Negative.   Musculoskeletal:  Positive for arthralgias, back pain, gait problem and myalgias.  Skin: Negative.   Neurological:  Negative for  dizziness and weakness.  Psychiatric/Behavioral:  Positive for dysphoric mood. Negative for confusion and sleep disturbance.     Health Maintenance  Topic Date Due   Zoster Vaccines- Shingrix (1 of 2) 08/22/1950   COVID-19 Vaccine (4 - 2023-24 season) 01/02/2022   INFLUENZA VACCINE  12/03/2022   Medicare Annual Wellness (AWV)  08/03/2023   DTaP/Tdap/Td (2 - Td or Tdap) 06/13/2030   Pneumonia Vaccine 78+ Years old  Completed   DEXA SCAN  Completed   HPV VACCINES  Aged Out    Physical Exam: Vitals:   11/25/22 1018  BP: (!) 140/86  Pulse: 74  Resp: 16  Temp: 97.6 F (36.4 C)  TempSrc: Temporal  SpO2: 96%  Weight: 121 lb 1.6 oz (54.9 kg)  Height: 5\' 1"  (1.549 m)   Body mass index is 22.88 kg/m. Physical Exam Vitals reviewed.  Constitutional:      Appearance: Normal appearance.  HENT:     Head: Normocephalic.     Nose: Nose normal.     Mouth/Throat:     Mouth: Mucous membranes are moist.     Pharynx: Oropharynx is clear.  Eyes:     Pupils: Pupils are equal, round, and reactive to light.  Cardiovascular:     Rate and Rhythm: Normal rate and regular rhythm.     Pulses: Normal pulses.     Heart sounds: Normal heart sounds. No murmur heard. Pulmonary:      Effort: Pulmonary effort is normal.     Breath sounds: Normal breath sounds.  Abdominal:     General: Abdomen is flat. Bowel sounds are normal.     Palpations: Abdomen is soft.  Musculoskeletal:        General: No swelling.     Cervical back: Neck supple.  Skin:    General: Skin is warm.  Neurological:     General: No focal deficit present.     Mental Status: She is alert and oriented to person, place, and time.  Psychiatric:        Mood and Affect: Mood normal.        Thought Content: Thought content normal.     Labs reviewed: Basic Metabolic Panel: Recent Labs    02/19/22 0000 02/19/22 0715 07/06/22 0749  NA  --  138 138  K  --  4.2 4.2  CL  --  101 100  CO2  --  31 27  GLUCOSE  --  80 77  BUN  --  18 14  CREATININE  --  0.68 0.66  CALCIUM  --  9.1 8.9  TSH 0.82 0.82 1.85   Liver Function Tests: Recent Labs    02/19/22 0715 07/06/22 0749  AST 26 30  ALT 18 20  BILITOT 0.7 0.7  PROT 7.2 6.8   No results for input(s): "LIPASE", "AMYLASE" in the last 8760 hours. No results for input(s): "AMMONIA" in the last 8760 hours. CBC: Recent Labs    07/06/22 0749  WBC 5.1  NEUTROABS 2,795  HGB 12.7  HCT 36.9  MCV 87.4  PLT 111*   Lipid Panel: Recent Labs    02/19/22 0715 07/06/22 0749  CHOL 148 138  HDL 58 60  LDLCALC 76 66  TRIG 61 50  CHOLHDL 2.6 2.3   No results found for: "HGBA1C"  Procedures since last visit: DG BONE DENSITY (DXA)  Result Date: 11/13/2022 EXAM: DUAL X-RAY ABSORPTIOMETRY (DXA) FOR BONE MINERAL DENSITY IMPRESSION: Referring Physician:  Mahlon Gammon Your patient completed a bone  mineral density test using GE Lunar iDXA system (analysis version: 16). Technologist:   lmn PATIENT: Name: Carol Wells, Carol Wells Patient ID: [id number] Birth Date: [birth year] Height: 60.5 in. Sex: Female Measured: 11/13/2022 Weight: 122.0 lbs. Indications: Advanced Age, Caucasian, Estrogen Deficient, Family Hist. (Parent hip fracture), Gabapentin, Hypothyroid,  Levothyroxine, LSpine Surgery, osteoarthritis, Postmenopausal, Right hip replaced, Vitamin D Deficient Fractures: Right Tibia Treatments: Estradiol Oral Tablet, Multivitamin, Vitamin D (E933.5) ASSESSMENT: The BMD measured at Forearm Radius 33% is 0.576 g/cm2 with a T-score of -3.4. This patient is considered osteoporotic according to World Health Organization [facility]) criteria. The quality of the exam is good. Right hip excluded due to surgical hardware. L3 and L4 were excluded due to degenerative changes and laminectomy. Site Region Measured Date Measured Age YA BMD Significant CHANGE T-score Left Forearm Radius 33% 11/13/2022 89.2 -3.4 0.576 g/cm2 AP Spine L1-L2 11/13/2022 89.2 -0.6 1.092 g/cm2 Left Femur Neck 11/13/2022 89.2 -1.1 0.888 g/cm2 Left Femur Neck 06/05/2014 80.7 -0.8 0.921 g/cm2 World Health Organization [facility]) criteria for post-menopausal, Caucasian Women: Normal       T-score at or above -1 SD Osteopenia   T-score between -1 and -2.5 SD Osteoporosis T-score at or below -2.5 SD RECOMMENDATION: 1. All patients should optimize calcium and vitamin D intake. 2. Consider FDA-approved medical therapies in postmenopausal women and men aged 3 years and older, based on the following: a. A hip or vertebral (clinical or morphometric) fracture. b. T-score = -2.5 at the femoral neck or spine after appropriate evaluation to exclude secondary causes. c. Low bone mass (T-score between -1.0 and -2.5 at the femoral neck or spine) and a 10-year probability of a hip fracture = 3% or a 10-year probability of a major osteoporosis-related fracture = 20% based on the [place]-adapted WHO algorithm. d. Clinician judgment and/or patient preferences may indicate treatment for people with 10-year fracture probabilities above or below these levels. FOLLOW-UP: Patients with diagnosis of osteoporosis or at high risk for fracture should have regular bone mineral density tests. Patients eligible for Medicare are allowed routine testing every  2 years. The testing frequency can be increased to one year for patients who have rapidly progressing disease, are receiving or discontinuing medical therapy to restore bone mass, or have additional risk factors. I have reviewed this study and agree with the findings. [facility] Electronically Signed   By: Frederico Hamman M.D.   On: 11/13/2022 10:33    Assessment/Plan 1. Neck pain Make Neurontin PRN and can take Tramadol PRN Has seen Dr Yetta Barre and not considering surgery or Injections right now  2. Right leg swelling Negative for DVT Just Venous Insufficiency  3. Primary hypertension Hydrochlorothiazide,Losartan  4. Hypothyroidism, unspecified type TSH normal  5. Hyperlipidemia, unspecified hyperlipidemia type On statin  6. Reactive depression Discussed Antidepressant wants to wait till next visit  7 Osteoporosis Left Radius -3.4 Left Femur -1.1 L1-0.6 Will Continue Po estrogen  Labs/tests ordered:  * No order type specified * Next appt:  02/25/2023

## 2022-12-28 ENCOUNTER — Other Ambulatory Visit: Payer: Self-pay | Admitting: Nurse Practitioner

## 2022-12-28 DIAGNOSIS — E785 Hyperlipidemia, unspecified: Secondary | ICD-10-CM

## 2022-12-31 DIAGNOSIS — R519 Headache, unspecified: Secondary | ICD-10-CM | POA: Diagnosis not present

## 2022-12-31 DIAGNOSIS — J3489 Other specified disorders of nose and nasal sinuses: Secondary | ICD-10-CM | POA: Diagnosis not present

## 2022-12-31 DIAGNOSIS — G8929 Other chronic pain: Secondary | ICD-10-CM | POA: Diagnosis not present

## 2022-12-31 DIAGNOSIS — J309 Allergic rhinitis, unspecified: Secondary | ICD-10-CM | POA: Diagnosis not present

## 2023-01-05 DIAGNOSIS — J3489 Other specified disorders of nose and nasal sinuses: Secondary | ICD-10-CM | POA: Diagnosis not present

## 2023-01-11 NOTE — Progress Notes (Unsigned)
Cardiology Office Note   Date:  01/11/2023   ID:  Carol Wells, DOB 04/10/1932, MRN [id number]  PCP:  Mahlon Gammon, MD  Cardiologist:   Dietrich Pates, MD   Patient presents for follow up of HTN and dyspnea    History of Present Illness: Carol Wells is a 89 y.o. female with a history of 89 yo  HTN, dyspnea.  Echo in 2014 showed NSR  Gr II diastolic dysfunction   Stress echo abnormal, sugg of ischemia.  L heart cath in Feb 2014 showed no CAD  LVEF normal.  Also has atherosclerosis of aorta on CT scan    The pt says her BP is higher in the doctor's office        The pt says 2 wks ago she had L arm pain develop   Radiating to scapula   WOrse when she is laying on L side   CRay of neck showed some cervical arthritis   Patient notes occasional L chest pain when arm/scapula really hurting   Not at other times   [facility] denies SOB  unless she is going for a walk and trying to have a conversation.    No CP with activities around house    Pt lives in a townhouse   Able to do chores without problems   I saw the pt in Aug 2023   She wsa seen by Benjamine Sprague in March 2024   No outpatient medications have been marked as taking for the 01/13/23 encounter (Appointment) with Pricilla Riffle, MD.     Allergies:   Patient has no known allergies.   Past Medical History:  Diagnosis Date   Anemia 11/2015   Arthritis    Blood transfusion 2017 JULY 22   Carotid stenosis    Dopplers 3/14: RICA 40-59%, LICA 0-39%   Cataract    REMOVED   Chronic headaches    HX MIGRAINES   Collagenous colitis    Diverticulosis 2013   Colonoscopy   Gastric ulcer 11/2015   Glaucoma    BOTH EYES   Hiatal hernia    History of spinal stenosis    History of uterine fibroid    Hx of cardiac catheterization    LHC 3/14: no angiographic CAD, EF 65%   Hx of echocardiogram    Echocardiogram 06/21/12: Mild LVH, EF 60-65%, grade 2 diastolic dysfunction, mild MR   Hyperlipidemia    Hypertension    Hypothyroidism     Internal hemorrhoids 2009   Colonoscopy   Migraines    UTI (urinary tract infection) HX OF    Past Surgical History:  Procedure Laterality Date   BREAST BIOPSY Right    BUNIONECTOMY  YRS AGO LEFT   RIGHT CONE AT [facility] 3 YRS AGO   BUNIONECTOMY Left    CATARACT EXTRACTION W/ INTRAOCULAR LENS IMPLANT  03/2010   Left   CATARACT EXTRACTION W/ INTRAOCULAR LENS IMPLANT Bilateral 11/2010   COLONOSCOPY  2013   NORMAL    ESOPHAGOGASTRODUODENOSCOPY N/A 11/23/2015   Procedure: ESOPHAGOGASTRODUODENOSCOPY (EGD);  Surgeon: Malissa Hippo, MD;  [facility];  Service: Endoscopy;  Laterality: N/A;   EUS N/A 12/19/2015   Procedure: ESOPHAGEAL ENDOSCOPIC ULTRASOUND (EUS) RADIAL;  Surgeon: Rachael Fee, MD;  [facility];  Service: Endoscopy;  Laterality: N/A;   FUNCTIONAL ENDOSCOPIC SINUS SURGERY     HAMMER TOE SURGERY     HAND SURGERY Right 1998   INGUINAL HERNIA REPAIR Left 03/11/2016  Procedure: LEFT INGUINAL HERINA REPAIR WITH MESH;  Surgeon: Manus Rudd, MD;  [facility];  Service: General;  Laterality: Left;   INSERTION OF MESH Left 03/11/2016   Procedure: INSERTION OF MESH;  Surgeon: Manus Rudd, MD;  Location: [facility] OR;  Service: General;  Laterality: Left;   IR RADIOLOGIST EVAL & MGMT  07/01/2017   LAMINECTOMY  03/2008   LAPAROTOMY N/A 11/11/2017   Procedure: MINI LAPAROTOMY;  Surgeon: Adolphus Birchwood, MD;  [facility];  Service: Gynecology;  Laterality: N/A;   LUMBAR LAMINECTOMY/DECOMPRESSION MICRODISCECTOMY N/A 12/23/2020   Procedure: Laminectomy and Foraminotomy - Lumbar one-two;  Surgeon: Tia Alert, MD;  Location: [facility] OR;  Service: Neurosurgery;  Laterality: N/A;   POLYPECTOMY     REVERSE SHOULDER ARTHROPLASTY Right 10/13/2018   Procedure: REVERSE SHOULDER ARTHROPLASTY;  Surgeon: Francena Hanly, MD;  [facility];  Service: Orthopedics;  Laterality: Right;    ROBOTIC ASSISTED TOTAL HYSTERECTOMY WITH BILATERAL SALPINGO OOPHERECTOMY Bilateral  11/11/2017   Procedure: XI ROBOTIC ASSISTED TOTAL HYSTERECTOMY WITH BILATERAL SALPINGO OOPHORECTOMY FOR UTERUS GREATER THAN 250 GRAMS;  Surgeon: Adolphus Birchwood, MD;  [facility];  Service: Gynecology;  Laterality: Bilateral;   TONSILLECTOMY     TOTAL HIP ARTHROPLASTY  2008   Right     Social History:  The patient  reports that she has never smoked. She has never used smokeless tobacco. She reports current alcohol use. She reports that she does not use drugs.   Family History:  The patient's family history includes Anuerysm (age of onset: 4) in her father; Colon cancer (age of onset: 85) in her brother.    ROS:  Please see the history of present illness. All other systems are reviewed and  Negative to the above problem except as noted.    PHYSICAL EXAM: VS:  There were no vitals taken for this visit.  GEN: Well nourished, well developed, in no acute distress  HEENT: normal  Neck: no JVD, carotid bruits Cardiac: RRR; no murmur   No LE edema  Respiratory:  clear to auscultation bilaterally, normal work of breathing GI: soft, nontender, nondistended, + BS  No hepatomegaly  MS: no deformity Moving all extremities   Skin: warm and dry, no rash Neuro:  Strength and sensation are intact Psych: euthymic mood, full affect   EKG:  EKG is ordered today.  SR 71 bpm     Lipid Panel    Component Value Date/Time   CHOL 138 07/06/2022 0749   TRIG 50 07/06/2022 0749   HDL 60 07/06/2022 0749   CHOLHDL 2.3 07/06/2022 0749   VLDL 12.0 10/06/2012 0939   LDLCALC 66 07/06/2022 0749      Wt Readings from Last 3 Encounters:  11/25/22 121 lb 1.6 oz (54.9 kg)  08/03/22 127 lb (57.6 kg)  07/15/22 127 lb 1.6 oz (57.7 kg)      ASSESSMENT AND PLAN:  1  L arm/back/chest pain   I think this is related to neck  I do not think it represents angina   Follow     2  HTN  BP is high on 2 check   She says it is ually better    [facility]    She needs to get the cuff checked  there    May need to change Rx  She said she would check this week   3  Atherosclerosiss    With plaquing of the aorta LDL should be lower  I would recomm increasing to 10 mg per day   She will reflect on this     Current medicines are reviewed at length with the patient today.  The patient does not have concerns regarding medicines.  Signed, Dietrich Pates, MD  01/11/2023 9:01 PM    [facility] [address] [place], [place], [place]  [address] Phone: [phone]; Fax: [phone]

## 2023-01-13 ENCOUNTER — Encounter: Payer: Self-pay | Admitting: Internal Medicine

## 2023-01-13 ENCOUNTER — Ambulatory Visit: Payer: Medicare Other | Attending: Internal Medicine | Admitting: Internal Medicine

## 2023-01-13 VITALS — BP 120/80 | HR 74 | Ht 61.0 in | Wt 121.0 lb

## 2023-01-13 DIAGNOSIS — I10 Essential (primary) hypertension: Secondary | ICD-10-CM | POA: Insufficient documentation

## 2023-01-13 NOTE — Patient Instructions (Addendum)
Medication Instructions:  Your physician recommends that you continue on your current medications as directed. Please refer to the Current Medication list given to you today.  *If you need a refill on your cardiac medications before your next appointment, please call your pharmacy*  Follow-Up: At [facility], you and your health needs are our priority.  As part of our continuing mission to provide you with exceptional heart care, we have created designated Provider Care Teams.  These Care Teams include your primary Cardiologist (physician) and Advanced Practice Providers (APPs -  Physician Assistants and Nurse Practitioners) who all work together to provide you with the care you need, when you need it.  Your next appointment:   1 year  Provider:   Dietrich Pates, MD

## 2023-01-18 ENCOUNTER — Other Ambulatory Visit: Payer: Self-pay | Admitting: Physical Medicine and Rehabilitation

## 2023-01-18 DIAGNOSIS — J323 Chronic sphenoidal sinusitis: Secondary | ICD-10-CM

## 2023-01-27 ENCOUNTER — Other Ambulatory Visit: Payer: Self-pay | Admitting: Internal Medicine

## 2023-01-28 ENCOUNTER — Other Ambulatory Visit: Payer: Medicare Other

## 2023-02-01 ENCOUNTER — Ambulatory Visit
Admission: RE | Admit: 2023-02-01 | Discharge: 2023-02-01 | Disposition: A | Discharge status: Home / Self Care | Payer: Medicare Other | Source: Ambulatory Visit | Attending: Physical Medicine and Rehabilitation | Admitting: Physical Medicine and Rehabilitation

## 2023-02-01 DIAGNOSIS — I6782 Cerebral ischemia: Secondary | ICD-10-CM | POA: Diagnosis not present

## 2023-02-01 DIAGNOSIS — J323 Chronic sphenoidal sinusitis: Secondary | ICD-10-CM | POA: Diagnosis not present

## 2023-02-01 DIAGNOSIS — I6389 Other cerebral infarction: Secondary | ICD-10-CM | POA: Diagnosis not present

## 2023-02-01 DIAGNOSIS — G319 Degenerative disease of nervous system, unspecified: Secondary | ICD-10-CM | POA: Diagnosis not present

## 2023-02-01 MED ORDER — GADOPICLENOL 0.5 MMOL/ML IV SOLN: 5.5000 mL | Freq: Once | INTRAVENOUS | Status: DC | PRN

## 2023-02-01 MED ORDER — GADOPICLENOL 0.5 MMOL/ML IV SOLN
5.5000 mL | Freq: Once | INTRAVENOUS | Status: AC | PRN
  Administered 2023-02-01: 5.5 mL via INTRAVENOUS

## 2023-02-02 DIAGNOSIS — N8110 Cystocele, unspecified: Secondary | ICD-10-CM | POA: Diagnosis not present

## 2023-02-02 DIAGNOSIS — Z4689 Encounter for fitting and adjustment of other specified devices: Secondary | ICD-10-CM | POA: Diagnosis not present

## 2023-02-02 DIAGNOSIS — N816 Rectocele: Secondary | ICD-10-CM | POA: Diagnosis not present

## 2023-02-08 ENCOUNTER — Other Ambulatory Visit: Payer: Medicare Other

## 2023-02-11 DIAGNOSIS — J323 Chronic sphenoidal sinusitis: Secondary | ICD-10-CM | POA: Diagnosis not present

## 2023-02-24 ENCOUNTER — Other Ambulatory Visit: Payer: Self-pay | Admitting: Internal Medicine

## 2023-02-24 DIAGNOSIS — E785 Hyperlipidemia, unspecified: Secondary | ICD-10-CM

## 2023-02-24 DIAGNOSIS — I10 Essential (primary) hypertension: Secondary | ICD-10-CM

## 2023-02-24 DIAGNOSIS — E039 Hypothyroidism, unspecified: Secondary | ICD-10-CM

## 2023-02-25 ENCOUNTER — Other Ambulatory Visit: Payer: Medicare Other

## 2023-02-25 DIAGNOSIS — E039 Hypothyroidism, unspecified: Secondary | ICD-10-CM | POA: Diagnosis not present

## 2023-02-25 DIAGNOSIS — I10 Essential (primary) hypertension: Secondary | ICD-10-CM | POA: Diagnosis not present

## 2023-02-25 DIAGNOSIS — E785 Hyperlipidemia, unspecified: Secondary | ICD-10-CM | POA: Diagnosis not present

## 2023-02-26 LAB — COMPLETE METABOLIC PANEL WITH GFR
AG Ratio: 1.3 (calc) (ref 1.0–2.5)
ALT: 21 U/L (ref 6–29)
AST: 33 U/L (ref 10–35)
Albumin: 3.9 g/dL (ref 3.6–5.1)
Alkaline phosphatase (APISO): 51 U/L (ref 37–153)
BUN: 17 mg/dL (ref 7–25)
CO2: 30 mmol/L (ref 20–32)
Calcium: 9 mg/dL (ref 8.6–10.4)
Chloride: 97 mmol/L — ABNORMAL LOW (ref 98–110)
Creat: 0.69 mg/dL (ref 0.60–0.95)
Globulin: 3.1 g/dL (ref 1.9–3.7)
Glucose, Bld: 88 mg/dL (ref 65–99)
Potassium: 4.4 mmol/L (ref 3.5–5.3)
Sodium: 135 mmol/L (ref 135–146)
Total Bilirubin: 0.6 mg/dL (ref 0.2–1.2)
Total Protein: 7 g/dL (ref 6.1–8.1)
eGFR: 83 mL/min/{1.73_m2} (ref >=60)

## 2023-02-26 LAB — CBC WITH DIFFERENTIAL/PLATELET
Absolute Lymphocytes: 1810 {cells}/uL (ref 850–3900)
Absolute Monocytes: 378 {cells}/uL (ref 200–950)
Basophils Absolute: 9 {cells}/uL (ref 0–200)
Basophils Relative: 0.2 %
Eosinophils Absolute: 90 {cells}/uL (ref 15–500)
Eosinophils Relative: 2.1 %
HCT: 39.6 % (ref 35.0–45.0)
Hemoglobin: 12.7 g/dL (ref 11.7–15.5)
MCH: 29.4 pg (ref 27.0–33.0)
MCHC: 32.1 g/dL (ref 32.0–36.0)
MCV: 91.7 fL (ref 80.0–100.0)
MPV: 10.1 fL (ref 7.5–12.5)
Monocytes Relative: 8.8 %
Neutro Abs: 2012 {cells}/uL (ref 1500–7800)
Neutrophils Relative %: 46.8 %
Platelets: 110 10*3/uL — ABNORMAL LOW (ref 140–400)
RBC: 4.32 10*6/uL (ref 3.80–5.10)
RDW: 12 % (ref 11.0–15.0)
Total Lymphocyte: 42.1 %
WBC: 4.3 10*3/uL (ref 3.8–10.8)

## 2023-02-26 LAB — LIPID PANEL
Cholesterol: 148 mg/dL (ref <200)
HDL: 59 mg/dL (ref >=50)
LDL Cholesterol (Calc): 75 mg/dL
Non-HDL Cholesterol (Calc): 89 mg/dL (ref <130)
Total CHOL/HDL Ratio: 2.5 (calc) (ref <5.0)
Triglycerides: 48 mg/dL (ref <150)

## 2023-02-26 LAB — TSH: TSH: 2.82 m[IU]/L (ref 0.40–4.50)

## 2023-03-01 DIAGNOSIS — J323 Chronic sphenoidal sinusitis: Secondary | ICD-10-CM | POA: Diagnosis not present

## 2023-03-02 DIAGNOSIS — M47812 Spondylosis without myelopathy or radiculopathy, cervical region: Secondary | ICD-10-CM | POA: Diagnosis not present

## 2023-03-02 DIAGNOSIS — R519 Headache, unspecified: Secondary | ICD-10-CM | POA: Diagnosis not present

## 2023-03-03 ENCOUNTER — Non-Acute Institutional Stay: Payer: Medicare Other | Admitting: Internal Medicine

## 2023-03-03 ENCOUNTER — Encounter: Payer: Self-pay | Admitting: Internal Medicine

## 2023-03-03 VITALS — BP 140/80 | HR 74 | Temp 97.6°F | Resp 16 | Ht 61.0 in | Wt 123.1 lb

## 2023-03-03 DIAGNOSIS — I10 Essential (primary) hypertension: Secondary | ICD-10-CM

## 2023-03-03 DIAGNOSIS — M7989 Other specified soft tissue disorders: Secondary | ICD-10-CM | POA: Diagnosis not present

## 2023-03-03 DIAGNOSIS — Z23 Encounter for immunization: Secondary | ICD-10-CM | POA: Diagnosis not present

## 2023-03-03 DIAGNOSIS — M1990 Unspecified osteoarthritis, unspecified site: Secondary | ICD-10-CM

## 2023-03-03 DIAGNOSIS — E039 Hypothyroidism, unspecified: Secondary | ICD-10-CM

## 2023-03-03 DIAGNOSIS — E785 Hyperlipidemia, unspecified: Secondary | ICD-10-CM

## 2023-03-03 DIAGNOSIS — M542 Cervicalgia: Secondary | ICD-10-CM | POA: Diagnosis not present

## 2023-03-03 DIAGNOSIS — F329 Major depressive disorder, single episode, unspecified: Secondary | ICD-10-CM | POA: Diagnosis not present

## 2023-03-03 DIAGNOSIS — M810 Age-related osteoporosis without current pathological fracture: Secondary | ICD-10-CM | POA: Diagnosis not present

## 2023-03-03 MED ORDER — TRAMADOL HCL 50 MG PO TABS: 50.0000 mg | ORAL_TABLET | Freq: Two times a day (BID) | ORAL | 3 refills | Status: DC

## 2023-03-03 NOTE — Progress Notes (Signed)
Location:  [facility] Service:  Clinic (12)  Provider:   Code Status: DNR Goals of Care:     03/03/2023   10:44 AM  Advanced Directives  Does Patient Have a Medical Advance Directive? Yes  Type of Estate agent of [place];Living will  Does patient want to make changes to medical advance directive? No - Patient declined     Chief Complaint  Patient presents with   Medical Management of Chronic Issues    3 month follow up with labs     HPI: Patient is a 89 y.o. female seen today for medical management of chronic diseases.   Lives in [place] apartment    Acute issues Fall  Had Mechanical Fall in 04/24 Xray of Left Leg very minimal fracture through the anterior cortex just at the tibial tuberosity region was Full WBAT per Ortho Did Quad Exercises for strengthening   Doing well No Issues now   Neck Pain  Worsened with Severe headaches in Left side of her head Was seen by ENT. Had MRI done of her head which showed moderate left sphenoid sinusitis Patient was treated with antibiotics.  Her pain is better but she continues to have some pain.  Was seen by Ortho and they think that she would benefit from occipital block as she has cervical severe spinal canal stenosis Patient is planning to see Dr. Horald Chestnut. She is benefiting with tramadol   Her BP is controlled    loss of husband  Doing better with her mood   Right leg swelling Dopplers Negative Now uses Ted hoses      Patient also has a history of , arthritis, hypothyroidism, atrophic vaginitis, hyperlipidemia and hyponatremia Patient also has a history of gastric ulcer, gastritis and collagenous colitis. Had tapered herself off Protonix and budesonide S/p Lumbar Laminectomy in 12/2020 has recovered   Her Children live out of State  Past Medical History:  Diagnosis Date   Anemia 11/2015   Arthritis    Blood transfusion 2017 JULY 22   Carotid stenosis    Dopplers 3/14:  RICA 40-59%, LICA 0-39%   Cataract    REMOVED   Chronic headaches    HX MIGRAINES   Collagenous colitis    Diverticulosis 2013   Colonoscopy   Gastric ulcer 11/2015   Glaucoma    BOTH EYES   Hiatal hernia    History of spinal stenosis    History of uterine fibroid    Hx of cardiac catheterization    LHC 3/14: no angiographic CAD, EF 65%   Hx of echocardiogram    Echocardiogram 06/21/12: Mild LVH, EF 60-65%, grade 2 diastolic dysfunction, mild MR   Hyperlipidemia    Hypertension    Hypothyroidism    Internal hemorrhoids 2009   Colonoscopy   Migraines    UTI (urinary tract infection) HX OF    Past Surgical History:  Procedure Laterality Date   BREAST BIOPSY Right    BUNIONECTOMY  YRS AGO LEFT   RIGHT CONE AT [facility] 3 YRS AGO   BUNIONECTOMY Left    CATARACT EXTRACTION W/ INTRAOCULAR LENS IMPLANT  03/2010   Left   CATARACT EXTRACTION W/ INTRAOCULAR LENS IMPLANT Bilateral 11/2010   COLONOSCOPY  2013   NORMAL    ESOPHAGOGASTRODUODENOSCOPY N/A 11/23/2015   Procedure: ESOPHAGOGASTRODUODENOSCOPY (EGD);  Surgeon: Malissa Hippo, MD;  [facility];  Service: Endoscopy;  Laterality: N/A;   EUS N/A 12/19/2015  Procedure: ESOPHAGEAL ENDOSCOPIC ULTRASOUND (EUS) RADIAL;  Surgeon: Rachael Fee, MD;  [facility]: [facility];  Service: Endoscopy;  Laterality: N/A;   FUNCTIONAL ENDOSCOPIC SINUS SURGERY     HAMMER TOE SURGERY     HAND SURGERY Right 1998   INGUINAL HERNIA REPAIR Left 03/11/2016   Procedure: LEFT INGUINAL HERINA REPAIR WITH MESH;  Surgeon: Manus Rudd, MD;  [facility];  Service: General;  Laterality: Left;   INSERTION OF MESH Left 03/11/2016   Procedure: INSERTION OF MESH;  Surgeon: Manus Rudd, MD;  [facility];  Service: General;  Laterality: Left;   IR RADIOLOGIST EVAL & MGMT  07/01/2017   LAMINECTOMY  03/2008   LAPAROTOMY N/A 11/11/2017   Procedure: MINI LAPAROTOMY;  Surgeon: Adolphus Birchwood, MD;  [facility]: [facility];  Service: Gynecology;   Laterality: N/A;   LUMBAR LAMINECTOMY/DECOMPRESSION MICRODISCECTOMY N/A 12/23/2020   Procedure: Laminectomy and Foraminotomy - Lumbar one-two;  Surgeon: Tia Alert, MD;  Location: [facility] OR;  Service: Neurosurgery;  Laterality: N/A;   POLYPECTOMY     REVERSE SHOULDER ARTHROPLASTY Right 10/13/2018   Procedure: REVERSE SHOULDER ARTHROPLASTY;  Surgeon: Francena Hanly, MD;  [facility];  Service: Orthopedics;  Laterality: Right;    ROBOTIC ASSISTED TOTAL HYSTERECTOMY WITH BILATERAL SALPINGO OOPHERECTOMY Bilateral 11/11/2017   Procedure: XI ROBOTIC ASSISTED TOTAL HYSTERECTOMY WITH BILATERAL SALPINGO OOPHORECTOMY FOR UTERUS GREATER THAN 250 GRAMS;  Surgeon: Adolphus Birchwood, MD;  [facility];  Service: Gynecology;  Laterality: Bilateral;   TONSILLECTOMY     TOTAL HIP ARTHROPLASTY  2008   Right    No Known Allergies  Outpatient Encounter Medications as of 03/03/2023  Medication Sig   atorvastatin (LIPITOR) 10 MG tablet TAKE 1 TABLET BY MOUTH EVERY DAY   Cholecalciferol (VITAMIN D3 SUPER STRENGTH) 50 MCG (2000 UT) TABS Take 2,000 Units by mouth daily.   clobetasol ointment (TEMOVATE) 0.05 % Apply 1 application topically daily as needed (vaginitis).   estradiol (ESTRACE) 0.5 MG tablet TAKE ONE TABLET BY MOUTH AT BEDTIME   hydrochlorothiazide (HYDRODIURIL) 12.5 MG tablet TAKE 1 TABLET BY MOUTH EVERY DAY   latanoprost (XALATAN) 0.005 % ophthalmic solution Place 1 drop into both eyes at bedtime.   levothyroxine (SYNTHROID) 125 MCG tablet TAKE 1 TABLET BY MOUTH EVERY DAY   gabapentin (NEURONTIN) 100 MG capsule Take 100 mg by mouth at bedtime as needed. (Patient not taking: Reported on 01/13/2023)   losartan (COZAAR) 100 MG tablet TAKE 1 TABLET BY MOUTH EVERY DAY   Multiple Vitamin (MULTIVITAMIN) tablet Take 1 tablet by mouth daily with lunch.   traMADol (ULTRAM) 50 MG tablet Take 25 mg by mouth 2 (two) times daily as needed.   Turmeric (QC TUMERIC COMPLEX PO) Take 1,500 Units by mouth 2  (two) times daily.   vitamin C (ASCORBIC ACID) 500 MG tablet Take 500 mg by mouth daily with lunch.   No facility-administered encounter medications on file as of 03/03/2023.    Review of Systems:  Review of Systems  Constitutional:  Negative for activity change and appetite change.  HENT: Negative.    Respiratory:  Negative for cough and shortness of breath.   Cardiovascular:  Negative for leg swelling.  Gastrointestinal:  Negative for constipation.  Genitourinary: Negative.   Musculoskeletal:  Positive for neck pain. Negative for arthralgias, gait problem and myalgias.  Skin: Negative.   Neurological:  Positive for headaches. Negative for dizziness and weakness.  Psychiatric/Behavioral:  Negative for confusion, dysphoric mood and sleep disturbance.  Health Maintenance  Topic Date Due   Zoster Vaccines- Shingrix (1 of 2) 08/22/1950   INFLUENZA VACCINE  12/03/2022   COVID-19 Vaccine (5 - 2023-24 season) 04/14/2023   Medicare Annual Wellness (AWV)  08/03/2023   DTaP/Tdap/Td (2 - Td or Tdap) 06/13/2030   Pneumonia Vaccine 56+ Years old  Completed   DEXA SCAN  Completed   HPV VACCINES  Aged Out    Physical Exam: Vitals:   03/03/23 1042  BP: (!) 140/80  Pulse: 74  Resp: 16  Temp: 97.6 F (36.4 C)  TempSrc: Temporal  SpO2: 96%  Weight: 123 lb 1.6 oz (55.8 kg)  Height: 5\' 1"  (1.549 m)   Body mass index is 23.26 kg/m. Physical Exam Vitals reviewed.  Constitutional:      Appearance: Normal appearance.  HENT:     Head: Normocephalic.     Nose: Nose normal.     Mouth/Throat:     Mouth: Mucous membranes are moist.     Pharynx: Oropharynx is clear.  Eyes:     Pupils: Pupils are equal, round, and reactive to light.  Cardiovascular:     Rate and Rhythm: Normal rate and regular rhythm.     Pulses: Normal pulses.     Heart sounds: Normal heart sounds. No murmur heard. Pulmonary:     Effort: Pulmonary effort is normal.     Breath sounds: Normal breath sounds.   Abdominal:     General: Abdomen is flat. Bowel sounds are normal.     Palpations: Abdomen is soft.  Musculoskeletal:        General: Swelling present.     Cervical back: Neck supple.  Skin:    General: Skin is warm.  Neurological:     General: No focal deficit present.     Mental Status: She is alert and oriented to person, place, and time.  Psychiatric:        Mood and Affect: Mood normal.        Thought Content: Thought content normal.     Labs reviewed: Basic Metabolic Panel: Recent Labs    07/06/22 0749 02/25/23 0800  NA 138 135  K 4.2 4.4  CL 100 97*  CO2 27 30  GLUCOSE 77 88  BUN 14 17  CREATININE 0.66 0.69  CALCIUM 8.9 9.0  TSH 1.85 2.82   Liver Function Tests: Recent Labs    07/06/22 0749 02/25/23 0800  AST 30 33  ALT 20 21  BILITOT 0.7 0.6  PROT 6.8 7.0   No results for input(s): "LIPASE", "AMYLASE" in the last 8760 hours. No results for input(s): "AMMONIA" in the last 8760 hours. CBC: Recent Labs    07/06/22 0749 02/25/23 0800  WBC 5.1 4.3  NEUTROABS 2,795 2,012  HGB 12.7 12.7  HCT 36.9 39.6  MCV 87.4 91.7  PLT 111* 110*   Lipid Panel: Recent Labs    07/06/22 0749 02/25/23 0800  CHOL 138 148  HDL 60 59  LDLCALC 66 75  TRIG 50 48  CHOLHDL 2.3 2.5   No results found for: "HGBA1C"  Procedures since last visit: No results found.  Assessment/Plan 1. Primary hypertension Hydrochlorothiazide and Losartan  2. Hyperlipidemia, unspecified hyperlipidemia type statin 3. Neck pain She is considering Occipital Block per Dr Sandra Cockayne  On Tramadol per [facility] Did not tolerate Neurontin 4. Hypothyroidism, unspecified type TSH normal  5. Osteoarthritis, unspecified osteoarthritis type, unspecified site Uses tramadol PRn  6. Right leg swelling Negative for DVT Just Venous Insufficiency  7.  Reactive depression Mood better  8. Age-related osteoporosis without current pathological fracture Left Radius -3.4 Left Femur -1.1 L1-0.6 Will  Continue Po estrogen 9 Mild Thrombocytopenia She has no h/o Alcohol Abuse Will watch it for now   Labs/tests ordered:   Next appt:  Visit date not found

## 2023-03-14 ENCOUNTER — Other Ambulatory Visit: Payer: Self-pay | Admitting: Nurse Practitioner

## 2023-03-23 ENCOUNTER — Telehealth: Payer: Medicare Other

## 2023-03-23 NOTE — Telephone Encounter (Signed)
Called patient to advised her she need appointment tomorrow, 03/24/23 and she was added to the [facility] schedule at 9:00 AM.

## 2023-03-23 NOTE — Telephone Encounter (Signed)
Patient called requesting a medication for headaches to be she to her pharmacy. She reports the Tramadol not helping her headaches and she having one every day. Please advise

## 2023-03-24 ENCOUNTER — Ambulatory Visit: Payer: Medicare Other | Admitting: Internal Medicine

## 2023-03-24 ENCOUNTER — Encounter: Payer: Self-pay | Admitting: Internal Medicine

## 2023-03-24 VITALS — BP 157/85 | HR 80 | Temp 97.9°F | Resp 16 | Ht 61.0 in | Wt 122.0 lb

## 2023-03-24 DIAGNOSIS — M542 Cervicalgia: Secondary | ICD-10-CM | POA: Diagnosis not present

## 2023-03-24 DIAGNOSIS — I10 Essential (primary) hypertension: Secondary | ICD-10-CM | POA: Diagnosis not present

## 2023-03-24 DIAGNOSIS — G4452 New daily persistent headache (NDPH): Secondary | ICD-10-CM

## 2023-03-24 DIAGNOSIS — J323 Chronic sphenoidal sinusitis: Secondary | ICD-10-CM | POA: Diagnosis not present

## 2023-03-24 MED ORDER — TOPIRAMATE 25 MG PO TABS: 25.0000 mg | ORAL_TABLET | Freq: Two times a day (BID) | ORAL | 1 refills | Status: DC

## 2023-03-24 NOTE — Progress Notes (Signed)
Location:  Clinic   Place of Service:   [facility]  Provider:   Code Status: DNR Goals of Care:     03/24/2023    9:01 AM  Advanced Directives  Does Patient Have a Medical Advance Directive? Yes  Type of Estate agent of [place];Living will  Does patient want to make changes to medical advance directive? No - Patient declined     Chief Complaint  Patient presents with   Acute Visit    Patient states she has been having some headaches every day    Immunizations    Patient is due for shingles     HPI: Patient is a 89 y.o. female seen today for an acute visit for Worsening head aches  Lives in [place] in [place] Patient has a history of chronic headaches but recently she has noticed worsening.  She was seen by ENT and had an MRI done of her head which showed moderate left sphenoid sinusitis.  She was treated with 10 days of Augmentin followed by Levaquin.  Patient did not tolerate Levaquin due to Side effects  She was also seen by Dr. Horald Chestnut for occipital block for severe cervical spinal canal stenosis as seen in MRI.  But Dr. Horald Chestnut wanted to wait for the block due to her sinusitis. Patient continues to have headaches  Not localized to the left side it goes all around her frontal maxillary area and then on top of her head.  She has taken tramadol with has not helped.  She Tylenol has not helped.   .  She she does not want to take any narcotics.  Patient was very tearful She is  depressed due to headache and her husband's death Anniversary coming up   Past Medical History:  Diagnosis Date   Anemia 11/2015   Arthritis    Blood transfusion 2017 JULY 22   Carotid stenosis    Dopplers 3/14: RICA 40-59%, LICA 0-39%   Cataract    REMOVED   Chronic headaches    HX MIGRAINES   Collagenous colitis    Diverticulosis 2013   Colonoscopy   Gastric ulcer 11/2015   Glaucoma    BOTH EYES   Hiatal hernia    History of spinal stenosis    History of  uterine fibroid    Hx of cardiac catheterization    LHC 3/14: no angiographic CAD, EF 65%   Hx of echocardiogram    Echocardiogram 06/21/12: Mild LVH, EF 60-65%, grade 2 diastolic dysfunction, mild MR   Hyperlipidemia    Hypertension    Hypothyroidism    Internal hemorrhoids 2009   Colonoscopy   Migraines    UTI (urinary tract infection) HX OF    Past Surgical History:  Procedure Laterality Date   BREAST BIOPSY Right    BUNIONECTOMY  YRS AGO LEFT   RIGHT CONE AT [facility] 3 YRS AGO   BUNIONECTOMY Left    CATARACT EXTRACTION W/ INTRAOCULAR LENS IMPLANT  03/2010   Left   CATARACT EXTRACTION W/ INTRAOCULAR LENS IMPLANT Bilateral 11/2010   COLONOSCOPY  2013   NORMAL    ESOPHAGOGASTRODUODENOSCOPY N/A 11/23/2015   Procedure: ESOPHAGOGASTRODUODENOSCOPY (EGD);  Surgeon: Malissa Hippo, MD;  [facility];  Service: Endoscopy;  Laterality: N/A;   EUS N/A 12/19/2015   Procedure: ESOPHAGEAL ENDOSCOPIC ULTRASOUND (EUS) RADIAL;  Surgeon: Rachael Fee, MD;  [facility];  Service: Endoscopy;  Laterality: N/A;   FUNCTIONAL ENDOSCOPIC SINUS SURGERY  HAMMER TOE SURGERY     HAND SURGERY Right 1998   INGUINAL HERNIA REPAIR Left 03/11/2016   Procedure: LEFT INGUINAL HERINA REPAIR WITH MESH;  Surgeon: Manus Rudd, MD;  [facility];  Service: General;  Laterality: Left;   INSERTION OF MESH Left 03/11/2016   Procedure: INSERTION OF MESH;  Surgeon: Manus Rudd, MD;  [facility];  Service: General;  Laterality: Left;   IR RADIOLOGIST EVAL & MGMT  07/01/2017   LAMINECTOMY  03/2008   LAPAROTOMY N/A 11/11/2017   Procedure: MINI LAPAROTOMY;  Surgeon: Adolphus Birchwood, MD;  [facility];  Service: Gynecology;  Laterality: N/A;   LUMBAR LAMINECTOMY/DECOMPRESSION MICRODISCECTOMY N/A 12/23/2020   Procedure: Laminectomy and Foraminotomy - Lumbar one-two;  Surgeon: Tia Alert, MD;  Location: [facility] OR;  Service: Neurosurgery;  Laterality: N/A;   POLYPECTOMY     REVERSE  SHOULDER ARTHROPLASTY Right 10/13/2018   Procedure: REVERSE SHOULDER ARTHROPLASTY;  Surgeon: Francena Hanly, MD;  [facility];  Service: Orthopedics;  Laterality: Right;    ROBOTIC ASSISTED TOTAL HYSTERECTOMY WITH BILATERAL SALPINGO OOPHERECTOMY Bilateral 11/11/2017   Procedure: XI ROBOTIC ASSISTED TOTAL HYSTERECTOMY WITH BILATERAL SALPINGO OOPHORECTOMY FOR UTERUS GREATER THAN 250 GRAMS;  Surgeon: Adolphus Birchwood, MD;  [facility];  Service: Gynecology;  Laterality: Bilateral;   TONSILLECTOMY     TOTAL HIP ARTHROPLASTY  2008   Right    No Known Allergies  Outpatient Encounter Medications as of 03/24/2023  Medication Sig   atorvastatin (LIPITOR) 10 MG tablet TAKE 1 TABLET BY MOUTH EVERY DAY   Cholecalciferol (VITAMIN D3 SUPER STRENGTH) 50 MCG (2000 UT) TABS Take 2,000 Units by mouth daily.   clobetasol ointment (TEMOVATE) 0.05 % Apply 1 application topically daily as needed (vaginitis).   estradiol (ESTRACE) 0.5 MG tablet TAKE ONE TABLET BY MOUTH AT BEDTIME   hydrochlorothiazide (HYDRODIURIL) 12.5 MG tablet TAKE 1 TABLET BY MOUTH EVERY DAY   latanoprost (XALATAN) 0.005 % ophthalmic solution Place 1 drop into both eyes at bedtime.   levothyroxine (SYNTHROID) 125 MCG tablet TAKE 1 TABLET BY MOUTH EVERY DAY   losartan (COZAAR) 100 MG tablet TAKE 1 TABLET BY MOUTH EVERY DAY   Multiple Vitamin (MULTIVITAMIN) tablet Take 1 tablet by mouth daily with lunch.   topiramate (TOPAMAX) 25 MG tablet Take 1 tablet (25 mg total) by mouth 2 (two) times daily.   traMADol (ULTRAM) 50 MG tablet Take 1 tablet (50 mg total) by mouth 2 (two) times daily.   Turmeric (QC TUMERIC COMPLEX PO) Take 1,500 Units by mouth 2 (two) times daily.   vitamin C (ASCORBIC ACID) 500 MG tablet Take 500 mg by mouth daily with lunch.   No facility-administered encounter medications on file as of 03/24/2023.    Review of Systems:  Review of Systems  Constitutional:  Negative for activity change and appetite change.   HENT:  Positive for rhinorrhea.   Respiratory:  Negative for cough and shortness of breath.   Cardiovascular:  Negative for leg swelling.  Gastrointestinal:  Negative for constipation.  Genitourinary: Negative.   Musculoskeletal:  Positive for back pain. Negative for arthralgias, gait problem and myalgias.  Skin: Negative.   Neurological:  Positive for headaches. Negative for dizziness and weakness.  Psychiatric/Behavioral:  Positive for dysphoric mood. Negative for confusion and sleep disturbance.     Health Maintenance  Topic Date Due   Zoster Vaccines- Shingrix (1 of 2) 08/22/1950   COVID-19 Vaccine (5 - 2023-24 season) 04/14/2023   Medicare Annual Wellness (AWV)  08/03/2023   DTaP/Tdap/Td (2 - Td or Tdap) 06/13/2030   Pneumonia Vaccine 23+ Years old  Completed   INFLUENZA VACCINE  Completed   DEXA SCAN  Completed   HPV VACCINES  Aged Out    Physical Exam: Vitals:   03/24/23 0902  BP: (!) 157/85  Pulse: 80  Resp: 16  Temp: 97.9 F (36.6 C)  TempSrc: Temporal  SpO2: 97%  Weight: 122 lb (55.3 kg)  Height: 5\' 1"  (1.549 m)   Body mass index is 23.05 kg/m. Physical Exam Vitals reviewed.  Constitutional:      Appearance: Normal appearance.  HENT:     Head: Normocephalic.     Nose: Nose normal.     Mouth/Throat:     Mouth: Mucous membranes are moist.     Pharynx: Oropharynx is clear.  Eyes:     Pupils: Pupils are equal, round, and reactive to light.  Cardiovascular:     Rate and Rhythm: Normal rate and regular rhythm.     Pulses: Normal pulses.     Heart sounds: Normal heart sounds. No murmur heard. Pulmonary:     Effort: Pulmonary effort is normal.     Breath sounds: Normal breath sounds.  Abdominal:     General: Abdomen is flat. Bowel sounds are normal.     Palpations: Abdomen is soft.  Musculoskeletal:        General: No swelling.     Cervical back: Neck supple.  Skin:    General: Skin is warm.  Neurological:     General: No focal deficit present.      Mental Status: She is alert and oriented to person, place, and time.  Psychiatric:        Mood and Affect: Mood normal.        Thought Content: Thought content normal.     Labs reviewed: Basic Metabolic Panel: Recent Labs    07/06/22 0749 02/25/23 0800  NA 138 135  K 4.2 4.4  CL 100 97*  CO2 27 30  GLUCOSE 77 88  BUN 14 17  CREATININE 0.66 0.69  CALCIUM 8.9 9.0  TSH 1.85 2.82   Liver Function Tests: Recent Labs    07/06/22 0749 02/25/23 0800  AST 30 33  ALT 20 21  BILITOT 0.7 0.6  PROT 6.8 7.0   No results for input(s): "LIPASE", "AMYLASE" in the last 8760 hours. No results for input(s): "AMMONIA" in the last 8760 hours. CBC: Recent Labs    07/06/22 0749 02/25/23 0800  WBC 5.1 4.3  NEUTROABS 2,795 2,012  HGB 12.7 12.7  HCT 36.9 39.6  MCV 87.4 91.7  PLT 111* 110*   Lipid Panel: Recent Labs    07/06/22 0749 02/25/23 0800  CHOL 138 148  HDL 60 59  LDLCALC 66 75  TRIG 50 48  CHOLHDL 2.3 2.5   No results found for: "HGBA1C"  Procedures since last visit: No results found.  Assessment/Plan 1. New daily persistent headache Patient has severe persistent headaches.  MRI of the head has shown  Moderate left sphenoid sinusitis.  .  Patient was already treated with 10 days of Augmentin and 5 days of Levaquin which was stopped due to Muscle Weakness She has refused to surgery to ENT. I do not feel her headaches are only due to sinusitis.  I think it is both tension and migraine headaches Also Has C3-5 Spinal Stenosis Will  try Topamax 25 mg twice daily to see if it helps Headaches We also discussed  about Cymbalta Patient also has called neurology office.  She make an appointment    Total time spent in this patient care encounter was  45_  minutes; greater than 50% of the visit spent counseling patient and staff, reviewing records , Labs and coordinating care for problems addressed at this encounter.   Labs/tests ordered:  * No order type specified  * Next appt:  04/07/2023

## 2023-04-05 DIAGNOSIS — M5481 Occipital neuralgia: Secondary | ICD-10-CM | POA: Diagnosis not present

## 2023-04-05 DIAGNOSIS — H401431 Capsular glaucoma with pseudoexfoliation of lens, bilateral, mild stage: Secondary | ICD-10-CM | POA: Diagnosis not present

## 2023-04-05 DIAGNOSIS — R519 Headache, unspecified: Secondary | ICD-10-CM | POA: Diagnosis not present

## 2023-04-05 DIAGNOSIS — M47812 Spondylosis without myelopathy or radiculopathy, cervical region: Secondary | ICD-10-CM | POA: Diagnosis not present

## 2023-04-05 DIAGNOSIS — Z961 Presence of intraocular lens: Secondary | ICD-10-CM | POA: Diagnosis not present

## 2023-04-07 ENCOUNTER — Non-Acute Institutional Stay: Payer: Medicare Other | Admitting: Internal Medicine

## 2023-04-07 ENCOUNTER — Encounter: Payer: Self-pay | Admitting: Internal Medicine

## 2023-04-07 VITALS — BP 138/82 | HR 79 | Temp 98.7°F | Resp 17 | Ht 61.0 in | Wt 121.1 lb

## 2023-04-07 DIAGNOSIS — M542 Cervicalgia: Secondary | ICD-10-CM | POA: Diagnosis not present

## 2023-04-07 DIAGNOSIS — J323 Chronic sphenoidal sinusitis: Secondary | ICD-10-CM | POA: Diagnosis not present

## 2023-04-07 DIAGNOSIS — G4452 New daily persistent headache (NDPH): Secondary | ICD-10-CM

## 2023-04-07 NOTE — Patient Instructions (Signed)
Fioricet for helping with Pain Effexor for prevention of Headaches

## 2023-04-08 NOTE — Progress Notes (Signed)
[facility]: [facility] Service:  Clinic (12)  Provider:   Code Status: DNR Goals of Care:     04/07/2023   10:57 AM  Advanced Directives  Does Patient Have a Medical Advance Directive? Yes  Type of Estate agent of [place];Living will  Copy of Healthcare Power of Attorney in Chart? No - copy requested     Chief Complaint  Patient presents with   Medical Management of Chronic Issues    Patient is being seen for a 2 week follow up on her headaches. Patient states she is still having headaches     HPI: Patient is a 89 y.o. female seen today for an acute visit for Headaches  Lives in [facility] in [place] in [place] Patient has a history of chronic headaches but recently she has noticed worsening.  She was seen by ENT and had an MRI done of her head which showed moderate left sphenoid sinusitis.  She was treated with 10 days of Augmentin followed by Levaquin.  Patient did not tolerate Levaquin due to Side effects   She was also seen by Dr. Horald Chestnut for occipital block for severe cervical spinal canal stenosis as seen in MRI.  But Dr. Horald Chestnut wanted to wait for the block due to her sinusitis. But now ENT has said it was ok to get the occipital block.  Patient has scheduled that with Dr. Horald Chestnut. I had started her on Topamax to see if that will help her pain..  She said it made it worse.  She has failed other medications in the past.  Right now she is using tramadol and that is helping her.  Past Medical History:  Diagnosis Date   Anemia 11/2015   Arthritis    Blood transfusion 2017 JULY 22   Carotid stenosis    Dopplers 3/14: RICA 40-59%, LICA 0-39%   Cataract    REMOVED   Chronic headaches    HX MIGRAINES   Collagenous colitis    Diverticulosis 2013   Colonoscopy   Gastric ulcer 11/2015   Glaucoma    BOTH EYES   Hiatal hernia    History of spinal stenosis    History of uterine fibroid    Hx of cardiac catheterization    LHC 3/14: no  angiographic CAD, EF 65%   Hx of echocardiogram    Echocardiogram 06/21/12: Mild LVH, EF 60-65%, grade 2 diastolic dysfunction, mild MR   Hyperlipidemia    Hypertension    Hypothyroidism    Internal hemorrhoids 2009   Colonoscopy   Migraines    UTI (urinary tract infection) HX OF    Past Surgical History:  Procedure Laterality Date   BREAST BIOPSY Right    BUNIONECTOMY  YRS AGO LEFT   RIGHT CONE AT [facility] 3 YRS AGO   BUNIONECTOMY Left    CATARACT EXTRACTION W/ INTRAOCULAR LENS IMPLANT  03/2010   Left   CATARACT EXTRACTION W/ INTRAOCULAR LENS IMPLANT Bilateral 11/2010   COLONOSCOPY  2013   NORMAL    ESOPHAGOGASTRODUODENOSCOPY N/A 11/23/2015   Procedure: ESOPHAGOGASTRODUODENOSCOPY (EGD);  Surgeon: Malissa Hippo, MD;  [facility];  Service: Endoscopy;  Laterality: N/A;   EUS N/A 12/19/2015   Procedure: ESOPHAGEAL ENDOSCOPIC ULTRASOUND (EUS) RADIAL;  Surgeon: Rachael Fee, MD;  [facility]: [facility];  Service: Endoscopy;  Laterality: N/A;   FUNCTIONAL ENDOSCOPIC SINUS SURGERY     HAMMER TOE SURGERY     HAND SURGERY Right 1998  INGUINAL HERNIA REPAIR Left 03/11/2016   Procedure: LEFT INGUINAL HERINA REPAIR WITH MESH;  Surgeon: Manus Rudd, MD;  [facility]: [facility] OR;  Service: General;  Laterality: Left;   INSERTION OF MESH Left 03/11/2016   Procedure: INSERTION OF MESH;  Surgeon: Manus Rudd, MD;  Location: [facility] OR;  Service: General;  Laterality: Left;   IR RADIOLOGIST EVAL & MGMT  07/01/2017   LAMINECTOMY  03/2008   LAPAROTOMY N/A 11/11/2017   Procedure: MINI LAPAROTOMY;  Surgeon: Adolphus Birchwood, MD;  [facility]: [facility];  Service: Gynecology;  Laterality: N/A;   LUMBAR LAMINECTOMY/DECOMPRESSION MICRODISCECTOMY N/A 12/23/2020   Procedure: Laminectomy and Foraminotomy - Lumbar one-two;  Surgeon: Tia Alert, MD;  Location[facility];  Service: Neurosurgery;  Laterality: N/A;   POLYPECTOMY     REVERSE SHOULDER ARTHROPLASTY Right 10/13/2018   Procedure: REVERSE SHOULDER  ARTHROPLASTY;  Surgeon: Francena Hanly, MD;  [facility]: [facility];  Service: Orthopedics;  Laterality: Right;    ROBOTIC ASSISTED TOTAL HYSTERECTOMY WITH BILATERAL SALPINGO OOPHERECTOMY Bilateral 11/11/2017   Procedure: XI ROBOTIC ASSISTED TOTAL HYSTERECTOMY WITH BILATERAL SALPINGO OOPHORECTOMY FOR UTERUS GREATER THAN 250 GRAMS;  Surgeon: Adolphus Birchwood, MD;  [facility];  Service: Gynecology;  Laterality: Bilateral;   TONSILLECTOMY     TOTAL HIP ARTHROPLASTY  2008   Right    No Known Allergies  Outpatient Encounter Medications as of 04/07/2023  Medication Sig   atorvastatin (LIPITOR) 10 MG tablet TAKE 1 TABLET BY MOUTH EVERY DAY   Cholecalciferol (VITAMIN D3 SUPER STRENGTH) 50 MCG (2000 UT) TABS Take 2,000 Units by mouth daily.   clobetasol ointment (TEMOVATE) 0.05 % Apply 1 application topically daily as needed (vaginitis).   estradiol (ESTRACE) 0.5 MG tablet TAKE ONE TABLET BY MOUTH AT BEDTIME   hydrochlorothiazide (HYDRODIURIL) 12.5 MG tablet TAKE 1 TABLET BY MOUTH EVERY DAY   latanoprost (XALATAN) 0.005 % ophthalmic solution Place 1 drop into both eyes at bedtime.   levothyroxine (SYNTHROID) 125 MCG tablet TAKE 1 TABLET BY MOUTH EVERY DAY   losartan (COZAAR) 100 MG tablet TAKE 1 TABLET BY MOUTH EVERY DAY   Multiple Vitamin (MULTIVITAMIN) tablet Take 1 tablet by mouth daily with lunch.   traMADol (ULTRAM) 50 MG tablet Take 1 tablet (50 mg total) by mouth 2 (two) times daily.   Turmeric (QC TUMERIC COMPLEX PO) Take 1,500 Units by mouth 2 (two) times daily.   vitamin C (ASCORBIC ACID) 500 MG tablet Take 500 mg by mouth daily with lunch.   [DISCONTINUED] topiramate (TOPAMAX) 25 MG tablet Take 1 tablet (25 mg total) by mouth 2 (two) times daily. (Patient not taking: Reported on 04/07/2023)   No facility-administered encounter medications on file as of 04/07/2023.    Review of Systems:  Review of Systems  Constitutional:  Negative for activity change and appetite change.  HENT:  Negative.    Respiratory:  Negative for cough and shortness of breath.   Cardiovascular:  Negative for leg swelling.  Gastrointestinal:  Negative for constipation.  Genitourinary: Negative.   Musculoskeletal:  Negative for arthralgias, gait problem and myalgias.  Skin: Negative.   Neurological:  Negative for dizziness and weakness.  Psychiatric/Behavioral:  Positive for dysphoric mood. Negative for confusion and sleep disturbance.     Health Maintenance  Topic Date Due   Zoster Vaccines- Shingrix (1 of 2) 08/22/1950   COVID-19 Vaccine (5 - 2023-24 season) 04/14/2023   Medicare Annual Wellness (AWV)  08/03/2023   DTaP/Tdap/Td (2 - Td or Tdap) 06/13/2030   Pneumonia Vaccine  83+ Years old  Completed   INFLUENZA VACCINE  Completed   DEXA SCAN  Completed   HPV VACCINES  Aged Out    Physical Exam: Vitals:   04/07/23 1055  BP: 138/82  Pulse: 79  Resp: 17  Temp: 98.7 F (37.1 C)  TempSrc: Temporal  SpO2: (!) 17%  Weight: 121 lb 1.6 oz (54.9 kg)  Height: 5\' 1"  (1.549 m)   Body mass index is 22.88 kg/m. Physical Exam Vitals reviewed.  Constitutional:      Appearance: Normal appearance.  HENT:     Head: Normocephalic.     Nose: Nose normal.     Mouth/Throat:     Mouth: Mucous membranes are moist.     Pharynx: Oropharynx is clear.  Eyes:     Pupils: Pupils are equal, round, and reactive to light.  Cardiovascular:     Rate and Rhythm: Normal rate and regular rhythm.     Pulses: Normal pulses.     Heart sounds: Normal heart sounds. No murmur heard. Pulmonary:     Effort: Pulmonary effort is normal.     Breath sounds: Normal breath sounds.  Abdominal:     General: Abdomen is flat. Bowel sounds are normal.     Palpations: Abdomen is soft.  Musculoskeletal:        General: No swelling.     Cervical back: Neck supple.  Skin:    General: Skin is warm.  Neurological:     General: No focal deficit present.     Mental Status: She is alert and oriented to person, place,  and time.  Psychiatric:        Mood and Affect: Mood normal.        Thought Content: Thought content normal.     Labs reviewed: Basic Metabolic Panel: Recent Labs    07/06/22 0749 02/25/23 0800  NA 138 135  K 4.2 4.4  CL 100 97*  CO2 27 30  GLUCOSE 77 88  BUN 14 17  CREATININE 0.66 0.69  CALCIUM 8.9 9.0  TSH 1.85 2.82   Liver Function Tests: Recent Labs    07/06/22 0749 02/25/23 0800  AST 30 33  ALT 20 21  BILITOT 0.7 0.6  PROT 6.8 7.0   No results for input(s): "LIPASE", "AMYLASE" in the last 8760 hours. No results for input(s): "AMMONIA" in the last 8760 hours. CBC: Recent Labs    07/06/22 0749 02/25/23 0800  WBC 5.1 4.3  NEUTROABS 2,795 2,012  HGB 12.7 12.7  HCT 36.9 39.6  MCV 87.4 91.7  PLT 111* 110*   Lipid Panel: Recent Labs    07/06/22 0749 02/25/23 0800  CHOL 138 148  HDL 60 59  LDLCALC 66 75  TRIG 50 48  CHOLHDL 2.3 2.5   No results found for: "HGBA1C"  Procedures since last visit: No results found.  Assessment/Plan 1. New daily persistent headache I gave her information and we discussed about Fioricet to use as needed consider Effexor for prevention.  Patient wants to wait and keep use tramadol for now.  She does have appointment with neurology in March.  She is also going to get occipital block per Dr. Horald Chestnut to see if it helps her headaches  2. Neck pain Plan For Occipital Block  3. Chronic sphenoidal sinusitis Was treated By ENT  For now no other treatment    Labs/tests ordered:   Next appt:  07/14/2023

## 2023-04-15 DIAGNOSIS — M5481 Occipital neuralgia: Secondary | ICD-10-CM | POA: Diagnosis not present

## 2023-04-29 ENCOUNTER — Telehealth: Payer: Self-pay

## 2023-04-29 ENCOUNTER — Ambulatory Visit (INDEPENDENT_AMBULATORY_CARE_PROVIDER_SITE_OTHER): Payer: Medicare Other | Admitting: Orthopedic Surgery

## 2023-04-29 ENCOUNTER — Encounter: Payer: Self-pay | Admitting: Orthopedic Surgery

## 2023-04-29 ENCOUNTER — Telehealth: Payer: Self-pay | Admitting: Internal Medicine

## 2023-04-29 VITALS — BP 162/82 | HR 99 | Temp 96.6°F | Resp 21 | Ht 61.0 in | Wt 122.2 lb

## 2023-04-29 DIAGNOSIS — M25512 Pain in left shoulder: Secondary | ICD-10-CM

## 2023-04-29 DIAGNOSIS — I10 Essential (primary) hypertension: Secondary | ICD-10-CM

## 2023-04-29 DIAGNOSIS — G4452 New daily persistent headache (NDPH): Secondary | ICD-10-CM | POA: Diagnosis not present

## 2023-04-29 DIAGNOSIS — G8929 Other chronic pain: Secondary | ICD-10-CM | POA: Diagnosis not present

## 2023-04-29 DIAGNOSIS — R079 Chest pain, unspecified: Secondary | ICD-10-CM | POA: Diagnosis not present

## 2023-04-29 MED ORDER — PREDNISONE 20 MG PO TABS: ORAL_TABLET | ORAL | 0 refills | Status: AC

## 2023-04-29 MED ORDER — AMLODIPINE BESYLATE 5 MG PO TABS: 2.5000 mg | ORAL_TABLET | Freq: Every day | ORAL | 1 refills | Status: DC

## 2023-04-29 NOTE — Patient Instructions (Signed)
IF CHEST PAIN/LEFT ARM PAIN WORSENS> REPORT TO EMERGENCY DEPARTMENT  Will prescribe prednisone taper x 10 days for left arm/neck pain  Start amlodipine 2.5 mg (1/2 tablet) by mouth daily for elevated blood pressure. Please take blood pressure twice daily until you see Dr. Chales Abrahams. If in one week no improvement to blood pressure> you may increase amlodipine to 5 mg (1 tablet) daily

## 2023-04-29 NOTE — Telephone Encounter (Signed)
Patient called to say she had an occipital nerve block on the 12th and since then with elevated blood pressure, 184/106 today. Patient states she is also experiencing left side arm pain and chest pain for about a week. Patient states she called the cardiologist and they will not do anything for her.   See telephone encounter from the cardiologist. Patient was informed that they recommended the ED, however patient states " I would rather be seen by my pcp or someone at your office."  Patient denied dizziness or blurred vision and scheduled appointment for 1 pm, I re-emphasized that patient should call 911 for emergency care if symptoms progress or become unbearable.

## 2023-04-29 NOTE — Progress Notes (Signed)
Careteam: Patient Care Team: Mahlon Gammon, MD as PCP - General (Internal Medicine) Pricilla Riffle, MD as PCP - Cardiology (Cardiology)  Seen by: Hazle Nordmann, AGNP-C  PLACE OF SERVICE:  [facility]  Advanced Directive information    No Known Allergies  Chief Complaint  Patient presents with   Acute Visit    Elevated blood pressure/ left arm pain, and chest pain     HPI: Patient is a 89 y.o. female seen today for acute visit due to elevated blood pressure.   Elevated blood pressure since 12/02. She reports average blood pressure > 180/100. She has a h/o headaches. Headaches have increased to daily occurrence the past 6 months. Denies blurred vision or sob. See below.   She also reports left scapula pain radiating down left arm and left upper chest pain that began about 5 days ago. Pain rated 9/10, described as steady stabbing. Pain at rest and with movement. She is taking Tramadol 50 mg BID. No recent fall or injury.  H/o cervical spondylosis  and cervical-occipital neuralgia s/p occipital block by Dr. Ethelene Hal 12/02. [place] nurse at [facility] evaluated her earlier today, BP was 180/100. [place] nurse called EMS for concerns of MI. Patient refused EMS evaluation and signed waiver. EKG in office NSR. Rechecked bp was 162/82. We discussed treatment options, she continues to refuse ED evaluation.     Review of Systems:  Review of Systems  Constitutional:  Negative for chills, fever and malaise/fatigue.  HENT: Negative.    Eyes: Negative.   Respiratory:  Negative for cough, shortness of breath and wheezing.   Cardiovascular:  Positive for chest pain. Negative for palpitations, orthopnea and leg swelling.  Gastrointestinal:  Negative for nausea and vomiting.  Genitourinary:  Negative for dysuria.  Musculoskeletal:  Positive for joint pain and neck pain. Negative for falls.  Skin: Negative.   Neurological:  Positive for headaches. Negative for dizziness and weakness.  Psychiatric/Behavioral:   Negative for depression. The patient is not nervous/anxious and does not have insomnia.     Past Medical History:  Diagnosis Date   Anemia 11/2015   Arthritis    Blood transfusion 2017 JULY 22   Carotid stenosis    Dopplers 3/14: RICA 40-59%, LICA 0-39%   Cataract    REMOVED   Chronic headaches    HX MIGRAINES   Collagenous colitis    Diverticulosis 2013   Colonoscopy   Gastric ulcer 11/2015   Glaucoma    BOTH EYES   Hiatal hernia    History of spinal stenosis    History of uterine fibroid    Hx of cardiac catheterization    LHC 3/14: no angiographic CAD, EF 65%   Hx of echocardiogram    Echocardiogram 06/21/12: Mild LVH, EF 60-65%, grade 2 diastolic dysfunction, mild MR   Hyperlipidemia    Hypertension    Hypothyroidism    Internal hemorrhoids 2009   Colonoscopy   Migraines    UTI (urinary tract infection) HX OF   Past Surgical History:  Procedure Laterality Date   BREAST BIOPSY Right    BUNIONECTOMY  YRS AGO LEFT   RIGHT CONE AT [facility] 3 YRS AGO   BUNIONECTOMY Left    CATARACT EXTRACTION W/ INTRAOCULAR LENS IMPLANT  03/2010   Left   CATARACT EXTRACTION W/ INTRAOCULAR LENS IMPLANT Bilateral 11/2010   COLONOSCOPY  2013   NORMAL    ESOPHAGOGASTRODUODENOSCOPY N/A 11/23/2015   Procedure: ESOPHAGOGASTRODUODENOSCOPY (EGD);  Surgeon: Malissa Hippo, MD;  [facility];  Service: Endoscopy;  Laterality: N/A;   EUS N/A 12/19/2015   Procedure: ESOPHAGEAL ENDOSCOPIC ULTRASOUND (EUS) RADIAL;  Surgeon: Rachael Fee, MD;  [facility];  Service: Endoscopy;  Laterality: N/A;   FUNCTIONAL ENDOSCOPIC SINUS SURGERY     HAMMER TOE SURGERY     HAND SURGERY Right 1998   INGUINAL HERNIA REPAIR Left 03/11/2016   Procedure: LEFT INGUINAL HERINA REPAIR WITH MESH;  Surgeon: Manus Rudd, MD;  [facility]: [facility];  Service: General;  Laterality: Left;   INSERTION OF MESH Left 03/11/2016   Procedure: INSERTION OF MESH;  Surgeon: Manus Rudd, MD;  Location: [facility];   Service: General;  Laterality: Left;   IR RADIOLOGIST EVAL & MGMT  07/01/2017   LAMINECTOMY  03/2008   LAPAROTOMY N/A 11/11/2017   Procedure: MINI LAPAROTOMY;  Surgeon: Adolphus Birchwood, MD;  [facility]: [facility];  Service: Gynecology;  Laterality: N/A;   LUMBAR LAMINECTOMY/DECOMPRESSION MICRODISCECTOMY N/A 12/23/2020   Procedure: Laminectomy and Foraminotomy - Lumbar one-two;  Surgeon: Tia Alert, MD;  Location: [facility] OR;  Service: Neurosurgery;  Laterality: N/A;   POLYPECTOMY     REVERSE SHOULDER ARTHROPLASTY Right 10/13/2018   Procedure: REVERSE SHOULDER ARTHROPLASTY;  Surgeon: Francena Hanly, MD;  [facility];  Service: Orthopedics;  Laterality: Right;    ROBOTIC ASSISTED TOTAL HYSTERECTOMY WITH BILATERAL SALPINGO OOPHERECTOMY Bilateral 11/11/2017   Procedure: XI ROBOTIC ASSISTED TOTAL HYSTERECTOMY WITH BILATERAL SALPINGO OOPHORECTOMY FOR UTERUS GREATER THAN 250 GRAMS;  Surgeon: Adolphus Birchwood, MD;  [facility];  Service: Gynecology;  Laterality: Bilateral;   TONSILLECTOMY     TOTAL HIP ARTHROPLASTY  2008   Right   Social History:   reports that she has never smoked. She has never used smokeless tobacco. She reports current alcohol use. She reports that she does not use drugs.  Family History  Problem Relation Age of Onset   Colon cancer Brother 22   Anuerysm Father 79    Medications: Patient's Medications  New Prescriptions   No medications on file  Previous Medications   ATORVASTATIN (LIPITOR) 10 MG TABLET    TAKE 1 TABLET BY MOUTH EVERY DAY   CHOLECALCIFEROL (VITAMIN D3 SUPER STRENGTH) 50 MCG (2000 UT) TABS    Take 2,000 Units by mouth daily.   CLOBETASOL OINTMENT (TEMOVATE) 0.05 %    Apply 1 application topically daily as needed (vaginitis).   ESTRADIOL (ESTRACE) 0.5 MG TABLET    TAKE ONE TABLET BY MOUTH AT BEDTIME   HYDROCHLOROTHIAZIDE (HYDRODIURIL) 12.5 MG TABLET    TAKE 1 TABLET BY MOUTH EVERY DAY   LATANOPROST (XALATAN) 0.005 % OPHTHALMIC SOLUTION    Place 1 drop  into both eyes at bedtime.   LEVOTHYROXINE (SYNTHROID) 125 MCG TABLET    TAKE 1 TABLET BY MOUTH EVERY DAY   LOSARTAN (COZAAR) 100 MG TABLET    TAKE 1 TABLET BY MOUTH EVERY DAY   MULTIPLE VITAMIN (MULTIVITAMIN) TABLET    Take 1 tablet by mouth daily with lunch.   TRAMADOL (ULTRAM) 50 MG TABLET    Take 1 tablet (50 mg total) by mouth 2 (two) times daily.   TURMERIC (QC TUMERIC COMPLEX PO)    Take 1,500 Units by mouth 2 (two) times daily.   VITAMIN C (ASCORBIC ACID) 500 MG TABLET    Take 500 mg by mouth daily with lunch.  Modified Medications   No medications on file  Discontinued Medications   No medications on file    Physical Exam:  Vitals:   04/29/23 1250  BP: (!) 153/78  Pulse: 99  Resp: (!) 21  Temp: (!) 96.6 F (35.9 C)  SpO2: 98%  Weight: 122 lb 3.2 oz (55.4 kg)  Height: 5\' 1"  (1.549 m)   Body mass index is 23.09 kg/m. Wt Readings from Last 3 Encounters:  04/29/23 122 lb 3.2 oz (55.4 kg)  04/07/23 121 lb 1.6 oz (54.9 kg)  03/24/23 122 lb (55.3 kg)    Physical Exam Vitals reviewed.  Constitutional:      General: She is not in acute distress. HENT:     Head: Normocephalic.  Eyes:     General:        Right eye: No discharge.        Left eye: No discharge.     Extraocular Movements: Extraocular movements intact.     Pupils: Pupils are equal, round, and reactive to light.  Neck:     Comments: Forward neck protrusion Cardiovascular:     Rate and Rhythm: Normal rate and regular rhythm.     Pulses: Normal pulses.     Heart sounds: Normal heart sounds. No murmur heard. Pulmonary:     Effort: Pulmonary effort is normal. No respiratory distress.     Breath sounds: Normal breath sounds. No wheezing or rales.  Chest:     Chest wall: No mass, deformity, swelling, tenderness or crepitus.  Abdominal:     Palpations: Abdomen is soft.  Musculoskeletal:     Left shoulder: Tenderness present. No swelling, deformity or crepitus. Decreased range of motion. Normal strength.  Normal pulse.     Cervical back: Neck supple.     Right lower leg: No edema.     Left lower leg: No edema.  Skin:    General: Skin is warm.     Capillary Refill: Capillary refill takes less than 2 seconds.  Neurological:     General: No focal deficit present.     Mental Status: She is alert and oriented to person, place, and time.  Psychiatric:        Mood and Affect: Mood normal.     Labs reviewed: Basic Metabolic Panel: Recent Labs    07/06/22 0749 02/25/23 0800  NA 138 135  K 4.2 4.4  CL 100 97*  CO2 27 30  GLUCOSE 77 88  BUN 14 17  CREATININE 0.66 0.69  CALCIUM 8.9 9.0  TSH 1.85 2.82   Liver Function Tests: Recent Labs    07/06/22 0749 02/25/23 0800  AST 30 33  ALT 20 21  BILITOT 0.7 0.6  PROT 6.8 7.0   No results for input(s): "LIPASE", "AMYLASE" in the last 8760 hours. No results for input(s): "AMMONIA" in the last 8760 hours. CBC: Recent Labs    07/06/22 0749 02/25/23 0800  WBC 5.1 4.3  NEUTROABS 2,795 2,012  HGB 12.7 12.7  HCT 36.9 39.6  MCV 87.4 91.7  PLT 111* 110*   Lipid Panel: Recent Labs    07/06/22 0749 02/25/23 0800  CHOL 138 148  HDL 60 59  LDLCALC 66 75  TRIG 50 48  CHOLHDL 2.3 2.5   TSH: Recent Labs    07/06/22 0749 02/25/23 0800  TSH 1.85 2.82   A1C: No results found for: "HGBA1C"   Assessment/Plan 1. Chest pain, unspecified type (Primary) - left upper chest pain x 5 days - EKG 12-Lead> NSR today - refused ED evaluation by EMS and in office today - suspect radiation of pain from cervical issues and  arthritis - advised to report to ED if chest pain worsens  2. Primary hypertension - uncontrolled, goal < 150/90 - home readings > 160/90 per patient - [place] nurse evaluation> 180/100 today - having headaches> now daily  - cont losartan and hydrochlorothiazide - will start amlodipine 2.5 mg po daily> may increase to 5 mg after 1 week if SBP not at goal - continue home blood pressures BID - cont low sodium diet -  advised to contact PCP or cardiology if SBP > 180 consistently or worsening HA and blurred vision - amLODipine (NORVASC) 5 MG tablet; Take 0.5 tablets (2.5 mg total) by mouth daily.  Dispense: 90 tablet; Refill: 1  3. Chronic left shoulder pain - followed by Dr. Ethelene Hal - h/o cervical spondylosis and cervical occipital neuralgia - suspect pain associated with cervical issues - will start prednisone taper for pain - cont Tramadol - predniSONE (DELTASONE) 20 MG tablet; Take 2 tablets (40 mg total) by mouth daily with breakfast for 5 days, THEN 1 tablet (20 mg total) daily with breakfast for 5 days.  Dispense: 15 tablet; Refill: 0  4. New daily persistent headache - ongoing, began 6 months ago per patient - see above - refused to try fiorcet  - scheduled to see neurology next year - ? relation with elevated bp and cervical issues - cont Tramadol  Total time: 32 minutes. Greater than 50% of total time spent doing patient education regarding atypical chest pain, elevated blood pressure, left shoulder pain and ongoing headaches including symptom/medication management.    Next appt: Visit date not found  Jonavin Seder Scherry Ran  [facility] [phone]

## 2023-04-29 NOTE — Telephone Encounter (Signed)
Spoke with patient and she states she has been experiencing elevated BP and now has left arm pain. Did advise with her BP increasing and her arm to got to ED.  She live in an assisted living facility. BP managed by PCP. I also advised to reach out to PCP as well.

## 2023-04-29 NOTE — Telephone Encounter (Signed)
Pt c/o BP issue: STAT if pt c/o blurred vision, one-sided weakness or slurred speech  1. What are your last 5 BP readings?  12/25 - 176/93 (1 am) 12/25 - 185/101 (10 am) 12/26 - 167/99 (6 am) 12/26 - 178/101 (8:20 am) 12/26 - 184/94 (8:30 am) 2. Are you having any other symptoms (ex. Dizziness, headache, blurred vision, passed out)? No  3. What is your BP issue? Pt states that BP has been elevated since 12/21. Pt also states that she is starting to have left arm pain as well. Pt says that she had a nerve block on 12/12 as well, not sure if that has anything to do with BP being elevated. She would like a c/b regarding this matter . Please advise

## 2023-04-29 NOTE — Telephone Encounter (Signed)
I have sent [facility] [place] nurse to assess patient and check blood pressure.

## 2023-05-06 DIAGNOSIS — M5030 Other cervical disc degeneration, unspecified cervical region: Secondary | ICD-10-CM | POA: Diagnosis not present

## 2023-05-12 ENCOUNTER — Non-Acute Institutional Stay: Payer: Medicare Other | Admitting: Internal Medicine

## 2023-05-12 ENCOUNTER — Telehealth: Payer: Self-pay | Admitting: Neurology

## 2023-05-12 ENCOUNTER — Encounter: Payer: Self-pay | Admitting: Internal Medicine

## 2023-05-12 VITALS — BP 131/78 | HR 76 | Temp 98.9°F | Resp 18 | Ht 61.0 in | Wt 122.1 lb

## 2023-05-12 DIAGNOSIS — I10 Essential (primary) hypertension: Secondary | ICD-10-CM | POA: Diagnosis not present

## 2023-05-12 DIAGNOSIS — R079 Chest pain, unspecified: Secondary | ICD-10-CM | POA: Diagnosis not present

## 2023-05-12 DIAGNOSIS — M542 Cervicalgia: Secondary | ICD-10-CM | POA: Diagnosis not present

## 2023-05-12 DIAGNOSIS — G4452 New daily persistent headache (NDPH): Secondary | ICD-10-CM | POA: Diagnosis not present

## 2023-05-12 MED ORDER — ESCITALOPRAM OXALATE 5 MG PO TABS: 5.0000 mg | ORAL_TABLET | Freq: Every day | ORAL | 0 refills | Status: DC

## 2023-05-12 MED ORDER — VENLAFAXINE HCL ER 37.5 MG PO CP24: 37.5000 mg | ORAL_CAPSULE | Freq: Every day | ORAL | 0 refills | Status: DC

## 2023-05-12 NOTE — Telephone Encounter (Signed)
 [facility]) calling to check if there is a earlier appt patient be seen. Pt 's headaches have become more worse and frequency. Informed can transfer to New Patient Referral for provider with a earlier. Transferred to New Patient Referrals.

## 2023-05-12 NOTE — Progress Notes (Signed)
 Location: [facility] of Service:  Clinic (12)  Provider:   Code Status: DNr Goals of Care:     05/12/2023    8:02 AM  Advanced Directives  Does Patient Have a Medical Advance Directive? Yes  Type of Estate Agent of [place];Living will;Out of facility DNR (pink MOST or yellow form)  Does patient want to make changes to medical advance directive? No - Patient declined  Copy of Healthcare Power of Attorney in Chart? No - copy requested     Chief Complaint  Patient presents with   Medical Management of Chronic Issues    blood pressure follow up   Immunizations    Shingrix and Covid    HPI: Patient is a 89 y.o. female seen today for an acute visit for Follow up  Lives in [place] in [facility] Discussed the use of AI scribe software for clinical note transcription with the patient, who gave verbal consent to proceed.  History of Present Illness   The patient, with a history of hypertension and chronic headaches, presented with concerns about escalating blood pressure readings and persistent pain.  The patient reported a nerve block procedure performed on December 12th, which unfortunately did not yield any improvement. Subsequent blood pressure readings taken at home showed elevated levels, with readings such as 168/88 and 185/101, causing concern.  Around December 26st, the patient began experiencing pain in the right scapula, arm, and hand, along with tingling sensations. The scapular pain was described as particularly severe. A few days later, the pain extended to the chest area. The patient was unsure if these symptoms were related to the nerve block or a separate issue.  Despite the escalating blood pressure and new onset of pain, the patient did not experience any shortness of breath. An EKG was performed, which showed a normal sinus rhythm. The patient was prescribed prednisone , which was discontinued three days earlier than advised due to side  effects of insomnia and jitteriness. Also started on Norvasc    The patient reported that the pain has since lessened but not completely resolved, now described as similar to a backache. Tingling sensations have now extended to all four extremities, which the patient attributes to issues with the back and neck.  The patient also reported chronic headaches, which are somewhat alleviated by tramadol . The headaches are inconsistent, sometimes presenting upon waking, and can affect different areas of the head. The patient expressed a desire for a treatment to control these headaches to improve daily function. 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 The patient has also discontinued amlodipine  in the past five days due to normalized blood pressure readings.     Past History  Patient also has a history of , arthritis, hypothyroidism, atrophic vaginitis, hyperlipidemia and hyponatremia Patient also has a history of gastric ulcer, gastritis and collagenous colitis. Had tapered herself off Protonix  and budesonide  S/p Lumbar Laminectomy in 12/2020 has recovered   Her Children live [place] of [place]    Past Medical History:  Diagnosis Date   Anemia 11/2015   Arthritis    Blood transfusion 2017 JULY 22   Carotid stenosis    Dopplers 3/14: RICA 40-59%, LICA 0-39%   Cataract    REMOVED   Chronic headaches    HX MIGRAINES   Collagenous colitis    Diverticulosis 2013  Colonoscopy   Gastric ulcer 11/2015   Glaucoma    BOTH EYES   Hiatal hernia    History of spinal stenosis    History of uterine fibroid    Hx of cardiac catheterization    LHC 3/14: no angiographic CAD, EF 65%   Hx of echocardiogram    Echocardiogram 06/21/12: Mild LVH, EF 60-65%, grade 2 diastolic dysfunction, mild MR   Hyperlipidemia    Hypertension    Hypothyroidism     Internal hemorrhoids 2009   Colonoscopy   Migraines    UTI (urinary tract infection) HX OF    Past Surgical History:  Procedure Laterality Date   BREAST BIOPSY Right    BUNIONECTOMY  YRS AGO LEFT   RIGHT [facility] 3 YRS AGO   BUNIONECTOMY Left    CATARACT EXTRACTION W/ INTRAOCULAR LENS IMPLANT  03/2010   Left   CATARACT EXTRACTION W/ INTRAOCULAR LENS IMPLANT Bilateral 11/2010   COLONOSCOPY  2013   NORMAL    ESOPHAGOGASTRODUODENOSCOPY N/A 11/23/2015   Procedure: ESOPHAGOGASTRODUODENOSCOPY (EGD);  Surgeon: Claudis RAYMOND Rivet, MD;  [facility]: [facility];  Service: Endoscopy;  Laterality: N/A;   EUS N/A 12/19/2015   Procedure: ESOPHAGEAL ENDOSCOPIC ULTRASOUND (EUS) RADIAL;  Surgeon: Toribio SHAUNNA Cedar, MD;  Location: [facility];  Service: Endoscopy;  Laterality: N/A;   FUNCTIONAL ENDOSCOPIC SINUS SURGERY     HAMMER TOE SURGERY     HAND SURGERY Right 1998   INGUINAL HERNIA REPAIR Left 03/11/2016   Procedure: LEFT INGUINAL HERINA REPAIR WITH MESH;  Surgeon: Donnice Lima, MD;  Location: [facility] OR;  Service: General;  Laterality: Left;   INSERTION OF MESH Left 03/11/2016   Procedure: INSERTION OF MESH;  Surgeon: Donnice Lima, MD;  Location: [facility] OR;  Service: General;  Laterality: Left;   IR RADIOLOGIST EVAL & MGMT  07/01/2017   LAMINECTOMY  03/2008   LAPAROTOMY N/A 11/11/2017   Procedure: MINI LAPAROTOMY;  Surgeon: Eloy Herring, MD;  Location: [facility];  Service: Gynecology;  Laterality: N/A;   LUMBAR LAMINECTOMY/DECOMPRESSION MICRODISCECTOMY N/A 12/23/2020   Procedure: Laminectomy and Foraminotomy - Lumbar one-two;  Surgeon: Joshua Alm RAMAN, MD;  [facility];  Service: Neurosurgery;  Laterality: N/A;   POLYPECTOMY     REVERSE SHOULDER ARTHROPLASTY Right 10/13/2018   Procedure: REVERSE SHOULDER ARTHROPLASTY;  Surgeon: Melita Drivers, MD;  Location: [facility];  Service: Orthopedics;  Laterality: Right;    ROBOTIC ASSISTED TOTAL HYSTERECTOMY WITH BILATERAL SALPINGO OOPHERECTOMY  Bilateral 11/11/2017   Procedure: XI ROBOTIC ASSISTED TOTAL HYSTERECTOMY WITH BILATERAL SALPINGO OOPHORECTOMY FOR UTERUS GREATER THAN 250 GRAMS;  Surgeon: Eloy Herring, MD;  Location: [facility];  Service: Gynecology;  Laterality: Bilateral;   TONSILLECTOMY     TOTAL HIP ARTHROPLASTY  2008   Right    No Known Allergies  Outpatient Encounter Medications as of 05/12/2023  Medication Sig   atorvastatin  (LIPITOR) 10 MG tablet TAKE 1 TABLET BY MOUTH EVERY DAY   Cholecalciferol (VITAMIN D3 SUPER STRENGTH) 50 MCG (2000 UT) TABS Take 2,000 Units by mouth daily.   clobetasol ointment (TEMOVATE) 0.05 % Apply 1 application topically daily as needed (vaginitis).   escitalopram  (LEXAPRO ) 5 MG tablet Take 1 tablet (5 mg total) by mouth daily.   estradiol  (ESTRACE ) 0.5 MG tablet TAKE ONE TABLET BY MOUTH AT BEDTIME   hydrochlorothiazide  (HYDRODIURIL ) 12.5 MG tablet TAKE 1 TABLET BY MOUTH EVERY DAY   latanoprost  (XALATAN ) 0.005 % ophthalmic solution Place 1 drop into both eyes at bedtime.   levothyroxine  (  SYNTHROID ) 125 MCG tablet TAKE 1 TABLET BY MOUTH EVERY DAY   losartan  (COZAAR ) 100 MG tablet TAKE 1 TABLET BY MOUTH EVERY DAY   Multiple Vitamin (MULTIVITAMIN) tablet Take 1 tablet by mouth daily with lunch.   traMADol  (ULTRAM ) 50 MG tablet Take 1 tablet (50 mg total) by mouth 2 (two) times daily.   Turmeric (QC TUMERIC COMPLEX PO) Take 1,500 Units by mouth 2 (two) times daily.   vitamin C (ASCORBIC ACID ) 500 MG tablet Take 500 mg by mouth daily with lunch.   [DISCONTINUED] amLODipine  (NORVASC ) 5 MG tablet Take 0.5 tablets (2.5 mg total) by mouth daily.   [DISCONTINUED] venlafaxine  XR (EFFEXOR  XR) 37.5 MG 24 hr capsule Take 1 capsule (37.5 mg total) by mouth daily with breakfast.   No facility-administered encounter medications on file as of 05/12/2023.    Review of Systems:  Review of Systems  Constitutional:  Negative for activity change and appetite change.  HENT: Negative.    Respiratory:  Negative for  cough and shortness of breath.   Cardiovascular:  Negative for leg swelling.  Gastrointestinal:  Negative for constipation.  Genitourinary: Negative.   Musculoskeletal:  Negative for arthralgias, gait problem and myalgias.  Skin: Negative.   Neurological:  Positive for headaches. Negative for dizziness and weakness.  Psychiatric/Behavioral:  Positive for dysphoric mood. Negative for confusion and sleep disturbance. The patient is nervous/anxious.     Health Maintenance  Topic Date Due   Zoster Vaccines- Shingrix (1 of 2) 08/22/1950   COVID-19 Vaccine (5 - 2024-25 season) 04/14/2023   Medicare Annual Wellness (AWV)  08/03/2023   DTaP/Tdap/Td (2 - Td or Tdap) 06/13/2030   Pneumonia Vaccine 82+ Years old  Completed   INFLUENZA VACCINE  Completed   DEXA SCAN  Completed   HPV VACCINES  Aged Out    Physical Exam: Vitals:   05/12/23 0804  BP: 131/78  Pulse: 76  Resp: 18  Temp: 98.9 F (37.2 C)  SpO2: 98%  Weight: 122 lb 1.6 oz (55.4 kg)  Height: 5' 1 (1.549 m)   Body mass index is 23.07 kg/m. Physical Exam Vitals reviewed.  Constitutional:      Appearance: Normal appearance.  HENT:     Head: Normocephalic.     Nose: Nose normal.     Mouth/Throat:     Mouth: Mucous membranes are moist.     Pharynx: Oropharynx is clear.  Eyes:     Pupils: Pupils are equal, round, and reactive to light.  Cardiovascular:     Rate and Rhythm: Normal rate and regular rhythm.     Pulses: Normal pulses.     Heart sounds: Normal heart sounds. No murmur heard. Pulmonary:     Effort: Pulmonary effort is normal.     Breath sounds: Normal breath sounds.  Abdominal:     General: Abdomen is flat. Bowel sounds are normal.     Palpations: Abdomen is soft.  Musculoskeletal:        General: No swelling.     Cervical back: Neck supple.  Skin:    General: Skin is warm.  Neurological:     General: No focal deficit present.     Mental Status: She is alert and oriented to person, place, and time.   Psychiatric:        Mood and Affect: Mood normal.        Thought Content: Thought content normal.     Labs reviewed: Basic Metabolic Panel: Recent Labs    07/06/22 0749  02/25/23 0800  NA 138 135  K 4.2 4.4  CL 100 97*  CO2 27 30  GLUCOSE 77 88  BUN 14 17  CREATININE 0.66 0.69  CALCIUM  8.9 9.0  TSH 1.85 2.82   Liver Function Tests: Recent Labs    07/06/22 0749 02/25/23 0800  AST 30 33  ALT 20 21  BILITOT 0.7 0.6  PROT 6.8 7.0   No results for input(s): LIPASE, AMYLASE in the last 8760 hours. No results for input(s): AMMONIA in the last 8760 hours. CBC: Recent Labs    07/06/22 0749 02/25/23 0800  WBC 5.1 4.3  NEUTROABS 2,795 2,012  HGB 12.7 12.7  HCT 36.9 39.6  MCV 87.4 91.7  PLT 111* 110*   Lipid Panel: Recent Labs    07/06/22 0749 02/25/23 0800  CHOL 138 148  HDL 60 59  LDLCALC 66 75  TRIG 50 48  CHOLHDL 2.3 2.5   No results found for: HGBA1C  Procedures since last visit: No results found.  Assessment/Plan Assessment and Plan    Hypertension Elevated blood pressure readings at home, up to 185/101. Recently started on Amlodipine  2.5mg , but patient has stopped taking it due to normalization of blood pressure.recent 5 days her BP has been in good Limits  -Advise patient to monitor blood pressure at home and restart Amlodipine  2.5mg  if readings consistently above 150 systolic or if associated with headache.  Chronic Headaches Daily headaches, sometimes relieved by Tramadol . Unclear etiology, possibly related to severe cervical stenosis. -Continue Tramadol  as needed for headache pain. -Start Lexapro  5 mg every day to see if it helps her Anxiety and Pain -Referral to Neurologist (Dr. Diona) for further evaluation, attempt to expedite appointment.  Cervical Stenosis Severe stenosis with spinal cord compression, recent nerve block provided no relief. -No further intervention planned at this time, focus on pain management.  Tingling in  Extremities New onset tingling in all four extremities, possibly related to cervical stenosis. -Continue to monitor, discuss further with Neurologist at upcoming appointment.          Labs/tests ordered:   Next appt:  05/26/2023

## 2023-05-12 NOTE — Telephone Encounter (Signed)
 Looks like she's coming tomorrow at 2:15 pm.

## 2023-05-13 ENCOUNTER — Ambulatory Visit (INDEPENDENT_AMBULATORY_CARE_PROVIDER_SITE_OTHER): Payer: Medicare Other | Admitting: Neurology

## 2023-05-13 ENCOUNTER — Encounter: Payer: Self-pay | Admitting: Neurology

## 2023-05-13 VITALS — BP 146/80 | HR 84 | Ht 61.0 in | Wt 122.0 lb

## 2023-05-13 DIAGNOSIS — R0683 Snoring: Secondary | ICD-10-CM | POA: Diagnosis not present

## 2023-05-13 DIAGNOSIS — Z820 Family history of epilepsy and other diseases of the nervous system: Secondary | ICD-10-CM

## 2023-05-13 DIAGNOSIS — G43009 Migraine without aura, not intractable, without status migrainosus: Secondary | ICD-10-CM

## 2023-05-13 DIAGNOSIS — R519 Headache, unspecified: Secondary | ICD-10-CM

## 2023-05-13 DIAGNOSIS — G4486 Cervicogenic headache: Secondary | ICD-10-CM | POA: Diagnosis not present

## 2023-05-13 DIAGNOSIS — Z9189 Other specified personal risk factors, not elsewhere classified: Secondary | ICD-10-CM

## 2023-05-13 NOTE — Patient Instructions (Signed)
 I will order a home sleep test to evaluate you for sleep apnea.  If you have obstructive sleep apnea I will likely recommend treatment with an AutoPap machine.  Go ahead and fill the prescription for Lexapro , I think it may be a good adjunct for headache management and reduce anxiety for you.  It is usually well-tolerated.  Stay well-hydrated and well rested.  Limit caffeine as you are, avoid nonsteroidal anti-inflammatory medications.

## 2023-05-13 NOTE — Progress Notes (Signed)
 Subjective:    Patient ID: Carol Wells is a 89 y.o. female.  HPI    True Mar, MD, PhD [facility] [address], [address] [address] [place], [place] [address]  Dear Olam,  I saw your patient, Carol Wells, upon your kind request in my neurologic clinic today for evaluation of her headaches.  The patient is unaccompanied today.  As you know, Carol Wells is an 89 year old female with an underlying medical history of hyperlipidemia, hypertension, glaucoma, hypothyroidism, anemia, arthritis, carotid stenosis, cataracts, diverticulosis, spinal stenosis, cervical spondylosis, allergic rhinitis, history of epistaxis with status post cautery patient under Dr. Karis in 2016, chronic sphenoidal sinusitis, migraine headaches, and vertigo who reports a longstanding history of recurrent migraines.  She has had recurrent headaches separate from migraines also for several years, migraines improved over time especially after her hysterectomy but for the past year she has had recurrence of her headache.  They have become nearly daily at this time, sometimes they are constant, typically not associated with nausea or vomiting or photophobia.  She has a history of chronic neck pain and recently saw Dr. Burnetta at [facility].  She was then referred to Dr. Bonner for interventional pain management and on 04/15/2023 received bilateral nerve blocks which did not really help.  She had a follow-up about a week ago and there was not a whole lot that was offered at the time.  She had been on gabapentin  in the past but had side effects.  For her migraines she has tried multiple medications in the past and over the course of time including Maxalt, Amerge, and sumatriptan hand.  She does not feel that her current headaches are migraine-like like they used to.  She has headaches upon awakening.  She has some history of snoring but has never had a sleep study.  She is widowed, her husband passed away about a  year ago.  She lives at friend's home in a cottage.  She has 2 grown children, daughter in [place]  and son lives in [place] .  She has grandchildren that are also out of state including in [place]  and [place] .  She is a retired [employer].  She has taken tramadol  for pain, originally started taking tramadol  after her hysterectomy.  As she lives alone, she is not keen on trying any medications that could be sedating or increase her fall risk.  She was offered a referral to pain management from Dr. Bonner but declined. I reviewed your note from 12/31/2022.    She had a brain MRI with and without contrast on 02/01/2023 with indication of chronic sphenoidal sinusitis.  I reviewed the results:  IMPRESSION: 1.  No evidence of an acute intracranial abnormality. 2. Chronic small vessel ischemic changes which are advanced in the cerebral white matter, and mild in the pons. Findings are similar to the prior brain MRI of 03/30/2017. 3. Chronic lacunar infarcts again noted within the bilateral thalami. 4. Mild-to-moderate generalized cerebral atrophy. 5. Moderate left sphenoid sinusitis. 6. Incompletely assessed cervical spondylosis. At C3-C4 and C4-C5, posterior disc osteophyte complexes contribute to spinal canal stenosis which appears at least moderate, and may be severe (with spinal cord flattening). Consider a dedicated cervical spine MRI for further evaluation.  In addition, I personally and independently reviewed images through the PACS system.    She had a CT of the facial bones without contrast through [facility] on 03/01/2023 and I reviewed the results: Impression: 1. Unchanged partial opacification of the left sphenoid  sinus.  2. Similar multifocal areas of bony dehiscence or thinning along the  posterior wall the left sphenoid sinus.   I reviewed her primary care note from 05/12/2023, she is followed by Dr. Charlanne.  She was given a prescription for Lexapro  low-dose, 5 mg strength but has not  picked it up yet, was reluctant to start anything new.  Often she wakes up first thing in the morning with a headache.  Her son has sleep apnea and uses a CPAP machine.  In the past, she had taken Topamax  briefly for about a week.  Of note, she does have a history of glaucoma.   She has a history of gastric ulcer and gastritis as well as colitis, she has been on budesonide .  She had lumbar spine surgery in the past.  In addition, she had multiple other surgeries including right shoulder reverse arthroplasty in 2020, hysterectomy and bilateral salpingo-oophorectomy in 2019, tonsillectomy, right total hip arthroplasty, cataract surgeries, sinus surgery, inguinal hernia repair. She denies any recent falls.  She tries to hydrate well with water .  She limits her caffeine to 1 cup of coffee in the morning, she is a non-smoker and does not drink any alcohol.  She is up-to-date with her eye examinations and usually goes twice a year.  Her Past Medical History Is Significant For: Past Medical History:  Diagnosis Date   Anemia 11/2015   Arthritis    Blood transfusion 2017 JULY 22   Carotid stenosis    Dopplers 3/14: RICA 40-59%, LICA 0-39%   Cataract    REMOVED   Chronic headaches    HX MIGRAINES   Collagenous colitis    Diverticulosis 2013   Colonoscopy   Gastric ulcer 11/2015   Glaucoma    BOTH EYES   Hiatal hernia    History of spinal stenosis    History of uterine fibroid    Hx of cardiac catheterization    LHC 3/14: no angiographic CAD, EF 65%   Hx of echocardiogram    Echocardiogram 06/21/12: Mild LVH, EF 60-65%, grade 2 diastolic dysfunction, mild MR   Hyperlipidemia    Hypertension    Hypothyroidism    Internal hemorrhoids 2009   Colonoscopy   Migraines    UTI (urinary tract infection) HX OF    Her Past Surgical History Is Significant For: Past Surgical History:  Procedure Laterality Date   BREAST BIOPSY Right    BUNIONECTOMY  YRS AGO LEFT   RIGHT [facility] 3 YRS AGO    BUNIONECTOMY Left    CATARACT EXTRACTION W/ INTRAOCULAR LENS IMPLANT  03/2010   Left   CATARACT EXTRACTION W/ INTRAOCULAR LENS IMPLANT Bilateral 11/2010   COLONOSCOPY  2013   NORMAL    ESOPHAGOGASTRODUODENOSCOPY N/A 11/23/2015   Procedure: ESOPHAGOGASTRODUODENOSCOPY (EGD);  Surgeon: Claudis RAYMOND Rivet, MD;  [facility];  Service: Endoscopy;  Laterality: N/A;   EUS N/A 12/19/2015   Procedure: ESOPHAGEAL ENDOSCOPIC ULTRASOUND (EUS) RADIAL;  Surgeon: Toribio SHAUNNA Cedar, MD;  Location: [facility];  Service: Endoscopy;  Laterality: N/A;   FUNCTIONAL ENDOSCOPIC SINUS SURGERY     HAMMER TOE SURGERY     HAND SURGERY Right 1998   INGUINAL HERNIA REPAIR Left 03/11/2016   Procedure: LEFT INGUINAL HERINA REPAIR WITH MESH;  Surgeon: Donnice Lima, MD;  Location: [facility] OR;  Service: General;  Laterality: Left;   INSERTION OF MESH Left 03/11/2016   Procedure: INSERTION OF MESH;  Surgeon: Donnice Lima, MD;  Location: [facility];  Service:  General;  Laterality: Left;   IR RADIOLOGIST EVAL & MGMT  07/01/2017   LAMINECTOMY  03/2008   LAPAROTOMY N/A 11/11/2017   Procedure: MINI LAPAROTOMY;  Surgeon: Eloy Herring, MD;  Location: [facility];  Service: Gynecology;  Laterality: N/A;   LUMBAR LAMINECTOMY/DECOMPRESSION MICRODISCECTOMY N/A 12/23/2020   Procedure: Laminectomy and Foraminotomy - Lumbar one-two;  Surgeon: Joshua Alm RAMAN, MD;  Location: [facility] OR;  Service: Neurosurgery;  Laterality: N/A;   POLYPECTOMY     REVERSE SHOULDER ARTHROPLASTY Right 10/13/2018   Procedure: REVERSE SHOULDER ARTHROPLASTY;  Surgeon: Melita Drivers, MD;  Location: [facility];  Service: Orthopedics;  Laterality: Right;    ROBOTIC ASSISTED TOTAL HYSTERECTOMY WITH BILATERAL SALPINGO OOPHERECTOMY Bilateral 11/11/2017   Procedure: XI ROBOTIC ASSISTED TOTAL HYSTERECTOMY WITH BILATERAL SALPINGO OOPHORECTOMY FOR UTERUS GREATER THAN 250 GRAMS;  Surgeon: Eloy Herring, MD;  Location: [facility];  Service: Gynecology;  Laterality: Bilateral;    TONSILLECTOMY     TOTAL HIP ARTHROPLASTY  2008   Right    Her Family History Is Significant For: Family History  Problem Relation Age of Onset   Anuerysm Father 79   Colon cancer Brother 49   Migraines Grandchild     Her Social History Is Significant For: Social History   Socioeconomic History   Marital status: Married    Spouse name: Not on file   Number of children: 2   Years of education: Not on file   Highest education level: Not on file  Occupational History   Occupation: Retired [employer]  Tobacco Use   Smoking status: Never   Smokeless tobacco: Never  Vaping Use   Vaping status: Never Used  Substance and Sexual Activity   Alcohol use: Yes    Comment: 1 glass at dinner   Drug use: Never   Sexual activity: Not Currently  Other Topics Concern   Not on file  Social History Narrative   Diet:  Blank      Do you drink/ eat things with caffeine?  Coffee      Marital status:  Married                             What year were you married ? 1957      Do you live in a house, apartment,assistred living, condo, trailer, etc.)? Apartment-Town House      Is it one or more stories?  One      How many persons live in your home ?  2      Do you have any pets in your home ?(please list) No      Highest Level of education completed: Bachelors Degree      Current or past profession:  RN      Do you exercise?   Yes                           Type & how often  Walking/ Stretching/Daily      ADVANCED DIRECTIVES (Please bring copies)      Do you have a living will? No      Do you have a DNR form?  No                     If not, do you want to discuss one?       Do you have signed POA?HPOA forms?    No  If so, please bring to your appointment      FUNCTIONAL STATUS- To be completed by Spouse / child / Staff       Do you have difficulty bathing or dressing yourself ? No      Do you have difficulty preparing food or eating ? No      Do you have difficulty managing  your mediation ? No      Do you have difficulty managing your finances ? No      Do you have difficulty affording your medication ? No      Social Drivers of Corporate Investment Banker Strain: Low Risk  (08/03/2022)   Overall Financial Resource Strain (CARDIA)    Difficulty of Paying Living Expenses: Not hard at all  Food Insecurity: Low Risk  (12/31/2022)   Received from [facility]   Hunger Vital Sign    Worried About Running Out of Food in the Last Year: Never true    Ran Out of Food in the Last Year: Never true  Transportation Needs: No Transportation Needs (12/31/2022)   Received from [facility]    In the past 12 months, has lack of reliable transportation kept you from medical appointments, meetings, work or from getting things needed for daily living? : No  Physical Activity: Sufficiently Active (08/03/2022)   Exercise Vital Sign    Days of Exercise per Week: 7 days    Minutes of Exercise per Session: 40 min  Stress: No Stress Concern Present (08/03/2022)   [employer] of Occupational Health - Occupational Stress Questionnaire    Feeling of Stress : Only a little  Social Connections: Socially Isolated (08/03/2022)   Social Connection and Isolation Panel [NHANES]    Frequency of Communication with Friends and Family: More than three times a week    Frequency of Social Gatherings with Friends and Family: Once a week    Attends Religious Services: Never    Database Administrator or Organizations: No    Attends Banker Meetings: Never    Marital Status: Widowed    Her Allergies Are:  No Known Allergies:   Her Current Medications Are:  Outpatient Encounter Medications as of 05/13/2023  Medication Sig   atorvastatin  (LIPITOR) 10 MG tablet TAKE 1 TABLET BY MOUTH EVERY DAY   Cholecalciferol (VITAMIN D3 SUPER STRENGTH) 50 MCG (2000 UT) TABS Take 2,000 Units by mouth daily.   clobetasol ointment (TEMOVATE) 0.05 % Apply 1 application topically  daily as needed (vaginitis).   estradiol  (ESTRACE ) 0.5 MG tablet TAKE ONE TABLET BY MOUTH AT BEDTIME   hydrochlorothiazide  (HYDRODIURIL ) 12.5 MG tablet TAKE 1 TABLET BY MOUTH EVERY DAY   latanoprost  (XALATAN ) 0.005 % ophthalmic solution Place 1 drop into both eyes at bedtime.   levothyroxine  (SYNTHROID ) 125 MCG tablet TAKE 1 TABLET BY MOUTH EVERY DAY   losartan  (COZAAR ) 100 MG tablet TAKE 1 TABLET BY MOUTH EVERY DAY   Multiple Vitamin (MULTIVITAMIN) tablet Take 1 tablet by mouth daily with lunch.   traMADol  (ULTRAM ) 50 MG tablet Take 1 tablet (50 mg total) by mouth 2 (two) times daily.   Turmeric (QC TUMERIC COMPLEX PO) Take 1,500 Units by mouth 2 (two) times daily.   vitamin C (ASCORBIC ACID ) 500 MG tablet Take 500 mg by mouth daily with lunch.   escitalopram  (LEXAPRO ) 5 MG tablet Take 1 tablet (5 mg total) by mouth daily. (Patient not taking: Reported on 05/13/2023)   No facility-administered encounter  medications on file as of 05/13/2023.  :   Review of Systems:  Out of a complete 14 point review of systems, all are reviewed and negative with the exception of these symptoms as listed below:  Review of Systems  Neurological:        Pt is here for headaches. Pt states she has daily headaches. States she has had headaches most of her adult like. Pt reports states her headaches are mostly left sided and has no other symptoms.      Objective:  Neurological Exam  Physical Exam Physical Examination:   Vitals:   05/13/23 1400 05/13/23 1411  BP: (!) 144/82 (!) 146/80  Pulse: 84    General Examination: The patient is a very pleasant 89 y.o. female in no acute distress. She appears well-developed and well-nourished and well groomed.   HEENT: Normocephalic, atraumatic, pupils are equal, round and reactive to light, status post cataract repairs.  Funduscopic exam benign.  No photophobia.  Corrective eyeglasses.  Extraocular tracking is good without limitation to gaze excursion or nystagmus  noted. Hearing is grossly intact. Face is symmetric with normal facial animation. Speech is clear with no dysarthria noted. There is no hypophonia. There is no lip, neck/head, jaw or voice tremor. Neck is supple with full range of passive and active motion. There are no carotid bruits on auscultation. Oropharynx exam reveals: mild mouth dryness, adequate dental hygiene and mild airway crowding, due to small airway entry and redundant soft palate.  Tonsils absent.  Mallampati class II.  Chest: Clear to auscultation without wheezing, rhonchi or crackles noted.  Heart: S1+S2+0, regular and normal without murmurs, rubs or gallops noted.   Abdomen: Soft, non-tender and non-distended.  Extremities: There is no pitting edema in the distal lower extremities bilaterally.   Skin: Warm and dry without trophic changes noted.   Musculoskeletal: exam reveals no obvious joint deformities.   Neurologically:  Mental status: The patient is awake, alert and oriented in all 4 spheres. Her immediate and remote memory, attention, language skills and fund of knowledge are appropriate. There is no evidence of aphasia, agnosia, apraxia or anomia. Speech is clear with normal prosody and enunciation. Thought process is linear. Mood is normal and affect is normal.  Cranial nerves II - XII are as described above under HEENT exam.  Motor exam: Thin bulk, normal strength for age, normal tone, no obvious action or resting tremor, no postural tremor.  No rebound.  No drift.   Fine motor skills and coordination: grossly intact.  Cerebellar testing: No dysmetria or intention tremor. There is no truncal or gait ataxia.  Normal finger-to-nose. Reflexes 1+ in the upper extremities and trace in the lower extremities. Sensory exam: intact to light touch in the upper and lower extremities.  Gait, station and balance: She stands easily. No veering to one side is noted. No leaning to one side is noted. Posture is age-appropriate and  stance is narrow based. Gait shows normal stride length and normal pace. No problems turning are noted.   Assessment and Plan:  In summary, Carol Wells is a very pleasant 89 y.o.-year old female with an underlying medical history of hyperlipidemia, hypertension, glaucoma, hypothyroidism, anemia, arthritis, carotid stenosis, cataracts, diverticulosis, spinal stenosis, cervical spondylosis, allergic rhinitis, history of epistaxis with status post cautery patient under Dr. Karis in 2016, chronic sphenoidal sinusitis, migraine headaches, and vertigo who presents for evaluation of her recurrent headaches of several years duration.  Her current headache is likely from a combination  of underlying factors including cervicogenic headaches, migrainous headaches, sinus pressure headaches, stress related headaches and sleep disordered breathing related headaches not excluded.  I had a long discussion with this patient today.  This was an extended visit of over 60 minutes with copious record review involved, including ENT records and primary care records, review of multiple scans and considerable counseling and coordination of care involved.  We talked about multiple medication options for headache prevention and acute management.  She is not a candidate for nonsteroidal anti-inflammatory medication due to history of gastric ulcer and gastritis.  She is not keen on starting any medication that could put her at risk for sedation or balance issues which is completely understandable, I think we have to be very cautious with medication management.  She has already tried gabapentin  and Topamax  in the past and took several different triptans over the course of time. She is encouraged to start the Lexapro  which was just prescribed by her PCP yesterday.  We talked about expectations and possible common side effects of Lexapro  at length today.  She is encouraged to give it a try as it is low dose and is not that difficult to come  off of it if she needs to come off of it.  She has an appointment coming up with her PCP relatively soon as well.  She is reminded to stay well-hydrated and well rested and proceed with a home sleep test for evaluation for underlying possible sleep apnea.  She may be at risk.  If she has obstructive sleep apnea, she is advised to consider PAP therapy in the form of an AutoPap machine.  She would be willing to try.  We will plan to follow-up after sleep testing.  I explained the home sleep test procedure to her.  She had recent scans which we discussed. I answered all her questions today and she was in agreement with our approach.    Thank you very much for allowing me to participate in the care of this nice patient. If I can be of any further assistance to you please do not hesitate to call me at [phone].  Sincerely,   True Mar, MD, PhD

## 2023-05-26 ENCOUNTER — Non-Acute Institutional Stay: Payer: Medicare Other | Admitting: Internal Medicine

## 2023-05-26 ENCOUNTER — Encounter: Payer: Self-pay | Admitting: Internal Medicine

## 2023-05-26 VITALS — BP 148/98 | HR 83 | Temp 98.7°F | Resp 16 | Ht 61.0 in | Wt 121.0 lb

## 2023-05-26 DIAGNOSIS — M542 Cervicalgia: Secondary | ICD-10-CM | POA: Diagnosis not present

## 2023-05-26 DIAGNOSIS — E785 Hyperlipidemia, unspecified: Secondary | ICD-10-CM

## 2023-05-26 DIAGNOSIS — I10 Essential (primary) hypertension: Secondary | ICD-10-CM | POA: Diagnosis not present

## 2023-05-26 DIAGNOSIS — G4452 New daily persistent headache (NDPH): Secondary | ICD-10-CM | POA: Diagnosis not present

## 2023-05-26 DIAGNOSIS — E039 Hypothyroidism, unspecified: Secondary | ICD-10-CM

## 2023-05-26 NOTE — Progress Notes (Signed)
[facility] Service:  Clinic (12)  Provider:   Code Status:  Goals of Care:     05/26/2023    8:39 AM  Advanced Directives  Does Patient Have a Medical Advance Directive? Yes  Type of Estate agent of [place];Living will;Out of facility DNR (pink MOST or yellow form)  Does patient want to make changes to medical advance directive? No - Patient declined  Copy of Healthcare Power of Attorney in Chart? No - copy requested     Chief Complaint  Patient presents with   Medical Management of Chronic Issues    Patient is being seen [facility] 2 week follow up    HPI: Patient is a 89 y.o. female seen today for an acute visit for Follow up of her headaches  Patient with Chronic Headaches with recent worsening came for follow up [place] [facility]  Discussed the use of AI scribe software for clinical note transcription with the patient, who gave verbal consent to proceed.  History of Present Illness   The patient, with a history of chronic headaches and neck pain,  Seen by Neurology  suggested a sleep study to rule out sleep apnea as a potential contributing factor to the headaches. The patient recalls a history of snoring, but notes that this has not been an issue in recent years. Also told her to continue Lexapro The patient has been on Lexapro for two weeks, which was prescribed to help with anxiety. However, she reports feeling more nervous and shaky since starting the medication, and expresses a desire to discontinue it.   managing her headaches with Tramadol, which she reports has been effective. She typically takes one dose in the morning and another around noon if the headache persists. She believes her headaches are primarily due to issues with her neck, The patient also reports inconsistent blood pressure readings, with higher readings in the clinic compared to at home. She has been monitoring her blood pressure at home, typically  recording readings in the 130s over 70s to 80s.  The patient also reports some tingling sensation from the neck down, w She has a history of shoulder replacement and arthritis, which she believes may contribute to her neck issues and subsequent headaches.  Other history  Patient also has a history of , arthritis, hypothyroidism, atrophic vaginitis, hyperlipidemia and hyponatremia Patient also has a history of gastric ulcer, gastritis and collagenous colitis. Had tapered herself off Protonix and budesonide S/p Lumbar Laminectomy in 12/2020 has recovered Past Medical History:  Diagnosis Date   Anemia 11/2015   Arthritis    Blood transfusion 2017 JULY 22   Carotid stenosis    Dopplers 3/14: RICA 40-59%, LICA 0-39%   Cataract    REMOVED   Chronic headaches    HX MIGRAINES   Collagenous colitis    Diverticulosis 2013   Colonoscopy   Gastric ulcer 11/2015   Glaucoma    BOTH EYES   Hiatal hernia    History of spinal stenosis    History of uterine fibroid    Hx of cardiac catheterization    LHC 3/14: no angiographic CAD, EF 65%   Hx of echocardiogram    Echocardiogram 06/21/12: Mild LVH, EF 60-65%, grade 2 diastolic dysfunction, mild MR   Hyperlipidemia    Hypertension    Hypothyroidism    Internal hemorrhoids 2009   Colonoscopy   Migraines    UTI (urinary tract infection) HX OF  Past Surgical History:  Procedure Laterality Date   BREAST BIOPSY Right    BUNIONECTOMY  YRS AGO LEFT   RIGHT [facility] 3 YRS AGO   BUNIONECTOMY Left    CATARACT EXTRACTION W/ INTRAOCULAR LENS IMPLANT  03/2010   Left   CATARACT EXTRACTION W/ INTRAOCULAR LENS IMPLANT Bilateral 11/2010   COLONOSCOPY  2013   NORMAL    ESOPHAGOGASTRODUODENOSCOPY N/A 11/23/2015   Procedure: ESOPHAGOGASTRODUODENOSCOPY (EGD);  Surgeon: Malissa Hippo, MD;  [facility];  Service: Endoscopy;  Laterality: N/A;   EUS N/A 12/19/2015   Procedure: ESOPHAGEAL ENDOSCOPIC ULTRASOUND (EUS) RADIAL;  Surgeon:  Rachael Fee, MD;  Location: [facility];  Service: Endoscopy;  Laterality: N/A;   FUNCTIONAL ENDOSCOPIC SINUS SURGERY     HAMMER TOE SURGERY     HAND SURGERY Right 1998   INGUINAL HERNIA REPAIR Left 03/11/2016   Procedure: LEFT INGUINAL HERINA REPAIR WITH MESH;  Surgeon: Manus Rudd, MD;  Location: [facility] OR;  Service: General;  Laterality: Left;   INSERTION OF MESH Left 03/11/2016   Procedure: INSERTION OF MESH;  Surgeon: Manus Rudd, MD;  Location: [facility] OR;  Service: General;  Laterality: Left;   IR RADIOLOGIST EVAL & MGMT  07/01/2017   LAMINECTOMY  03/2008   LAPAROTOMY N/A 11/11/2017   Procedure: MINI LAPAROTOMY;  Surgeon: Adolphus Birchwood, MD;  Location: [facility] ORS;  Service: Gynecology;  Laterality: N/A;   LUMBAR LAMINECTOMY/DECOMPRESSION MICRODISCECTOMY N/A 12/23/2020   Procedure: Laminectomy and Foraminotomy - Lumbar one-two;  Surgeon: Tia Alert, MD;  Location: [facility] OR;  Service: Neurosurgery;  Laterality: N/A;   POLYPECTOMY     REVERSE SHOULDER ARTHROPLASTY Right 10/13/2018   Procedure: REVERSE SHOULDER ARTHROPLASTY;  Surgeon: Francena Hanly, MD;  Location: [facility];  Service: Orthopedics;  Laterality: Right;    ROBOTIC ASSISTED TOTAL HYSTERECTOMY WITH BILATERAL SALPINGO OOPHERECTOMY Bilateral 11/11/2017   Procedure: XI ROBOTIC ASSISTED TOTAL HYSTERECTOMY WITH BILATERAL SALPINGO OOPHORECTOMY FOR UTERUS GREATER THAN 250 GRAMS;  Surgeon: Adolphus Birchwood, MD;  Location: [facility];  Service: Gynecology;  Laterality: Bilateral;   TONSILLECTOMY     TOTAL HIP ARTHROPLASTY  2008   Right    No Known Allergies  Outpatient Encounter Medications as of 05/26/2023  Medication Sig   atorvastatin (LIPITOR) 10 MG tablet TAKE 1 TABLET BY MOUTH EVERY DAY   Cholecalciferol (VITAMIN D3 SUPER STRENGTH) 50 MCG (2000 UT) TABS Take 2,000 Units by mouth daily.   clobetasol ointment (TEMOVATE) 0.05 % Apply 1 application topically daily as needed (vaginitis).   estradiol (ESTRACE) 0.5 MG tablet TAKE ONE TABLET  BY MOUTH AT BEDTIME   hydrochlorothiazide (HYDRODIURIL) 12.5 MG tablet TAKE 1 TABLET BY MOUTH EVERY DAY   latanoprost (XALATAN) 0.005 % ophthalmic solution Place 1 drop into both eyes at bedtime.   levothyroxine (SYNTHROID) 125 MCG tablet TAKE 1 TABLET BY MOUTH EVERY DAY   losartan (COZAAR) 100 MG tablet TAKE 1 TABLET BY MOUTH EVERY DAY   Multiple Vitamin (MULTIVITAMIN) tablet Take 1 tablet by mouth daily with lunch.   traMADol (ULTRAM) 50 MG tablet Take 1 tablet (50 mg total) by mouth 2 (two) times daily.   Turmeric (QC TUMERIC COMPLEX PO) Take 1,500 Units by mouth 2 (two) times daily.   vitamin C (ASCORBIC ACID) 500 MG tablet Take 500 mg by mouth daily with lunch.   [DISCONTINUED] escitalopram (LEXAPRO) 5 MG tablet Take 1 tablet (5 mg total) by mouth daily.   No facility-administered encounter medications on file as of 05/26/2023.  Review of Systems:  Review of Systems  Constitutional:  Negative for activity change and appetite change.  HENT: Negative.    Respiratory:  Negative for cough and shortness of breath.   Cardiovascular:  Negative for leg swelling.  Gastrointestinal:  Negative for constipation.  Genitourinary: Negative.   Musculoskeletal:  Positive for arthralgias, neck pain and neck stiffness. Negative for gait problem and myalgias.  Skin: Negative.   Neurological:  Positive for headaches. Negative for dizziness and weakness.  Psychiatric/Behavioral:  Negative for confusion, dysphoric mood and sleep disturbance.     Health Maintenance  Topic Date Due   Zoster Vaccines- Shingrix (1 of 2) 08/22/1950   COVID-19 Vaccine (5 - 2024-25 season) 06/11/2023 (Originally 04/14/2023)   Medicare Annual Wellness (AWV)  08/03/2023   DTaP/Tdap/Td (2 - Td or Tdap) 06/13/2030   Pneumonia Vaccine 66+ Years old  Completed   INFLUENZA VACCINE  Completed   DEXA SCAN  Completed   HPV VACCINES  Aged Out    Physical Exam: Vitals:   05/26/23 0836  BP: (!) 148/98  Pulse: 83  Resp: 16   Temp: 98.7 F (37.1 C)  TempSrc: Temporal  SpO2: 97%  Weight: 121 lb (54.9 kg)  Height: 5\' 1"  (1.549 m)   Body mass index is 22.86 kg/m. Physical Exam Vitals reviewed.  Constitutional:      Appearance: Normal appearance.  HENT:     Head: Normocephalic.     Nose: Nose normal.     Mouth/Throat:     Mouth: Mucous membranes are moist.     Pharynx: Oropharynx is clear.  Eyes:     Pupils: Pupils are equal, round, and reactive to light.  Cardiovascular:     Rate and Rhythm: Normal rate and regular rhythm.     Pulses: Normal pulses.     Heart sounds: Normal heart sounds. No murmur heard. Pulmonary:     Effort: Pulmonary effort is normal.     Breath sounds: Normal breath sounds.  Abdominal:     General: Abdomen is flat. Bowel sounds are normal.     Palpations: Abdomen is soft.  Musculoskeletal:        General: No swelling.     Cervical back: Neck supple.  Skin:    General: Skin is warm.  Neurological:     General: No focal deficit present.     Mental Status: She is alert and oriented to person, place, and time.  Psychiatric:        Mood and Affect: Mood normal.        Thought Content: Thought content normal.     Labs reviewed: Basic Metabolic Panel: Recent Labs    07/06/22 0749 02/25/23 0800  NA 138 135  K 4.2 4.4  CL 100 97*  CO2 27 30  GLUCOSE 77 88  BUN 14 17  CREATININE 0.66 0.69  CALCIUM 8.9 9.0  TSH 1.85 2.82   Liver Function Tests: Recent Labs    07/06/22 0749 02/25/23 0800  AST 30 33  ALT 20 21  BILITOT 0.7 0.6  PROT 6.8 7.0   No results for input(s): "LIPASE", "AMYLASE" in the last 8760 hours. No results for input(s): "AMMONIA" in the last 8760 hours. CBC: Recent Labs    07/06/22 0749 02/25/23 0800  WBC 5.1 4.3  NEUTROABS 2,795 2,012  HGB 12.7 12.7  HCT 36.9 39.6  MCV 87.4 91.7  PLT 111* 110*   Lipid Panel: Recent Labs    07/06/22 0749 02/25/23 0800  CHOL 138  148  HDL 60 59  LDLCALC 66 75  TRIG 50 48  CHOLHDL 2.3 2.5    No results found for: "HGBA1C"  Procedures since last visit: No results found.  Assessment/Plan 1. New daily persistent headache (Primary) MRI negative Just Sphenoid Sinusitis Failed Topamax Does not want to take Lexapro Use Tramadol BID PRN whih is working for her 2. Primary hypertension BP slightly high Monitor blood pressure at home, especially during severe headaches. -Consider restarting Amlodipine 2.5mg  if blood pressure consistently elevate She has Prescription already 3. Neck pain Tramadol and Try Heating Pad 4 Numbness in the Hands and Fingers Has failed Gabapentin Does not want anything else  Labs/tests ordered:  * No order type specified * Next appt:  07/14/2023

## 2023-06-12 ENCOUNTER — Other Ambulatory Visit: Payer: Self-pay | Admitting: Internal Medicine

## 2023-06-12 DIAGNOSIS — E785 Hyperlipidemia, unspecified: Secondary | ICD-10-CM

## 2023-06-15 ENCOUNTER — Ambulatory Visit (INDEPENDENT_AMBULATORY_CARE_PROVIDER_SITE_OTHER): Payer: Medicare Other | Admitting: Neurology

## 2023-06-15 DIAGNOSIS — R0683 Snoring: Secondary | ICD-10-CM

## 2023-06-15 DIAGNOSIS — G43009 Migraine without aura, not intractable, without status migrainosus: Secondary | ICD-10-CM

## 2023-06-15 DIAGNOSIS — G4733 Obstructive sleep apnea (adult) (pediatric): Secondary | ICD-10-CM | POA: Diagnosis not present

## 2023-06-15 DIAGNOSIS — Z820 Family history of epilepsy and other diseases of the nervous system: Secondary | ICD-10-CM

## 2023-06-15 DIAGNOSIS — R519 Headache, unspecified: Secondary | ICD-10-CM

## 2023-06-15 DIAGNOSIS — Z9189 Other specified personal risk factors, not elsewhere classified: Secondary | ICD-10-CM

## 2023-06-15 DIAGNOSIS — G4486 Cervicogenic headache: Secondary | ICD-10-CM

## 2023-06-16 NOTE — Progress Notes (Unsigned)
Carol Wells

## 2023-06-18 NOTE — Addendum Note (Signed)
Addended by: Huston Foley on: 06/18/2023 01:46 PM   Modules accepted: Orders

## 2023-06-18 NOTE — Procedures (Signed)
[facility]  HOME SLEEP TEST (Watch PAT) REPORT  STUDY DATE: 06/15/23  DOB: [birth year]  MRN: [id number]  ORDERING CLINICIAN: Huston Foley, MD, PhD   REFERRING CLINICIAN: Tressia Miners, NP  CLINICAL INFORMATION/HISTORY: 89 year old female with an underlying medical history of hyperlipidemia, hypertension, glaucoma, hypothyroidism, anemia, arthritis, carotid stenosis, cataracts, diverticulosis, spinal stenosis, cervical spondylosis, allergic rhinitis, history of epistaxis with status post cautery patient under Dr. Suszanne Conners in 2016, chronic sphenoidal sinusitis, migraine headaches, and vertigo who reports recurrent headaches, snoring and morning headaches.   BMI: 23.1 kg/m  FINDINGS:   Sleep Summary:   Total Recording Time (hours, min): 8 hours, 8 min  Total Sleep Time (hours, min):  7 hours, 32 min  Percent REM (%):    25.3%   Respiratory Indices:   Calculated pAHI (per hour):  14.9/hour         REM pAHI:    30.3/hour       NREM pAHI: 9.9/hour  Central pAHI: 0/hour  Oxygen Saturation Statistics:    Oxygen Saturation (%) Mean: 93%   Minimum oxygen saturation (%):                 85%   O2 Saturation Range (%): 85-98%    O2 Saturation (minutes) <=88%: 0.9 min  Pulse Rate Statistics:   Pulse Mean (bpm):    63/min    Pulse Range (52-96/min)   IMPRESSION: OSA (obstructive sleep apnea), mild    RECOMMENDATION:  This home sleep test demonstrates overall mild obstructive sleep apnea with a total AHI of 14.9/hour and O2 nadir of 85%. Snoring was detected, in the mild to moderate range. Given the patient's medical history and sleep related complaints, therapy with a  positive airway pressure device is recommended.  Treatment can be achieved in the form of autoPAP trial/titration at home for now. A full night, in-lab PAP titration study may aid in improving proper treatment settings and with mask fit, if needed, down the road. Alternative treatments may  include weight loss (where appropriate) along with avoidance of the supine sleep position (if possible), or an oral appliance in appropriate candidates.   Please note that untreated obstructive sleep apnea may carry additional perioperative morbidity. Patients with significant obstructive sleep apnea should receive perioperative PAP therapy and the surgeons and particularly the anesthesiologist should be informed of the diagnosis and the severity of the sleep disordered breathing. The patient should be cautioned not to drive, work at heights, or operate dangerous or heavy equipment when tired or sleepy. Review and reiteration of good sleep hygiene measures should be pursued with any patient. Other causes of the patient's symptoms, including circadian rhythm disturbances, an underlying mood disorder, medication effect and/or an underlying medical problem cannot be ruled out based on this test. Clinical correlation is recommended.  The patient and her referring provider will be notified of the test results. The patient will be seen in follow up in sleep clinic at [facility], as necessary.  I certify that I have reviewed the raw data recording prior to the issuance of this report in accordance with the standards of the American Academy of Sleep Medicine (AASM).    INTERPRETING PHYSICIAN:   Huston Foley, MD, PhD Medical Director, [facility]) Diplomat, ABPN (Neurology and Sleep)   [facility] [address] [place], [place] [address] [phone]

## 2023-06-20 ENCOUNTER — Other Ambulatory Visit: Payer: Self-pay | Admitting: Internal Medicine

## 2023-06-21 NOTE — Telephone Encounter (Signed)
 High Risk Warning Populated when attempting to refill, I will send to Provider for further review

## 2023-06-23 ENCOUNTER — Telehealth: Payer: Self-pay | Admitting: *Deleted

## 2023-06-23 NOTE — Telephone Encounter (Signed)
Tried to reach pt to go over sleep study results. LVM advising pt we will try to call her back another time. We are currently closed due to inclement weather. She can also call us back tomorrow AM from 8 to 12. Left office number in message.

## 2023-06-23 NOTE — Telephone Encounter (Signed)
-----   Message from Huston Foley sent at 06/18/2023  1:46 PM EST ----- Patient referred by ENT, seen by me on 05/13/2023, patient had HST on 06/15/2023.    Please call and notify the patient that the recent home sleep test showed obstructive sleep apnea. OSA is overall mild, but worth treating to see if she feels better after treatment, particularly with regards to her recurrent headaches. To that end I recommend treatment for this in the form of autoPAP, which means, that we don't have to bring her in for a sleep study with CPAP, but will let her try an autoPAP machine at home, through a DME company (of her choice, or as per insurance requirement). The DME representative will educate her on how to use the machine, how to put the mask on, etc. I have placed an order in the chart. Please send referral, talk to patient, send report to referring MD. We will need a FU in sleep clinic in about 2.-3 months post-PAP set up (which is usually an insurance-mandated appointment to monitor compliance), please arrange that with me or one of our NPs. Please also go over the need for compliance with treatment (including the insurance-imposed minimum compliance percentage). Thanks,   Huston Foley, MD, PhD [facility])

## 2023-06-24 NOTE — Telephone Encounter (Signed)
 Zott, Linnell Fulling, Otilio Jefferson, RN; [place], [place] Got It Thank  You

## 2023-06-24 NOTE — Telephone Encounter (Signed)
I spoke with the patient and discussed her sleep study results as noted below by Dr. Frances Furbish.  The patient verbalized understanding and she is amenable to trialing AutoPap.  We discussed the insurance compliance requirements which includes using the machine at least 4 hours at night and also being seen by our office between 30 and 90 days after set up.  Her questions were answered.  We will refer her to [facility] as she did not have a preference for DME.  Patient will watch for a call from them within 1 week.  Referral sent to [facility].  Sleep study report sent to referring provider.

## 2023-07-07 ENCOUNTER — Encounter: Payer: Medicare Other | Admitting: Internal Medicine

## 2023-07-12 ENCOUNTER — Ambulatory Visit: Payer: Medicare Other | Admitting: Neurology

## 2023-07-14 ENCOUNTER — Encounter: Payer: Medicare Other | Admitting: Internal Medicine

## 2023-07-21 ENCOUNTER — Other Ambulatory Visit: Payer: Self-pay | Admitting: Internal Medicine

## 2023-08-03 ENCOUNTER — Encounter: Payer: Self-pay | Admitting: Internal Medicine

## 2023-08-03 NOTE — Telephone Encounter (Signed)
error 

## 2023-08-19 ENCOUNTER — Other Ambulatory Visit: Payer: Medicare Other

## 2023-08-19 DIAGNOSIS — E785 Hyperlipidemia, unspecified: Secondary | ICD-10-CM | POA: Diagnosis not present

## 2023-08-19 DIAGNOSIS — E039 Hypothyroidism, unspecified: Secondary | ICD-10-CM | POA: Diagnosis not present

## 2023-08-19 DIAGNOSIS — I10 Essential (primary) hypertension: Secondary | ICD-10-CM | POA: Diagnosis not present

## 2023-08-20 LAB — COMPREHENSIVE METABOLIC PANEL WITH GFR
AG Ratio: 1.4 (calc) (ref 1.0–2.5)
ALT: 21 U/L (ref 6–29)
AST: 33 U/L (ref 10–35)
Albumin: 4.2 g/dL (ref 3.6–5.1)
Alkaline phosphatase (APISO): 54 U/L (ref 37–153)
BUN: 15 mg/dL (ref 7–25)
CO2: 32 mmol/L (ref 20–32)
Calcium: 9 mg/dL (ref 8.6–10.4)
Chloride: 98 mmol/L (ref 98–110)
Creat: 0.61 mg/dL (ref 0.60–0.95)
Globulin: 2.9 g/dL (ref 1.9–3.7)
Glucose, Bld: 84 mg/dL (ref 65–99)
Potassium: 4.3 mmol/L (ref 3.5–5.3)
Sodium: 135 mmol/L (ref 135–146)
Total Bilirubin: 0.7 mg/dL (ref 0.2–1.2)
Total Protein: 7.1 g/dL (ref 6.1–8.1)
eGFR: 85 mL/min/{1.73_m2} (ref >=60)

## 2023-08-20 LAB — LIPID PANEL
Cholesterol: 148 mg/dL (ref <200)
HDL: 65 mg/dL (ref >=50)
LDL Cholesterol (Calc): 68 mg/dL
Non-HDL Cholesterol (Calc): 83 mg/dL (ref <130)
Total CHOL/HDL Ratio: 2.3 (calc) (ref <5.0)
Triglycerides: 72 mg/dL (ref <150)

## 2023-08-20 LAB — CBC WITH DIFFERENTIAL/PLATELET
Absolute Lymphocytes: 1568 {cells}/uL (ref 850–3900)
Absolute Monocytes: 382 {cells}/uL (ref 200–950)
Basophils Absolute: 20 {cells}/uL (ref 0–200)
Basophils Relative: 0.4 %
Eosinophils Absolute: 88 {cells}/uL (ref 15–500)
Eosinophils Relative: 1.8 %
HCT: 37.6 % (ref 35.0–45.0)
Hemoglobin: 12.2 g/dL (ref 11.7–15.5)
MCH: 29.8 pg (ref 27.0–33.0)
MCHC: 32.4 g/dL (ref 32.0–36.0)
MCV: 91.9 fL (ref 80.0–100.0)
Monocytes Relative: 7.8 %
Neutro Abs: 2842 {cells}/uL (ref 1500–7800)
Neutrophils Relative %: 58 %
RBC: 4.09 10*6/uL (ref 3.80–5.10)
RDW: 11.8 % (ref 11.0–15.0)
Total Lymphocyte: 32 %
WBC: 4.9 10*3/uL (ref 3.8–10.8)

## 2023-08-20 LAB — TSH: TSH: 2.45 m[IU]/L (ref 0.40–4.50)

## 2023-08-25 ENCOUNTER — Non-Acute Institutional Stay: Payer: Medicare Other | Admitting: Internal Medicine

## 2023-08-25 ENCOUNTER — Encounter: Payer: Self-pay | Admitting: Internal Medicine

## 2023-08-25 VITALS — BP 118/78 | HR 76 | Temp 97.0°F | Resp 18 | Ht 61.0 in | Wt 130.7 lb

## 2023-08-25 DIAGNOSIS — I10 Essential (primary) hypertension: Secondary | ICD-10-CM | POA: Diagnosis not present

## 2023-08-25 DIAGNOSIS — G4452 New daily persistent headache (NDPH): Secondary | ICD-10-CM | POA: Diagnosis not present

## 2023-08-25 DIAGNOSIS — E785 Hyperlipidemia, unspecified: Secondary | ICD-10-CM | POA: Diagnosis not present

## 2023-08-25 DIAGNOSIS — E039 Hypothyroidism, unspecified: Secondary | ICD-10-CM

## 2023-08-25 DIAGNOSIS — M7989 Other specified soft tissue disorders: Secondary | ICD-10-CM

## 2023-08-25 DIAGNOSIS — M542 Cervicalgia: Secondary | ICD-10-CM

## 2023-08-25 MED ORDER — POTASSIUM CHLORIDE CRYS ER 10 MEQ PO TBCR: 10.0000 meq | EXTENDED_RELEASE_TABLET | Freq: Every day | ORAL | 3 refills | Status: DC

## 2023-08-25 MED ORDER — AMLODIPINE BESYLATE 2.5 MG PO TABS: 2.5000 mg | ORAL_TABLET | Freq: Every day | ORAL | 3 refills | Status: DC

## 2023-08-25 NOTE — Patient Instructions (Signed)
 Labs work done on May 5th at [facility] at 7:45am

## 2023-08-25 NOTE — Progress Notes (Unsigned)
 Location:  [facility] of Service:  Clinic (12)  Provider:   Code Status: DNR Goals of Care:     08/25/2023    1:04 PM  Advanced Directives  Does Patient Have a Medical Advance Directive? No  Would patient like information on creating a medical advance directive? No - Patient declined     Chief Complaint  Patient presents with   Medical Management of Chronic Issues     3 month follow up    HPI: Patient is a 89 y.o. female seen today for medical management of chronic diseases.    [place]ives [place] in [facility] Patient also has a history of , arthritis, hypothyroidism, atrophic vaginitis, hyperlipidemia and hyponatremia Patient also has a history of gastric ulcer, gastritis and collagenous colitis. Had tapered herself off Protonix  and budesonide  S/p Lumbar Laminectomy in 12/2020    Acute issues  Discussed the use of AI scribe software for clinical note transcription with the patient, who gave verbal consent to proceed.  History of Present Illness   Mild sleep apnea  New Diagnosis managed with CPAP  Headaches  The patient reports that the CPAP therapy is not helping with the headaches, which she describes as being worse than before. The headaches are present throughout the day and are often present upon waking . The patient believes the headaches are related to her neck issues, but injections for the same have not provided relief. HAS seen Ortho and ENT Takes tramadol  which helps but little Robaxin  and Tylenol  did not help   LE edema The patient also reports swelling in her right ankle that started about three weeks ago. The swelling does not reduce overnight and the patient describes the leg as feeling heavy. The patient has a history of venous insufficiency and is currently on Amlodipine  and Hydrochlorothiazide . Doppler in the past has been Negative  The patient is active, participating in exercise five days a week, despite the discomfort.      Past Medical  History:  Diagnosis Date   Anemia 11/2015   Arthritis    Blood transfusion 2017 JULY 22   Carotid stenosis    Dopplers 3/14: RICA 40-59%, LICA 0-39%   Cataract    REMOVED   Chronic headaches    HX MIGRAINES   Collagenous colitis    Diverticulosis 2013   Colonoscopy   Gastric ulcer 11/2015   Glaucoma    BOTH EYES   Hiatal hernia    History of spinal stenosis    History of uterine fibroid    Hx of cardiac catheterization    LHC 3/14: no angiographic CAD, EF 65%   Hx of echocardiogram    Echocardiogram 06/21/12: Mild LVH, EF 60-65%, grade 2 diastolic dysfunction, mild MR   Hyperlipidemia    Hypertension    Hypothyroidism    Internal hemorrhoids 2009   Colonoscopy   Migraines    UTI (urinary tract infection) HX OF    Past Surgical History:  Procedure Laterality Date   BREAST BIOPSY Right    BUNIONECTOMY  YRS AGO LEFT   RIGHT CONE AT [facility] 3 YRS AGO   BUNIONECTOMY Left    CATARACT EXTRACTION W/ INTRAOCULAR LENS IMPLANT  03/2010   Left   CATARACT EXTRACTION W/ INTRAOCULAR LENS IMPLANT Bilateral 11/2010   COLONOSCOPY  2013   NORMAL    ESOPHAGOGASTRODUODENOSCOPY N/A 11/23/2015   Procedure: ESOPHAGOGASTRODUODENOSCOPY (EGD);  Surgeon: Ruby Corporal, MD;  [facility];  Service: Endoscopy;  Laterality: N/A;   EUS N/A 12/19/2015   Procedure: ESOPHAGEAL ENDOSCOPIC ULTRASOUND (EUS) RADIAL;  Surgeon: Janel Medford, MD;  [facility];  Service: Endoscopy;  Laterality: N/A;   FUNCTIONAL ENDOSCOPIC SINUS SURGERY     HAMMER TOE SURGERY     HAND SURGERY Right 1998   INGUINAL HERNIA REPAIR Left 03/11/2016   Procedure: LEFT INGUINAL HERINA REPAIR WITH MESH;  Surgeon: Dareen Ebbing, MD;  [facility];  Service: General;  Laterality: Left;   INSERTION OF MESH Left 03/11/2016   Procedure: INSERTION OF MESH;  Surgeon: Dareen Ebbing, MD;  [facility];  Service: General;  Laterality: Left;   IR RADIOLOGIST EVAL & MGMT  07/01/2017   LAMINECTOMY  03/2008    LAPAROTOMY N/A 11/11/2017   Procedure: MINI LAPAROTOMY;  Surgeon: Alphonso Aschoff, MD;  [facility];  Service: Gynecology;  Laterality: N/A;   LUMBAR LAMINECTOMY/DECOMPRESSION MICRODISCECTOMY N/A 12/23/2020   Procedure: Laminectomy and Foraminotomy - Lumbar one-two;  Surgeon: Isadora Mar, MD;  Location: [facility] OR;  Service: Neurosurgery;  Laterality: N/A;   POLYPECTOMY     REVERSE SHOULDER ARTHROPLASTY Right 10/13/2018   Procedure: REVERSE SHOULDER ARTHROPLASTY;  Surgeon: Ellard Gunning, MD;  [facility];  Service: Orthopedics;  Laterality: Right;    ROBOTIC ASSISTED TOTAL HYSTERECTOMY WITH BILATERAL SALPINGO OOPHERECTOMY Bilateral 11/11/2017   Procedure: XI ROBOTIC ASSISTED TOTAL HYSTERECTOMY WITH BILATERAL SALPINGO OOPHORECTOMY FOR UTERUS GREATER THAN 250 GRAMS;  Surgeon: Alphonso Aschoff, MD;  [facility];  Service: Gynecology;  Laterality: Bilateral;   TONSILLECTOMY     TOTAL HIP ARTHROPLASTY  2008   Right    No Known Allergies  Outpatient Encounter Medications as of 08/25/2023  Medication Sig   amLODipine  (NORVASC ) 5 MG tablet Take 2.5 mg by mouth daily.   atorvastatin  (LIPITOR) 10 MG tablet TAKE 1 TABLET BY MOUTH EVERY DAY   Cholecalciferol (VITAMIN D3 SUPER STRENGTH) 50 MCG (2000 UT) TABS Take 2,000 Units by mouth daily.   clobetasol ointment (TEMOVATE) 0.05 % Apply 1 application topically daily as needed (vaginitis).   estradiol  (ESTRACE ) 0.5 MG tablet TAKE ONE TABLET BY MOUTH AT BEDTIME   hydrochlorothiazide  (HYDRODIURIL ) 12.5 MG tablet TAKE 1 TABLET BY MOUTH EVERY DAY   latanoprost  (XALATAN ) 0.005 % ophthalmic solution Place 1 drop into both eyes at bedtime.   levothyroxine  (SYNTHROID ) 125 MCG tablet TAKE 1 TABLET BY MOUTH EVERY DAY   losartan  (COZAAR ) 100 MG tablet TAKE 1 TABLET BY MOUTH EVERY DAY   Multiple Vitamin (MULTIVITAMIN) tablet Take 1 tablet by mouth daily with lunch.   traMADol  (ULTRAM ) 50 MG tablet Take 1 tablet (50 mg total) by mouth 2 (two) times daily.    Turmeric (QC TUMERIC COMPLEX PO) Take 1,500 Units by mouth 2 (two) times daily.   vitamin C (ASCORBIC ACID ) 500 MG tablet Take 500 mg by mouth daily with lunch.   No facility-administered encounter medications on file as of 08/25/2023.    Review of Systems:  Review of Systems  Constitutional:  Negative for activity change and appetite change.  HENT: Negative.    Respiratory:  Negative for cough and shortness of breath.   Cardiovascular:  Negative for leg swelling.  Gastrointestinal:  Negative for constipation.  Genitourinary: Negative.   Musculoskeletal:  Positive for neck pain and neck stiffness. Negative for arthralgias, gait problem and myalgias.  Skin: Negative.   Neurological:  Positive for headaches. Negative for dizziness and weakness.  Psychiatric/Behavioral:  Negative for confusion, dysphoric mood and sleep disturbance.  Health Maintenance  Topic Date Due   Zoster Vaccines- Shingrix (1 of 2) 08/22/1950   COVID-19 Vaccine (5 - 2024-25 season) 04/14/2023   Medicare Annual Wellness (AWV)  08/03/2023   INFLUENZA VACCINE  12/03/2023   DTaP/Tdap/Td (2 - Td or Tdap) 06/13/2030   Pneumonia Vaccine 72+ Years old  Completed   DEXA SCAN  Completed   HPV VACCINES  Aged Out   Meningococcal B Vaccine  Aged Out    Physical Exam: Vitals:   08/25/23 1306  BP: 118/78  Pulse: 76  Resp: 18  Temp: (!) 97 F (36.1 C)  SpO2: 97%  Weight: 130 lb 11.2 oz (59.3 kg)  Height: 5\' 1"  (1.549 m)   Body mass index is 24.7 kg/m. Physical Exam Vitals reviewed.  Constitutional:      Appearance: Normal appearance.  HENT:     Head: Normocephalic.     Nose: Nose normal.     Mouth/Throat:     Mouth: Mucous membranes are moist.     Pharynx: Oropharynx is clear.  Eyes:     Pupils: Pupils are equal, round, and reactive to light.  Cardiovascular:     Rate and Rhythm: Normal rate and regular rhythm.     Pulses: Normal pulses.     Heart sounds: Normal heart sounds. No murmur  heard. Pulmonary:     Effort: Pulmonary effort is normal.     Breath sounds: Normal breath sounds.  Abdominal:     General: Abdomen is flat. Bowel sounds are normal.     Palpations: Abdomen is soft.  Musculoskeletal:     Cervical back: Neck supple.     Comments: Right Leg Swelling present  Skin:    General: Skin is warm.  Neurological:     General: No focal deficit present.     Mental Status: She is alert and oriented to person, place, and time.  Psychiatric:        Mood and Affect: Mood normal.        Thought Content: Thought content normal.     Labs reviewed: Basic Metabolic Panel: Recent Labs    02/25/23 0800 08/19/23 0832  NA 135 135  K 4.4 4.3  CL 97* 98  CO2 30 32  GLUCOSE 88 84  BUN 17 15  CREATININE 0.69 0.61  CALCIUM  9.0 9.0  TSH 2.82 2.45   Liver Function Tests: Recent Labs    02/25/23 0800 08/19/23 0832  AST 33 33  ALT 21 21  BILITOT 0.6 0.7  PROT 7.0 7.1   No results for input(s): "LIPASE", "AMYLASE" in the last 8760 hours. No results for input(s): "AMMONIA" in the last 8760 hours. CBC: Recent Labs    02/25/23 0800 08/19/23 0832  WBC 4.3 4.9  NEUTROABS 2,012 2,842  HGB 12.7 12.2  HCT 39.6 37.6  MCV 91.7 91.9  PLT 110* CANCELED   Lipid Panel: Recent Labs    02/25/23 0800 08/19/23 0832  CHOL 148 148  HDL 59 65  LDLCALC 75 68  TRIG 48 72  CHOLHDL 2.5 2.3   No results found for: "HGBA1C"  Procedures since last visit: No results found.  Assessment/Plan Assessment and Plan    Obstructive Sleep Apnea Mild obstructive sleep apnea effectively managed with CPAP, reducing apnea episodes significantly. Headaches persist despite CPAP. - Continue CPAP therapy. - Follow up with nurse practitioner on May 15 for CPAP evaluation.  Chronic Headache Chronic headaches with mixed etiology per Dr Rexanne Catalina and Dr Omar Bibber, limited relief from tramadol ,  ineffective occipital nerve block. - Consider methocarbamol  for muscle relaxation if pain is  severe after exercise. She is going to see if she wants to see Neurosurgery for her neck pain  Cervical Spinal Stenosis Chronic neck pain and headaches possibly related to cervical spinal stenosis. Ineffective occipital nerve block. Neurosurgery evaluation of chronic neck pain and headaches.  Arthritis Chronic arthritis with shoulder and arm pain, exacerbated by exercise. Advised to exercise cautiously to avoid overexertion.Uses Tramadol  PRN - Continue current exercise regimen with caution to avoid overexertion.  Edema due to Amlodipine  Edema in right leg and ankle likely due to amlodipine .   Swelling persists despite compression stockings. Decision to discontinue amlodipine  and adjust diuretic therapy. - Discontinue amlodipine . - Increase hydrochlorothiazide  to 25 mg daily. - Monitor blood pressure. - Add potassium supplement 10 mg daily. - Schedule blood work in two weeks to monitor kidney function and potassium levels.  Venous Insufficiency Venous insufficiency with previous Doppler studies showing no significant findings. Persistent right leg swelling possibly exacerbated by amlodipine .         Labs/tests ordered:  * No order type specified * Next appt:  Visit date not found

## 2023-09-03 ENCOUNTER — Other Ambulatory Visit: Payer: Self-pay | Admitting: Internal Medicine

## 2023-09-06 ENCOUNTER — Other Ambulatory Visit: Payer: Self-pay | Admitting: Internal Medicine

## 2023-09-06 DIAGNOSIS — M7989 Other specified soft tissue disorders: Secondary | ICD-10-CM | POA: Diagnosis not present

## 2023-09-06 LAB — BASIC METABOLIC PANEL WITHOUT GFR
BUN: 20 mg/dL (ref 7–25)
CO2: 33 mmol/L — ABNORMAL HIGH (ref 20–32)
Calcium: 9.2 mg/dL (ref 8.6–10.4)
Chloride: 95 mmol/L — ABNORMAL LOW (ref 98–110)
Creat: 0.64 mg/dL (ref 0.60–0.95)
Glucose, Bld: 74 mg/dL (ref 65–99)
Potassium: 4.2 mmol/L (ref 3.5–5.3)
Sodium: 133 mmol/L — ABNORMAL LOW (ref 135–146)

## 2023-09-06 LAB — CBC WITH DIFFERENTIAL/PLATELET
Absolute Lymphocytes: 1466 {cells}/uL (ref 850–3900)
Absolute Monocytes: 340 {cells}/uL (ref 200–950)
Basophils Absolute: 8 {cells}/uL (ref 0–200)
Basophils Relative: 0.2 %
Eosinophils Absolute: 109 {cells}/uL (ref 15–500)
Eosinophils Relative: 2.6 %
HCT: 35.7 % (ref 35.0–45.0)
Hemoglobin: 11.8 g/dL (ref 11.7–15.5)
MCH: 29.6 pg (ref 27.0–33.0)
MCHC: 33.1 g/dL (ref 32.0–36.0)
MCV: 89.7 fL (ref 80.0–100.0)
MPV: 10.1 fL (ref 7.5–12.5)
Monocytes Relative: 8.1 %
Neutro Abs: 2276 {cells}/uL (ref 1500–7800)
Neutrophils Relative %: 54.2 %
Platelets: 105 10*3/uL — ABNORMAL LOW (ref 140–400)
RBC: 3.98 10*6/uL (ref 3.80–5.10)
RDW: 11.8 % (ref 11.0–15.0)
Total Lymphocyte: 34.9 %
WBC: 4.2 10*3/uL (ref 3.8–10.8)

## 2023-09-06 NOTE — Telephone Encounter (Signed)
 Last Fill: 03/03/23  Last OV: 08/25/23 Next OV: 09/15/23  Routing to provider for review/authorization.

## 2023-09-06 NOTE — Telephone Encounter (Signed)
 Patient is requesting a refill of the following medications: Requested Prescriptions   Pending Prescriptions Disp Refills   traMADol  (ULTRAM ) 50 MG tablet [Pharmacy Med Name: traMADol  HCl 50 MG Oral Tablet] 10 tablet 0    Sig: Take 1 tablet by mouth twice daily    Date of last refill: 03/03/2023  Refill amount: 60/3  Treatment agreement date:  Patient does has an upcoming appointment and will sign for her contract.

## 2023-09-06 NOTE — Telephone Encounter (Signed)
 Copied from CRM [id number]. Topic: Clinical - Medication Refill >> Sep 06, 2023  2:07 PM Blair Bumpers wrote: Most Recent Primary Care Visit:  Provider: Marguerite Shiley  Department: [facility]  Visit Type: [facility]  Date: 08/25/2023  Medication: traMADol  (ULTRAM ) 50 MG tablet  Has the patient contacted their pharmacy? Yes, pharmacy stated they couldn't fill it. Need a new rx.  (Agent: If no, request that the patient contact the pharmacy for the refill. If patient does not wish to contact the pharmacy document the reason why and proceed with request.) (Agent: If yes, when and what did the pharmacy advise?)  Is this the correct pharmacy for this prescription? Yes If no, delete pharmacy and type the correct one.  This is the patient's preferred pharmacy:  [facility] [address], [place] - [address] [place] [address] Phone: [phone] Fax: [phone]   Has the prescription been filled recently? Yes  Is the patient out of the medication? Yes  Has the patient been seen for an appointment in the last year OR does the patient have an upcoming appointment? Yes  Can we respond through MyChart? Yes  Agent: Please be advised that Rx refills may take up to 3 business days. We ask that you follow-up with your pharmacy.

## 2023-09-06 NOTE — Telephone Encounter (Signed)
 Patient is requesting a refill of the following medications: Requested Prescriptions   Pending Prescriptions Disp Refills   traMADol  (ULTRAM ) 50 MG tablet 60 tablet 3    Sig: Take 1 tablet (50 mg total) by mouth 2 (two) times daily.    Date of last approval: 03/03/2023  Refill amount: 60/3 additional refills   Treatment agreement date: No treatment agreement on file, notation made on pending appointment for 09/15/23

## 2023-09-08 ENCOUNTER — Encounter (HOSPITAL_COMMUNITY): Payer: Self-pay

## 2023-09-08 ENCOUNTER — Encounter: Admitting: Internal Medicine

## 2023-09-08 MED ORDER — TRAMADOL HCL 50 MG PO TABS: 50.0000 mg | ORAL_TABLET | Freq: Two times a day (BID) | ORAL | 3 refills | Status: DC

## 2023-09-08 NOTE — Telephone Encounter (Signed)
 Pt calling back regarding the status of her medication: TraMADol  (ULTRAM ) 50 MG tablet   Please advise

## 2023-09-15 ENCOUNTER — Encounter: Payer: Self-pay | Admitting: Internal Medicine

## 2023-09-15 ENCOUNTER — Ambulatory Visit: Admitting: Internal Medicine

## 2023-09-15 VITALS — BP 124/84 | HR 68 | Temp 97.0°F | Resp 16 | Ht 61.0 in | Wt 129.9 lb

## 2023-09-15 DIAGNOSIS — I10 Essential (primary) hypertension: Secondary | ICD-10-CM

## 2023-09-15 DIAGNOSIS — E538 Deficiency of other specified B group vitamins: Secondary | ICD-10-CM

## 2023-09-15 DIAGNOSIS — M7989 Other specified soft tissue disorders: Secondary | ICD-10-CM

## 2023-09-15 DIAGNOSIS — G4452 New daily persistent headache (NDPH): Secondary | ICD-10-CM | POA: Diagnosis not present

## 2023-09-15 DIAGNOSIS — M542 Cervicalgia: Secondary | ICD-10-CM

## 2023-09-15 MED ORDER — HYDROCHLOROTHIAZIDE 12.5 MG PO TABS: 25.0000 mg | ORAL_TABLET | Freq: Every day | ORAL | 3 refills | Status: DC

## 2023-09-15 MED ORDER — HYDROCHLOROTHIAZIDE 25 MG PO TABS: 25.0000 mg | ORAL_TABLET | Freq: Every day | ORAL | 3 refills | Status: AC

## 2023-09-15 NOTE — Progress Notes (Signed)
 Location:  [facility] of Service:   Clinic  Provider:   Code Status:  Goals of Care:     09/15/2023    1:05 PM  Advanced Directives  Does Patient Have a Medical Advance Directive? Yes  Type of Estate agent of [place];Living will  Does patient want to make changes to medical advance directive? No - Patient declined  Copy of Healthcare Power of Attorney in Chart? No - copy requested     Chief Complaint  Patient presents with   Leg Swelling    2 week follow up on right leg swelling.    HPI: Patient is a 89 y.o. female seen today for an acute visit for Follow up of her BP and Blood Work  [place] in [place]  Acute issues Hypertension Norvasc  changed ot higher dose of hydrochlorothiazide  Swelling better BP controlled  Chronic Headaches  Controlled with Tramadol  PRN Has failed many other therapies Possible related to her Spinal Stenosis in her Neck  Mild thrombocytopenia Mild Hyponatremia Mild sleep apnea  New Diagnosis managed with CPAP  Other history Patient also has a history of , arthritis, hypothyroidism, atrophic vaginitis, hyperlipidemia and hyponatremia Patient also has a history of gastric ulcer, gastritis and collagenous colitis. Had tapered herself off Protonix  and budesonide  S/p Lumbar Laminectomy in 12/2020   Past Medical History:  Diagnosis Date   Anemia 11/2015   Arthritis    Blood transfusion 2017 JULY 22   Carotid stenosis    Dopplers 3/14: RICA 40-59%, LICA 0-39%   Cataract    REMOVED   Chronic headaches    HX MIGRAINES   Collagenous colitis    Diverticulosis 2013   Colonoscopy   Gastric ulcer 11/2015   Glaucoma    BOTH EYES   Hiatal hernia    History of spinal stenosis    History of uterine fibroid    Hx of cardiac catheterization    LHC 3/14: no angiographic CAD, EF 65%   Hx of echocardiogram    Echocardiogram 06/21/12: Mild LVH, EF 60-65%, grade 2 diastolic dysfunction, mild MR    Hyperlipidemia    Hypertension    Hypothyroidism    Internal hemorrhoids 2009   Colonoscopy   Migraines    UTI (urinary tract infection) HX OF    Past Surgical History:  Procedure Laterality Date   BREAST BIOPSY Right    BUNIONECTOMY  YRS AGO LEFT   RIGHT CONE AT [facility] 3 YRS AGO   BUNIONECTOMY Left    CATARACT EXTRACTION W/ INTRAOCULAR LENS IMPLANT  03/2010   Left   CATARACT EXTRACTION W/ INTRAOCULAR LENS IMPLANT Bilateral 11/2010   COLONOSCOPY  2013   NORMAL    ESOPHAGOGASTRODUODENOSCOPY N/A 11/23/2015   Procedure: ESOPHAGOGASTRODUODENOSCOPY (EGD);  Surgeon: Ruby Corporal, MD;  [facility];  Service: Endoscopy;  Laterality: N/A;   EUS N/A 12/19/2015   Procedure: ESOPHAGEAL ENDOSCOPIC ULTRASOUND (EUS) RADIAL;  Surgeon: Janel Medford, MD;  [facility];  Service: Endoscopy;  Laterality: N/A;   FUNCTIONAL ENDOSCOPIC SINUS SURGERY     HAMMER TOE SURGERY     HAND SURGERY Right 1998   INGUINAL HERNIA REPAIR Left 03/11/2016   Procedure: LEFT INGUINAL HERINA REPAIR WITH MESH;  Surgeon: Dareen Ebbing, MD;  [facility];  Service: General;  Laterality: Left;   INSERTION OF MESH Left 03/11/2016   Procedure: INSERTION OF MESH;  Surgeon: Dareen Ebbing, MD;  [facility];  Service: General;  Laterality: Left;   IR RADIOLOGIST EVAL & MGMT  07/01/2017   LAMINECTOMY  03/2008   LAPAROTOMY N/A 11/11/2017   Procedure: MINI LAPAROTOMY;  Surgeon: Alphonso Aschoff, MD;  [facility];  Service: Gynecology;  Laterality: N/A;   LUMBAR LAMINECTOMY/DECOMPRESSION MICRODISCECTOMY N/A 12/23/2020   Procedure: Laminectomy and Foraminotomy - Lumbar one-two;  Surgeon: Isadora Mar, MD;  Location: [facility] OR;  Service: Neurosurgery;  Laterality: N/A;   POLYPECTOMY     REVERSE SHOULDER ARTHROPLASTY Right 10/13/2018   Procedure: REVERSE SHOULDER ARTHROPLASTY;  Surgeon: Ellard Gunning, MD;  [facility];  Service: Orthopedics;  Laterality: Right;    ROBOTIC ASSISTED TOTAL  HYSTERECTOMY WITH BILATERAL SALPINGO OOPHERECTOMY Bilateral 11/11/2017   Procedure: XI ROBOTIC ASSISTED TOTAL HYSTERECTOMY WITH BILATERAL SALPINGO OOPHORECTOMY FOR UTERUS GREATER THAN 250 GRAMS;  Surgeon: Alphonso Aschoff, MD;  [facility];  Service: Gynecology;  Laterality: Bilateral;   TONSILLECTOMY     TOTAL HIP ARTHROPLASTY  2008   Right    No Known Allergies  Outpatient Encounter Medications as of 09/15/2023  Medication Sig   atorvastatin  (LIPITOR) 10 MG tablet TAKE 1 TABLET BY MOUTH EVERY DAY   Cholecalciferol (VITAMIN D3 SUPER STRENGTH) 50 MCG (2000 UT) TABS Take 2,000 Units by mouth daily.   clobetasol ointment (TEMOVATE) 0.05 % Apply 1 application topically daily as needed (vaginitis).   estradiol  (ESTRACE ) 0.5 MG tablet TAKE ONE TABLET BY MOUTH AT BEDTIME   hydrochlorothiazide  (HYDRODIURIL ) 12.5 MG tablet Take 25 mg by mouth daily.   latanoprost  (XALATAN ) 0.005 % ophthalmic solution Place 1 drop into both eyes at bedtime.   levothyroxine  (SYNTHROID ) 125 MCG tablet TAKE 1 TABLET BY MOUTH EVERY DAY   losartan  (COZAAR ) 100 MG tablet TAKE 1 TABLET BY MOUTH EVERY DAY   Multiple Vitamin (MULTIVITAMIN) tablet Take 1 tablet by mouth daily with lunch.   potassium chloride  (KLOR-CON  M) 10 MEQ tablet Take 1 tablet (10 mEq total) by mouth daily.   traMADol  (ULTRAM ) 50 MG tablet Take 1 tablet (50 mg total) by mouth 2 (two) times daily.   Turmeric (QC TUMERIC COMPLEX PO) Take 1,500 Units by mouth 2 (two) times daily.   vitamin C (ASCORBIC ACID ) 500 MG tablet Take 500 mg by mouth daily with lunch.   hydrochlorothiazide  (HYDRODIURIL ) 12.5 MG tablet TAKE 1 TABLET BY MOUTH EVERY DAY (Patient not taking: Reported on 09/15/2023)   No facility-administered encounter medications on file as of 09/15/2023.    Review of Systems:  Review of Systems  Constitutional:  Negative for activity change and appetite change.  HENT: Negative.    Respiratory:  Negative for cough and shortness of breath.    Cardiovascular:  Positive for leg swelling.  Gastrointestinal:  Negative for constipation.  Genitourinary: Negative.   Musculoskeletal:  Negative for arthralgias, gait problem and myalgias.  Skin: Negative.   Neurological:  Positive for headaches. Negative for dizziness and weakness.  Psychiatric/Behavioral:  Negative for confusion, dysphoric mood and sleep disturbance.     Health Maintenance  Topic Date Due   Zoster Vaccines- Shingrix (1 of 2) 08/22/1950   COVID-19 Vaccine (5 - 2024-25 season) 04/14/2023   Medicare Annual Wellness (AWV)  08/03/2023   INFLUENZA VACCINE  12/03/2023   DTaP/Tdap/Td (2 - Td or Tdap) 06/13/2030   Pneumonia Vaccine 104+ Years old  Completed   DEXA SCAN  Completed   HPV VACCINES  Aged Out   Meningococcal B Vaccine  Aged Out    Physical Exam: Vitals:   09/15/23 1302  BP: 124/84  Pulse: 68  Resp: 16  Temp: (!) 97 F (36.1 C)  SpO2: 96%  Weight: 129 lb 14.4 oz (58.9 kg)  Height: 5\' 1"  (1.549 m)   Body mass index is 24.54 kg/m. Physical Exam Vitals reviewed.  Constitutional:      Appearance: Normal appearance.  HENT:     Head: Normocephalic.     Nose: Nose normal.     Mouth/Throat:     Mouth: Mucous membranes are moist.     Pharynx: Oropharynx is clear.  Eyes:     Pupils: Pupils are equal, round, and reactive to light.  Cardiovascular:     Rate and Rhythm: Normal rate and regular rhythm.     Pulses: Normal pulses.     Heart sounds: Normal heart sounds. No murmur heard. Pulmonary:     Effort: Pulmonary effort is normal.     Breath sounds: Normal breath sounds.  Abdominal:     General: Abdomen is flat. Bowel sounds are normal.     Palpations: Abdomen is soft.  Musculoskeletal:     Cervical back: Neck supple.     Comments: Right leg swelling  Skin:    General: Skin is warm.  Neurological:     General: No focal deficit present.     Mental Status: She is alert and oriented to person, place, and time.  Psychiatric:        Mood and  Affect: Mood normal.        Thought Content: Thought content normal.     Labs reviewed: Basic Metabolic Panel: Recent Labs    02/25/23 0800 08/19/23 0832 09/06/23 0800  NA 135 135 133*  K 4.4 4.3 4.2  CL 97* 98 95*  CO2 30 32 33*  GLUCOSE 88 84 74  BUN 17 15 20   CREATININE 0.69 0.61 0.64  CALCIUM  9.0 9.0 9.2  TSH 2.82 2.45  --    Liver Function Tests: Recent Labs    02/25/23 0800 08/19/23 0832  AST 33 33  ALT 21 21  BILITOT 0.6 0.7  PROT 7.0 7.1   No results for input(s): "LIPASE", "AMYLASE" in the last 8760 hours. No results for input(s): "AMMONIA" in the last 8760 hours. CBC: Recent Labs    02/25/23 0800 08/19/23 0832 09/06/23 0800  WBC 4.3 4.9 4.2  NEUTROABS 2,012 2,842 2,276  HGB 12.7 12.2 11.8  HCT 39.6 37.6 35.7  MCV 91.7 91.9 89.7  PLT 110* CANCELED 105*   Lipid Panel: Recent Labs    02/25/23 0800 08/19/23 0832  CHOL 148 148  HDL 59 65  LDLCALC 75 68  TRIG 48 72  CHOLHDL 2.5 2.3   No results found for: "HGBA1C"  Procedures since last visit: No results found.  Assessment/Plan 1. Primary hypertension (Primary) Norvasc  stopped due to worsening leg swelling Now on hydrochlorothiazide  25 mg Doing well Sodium slightly down will check in 3 months  2. Right leg swelling Dopplers negative in the past Better since Norvasc  stopped  3. New daily persistent headache ? Etiology She was seen by Dr Rexanne Catalina for neck pain and also had seen ENT Nothing seems to have helped She takes Tramadol  as needed which seems to be working for her Has Failed Topamax , Robaxin  and tylenol  4. Neck pain Due to Stenosis Not sure if she wants to see Neurosurgery right now as not surgical candidate 5 Mild Sleep Apnea Trying CPAP to see if it will help Headache It has not helped so far 6 Mild Thrombocytopenia  Asymptomatic Will check B 12  Repeat in few months 7 Hyponatremia Repeat BMP Labs/tests ordered:   Next appt:  Visit date not found

## 2023-09-15 NOTE — Patient Instructions (Addendum)
 Please come to [facility] on Monday 12/13/2023 at 7:45am for labs.

## 2023-09-16 ENCOUNTER — Encounter: Payer: Self-pay | Admitting: Neurology

## 2023-09-16 ENCOUNTER — Ambulatory Visit: Payer: Medicare Other | Admitting: Neurology

## 2023-09-16 VITALS — BP 144/88 | HR 99 | Ht 61.0 in | Wt 129.5 lb

## 2023-09-16 DIAGNOSIS — G4733 Obstructive sleep apnea (adult) (pediatric): Secondary | ICD-10-CM | POA: Diagnosis not present

## 2023-09-16 DIAGNOSIS — R519 Headache, unspecified: Secondary | ICD-10-CM | POA: Insufficient documentation

## 2023-09-16 DIAGNOSIS — M542 Cervicalgia: Secondary | ICD-10-CM | POA: Diagnosis not present

## 2023-09-16 DIAGNOSIS — G8929 Other chronic pain: Secondary | ICD-10-CM | POA: Diagnosis not present

## 2023-09-16 NOTE — Patient Instructions (Signed)
 Recommend continue using CPAP for at least 2 more months to see full benefit. At that point, if you notice no benefit, let me know and we can discuss next steps. Follow up with your primary care about referral to neurosurgery

## 2023-09-16 NOTE — Progress Notes (Signed)
 Patient: Carol Wells Date of Birth: Jul 17, 1931  Reason for Visit: Follow up History from: Patient Primary Neurologist: Carol Wells  ASSESSMENT AND PLAN 89 y.o. year old female   1.  Mild OSA on CPAP, set up 07/08/23 2.  Long history of headache, migraine headache, cervicogenic headache  Has been using CPAP for a little over 2 months with excellent complaince.  At this point, has not experienced any subjective benefit with CPAP.  Her HST showed total AHI of 14.9/hour and O2 nadir of 85%.  I have advised her to continue CPAP use nightly for minimum 4 hours for at least another 2 months before we make up final decision about CPAP efficacy.  She is encouraged to continue discussing with her primary care about obtaining MRI cervical spine and possible referral to neurosurgery for cervical stenosis.  For now, we will schedule a follow-up in 4 months with Dr. Omar Wells.  She is not sure she wants any further medication management for her headaches.   HISTORY OF PRESENT ILLNESS: Today 09/16/23 Saw Dr. Omar Wells in January 2025 for headaches.  Had HST 06/15/2023 overall mild OSA with total AHI 14.9/hour and O2 nadir 85%.  Recommended proceed with CPAP.  CPAP report shows 97% compliance greater than 4 hours. 5-11 cm water . AHI 0.8, leak 12.6. Setup date 07/08/23.  Since starting CPAP, she has not noted any benefit.  Her headaches have remained the same.  Headaches are daily,if not present in the AM, will develop mid morning.  Takes tramadol .  She tried Lexapro  to help with anxiety and possibly headaches, had to stop after 1 week due to feeling shaky.  In the past was treated for migraines, utilized Imitrex.  She has had headaches for years.  She is a retired Charity fundraiser.  Her husband passed away a little over a year ago.  She lives in an independent apartment at [facility].  She drives a car.  Goes to exercise class daily.  Her headaches are usually located in the occipital area, radiates forward to the left side.  She has seen  Carol Wells and has had a cervical ESI without benefit.  She has recently discussed with her primary care considering a dedicated MRI cervical spine with consultation at [facility], specifically Dr. Crecencio Dodge to see if any pain management options may be available.  She is not interested in surgery.  HISTORY  05/13/23 Dr. Omar Wells: I saw your patient, Carol Wells, upon your kind request in my neurologic clinic today for evaluation of her headaches.  The patient is unaccompanied today.  As you know, Carol Wells is an 89 year old female with an underlying medical history of hyperlipidemia, hypertension, glaucoma, hypothyroidism, anemia, arthritis, carotid stenosis, cataracts, diverticulosis, spinal stenosis, cervical spondylosis, allergic rhinitis, history of epistaxis with status post cautery patient under Dr. Darlin Ehrlich in 2016, chronic sphenoidal sinusitis, migraine headaches, and vertigo who reports a longstanding history of recurrent migraines.  She has had recurrent headaches separate from migraines also for several years, migraines improved over time especially after her hysterectomy but for the past year she has had recurrence of her headache.  They have become nearly daily at this time, sometimes they are constant, typically not associated with nausea or vomiting or photophobia.  She has a history of chronic neck pain and recently saw Dr. Vaughn Georges at [facility].  She was then referred to Dr. Rexanne Catalina for interventional pain management and on 04/15/2023 received bilateral nerve blocks which did not really help.  She had a follow-up about  a week ago and there was not a whole lot that was offered at the time.  She had been on gabapentin  in the past but had side effects.  For her migraines she has tried multiple medications in the past and over the course of time including Maxalt, Amerge, and sumatriptan hand.  She does not feel that her current headaches are migraine-like like they used to.  She has headaches upon  awakening.  She has some history of snoring but has never had a sleep study.  She is widowed, her husband passed away about a year ago.  She lives at friend's home in a cottage.  She has 2 grown children, daughter in [place]  and son lives in [place] .  She has grandchildren that are also out of state including in [place]  and [place] .  She is a retired Charity fundraiser.  She has taken tramadol  for pain, originally started taking tramadol  after her hysterectomy.  As she lives alone, she is not keen on trying any medications that could be sedating or increase her fall risk.  She was offered a referral to pain management from Dr. Rexanne Catalina but declined. I reviewed your note from 12/31/2022.     She had a brain MRI with and without contrast on 02/01/2023 with indication of chronic sphenoidal sinusitis.  I reviewed the results:  IMPRESSION: 1.  No evidence of an acute intracranial abnormality. 2. Chronic small vessel ischemic changes which are advanced in the cerebral white matter, and mild in the pons. Findings are similar to the prior brain MRI of 03/30/2017. 3. Chronic lacunar infarcts again noted within the bilateral thalami. 4. Mild-to-moderate generalized cerebral atrophy. 5. Moderate left sphenoid sinusitis. 6. Incompletely assessed cervical spondylosis. At C3-C4 and C4-C5, posterior disc osteophyte complexes contribute to spinal canal stenosis which appears at least moderate, and may be severe (with spinal cord flattening). Consider a dedicated cervical spine MRI for further evaluation.   In addition, I personally and independently reviewed images through the PACS system.       She had a CT of the facial bones without contrast through [facility] on 03/01/2023 and I reviewed the results: Impression: 1. Unchanged partial opacification of the left sphenoid sinus.  2. Similar multifocal areas of bony dehiscence or thinning along the  posterior wall the left sphenoid sinus.    I reviewed her primary care  note from 05/12/2023, she is followed by Dr. Venice Gillis.  She was given a prescription for Lexapro  low-dose, 5 mg strength but has not picked it up yet, was reluctant to start anything new.  Often she wakes up first thing in the morning with a headache.  Her son has sleep apnea and uses a CPAP machine.  In the past, she had taken Topamax  briefly for about a week.  Of note, she does have a history of glaucoma.    She has a history of gastric ulcer and gastritis as well as colitis, she has been on budesonide .  She had lumbar spine surgery in the past.  In addition, she had multiple other surgeries including right shoulder reverse arthroplasty in 2020, hysterectomy and bilateral salpingo-oophorectomy in 2019, tonsillectomy, right total hip arthroplasty, cataract surgeries, sinus surgery, inguinal hernia repair. She denies any recent falls.  She tries to hydrate well with water .  She limits her caffeine to 1 cup of coffee in the morning, she is a non-smoker and does not drink any alcohol.  She is up-to-date with her eye examinations and usually goes twice a year.  REVIEW OF SYSTEMS: Out of a complete 14 system review of symptoms, the patient complains only of the following symptoms, and all other reviewed systems are negative.  See HPI  ALLERGIES: No Known Allergies  HOME MEDICATIONS: Outpatient Medications Prior to Visit  Medication Sig Dispense Refill   atorvastatin  (LIPITOR) 10 MG tablet TAKE 1 TABLET BY MOUTH EVERY DAY 90 tablet 1   Cholecalciferol (VITAMIN D3 SUPER STRENGTH) 50 MCG (2000 UT) TABS Take 2,000 Units by mouth daily.     clobetasol ointment (TEMOVATE) 0.05 % Apply 1 application topically daily as needed (vaginitis).     estradiol  (ESTRACE ) 0.5 MG tablet TAKE ONE TABLET BY MOUTH AT BEDTIME 90 tablet 1   hydrochlorothiazide  (HYDRODIURIL ) 25 MG tablet Take 1 tablet (25 mg total) by mouth daily. 90 tablet 3   latanoprost  (XALATAN ) 0.005 % ophthalmic solution Place 1 drop into both eyes at  bedtime.     levothyroxine  (SYNTHROID ) 125 MCG tablet TAKE 1 TABLET BY MOUTH EVERY DAY 90 tablet 1   losartan  (COZAAR ) 100 MG tablet TAKE 1 TABLET BY MOUTH EVERY DAY 90 tablet 3   Multiple Vitamin (MULTIVITAMIN) tablet Take 1 tablet by mouth daily with lunch.     traMADol  (ULTRAM ) 50 MG tablet Take 1 tablet (50 mg total) by mouth 2 (two) times daily. 60 tablet 3   Turmeric (QC TUMERIC COMPLEX PO) Take 1,500 Units by mouth 2 (two) times daily.     vitamin C (ASCORBIC ACID ) 500 MG tablet Take 500 mg by mouth daily with lunch.     No facility-administered medications prior to visit.    PAST MEDICAL HISTORY: Past Medical History:  Diagnosis Date   Anemia 11/2015   Arthritis    Blood transfusion 2017 JULY 22   Carotid stenosis    Dopplers 3/14: RICA 40-59%, LICA 0-39%   Cataract    REMOVED   Chronic headaches    HX MIGRAINES   Collagenous colitis    Diverticulosis 2013   Colonoscopy   Gastric ulcer 11/2015   Glaucoma    BOTH EYES   Hiatal hernia    History of spinal stenosis    History of uterine fibroid    Hx of cardiac catheterization    LHC 3/14: no angiographic CAD, EF 65%   Hx of echocardiogram    Echocardiogram 06/21/12: Mild LVH, EF 60-65%, grade 2 diastolic dysfunction, mild MR   Hyperlipidemia    Hypertension    Hypothyroidism    Internal hemorrhoids 2009   Colonoscopy   Migraines    UTI (urinary tract infection) HX OF    PAST SURGICAL HISTORY: Past Surgical History:  Procedure Laterality Date   BREAST BIOPSY Right    BUNIONECTOMY  YRS AGO LEFT   RIGHT [facility] AT [facility] 3 YRS AGO   BUNIONECTOMY Left    CATARACT EXTRACTION W/ INTRAOCULAR LENS IMPLANT  03/2010   Left   CATARACT EXTRACTION W/ INTRAOCULAR LENS IMPLANT Bilateral 11/2010   COLONOSCOPY  2013   NORMAL    ESOPHAGOGASTRODUODENOSCOPY N/A 11/23/2015   Procedure: ESOPHAGOGASTRODUODENOSCOPY (EGD);  Surgeon: Ruby Corporal, MD;  [facility];  Service: Endoscopy;  Laterality: N/A;   EUS N/A  12/19/2015   Procedure: ESOPHAGEAL ENDOSCOPIC ULTRASOUND (EUS) RADIAL;  Surgeon: Janel Medford, MD;  Location: [facility];  Service: Endoscopy;  Laterality: N/A;   FUNCTIONAL ENDOSCOPIC SINUS SURGERY     HAMMER TOE SURGERY     HAND SURGERY Right 1998   INGUINAL HERNIA REPAIR Left 03/11/2016  Procedure: LEFT INGUINAL HERINA REPAIR WITH MESH;  Surgeon: Dareen Ebbing, MD;  Location: [facility] OR;  Service: General;  Laterality: Left;   INSERTION OF MESH Left 03/11/2016   Procedure: INSERTION OF MESH;  Surgeon: Dareen Ebbing, MD;  Location: [facility] OR;  Service: General;  Laterality: Left;   IR RADIOLOGIST EVAL & MGMT  07/01/2017   LAMINECTOMY  03/2008   LAPAROTOMY N/A 11/11/2017   Procedure: MINI LAPAROTOMY;  Surgeon: Alphonso Aschoff, MD;  Location: [facility];  Service: Gynecology;  Laterality: N/A;   LUMBAR LAMINECTOMY/DECOMPRESSION MICRODISCECTOMY N/A 12/23/2020   Procedure: Laminectomy and Foraminotomy - Lumbar one-two;  Surgeon: Isadora Mar, MD;  Location: [facility] OR;  Service: Neurosurgery;  Laterality: N/A;   POLYPECTOMY     REVERSE SHOULDER ARTHROPLASTY Right 10/13/2018   Procedure: REVERSE SHOULDER ARTHROPLASTY;  Surgeon: Ellard Gunning, MD;  Location: [facility];  Service: Orthopedics;  Laterality: Right;    ROBOTIC ASSISTED TOTAL HYSTERECTOMY WITH BILATERAL SALPINGO OOPHERECTOMY Bilateral 11/11/2017   Procedure: XI ROBOTIC ASSISTED TOTAL HYSTERECTOMY WITH BILATERAL SALPINGO OOPHORECTOMY FOR UTERUS GREATER THAN 250 GRAMS;  Surgeon: Alphonso Aschoff, MD;  Location: [facility];  Service: Gynecology;  Laterality: Bilateral;   TONSILLECTOMY     TOTAL HIP ARTHROPLASTY  2008   Right    FAMILY HISTORY: Family History  Problem Relation Age of Onset   Anuerysm Father 46   Colon cancer Brother 49   Migraines Grandchild     SOCIAL HISTORY: Social History   Socioeconomic History   Marital status: Married    Spouse name: Not on file   Number of children: 2   Years of education: Not on file   Highest  education level: Not on file  Occupational History   Occupation: Retired Charity fundraiser  Tobacco Use   Smoking status: Never   Smokeless tobacco: Never  Vaping Use   Vaping status: Never Used  Substance and Sexual Activity   Alcohol use: Not Currently   Drug use: Never   Sexual activity: Not Currently  Other Topics Concern   Not on file  Social History Narrative   Diet:  Blank      Do you drink/ eat things with caffeine?  Coffee      Marital status:  Married                             What year were you married ? 1957      Do you live in a house, apartment,assistred living, condo, trailer, etc.)? Apartment-Town House      Is it one or more stories?  One      How many persons live in your home ?  2      Do you have any pets in your home ?(please list) No      Highest Level of education completed: Bachelors Degree      Current or past profession:  RN      Do you exercise?   Yes                           Type & how often  Walking/ Stretching/Daily      ADVANCED DIRECTIVES (Please bring copies)      Do you have a living will? No      Do you have a DNR form?  No  If not, do you want to discuss one?       Do you have signed POA?HPOA forms?    No             If so, please bring to your appointment      FUNCTIONAL STATUS- To be completed by Spouse / child / Staff       Do you have difficulty bathing or dressing yourself ? No      Do you have difficulty preparing food or eating ? No      Do you have difficulty managing your mediation ? No      Do you have difficulty managing your finances ? No      Do you have difficulty affording your medication ? No      Social Drivers of Corporate investment banker Strain: Low Risk  (08/03/2022)   Overall Financial Resource Strain (CARDIA)    Difficulty of Paying Living Expenses: Not hard at all  Food Insecurity: Low Risk  (12/31/2022)   Received from [facility]   Hunger Vital Sign    Worried About Running Out of Food  in the Last Year: Never true    Ran Out of Food in the Last Year: Never true  Transportation Needs: No Transportation Needs (12/31/2022)   Received from [facility]    In the past 12 months, has lack of reliable transportation kept you from medical appointments, meetings, work or from getting things needed for daily living? : No  Physical Activity: Sufficiently Active (08/03/2022)   Exercise Vital Sign    Days of Exercise per Week: 7 days    Minutes of Exercise per Session: 40 min  Stress: No Stress Concern Present (08/03/2022)   [employer] of Occupational Health - Occupational Stress Questionnaire    Feeling of Stress : Only a little  Social Connections: Socially Isolated (08/03/2022)   Social Connection and Isolation Panel [NHANES]    Frequency of Communication with Friends and Family: More than three times a week    Frequency of Social Gatherings with Friends and Family: Once a week    Attends Religious Services: Never    Database administrator or Organizations: No    Attends Banker Meetings: Never    Marital Status: Widowed  Intimate Partner Violence: Not At Risk (08/03/2022)   Humiliation, Afraid, Rape, and Kick questionnaire    Fear of Current or Ex-Partner: No    Emotionally Abused: No    Physically Abused: No    Sexually Abused: No    PHYSICAL EXAM  Vitals:   09/16/23 1243 09/16/23 1246  BP: (!) 150/77 (!) 144/88  Pulse:  99  Weight:  129 lb 8 oz (58.7 kg)  Height:  5\' 1"  (1.549 m)   Body mass index is 24.47 kg/m.  Generalized: Well developed, in no acute distress  Neurological examination  Mentation: Alert oriented to time, place, history taking. Follows all commands speech and language fluent Cranial nerve II-XII: Pupils were equal round reactive to light. Extraocular movements were full, visual field were full on confrontational test. Facial sensation and strength were normal.Head turning and shoulder shrug  were normal and  symmetric. Motor: The motor testing reveals 5 over 5 strength of all 4 extremities. Good symmetric motor tone is noted throughout.  Sensory: Sensory testing is intact to soft touch on all 4 extremities. No evidence of extinction is noted.  Coordination: Cerebellar testing reveals good finger-nose-finger and heel-to-shin bilaterally.  Gait and station: Gait is normal.  .  DIAGNOSTIC DATA (LABS, IMAGING, TESTING) - I reviewed patient records, labs, notes, testing and imaging myself where available.  Lab Results  Component Value Date   WBC 4.2 09/06/2023   HGB 11.8 09/06/2023   HCT 35.7 09/06/2023   MCV 89.7 09/06/2023   PLT 105 (L) 09/06/2023      Component Value Date/Time   NA 133 (L) 09/06/2023 0800   K 4.2 09/06/2023 0800   CL 95 (L) 09/06/2023 0800   CO2 33 (H) 09/06/2023 0800   GLUCOSE 74 09/06/2023 0800   BUN 20 09/06/2023 0800   CREATININE 0.64 09/06/2023 0800   CALCIUM  9.2 09/06/2023 0800   PROT 7.1 08/19/2023 0832   ALBUMIN 4.1 06/19/2020 1348   AST 33 08/19/2023 0832   ALT 21 08/19/2023 0832   ALKPHOS 54 06/19/2020 1348   BILITOT 0.7 08/19/2023 0832   GFRNONAA >60 12/12/2020 1104   GFRNONAA 82 02/27/2020 0740   GFRAA 95 02/27/2020 0740   Lab Results  Component Value Date   CHOL 148 08/19/2023   HDL 65 08/19/2023   LDLCALC 68 08/19/2023   TRIG 72 08/19/2023   CHOLHDL 2.3 08/19/2023   No results found for: "HGBA1C" Lab Results  Component Value Date   VITAMINB12 582 06/17/2017   Lab Results  Component Value Date   TSH 2.45 08/19/2023    Jeanmarie Millet, AGNP-C, DNP 09/16/2023, 1:31 PM [facility] [address] [place], [place] [address] [phone]

## 2023-09-20 NOTE — Progress Notes (Signed)
 I reviewed the above note and documentation by the Nurse Practitioner and agree with the history, exam, assessment and plan as outlined above. I was available for consultation. Debbra Fairy, MD, PhD [facility])

## 2023-10-06 DIAGNOSIS — H401431 Capsular glaucoma with pseudoexfoliation of lens, bilateral, mild stage: Secondary | ICD-10-CM | POA: Diagnosis not present

## 2023-10-11 ENCOUNTER — Telehealth: Payer: Self-pay

## 2023-10-11 DIAGNOSIS — J323 Chronic sphenoidal sinusitis: Secondary | ICD-10-CM

## 2023-10-11 NOTE — Telephone Encounter (Signed)
 Copied from CRM [id number]. Topic: Referral - Request for Referral >> Oct 11, 2023 10:17 AM Shelby Dessert H wrote: Did the patient discuss referral with their provider in the last year? Yes (If No - schedule appointment) (If Yes - send message)  Appointment offered? No  Type of order/referral and detailed reason for visit: ENT  Preference of office, provider, location: Dr. Darlin Ehrlich [address], [place], [place] [address] Phone: [phone] or [phone]  If referral order, have you been seen by this specialty before? Yes (If Yes, this issue or another issue? When? Where?  Can we respond through MyChart? Yes

## 2023-12-03 DIAGNOSIS — N8189 Other female genital prolapse: Secondary | ICD-10-CM | POA: Diagnosis not present

## 2023-12-03 DIAGNOSIS — R351 Nocturia: Secondary | ICD-10-CM | POA: Diagnosis not present

## 2023-12-03 DIAGNOSIS — N3946 Mixed incontinence: Secondary | ICD-10-CM | POA: Diagnosis not present

## 2023-12-05 ENCOUNTER — Other Ambulatory Visit: Payer: Self-pay | Admitting: Internal Medicine

## 2023-12-05 DIAGNOSIS — E785 Hyperlipidemia, unspecified: Secondary | ICD-10-CM

## 2023-12-13 ENCOUNTER — Other Ambulatory Visit: Payer: Self-pay | Admitting: Internal Medicine

## 2023-12-13 DIAGNOSIS — E538 Deficiency of other specified B group vitamins: Secondary | ICD-10-CM | POA: Diagnosis not present

## 2023-12-13 DIAGNOSIS — I10 Essential (primary) hypertension: Secondary | ICD-10-CM | POA: Diagnosis not present

## 2023-12-13 LAB — CBC WITH DIFFERENTIAL/PLATELET
Absolute Lymphocytes: 1400 {cells}/uL (ref 850–3900)
Absolute Monocytes: 300 {cells}/uL (ref 200–950)
Basophils Absolute: 12 {cells}/uL (ref 0–200)
Basophils Relative: 0.3 %
Eosinophils Absolute: 140 {cells}/uL (ref 15–500)
Eosinophils Relative: 3.5 %
HCT: 38.6 % (ref 35.0–45.0)
Hemoglobin: 12.6 g/dL (ref 11.7–15.5)
MCH: 29.3 pg (ref 27.0–33.0)
MCHC: 32.6 g/dL (ref 32.0–36.0)
MCV: 89.8 fL (ref 80.0–100.0)
MPV: 10.9 fL (ref 7.5–12.5)
Monocytes Relative: 7.5 %
Neutro Abs: 2148 {cells}/uL (ref 1500–7800)
Neutrophils Relative %: 53.7 %
Platelets: 94 Thousand/uL — ABNORMAL LOW (ref 140–400)
RBC: 4.3 Million/uL (ref 3.80–5.10)
RDW: 12.4 % (ref 11.0–15.0)
Total Lymphocyte: 35 %
WBC: 4 Thousand/uL (ref 3.8–10.8)

## 2023-12-13 LAB — COMPLETE METABOLIC PANEL WITHOUT GFR
AG Ratio: 1.4 (calc) (ref 1.0–2.5)
ALT: 21 U/L (ref 6–29)
AST: 32 U/L (ref 10–35)
Albumin: 4.3 g/dL (ref 3.6–5.1)
Alkaline phosphatase (APISO): 48 U/L (ref 37–153)
BUN: 16 mg/dL (ref 7–25)
CO2: 32 mmol/L (ref 20–32)
Calcium: 9.4 mg/dL (ref 8.6–10.4)
Chloride: 95 mmol/L — ABNORMAL LOW (ref 98–110)
Creat: 0.66 mg/dL (ref 0.60–0.95)
Globulin: 3 g/dL (ref 1.9–3.7)
Glucose, Bld: 81 mg/dL (ref 65–99)
Potassium: 4.1 mmol/L (ref 3.5–5.3)
Sodium: 134 mmol/L — ABNORMAL LOW (ref 135–146)
Total Bilirubin: 0.8 mg/dL (ref 0.2–1.2)
Total Protein: 7.3 g/dL (ref 6.1–8.1)

## 2023-12-13 LAB — VITAMIN B12: Vitamin B-12: 806 pg/mL (ref 200–1100)

## 2023-12-15 ENCOUNTER — Encounter: Payer: Self-pay | Admitting: Internal Medicine

## 2023-12-15 ENCOUNTER — Ambulatory Visit: Admitting: Internal Medicine

## 2023-12-15 VITALS — BP 138/84 | HR 81 | Temp 97.5°F | Ht 61.0 in | Wt 126.6 lb

## 2023-12-15 DIAGNOSIS — E039 Hypothyroidism, unspecified: Secondary | ICD-10-CM

## 2023-12-15 DIAGNOSIS — E785 Hyperlipidemia, unspecified: Secondary | ICD-10-CM | POA: Diagnosis not present

## 2023-12-15 DIAGNOSIS — G4452 New daily persistent headache (NDPH): Secondary | ICD-10-CM

## 2023-12-15 DIAGNOSIS — M542 Cervicalgia: Secondary | ICD-10-CM | POA: Diagnosis not present

## 2023-12-15 DIAGNOSIS — I10 Essential (primary) hypertension: Secondary | ICD-10-CM | POA: Diagnosis not present

## 2023-12-15 NOTE — Patient Instructions (Signed)
 Please have your labs done here at [facility] on 06/12/24 @ 7:45-8:00 prior to your visit with Dr. Charlanne.

## 2023-12-22 ENCOUNTER — Ambulatory Visit (INDEPENDENT_AMBULATORY_CARE_PROVIDER_SITE_OTHER): Admitting: Otolaryngology

## 2023-12-22 ENCOUNTER — Encounter (INDEPENDENT_AMBULATORY_CARE_PROVIDER_SITE_OTHER): Payer: Self-pay | Admitting: Otolaryngology

## 2023-12-22 VITALS — BP 142/78 | HR 76

## 2023-12-22 DIAGNOSIS — R519 Headache, unspecified: Secondary | ICD-10-CM | POA: Diagnosis not present

## 2023-12-22 DIAGNOSIS — J323 Chronic sphenoidal sinusitis: Secondary | ICD-10-CM | POA: Diagnosis not present

## 2023-12-22 NOTE — Progress Notes (Unsigned)
 Location:   [facility] of Service:   Clinic  Provider:   Code Status: DNR Goals of Care:     09/15/2023    1:05 PM  Advanced Directives  Does Patient Have a Medical Advance Directive? Yes  Type of Estate agent of [place];Living will  Does patient want to make changes to medical advance directive? No - Patient declined  Copy of Healthcare Power of Attorney in Chart? No - copy requested     Chief Complaint  Patient presents with   Medical Management of Chronic Issues    3 month follow up.      HPI: Patient is a 89 y.o. female seen today for medical management of chronic diseases.   [place] in [facility]    Hypertension Norvasc  changed ot higher dose of hydrochlorothiazide  Swelling better BP controlled   Chronic Headaches  Controlled with Tramadol  PRN Has failed many other therapies Possible related to her Spinal Stenosis in her Neck Plan to See Dr Comer ENT to make sure it is not related to Sinusitis   Mild thrombocytopenia Mild Hyponatremia Mild sleep apnea  New Diagnosis managed with CPAP Discussed the use of AI scribe software for clinical note transcription with the patient, who gave verbal consent to proceed.  History of Present Illness   Carol Wells is a 89 year old female with chronic headaches and spinal stenosis who presents for follow-up on her headaches and sinus pressure.  Carol Wells experiences persistent headaches with no change in severity or frequency. Tramadol , taken twice daily, provides some relief. Headaches occur in the morning and afternoon, improving by evening. Ice, heat, and relaxation techniques have been ineffective. Previous medications like Topamax  and gabapentin  were not tolerated or effective.  Significant sinus pressure is present, particularly on the left side affecting the sphenoid sinus.   Spinal stenosis affects her ability to walk long distances as her legs 'give out'. Carol Wells manages this condition with  regular exercise, noting that longer distances are particularly challenging.  Current medications include losartan  and hydrochlorothiazide  for blood pressure management. Carol Wells also takes turmeric with black pepper twice daily for arthritis, which Carol Wells finds beneficial for her hands. No nausea or bleeding issues with tramadol  use.        Other history Patient also has a history of , arthritis, hypothyroidism, atrophic vaginitis, hyperlipidemia  Patient also has a history of gastric ulcer, gastritis and collagenous colitis. Had tapered herself off Protonix  and budesonide  S/p Lumbar Laminectomy in 12/2020     Past Medical History:  Diagnosis Date   Anemia 11/2015   Arthritis    Blood transfusion 2017 JULY 22   Carotid stenosis    Dopplers 3/14: RICA 40-59%, LICA 0-39%   Cataract    REMOVED   Chronic headaches    HX MIGRAINES   Collagenous colitis    Diverticulosis 2013   Colonoscopy   Gastric ulcer 11/2015   Glaucoma    BOTH EYES   Hiatal hernia    History of spinal stenosis    History of uterine fibroid    Hx of cardiac catheterization    LHC 3/14: no angiographic CAD, EF 65%   Hx of echocardiogram    Echocardiogram 06/21/12: Mild LVH, EF 60-65%, grade 2 diastolic dysfunction, mild MR   Hyperlipidemia    Hypertension    Hypothyroidism    Internal hemorrhoids 2009   Colonoscopy   Migraines    UTI (urinary tract infection) HX OF  Past Surgical History:  Procedure Laterality Date   BREAST BIOPSY Right    BUNIONECTOMY  YRS AGO LEFT   RIGHT CONE AT [facility] 3 YRS AGO   BUNIONECTOMY Left    CATARACT EXTRACTION W/ INTRAOCULAR LENS IMPLANT  03/2010   Left   CATARACT EXTRACTION W/ INTRAOCULAR LENS IMPLANT Bilateral 11/2010   COLONOSCOPY  2013   NORMAL    ESOPHAGOGASTRODUODENOSCOPY N/A 11/23/2015   Procedure: ESOPHAGOGASTRODUODENOSCOPY (EGD);  Surgeon: Claudis RAYMOND Rivet, MD;  [facility];  Service: Endoscopy;  Laterality: N/A;   EUS N/A 12/19/2015   Procedure:  ESOPHAGEAL ENDOSCOPIC ULTRASOUND (EUS) RADIAL;  Surgeon: Toribio SHAUNNA Cedar, MD;  [facility]: [facility];  Service: Endoscopy;  Laterality: N/A;   FUNCTIONAL ENDOSCOPIC SINUS SURGERY     HAMMER TOE SURGERY     HAND SURGERY Right 1998   INGUINAL HERNIA REPAIR Left 03/11/2016   Procedure: LEFT INGUINAL HERINA REPAIR WITH MESH;  Surgeon: Donnice Lima, MD;  [facility];  Service: General;  Laterality: Left;   INSERTION OF MESH Left 03/11/2016   Procedure: INSERTION OF MESH;  Surgeon: Donnice Lima, MD;  [facility];  Service: General;  Laterality: Left;   IR RADIOLOGIST EVAL & MGMT  07/01/2017   LAMINECTOMY  03/2008   LAPAROTOMY N/A 11/11/2017   Procedure: MINI LAPAROTOMY;  Surgeon: Eloy Herring, MD;  [facility]: [facility];  Service: Gynecology;  Laterality: N/A;   LUMBAR LAMINECTOMY/DECOMPRESSION MICRODISCECTOMY N/A 12/23/2020   Procedure: Laminectomy and Foraminotomy - Lumbar one-two;  Surgeon: Joshua Alm RAMAN, MD;  Location: [facility] OR;  Service: Neurosurgery;  Laterality: N/A;   POLYPECTOMY     REVERSE SHOULDER ARTHROPLASTY Right 10/13/2018   Procedure: REVERSE SHOULDER ARTHROPLASTY;  Surgeon: Melita Drivers, MD;  [facility];  Service: Orthopedics;  Laterality: Right;    ROBOTIC ASSISTED TOTAL HYSTERECTOMY WITH BILATERAL SALPINGO OOPHERECTOMY Bilateral 11/11/2017   Procedure: XI ROBOTIC ASSISTED TOTAL HYSTERECTOMY WITH BILATERAL SALPINGO OOPHORECTOMY FOR UTERUS GREATER THAN 250 GRAMS;  Surgeon: Eloy Herring, MD;  [facility];  Service: Gynecology;  Laterality: Bilateral;   TONSILLECTOMY     TOTAL HIP ARTHROPLASTY  2008   Right    No Known Allergies  Outpatient Encounter Medications as of 12/15/2023  Medication Sig   atorvastatin  (LIPITOR) 10 MG tablet TAKE 1 TABLET BY MOUTH EVERY DAY   Cholecalciferol (VITAMIN D3 SUPER STRENGTH) 50 MCG (2000 UT) TABS Take 2,000 Units by mouth daily.   clobetasol ointment (TEMOVATE) 0.05 % Apply 1 application topically daily as needed  (vaginitis).   estradiol  (ESTRACE ) 0.5 MG tablet TAKE ONE TABLET BY MOUTH AT BEDTIME   hydrochlorothiazide  (HYDRODIURIL ) 25 MG tablet Take 1 tablet (25 mg total) by mouth daily.   latanoprost  (XALATAN ) 0.005 % ophthalmic solution Place 1 drop into both eyes at bedtime.   levothyroxine  (SYNTHROID ) 125 MCG tablet TAKE 1 TABLET BY MOUTH EVERY DAY   losartan  (COZAAR ) 100 MG tablet TAKE 1 TABLET BY MOUTH EVERY DAY   Multiple Vitamin (MULTIVITAMIN) tablet Take 1 tablet by mouth daily with lunch.   traMADol  (ULTRAM ) 50 MG tablet Take 1 tablet (50 mg total) by mouth 2 (two) times daily.   Turmeric (QC TUMERIC COMPLEX PO) Take 1,500 Units by mouth 2 (two) times daily.   vitamin C (ASCORBIC ACID ) 500 MG tablet Take 500 mg by mouth daily with lunch.   No facility-administered encounter medications on file as of 12/15/2023.    Review of Systems:  Review of Systems  Constitutional:  Negative for activity  change and appetite change.  HENT:  Positive for congestion.   Respiratory:  Negative for cough and shortness of breath.   Cardiovascular:  Negative for leg swelling.  Gastrointestinal:  Negative for constipation.  Genitourinary: Negative.   Musculoskeletal:  Positive for neck pain. Negative for arthralgias, gait problem and myalgias.  Skin: Negative.   Neurological:  Positive for headaches. Negative for dizziness and weakness.  Psychiatric/Behavioral:  Negative for confusion, dysphoric mood and sleep disturbance.     Health Maintenance  Topic Date Due   Zoster Vaccines- Shingrix (1 of 2) 08/22/1950   COVID-19 Vaccine (5 - 2024-25 season) 04/14/2023   Medicare Annual Wellness (AWV)  08/03/2023   INFLUENZA VACCINE  12/03/2023   DTaP/Tdap/Td (2 - Td or Tdap) 06/13/2030   Pneumococcal Vaccine: 50+ Years  Completed   DEXA SCAN  Completed   HPV VACCINES  Aged Out   Meningococcal B Vaccine  Aged Out   Pneumococcal Vaccine  Discontinued    Physical Exam: Vitals:   12/15/23 1304  BP: 138/84   Pulse: 81  Temp: (!) 97.5 F (36.4 C)  SpO2: 98%  Weight: 126 lb 9.6 oz (57.4 kg)  Height: 5' 1 (1.549 m)   Body mass index is 23.92 kg/m. Physical Exam Vitals reviewed.  Constitutional:      Appearance: Normal appearance.  HENT:     Head: Normocephalic.     Nose: Nose normal.     Mouth/Throat:     Mouth: Mucous membranes are moist.     Pharynx: Oropharynx is clear.  Eyes:     Pupils: Pupils are equal, round, and reactive to light.  Cardiovascular:     Rate and Rhythm: Normal rate and regular rhythm.     Pulses: Normal pulses.     Heart sounds: Normal heart sounds. No murmur heard. Pulmonary:     Effort: Pulmonary effort is normal.     Breath sounds: Normal breath sounds.  Abdominal:     General: Abdomen is flat. Bowel sounds are normal.     Palpations: Abdomen is soft.  Musculoskeletal:        General: No swelling.     Cervical back: Neck supple.  Skin:    General: Skin is warm.  Neurological:     General: No focal deficit present.     Mental Status: Carol Wells is alert and oriented to person, place, and time.  Psychiatric:        Mood and Affect: Mood normal.        Thought Content: Thought content normal.     Labs reviewed: Basic Metabolic Panel: Recent Labs    02/25/23 0800 08/19/23 0832 09/06/23 0800 12/13/23 0810  NA 135 135 133* 134*  K 4.4 4.3 4.2 4.1  CL 97* 98 95* 95*  CO2 30 32 33* 32  GLUCOSE 88 84 74 81  BUN 17 15 20 16   CREATININE 0.69 0.61 0.64 0.66  CALCIUM  9.0 9.0 9.2 9.4  TSH 2.82 2.45  --   --    Liver Function Tests: Recent Labs    02/25/23 0800 08/19/23 0832 12/13/23 0810  AST 33 33 32  ALT 21 21 21   BILITOT 0.6 0.7 0.8  PROT 7.0 7.1 7.3   No results for input(s): LIPASE, AMYLASE in the last 8760 hours. No results for input(s): AMMONIA in the last 8760 hours. CBC: Recent Labs    08/19/23 0832 09/06/23 0800 12/13/23 0810  WBC 4.9 4.2 4.0  NEUTROABS 2,842 2,276 2,148  HGB 12.2  11.8 12.6  HCT 37.6 35.7 38.6  MCV  91.9 89.7 89.8  PLT CANCELED 105* 94*   Lipid Panel: Recent Labs    02/25/23 0800 08/19/23 0832  CHOL 148 148  HDL 59 65  LDLCALC 75 68  TRIG 48 72  CHOLHDL 2.5 2.3   No results found for: HGBA1C  Procedures since last visit: No results found.  Assessment/Plan 1. Primary hypertension (Primary) Hydrochlorothiazide  and Cozaar    2. Neck pain ***  3. New daily persistent headache ***  4. Hypothyroidism, unspecified type ***  5. Hyperlipidemia, unspecified hyperlipidemia type ***    Labs/tests ordered:  * No order type specified * Next appt:  Visit date not found

## 2023-12-23 DIAGNOSIS — N815 Vaginal enterocele: Secondary | ICD-10-CM | POA: Diagnosis not present

## 2023-12-23 DIAGNOSIS — Z4689 Encounter for fitting and adjustment of other specified devices: Secondary | ICD-10-CM | POA: Diagnosis not present

## 2023-12-25 DIAGNOSIS — J323 Chronic sphenoidal sinusitis: Secondary | ICD-10-CM | POA: Insufficient documentation

## 2023-12-25 NOTE — Progress Notes (Signed)
 CC: Chronic left sphenoid sinusitis, chronic headaches  HPI:  Carol Wells is a 89 y.o. female who presents today complaining of chronic left sphenoid sinusitis and chronic headaches.  She has been symptomatic for several years.  She previously underwent sinus surgery in [place]  in the 90s.  Her recent brain MRI scan showed left sphenoid sinusitis.  She was previously seen at [facility].  She was treated with multiple antibiotics, including amoxicillin and levofloxacin.  However, she continues to be symptomatic.  She complains of frequent headaches.  The patient cannot tolerate the use of steroid nasal spray secondary to her glaucoma.  The patient has a history of migraine headaches.  She is currently being followed by a neurologist.  Past Medical History:  Diagnosis Date   Anemia 11/2015   Arthritis    Blood transfusion 2017 JULY 22   Carotid stenosis    Dopplers 3/14: RICA 40-59%, LICA 0-39%   Cataract    REMOVED   Chronic headaches    HX MIGRAINES   Collagenous colitis    Diverticulosis 2013   Colonoscopy   Gastric ulcer 11/2015   Glaucoma    BOTH EYES   Hiatal hernia    History of spinal stenosis    History of uterine fibroid    Hx of cardiac catheterization    LHC 3/14: no angiographic CAD, EF 65%   Hx of echocardiogram    Echocardiogram 06/21/12: Mild LVH, EF 60-65%, grade 2 diastolic dysfunction, mild MR   Hyperlipidemia    Hypertension    Hypothyroidism    Internal hemorrhoids 2009   Colonoscopy   Migraines    UTI (urinary tract infection) HX OF    Past Surgical History:  Procedure Laterality Date   BREAST BIOPSY Right    BUNIONECTOMY  YRS AGO LEFT   RIGHT CONE AT [facility] 3 YRS AGO   BUNIONECTOMY Left    CATARACT EXTRACTION W/ INTRAOCULAR LENS IMPLANT  03/2010   Left   CATARACT EXTRACTION W/ INTRAOCULAR LENS IMPLANT Bilateral 11/2010   COLONOSCOPY  2013   NORMAL    ESOPHAGOGASTRODUODENOSCOPY N/A 11/23/2015   Procedure: ESOPHAGOGASTRODUODENOSCOPY (EGD);   Surgeon: Claudis RAYMOND Rivet, MD;  [facility];  Service: Endoscopy;  Laterality: N/A;   EUS N/A 12/19/2015   Procedure: ESOPHAGEAL ENDOSCOPIC ULTRASOUND (EUS) RADIAL;  Surgeon: Toribio SHAUNNA Cedar, MD;  [facility];  Service: Endoscopy;  Laterality: N/A;   FUNCTIONAL ENDOSCOPIC SINUS SURGERY     HAMMER TOE SURGERY     HAND SURGERY Right 1998   INGUINAL HERNIA REPAIR Left 03/11/2016   Procedure: LEFT INGUINAL HERINA REPAIR WITH MESH;  Surgeon: Donnice Lima, MD;  [facility];  Service: General;  Laterality: Left;   INSERTION OF MESH Left 03/11/2016   Procedure: INSERTION OF MESH;  Surgeon: Donnice Lima, MD;  [facility];  Service: General;  Laterality: Left;   IR RADIOLOGIST EVAL & MGMT  07/01/2017   LAMINECTOMY  03/2008   LAPAROTOMY N/A 11/11/2017   Procedure: MINI LAPAROTOMY;  Surgeon: Eloy Herring, MD;  [facility];  Service: Gynecology;  Laterality: N/A;   LUMBAR LAMINECTOMY/DECOMPRESSION MICRODISCECTOMY N/A 12/23/2020   Procedure: Laminectomy and Foraminotomy - Lumbar one-two;  Surgeon: Joshua Alm RAMAN, MD;  Location: [facility] OR;  Service: Neurosurgery;  Laterality: N/A;   POLYPECTOMY     REVERSE SHOULDER ARTHROPLASTY Right 10/13/2018   Procedure: REVERSE SHOULDER ARTHROPLASTY;  Surgeon: Melita Drivers, MD;  [facility];  Service: Orthopedics;  Laterality: Right;   ROBOTIC ASSISTED TOTAL HYSTERECTOMY WITH BILATERAL SALPINGO OOPHERECTOMY Bilateral 11/11/2017   Procedure: XI ROBOTIC ASSISTED TOTAL HYSTERECTOMY WITH BILATERAL SALPINGO OOPHORECTOMY FOR UTERUS GREATER THAN 250 GRAMS;  Surgeon: Eloy Herring, MD;  [facility];  Service: Gynecology;  Laterality: Bilateral;   TONSILLECTOMY     TOTAL HIP ARTHROPLASTY  2008   Right    Family History  Problem Relation Age of Onset   Anuerysm Father 36   Colon cancer Brother 67   Migraines Grandchild     Social History:  reports that she has never smoked. She has never used smokeless tobacco. She reports  that she does not currently use alcohol. She reports that she does not use drugs.  Allergies: No Known Allergies  Prior to Admission medications   Medication Sig Start Date End Date Taking? Authorizing Provider  atorvastatin  (LIPITOR) 10 MG tablet TAKE 1 TABLET BY MOUTH EVERY DAY 12/06/23  Yes Gupta, Anjali L, MD  Cholecalciferol (VITAMIN D3 SUPER STRENGTH) 50 MCG (2000 UT) TABS Take 2,000 Units by mouth daily.   Yes [provider]  clobetasol ointment (TEMOVATE) 0.05 % Apply 1 application topically daily as needed (vaginitis).   Yes [provider]  estradiol  (ESTRACE ) 0.5 MG tablet TAKE ONE TABLET BY MOUTH AT BEDTIME 12/13/23  Yes Charlanne Fredia CROME, MD  hydrochlorothiazide  (HYDRODIURIL ) 25 MG tablet Take 1 tablet (25 mg total) by mouth daily. 09/15/23  Yes Charlanne Fredia CROME, MD  latanoprost  (XALATAN ) 0.005 % ophthalmic solution Place 1 drop into both eyes at bedtime.   Yes [provider]  levothyroxine  (SYNTHROID ) 125 MCG tablet TAKE 1 TABLET BY MOUTH EVERY DAY 07/21/23  Yes Charlanne Fredia CROME, MD  losartan  (COZAAR ) 100 MG tablet TAKE 1 TABLET BY MOUTH EVERY DAY 06/21/23  Yes Gupta, Anjali L, MD  Multiple Vitamin (MULTIVITAMIN) tablet Take 1 tablet by mouth daily with lunch.   Yes [provider]  traMADol  (ULTRAM ) 50 MG tablet Take 1 tablet (50 mg total) by mouth 2 (two) times daily. 09/08/23  Yes Charlanne Fredia CROME, MD  Turmeric (QC TUMERIC COMPLEX PO) Take 1,500 Units by mouth 2 (two) times daily.   Yes [provider]  vitamin C (ASCORBIC ACID ) 500 MG tablet Take 500 mg by mouth daily with lunch.   Yes [provider]    Blood pressure (!) 142/78, pulse 76, SpO2 94%. Exam: General: Communicates without difficulty, well nourished, no acute distress. Head: Normocephalic, no evidence injury, no tenderness, facial buttresses intact without stepoff. Face/sinus: No tenderness to palpation and percussion. Facial movement is normal and symmetric. Eyes: PERRL,  EOMI. No scleral icterus, conjunctivae clear. Neuro: CN II exam reveals vision grossly intact.  No nystagmus at any point of gaze. Ears: Auricles well formed without lesions.  Ear canals are intact without mass or lesion.  No erythema or edema is appreciated.  The TMs are intact without fluid. Nose: External evaluation reveals normal support and skin without lesions.  Dorsum is intact.  Anterior rhinoscopy reveals congested mucosa over anterior aspect of inferior turbinates and intact septum.  No purulence noted. Oral:  Oral cavity and oropharynx are intact, symmetric, without erythema or edema.  Mucosa is moist without lesions. Neck: Full range of motion without pain.  There is no significant lymphadenopathy.  No masses palpable.  Thyroid  bed within normal limits to palpation.  Parotid glands and submandibular glands equal bilaterally without mass.  Trachea is midline. Neuro:  CN 2-12 grossly intact.   Procedure:  Flexible Nasal Endoscopy: Description:  Risks, benefits, and alternatives of flexible endoscopy were explained to the patient.  Specific mention was made of the risk of throat numbness with difficulty swallowing, possible bleeding from the nose and mouth, and pain from the procedure.  The patient gave oral consent to proceed.  The flexible scope was inserted into the right nasal cavity.  Endoscopy of the interior nasal cavity, superior, inferior, and middle meatus was performed. The sphenoid-ethmoid recess was examined. Edematous mucosa was noted. Olfactory cleft was clear.  Nasopharynx was clear.  Turbinates were hypertrophied but without mass.  The procedure was repeated on the contralateral side with similar findings.  The patient tolerated the procedure well.   Assessment: 1.  Chronic left sphenoid sinusitis.  The patient has not responded to medical treatments for the past 2 years.  She was treated with multiple antibiotics, including amoxicillin and levofloxacin.  She cannot tolerate the use of  steroid nasal spray due to her glaucoma. 2.  Chronic headaches, likely secondary to her sphenoid sinusitis.  Plan: 1.  The physical exam and nasal endoscopy findings are reviewed with the patient. 2.  Nasal saline irrigation daily. 3.  Sinus CT scan to evaluate the extent of her sinusitis. 4.  Based on the above findings, the patient will benefit from surgical intervention with left sphenoidectomy and tissue removal.  The risk, benefits, alternatives, and details of the procedure are reviewed.  Questions are invited and answered. 5.  The patient would like to proceed with the procedure.  Mykaela Arena W Cheridan Kibler 12/25/2023, 1:21 PM

## 2023-12-28 ENCOUNTER — Telehealth: Payer: Self-pay | Admitting: Neurology

## 2023-12-28 NOTE — Telephone Encounter (Signed)
 Pt has asked to cx this appointment as a result of her having a sinus procedure in the month of Oct, she will f/u after.  Pt would like a call from RN to discuss.

## 2023-12-28 NOTE — Telephone Encounter (Signed)
 Called and spoke to pt and stated we are aware and appt cancelled. To call back once procedure completed or when they would like to be seen. Pt voiced gratitude and understanding of all discussed

## 2024-01-10 ENCOUNTER — Other Ambulatory Visit: Payer: Self-pay | Admitting: Internal Medicine

## 2024-01-13 ENCOUNTER — Other Ambulatory Visit: Payer: Self-pay | Admitting: Internal Medicine

## 2024-01-14 NOTE — Telephone Encounter (Signed)
Controlled substance 

## 2024-01-17 ENCOUNTER — Ambulatory Visit: Admitting: Neurology

## 2024-01-26 ENCOUNTER — Ambulatory Visit (HOSPITAL_BASED_OUTPATIENT_CLINIC_OR_DEPARTMENT_OTHER)
Admission: RE | Admit: 2024-01-26 | Discharge: 2024-01-26 | Disposition: A | Discharge status: Home / Self Care | Source: Ambulatory Visit | Attending: Otolaryngology | Admitting: Otolaryngology

## 2024-01-26 DIAGNOSIS — Z471 Aftercare following joint replacement surgery: Secondary | ICD-10-CM | POA: Diagnosis not present

## 2024-01-26 DIAGNOSIS — J323 Chronic sphenoidal sinusitis: Secondary | ICD-10-CM | POA: Diagnosis not present

## 2024-02-07 ENCOUNTER — Encounter (HOSPITAL_BASED_OUTPATIENT_CLINIC_OR_DEPARTMENT_OTHER): Payer: Self-pay | Admitting: Otolaryngology

## 2024-02-07 ENCOUNTER — Other Ambulatory Visit: Payer: Self-pay

## 2024-02-08 ENCOUNTER — Encounter (HOSPITAL_BASED_OUTPATIENT_CLINIC_OR_DEPARTMENT_OTHER)
Admission: RE | Admit: 2024-02-08 | Discharge: 2024-02-08 | Disposition: A | Discharge status: Home / Self Care | Source: Ambulatory Visit | Attending: Otolaryngology | Admitting: Otolaryngology

## 2024-02-08 DIAGNOSIS — Z01818 Encounter for other preprocedural examination: Secondary | ICD-10-CM | POA: Diagnosis not present

## 2024-02-08 LAB — BASIC METABOLIC PANEL WITH GFR
Anion gap: 8 (ref 5–15)
BUN: 16 mg/dL (ref 8–23)
CO2: 28 mmol/L (ref 22–32)
Calcium: 9 mg/dL (ref 8.9–10.3)
Chloride: 95 mmol/L — ABNORMAL LOW (ref 98–111)
Creatinine, Ser: 0.68 mg/dL (ref 0.44–1.00)
GFR, Estimated: >60 mL/min (ref >60)
Glucose, Bld: 101 mg/dL — ABNORMAL HIGH (ref 70–99)
Potassium: 4.4 mmol/L (ref 3.5–5.1)
Sodium: 131 mmol/L — ABNORMAL LOW (ref 135–145)

## 2024-02-14 ENCOUNTER — Encounter (HOSPITAL_BASED_OUTPATIENT_CLINIC_OR_DEPARTMENT_OTHER): Payer: Self-pay | Admitting: Otolaryngology

## 2024-02-14 ENCOUNTER — Ambulatory Visit (HOSPITAL_BASED_OUTPATIENT_CLINIC_OR_DEPARTMENT_OTHER): Admitting: Anesthesiology

## 2024-02-14 ENCOUNTER — Encounter (HOSPITAL_BASED_OUTPATIENT_CLINIC_OR_DEPARTMENT_OTHER)
Admission: RE | Disposition: A | Discharge status: Home / Self Care | Payer: Self-pay | Source: Home / Self Care | Attending: Otolaryngology

## 2024-02-14 ENCOUNTER — Ambulatory Visit (HOSPITAL_BASED_OUTPATIENT_CLINIC_OR_DEPARTMENT_OTHER)
Admission: RE | Admit: 2024-02-14 | Discharge: 2024-02-14 | Disposition: A | Discharge status: Home / Self Care | Attending: Otolaryngology | Admitting: Otolaryngology

## 2024-02-14 DIAGNOSIS — E039 Hypothyroidism, unspecified: Secondary | ICD-10-CM | POA: Insufficient documentation

## 2024-02-14 DIAGNOSIS — G4730 Sleep apnea, unspecified: Secondary | ICD-10-CM | POA: Diagnosis not present

## 2024-02-14 DIAGNOSIS — J323 Chronic sphenoidal sinusitis: Secondary | ICD-10-CM | POA: Insufficient documentation

## 2024-02-14 DIAGNOSIS — H409 Unspecified glaucoma: Secondary | ICD-10-CM | POA: Diagnosis not present

## 2024-02-14 DIAGNOSIS — Z01818 Encounter for other preprocedural examination: Secondary | ICD-10-CM

## 2024-02-14 DIAGNOSIS — J019 Acute sinusitis, unspecified: Secondary | ICD-10-CM | POA: Diagnosis not present

## 2024-02-14 DIAGNOSIS — Z8711 Personal history of peptic ulcer disease: Secondary | ICD-10-CM | POA: Insufficient documentation

## 2024-02-14 DIAGNOSIS — J338 Other polyp of sinus: Secondary | ICD-10-CM

## 2024-02-14 DIAGNOSIS — B449 Aspergillosis, unspecified: Secondary | ICD-10-CM | POA: Diagnosis not present

## 2024-02-14 DIAGNOSIS — B479 Mycetoma, unspecified: Secondary | ICD-10-CM | POA: Diagnosis not present

## 2024-02-14 DIAGNOSIS — I10 Essential (primary) hypertension: Secondary | ICD-10-CM | POA: Diagnosis not present

## 2024-02-14 SURGERY — SPHENOIDECTOMY
Anesthesia: General | Site: Nose | Laterality: Left

## 2024-02-14 MED ORDER — 0.9 % SODIUM CHLORIDE (POUR BTL) OPTIME
TOPICAL | Status: DC | PRN
  Administered 2024-02-14: 300 mL

## 2024-02-14 MED ORDER — LIDOCAINE 2% (20 MG/ML) 5 ML SYRINGE
INTRAMUSCULAR | Status: AC
  Filled 2024-02-14: qty 5

## 2024-02-14 MED ORDER — LACTATED RINGERS IV SOLN: INTRAVENOUS | Status: DC

## 2024-02-14 MED ORDER — MUPIROCIN 2 % EX OINT
TOPICAL_OINTMENT | CUTANEOUS | Status: AC
Start: 2024-02-14 — End: 2024-02-14
  Filled 2024-02-14: qty 22

## 2024-02-14 MED ORDER — ONDANSETRON HCL 4 MG/2ML IJ SOLN
INTRAMUSCULAR | Status: DC | PRN
Start: 2024-02-14 — End: 2024-02-14
  Administered 2024-02-14: 4 mg via INTRAVENOUS

## 2024-02-14 MED ORDER — LIDOCAINE-EPINEPHRINE 1 %-1:100000 IJ SOLN
INTRAMUSCULAR | Status: AC
  Filled 2024-02-14: qty 1

## 2024-02-14 MED ORDER — CEFAZOLIN SODIUM-DEXTROSE 1-4 GM/50ML-% IV SOLN
INTRAVENOUS | Status: DC | PRN
Start: 2024-02-14 — End: 2024-02-14
  Administered 2024-02-14: 2 g via INTRAVENOUS

## 2024-02-14 MED ORDER — FENTANYL CITRATE (PF) 100 MCG/2ML IJ SOLN
INTRAMUSCULAR | Status: DC | PRN
  Administered 2024-02-14: 25 ug via INTRAVENOUS

## 2024-02-14 MED ORDER — ACETAMINOPHEN 10 MG/ML IV SOLN
500.0000 mg | Freq: Once | INTRAVENOUS | Status: DC | PRN
  Administered 2024-02-14: 500 mg via INTRAVENOUS

## 2024-02-14 MED ORDER — ONDANSETRON HCL 4 MG/2ML IJ SOLN
INTRAMUSCULAR | Status: AC
  Filled 2024-02-14: qty 2

## 2024-02-14 MED ORDER — OXYMETAZOLINE HCL 0.05 % NA SOLN
NASAL | Status: DC | PRN
  Administered 2024-02-14: 1 via TOPICAL

## 2024-02-14 MED ORDER — FENTANYL CITRATE (PF) 100 MCG/2ML IJ SOLN
INTRAMUSCULAR | Status: AC
  Filled 2024-02-14: qty 2

## 2024-02-14 MED ORDER — DROPERIDOL 2.5 MG/ML IJ SOLN: 0.6250 mg | Freq: Once | INTRAMUSCULAR | Status: DC | PRN

## 2024-02-14 MED ORDER — OXYCODONE HCL 5 MG PO TABS
ORAL_TABLET | ORAL | Status: AC
  Filled 2024-02-14: qty 1

## 2024-02-14 MED ORDER — ACETAMINOPHEN 10 MG/ML IV SOLN
INTRAVENOUS | Status: AC
  Filled 2024-02-14: qty 100

## 2024-02-14 MED ORDER — OXYMETAZOLINE HCL 0.05 % NA SOLN
NASAL | Status: AC
  Filled 2024-02-14: qty 30

## 2024-02-14 MED ORDER — PROPOFOL 10 MG/ML IV BOLUS
INTRAVENOUS | Status: DC | PRN
  Administered 2024-02-14: 50 mg via INTRAVENOUS
  Administered 2024-02-14: 30 mg via INTRAVENOUS
  Administered 2024-02-14: 40 mg via INTRAVENOUS

## 2024-02-14 MED ORDER — DEXAMETHASONE SODIUM PHOSPHATE 4 MG/ML IJ SOLN
INTRAMUSCULAR | Status: DC | PRN
  Administered 2024-02-14: 5 mg via INTRAVENOUS

## 2024-02-14 MED ORDER — LIDOCAINE 2% (20 MG/ML) 5 ML SYRINGE
INTRAMUSCULAR | Status: DC | PRN
  Administered 2024-02-14: 40 mg via INTRAVENOUS

## 2024-02-14 MED ORDER — ROCURONIUM BROMIDE 10 MG/ML (PF) SYRINGE
PREFILLED_SYRINGE | INTRAVENOUS | Status: DC | PRN
Start: 2024-02-14 — End: 2024-02-14
  Administered 2024-02-14: 40 mg via INTRAVENOUS

## 2024-02-14 MED ORDER — MUPIROCIN 2 % EX OINT
TOPICAL_OINTMENT | CUTANEOUS | Status: DC | PRN
  Administered 2024-02-14: 1 via TOPICAL

## 2024-02-14 MED ORDER — SUGAMMADEX SODIUM 200 MG/2ML IV SOLN
INTRAVENOUS | Status: DC | PRN
  Administered 2024-02-14: 150 mg via INTRAVENOUS

## 2024-02-14 MED ORDER — OXYCODONE HCL 5 MG/5ML PO SOLN: 5.0000 mg | Freq: Once | ORAL | Status: AC | PRN

## 2024-02-14 MED ORDER — OXYCODONE HCL 5 MG PO TABS
5.0000 mg | ORAL_TABLET | Freq: Once | ORAL | Status: AC | PRN
  Administered 2024-02-14: 5 mg via ORAL

## 2024-02-14 MED ORDER — FENTANYL CITRATE (PF) 100 MCG/2ML IJ SOLN
25.0000 ug | INTRAMUSCULAR | Status: DC | PRN
  Administered 2024-02-14 (×3): 25 ug via INTRAVENOUS

## 2024-02-14 MED ORDER — ROCURONIUM BROMIDE 10 MG/ML (PF) SYRINGE
PREFILLED_SYRINGE | INTRAVENOUS | Status: AC
  Filled 2024-02-14: qty 10

## 2024-02-14 MED ORDER — BUPIVACAINE HCL (PF) 0.25 % IJ SOLN
INTRAMUSCULAR | Status: AC
  Filled 2024-02-14: qty 90

## 2024-02-14 SURGICAL SUPPLY — 40 items
BLADE ROTATE RAD 12 4 M4 (BLADE) IMPLANT
BLADE ROTATE RAD 40 4 M4 (BLADE) IMPLANT
BLADE ROTATE TRICUT 4X13 M4 (BLADE) ×1 IMPLANT
BUR HS RAD FRONTAL 3 (BURR) IMPLANT
CANISTER SUC SOCK COL 7IN (MISCELLANEOUS) ×1 IMPLANT
CANISTER SUCT 1200ML W/VALVE (MISCELLANEOUS) ×1 IMPLANT
COAGULATOR SUCT 8FR VV (MISCELLANEOUS) IMPLANT
DEFOGGER MIRROR 1QT (MISCELLANEOUS) ×1 IMPLANT
DRSG NASOPORE 8CM (GAUZE/BANDAGES/DRESSINGS) IMPLANT
DRSG TELFA 3X8 NADH STRL (GAUZE/BANDAGES/DRESSINGS) IMPLANT
ELECTRODE REM PT RTRN 9FT ADLT (ELECTROSURGICAL) IMPLANT
GAUZE SPONGE 2X2 STRL 8-PLY (GAUZE/BANDAGES/DRESSINGS) ×1 IMPLANT
GLOVE BIO SURGEON STRL SZ7.5 (GLOVE) ×1 IMPLANT
GLOVE SURG SS PI 7.0 STRL IVOR (GLOVE) IMPLANT
GOWN STRL REUS W/ TWL LRG LVL3 (GOWN DISPOSABLE) ×2 IMPLANT
HEMOSTAT SURGICEL 2X14 (HEMOSTASIS) IMPLANT
IV 0.9% NACL 250 ML (IV SOLUTION) IMPLANT
IV NS 500ML BAXH (IV SOLUTION) ×1 IMPLANT
MANIFOLD NEPTUNE II (INSTRUMENTS) IMPLANT
NDL HYPO 25X1 1.5 SAFETY (NEEDLE) IMPLANT
NEEDLE HYPO 25X1 1.5 SAFETY (NEEDLE) IMPLANT
NS IRRIG 1000ML POUR BTL (IV SOLUTION) IMPLANT
PACK BASIN DAY SURGERY FS (CUSTOM PROCEDURE TRAY) ×1 IMPLANT
PACK ENT DAY SURGERY (CUSTOM PROCEDURE TRAY) ×1 IMPLANT
SLEEVE SCD COMPRESS KNEE MED (STOCKING) ×1 IMPLANT
SPIKE FLUID TRANSFER (MISCELLANEOUS) IMPLANT
SPLINT NASAL AIRWAY SILICONE (MISCELLANEOUS) IMPLANT
SPONGE NEURO XRAY DETECT 1X3 (DISPOSABLE) ×1 IMPLANT
SUCTION TUBE FRAZIER 10FR DISP (SUCTIONS) IMPLANT
SUT PLAIN 4 0 ~~LOC~~ 1 (SUTURE) IMPLANT
SUT PROLENE 3 0 PS 2 (SUTURE) IMPLANT
SYR 3ML 23GX1 SAFETY (SYRINGE) IMPLANT
SYR 50ML LL SCALE MARK (SYRINGE) ×1 IMPLANT
TOWEL GREEN STERILE FF (TOWEL DISPOSABLE) ×1 IMPLANT
TRACKER ENT INSTRUMENT (MISCELLANEOUS) ×1 IMPLANT
TRACKER ENT PATIENT (MISCELLANEOUS) ×1 IMPLANT
TUBE CONNECTING 20X1/4 (TUBING) ×1 IMPLANT
TUBE SALEM SUMP 16F (TUBING) IMPLANT
TUBING STRAIGHTSHOT EPS 5PK (TUBING) ×1 IMPLANT
YANKAUER SUCT BULB TIP NO VENT (SUCTIONS) ×1 IMPLANT

## 2024-02-14 NOTE — Anesthesia Preprocedure Evaluation (Signed)
 Anesthesia Evaluation  Patient identified by MRN, date of birth, ID band Patient awake    Reviewed: Allergy & Precautions, NPO status , Patient's Chart, lab work & pertinent test results  History of Anesthesia Complications Negative for: history of anesthetic complications  Airway Mallampati: II  TM Distance: >3 FB Neck ROM: Limited    Dental  (+) Chipped   Pulmonary sleep apnea and Continuous Positive Airway Pressure Ventilation , neg COPD, Patient abstained from smoking.Not current smoker   Pulmonary exam normal breath sounds clear to auscultation       Cardiovascular Exercise Tolerance: Good METShypertension, Pt. on medications (-) CAD and (-) Past MI (-) dysrhythmias  Rhythm:Regular Rate:Normal - Systolic murmurs    Neuro/Psych  Headaches  negative psych ROS   GI/Hepatic hiatal hernia, PUD,neg GERD  ,,(+)     (-) substance abuse    Endo/Other  neg diabetesHypothyroidism    Renal/GU negative Renal ROS     Musculoskeletal   Abdominal   Peds  Hematology Slight thrombocytopenia, no clinical manifestations   Anesthesia Other Findings Past Medical History: 11/2015: Anemia No date: Arthritis 2017 JULY 22: Blood transfusion No date: Carotid stenosis     Comment:  Dopplers 3/14: RICA 40-59%, LICA 0-39% No date: Cataract     Comment:  REMOVED No date: Chronic headaches     Comment:  HX MIGRAINES No date: Collagenous colitis 2013: Diverticulosis     Comment:  Colonoscopy 11/2015: Gastric ulcer No date: Glaucoma     Comment:  BOTH EYES No date: Hiatal hernia No date: History of spinal stenosis No date: History of uterine fibroid No date: Hx of cardiac catheterization     Comment:  LHC 3/14: no angiographic CAD, EF 65% No date: Hx of echocardiogram     Comment:  Echocardiogram 06/21/12: Mild LVH, EF 60-65%, grade 2               diastolic dysfunction, mild MR No date: Hyperlipidemia No date:  Hypertension No date: Hypothyroidism 2009: Internal hemorrhoids     Comment:  Colonoscopy No date: Migraines HX OF: UTI (urinary tract infection)  Reproductive/Obstetrics                              Anesthesia Physical Anesthesia Plan  ASA: 2  Anesthesia Plan: General   Post-op Pain Management: Ofirmev  IV (intra-op)*   Induction: Intravenous  PONV Risk Score and Plan: 4 or greater and Ondansetron , Dexamethasone  and Treatment may vary due to age or medical condition  Airway Management Planned: Oral ETT  Additional Equipment: None  Intra-op Plan:   Post-operative Plan: Extubation in OR  Informed Consent: I have reviewed the patients History and Physical, chart, labs and discussed the procedure including the risks, benefits and alternatives for the proposed anesthesia with the patient or authorized representative who has indicated his/her understanding and acceptance.     Dental advisory given  Plan Discussed with: CRNA and Surgeon  Anesthesia Plan Comments: (Discussed risks of anesthesia with patient, including PONV, sore throat, lip/dental/eye damage, post operative cognitive dysfunction. Rare risks discussed as well, such as cardiorespiratory and neurological sequelae, and allergic reactions. Discussed the role of CRNA in patient's perioperative care. Patient understands.)        Anesthesia Quick Evaluation

## 2024-02-14 NOTE — Transfer of Care (Signed)
 Immediate Anesthesia Transfer of Care Note  Patient: Carol Wells  Procedure(s) Performed: ENDOSCOPIC LEFT SPHENOIDOTOMY WITH POLYP REMOVAL (Left: Nose) ENDOSCOPIC SINUS SURGERY USING COMPUTER-ASSISTED NAVIGATION (Left: Nose)  Patient Location: PACU  Anesthesia Type:General  Level of Consciousness: awake and alert   Airway & Oxygen Therapy: Patient Spontanous Breathing and Patient connected to face mask oxygen  Post-op Assessment: Report given to RN and Post -op Vital signs reviewed and stable  Post vital signs: Reviewed and stable  Last Vitals:  Vitals Value Taken Time  BP 192/102 02/14/24 08:45  Temp    Pulse 79 02/14/24 08:47  Resp 16 02/14/24 08:47  SpO2 100 % 02/14/24 08:47  Vitals shown include unfiled device data.  Last Pain:  Vitals:   02/14/24 0638  TempSrc: Temporal  PainSc: 8          Complications: No notable events documented.

## 2024-02-14 NOTE — Discharge Instructions (Addendum)
 POSTOPERATIVE INSTRUCTIONS FOR PATIENTS HAVING NASAL OR SINUS OPERATIONS ACTIVITY: Restrict activity at home for the first two days, resting as much as possible. Light activity is best. You may usually return to work within a week. You should refrain from nose blowing, strenuous activity, or heavy lifting greater than 20lbs for a total of one week after your operation.  If sneezing cannot be avoided, sneeze with your mouth open. DISCOMFORT: You may experience a dull headache and pressure along with nasal congestion and discharge. These symptoms may be worse during the first week after the operation but may last as long as two to four weeks.  Please take Tylenol  or the pain medication that has been prescribed for you. Do not take aspirin  or aspirin  containing medications since they may cause bleeding.  You may experience symptoms of post nasal drainage, nasal congestion, headaches and fatigue for two or three months after your operation.  BLEEDING: You may have some blood tinged nasal drainage for approximately two weeks after the operation.  The discharge will be worse for the first week.  Please call our office at [phone] or go to the nearest hospital emergency room if you experience any of the following: heavy, bright red blood from your nose or mouth that lasts longer than 15 minutes or coughing up or vomiting bright red blood or blood clots. GENERAL CONSIDERATIONS: A gauze dressing will be placed on your upper lip to absorb any drainage after the operation. You may need to change this several times a day.  If you do not have very much drainage, you may remove the dressing.  Remember that you may gently wipe your nose with a tissue and sniff in, but DO NOT blow your nose. Please keep all of your postoperative appointments.  Your final results after the operation will depend on proper follow-up.  The initial visit is usually 2 to 5 days after the operation.  During this visit, the remaining nasal  packing and internal septal splints will be removed.  Your nasal and sinus cavities will be cleaned.  During the second visit, your nasal and sinus cavities will be cleaned again. Have someone drive you to your first two postoperative appointments.  How you care for your nose after the operation will influence the results that you obtain.  You should follow all directions, take your medication as prescribed, and call our office [phone] with any problems or questions. You may be more comfortable sleeping with your head elevated on two pillows. Do not take any medications that we have not prescribed or recommended. WARNING SIGNS: if any of the following should occur, please call our office: Persistent fever greater than 102F. Persistent vomiting. Severe and constant pain that is not relieved by prescribed pain medication. Trauma to the nose. Rash or unusual side effects from any medicines.    Post Anesthesia Home Care Instructions  Activity: Get plenty of rest for the remainder of the day. A responsible individual must stay with you for 24 hours following the procedure.  For the next 24 hours, DO NOT: -Drive a car -Advertising copywriter -Drink alcoholic beverages -Take any medication unless instructed by your physician -Make any legal decisions or sign important papers.  Meals: Start with liquid foods such as gelatin or soup. Progress to regular foods as tolerated. Avoid greasy, spicy, heavy foods. If nausea and/or vomiting occur, drink only clear liquids until the nausea and/or vomiting subsides. Call your physician if vomiting continues.  Special Instructions/Symptoms: Your throat may feel dry  or sore from the anesthesia or the breathing tube placed in your throat during surgery. If this causes discomfort, gargle with warm salt water . The discomfort should disappear within 24 hours.  If you had a scopolamine patch placed behind your ear for the management of post- operative nausea  and/or vomiting:  1. The medication in the patch is effective for 72 hours, after which it should be removed.  Wrap patch in a tissue and discard in the trash. Wash hands thoroughly with soap and water . 2. You may remove the patch earlier than 72 hours if you experience unpleasant side effects which may include dry mouth, dizziness or visual disturbances. 3. Avoid touching the patch. Wash your hands with soap and water  after contact with the patch.  No tylenol  until after 3pm today.

## 2024-02-14 NOTE — Op Note (Signed)
 DATE OF PROCEDURE: 02/14/2024  OPERATIVE REPORT   SURGEON: Daniel Moccasin, MD   PREOPERATIVE DIAGNOSES:  1.  Chronic left sphenoid sinusitis and polyposis  POSTOPERATIVE DIAGNOSES:  1.  Chronic left sphenoid sinusitis with fungus ball  PROCEDURE PERFORMED:  1.  Endoscopic left sphenoidotomy with fungus ball removal 2.  FUSION stereotactic image guidance  ANESTHESIA: General endotracheal tube anesthesia.   COMPLICATIONS: None.   ESTIMATED BLOOD LOSS: 50 mL.   INDICATION FOR PROCEDURE: Carol Wells is a 89 y.o. female with a history of chronic left sphenoid sinusitis and chronic headaches. She has been symptomatic for several years. She previously underwent sinus surgery in [place]  in the 90s. Her recent brain MRI scan showed left sphenoid sinusitis/opacification. She was previously seen at [facility]. She was treated with multiple antibiotics, including amoxicillin and levofloxacin. However, she continued to be symptomatic.  Based on the above findings, the decision was made for the patient to undergo the above-stated procedures. The risks, benefits, alternatives, and details of the procedures were discussed with the patient. Questions were invited and answered. Informed consent was obtained.   DESCRIPTION OF PROCEDURE: The patient was taken to the operating room and placed supine on the operating table. General endotracheal tube anesthesia was administered by the anesthesiologist. The patient was positioned, and prepped and draped in the standard fashion for nasal surgery. Pledgets soaked with Afrin were placed in both nasal cavities for decongestion. The pledgets were subsequently removed. The FUSION stereotactic image guidance marker was placed. The image guidance system was functional throughout the case.  Using a 0 endoscope, the left nasal cavity was examined.  The superior turbinate was identified and lateralized, exposing the sphenoid antrum.  The sphenoid opening was entered and  enlarged.  A large amount of fungus ball and debris were noted within the left sphenoid sinus.  The fungus balls and debris were evacuated with a combination of suction catheters and irrigation.  The sphenoid mucosa was noted to be mildly inflamed.  NasoPore packing was then placed at the entrance to the sphenoid sinus.  The care of the patient was turned over to the anesthesiologist. The patient was awakened from anesthesia without difficulty. The patient was extubated and transferred to the recovery room in good condition.   OPERATIVE FINDINGS: Left chronic fungal sphenoid sinusitis.  SPECIMEN: Left sphenoid sinus content.  FOLLOWUP CARE: The patient be discharged home once she is awake and alert.  The patient will follow up in my office in 1 week for sinus debridement.  Amedeo Detweiler Dois Moccasin, MD

## 2024-02-14 NOTE — H&P (Signed)
 CC: Chronic left sphenoid sinusitis, chronic headaches   HPI:  Carol Wells is a 89 y.o. female who presents today complaining of chronic left sphenoid sinusitis and chronic headaches.  She has been symptomatic for several years.  She previously underwent sinus surgery in [place]  in the 90s.  Her recent brain MRI scan showed left sphenoid sinusitis.  She was previously seen at [facility].  She was treated with multiple antibiotics, including amoxicillin and levofloxacin.  However, she continues to be symptomatic.  She complains of frequent headaches.  The patient cannot tolerate the use of steroid nasal spray secondary to her glaucoma.  The patient has a history of migraine headaches.  She is currently being followed by a neurologist.       Past Medical History:  Diagnosis Date   Anemia 11/2015   Arthritis     Blood transfusion 2017 JULY 22   Carotid stenosis      Dopplers 3/14: RICA 40-59%, LICA 0-39%   Cataract      REMOVED   Chronic headaches      HX MIGRAINES   Collagenous colitis     Diverticulosis 2013    Colonoscopy   Gastric ulcer 11/2015   Glaucoma      BOTH EYES   Hiatal hernia     History of spinal stenosis     History of uterine fibroid     Hx of cardiac catheterization      LHC 3/14: no angiographic CAD, EF 65%   Hx of echocardiogram      Echocardiogram 06/21/12: Mild LVH, EF 60-65%, grade 2 diastolic dysfunction, mild MR   Hyperlipidemia     Hypertension     Hypothyroidism     Internal hemorrhoids 2009    Colonoscopy   Migraines     UTI (urinary tract infection) HX OF               Past Surgical History:  Procedure Laterality Date   BREAST BIOPSY Right     BUNIONECTOMY   YRS AGO LEFT    RIGHT [facility] 3 YRS AGO   BUNIONECTOMY Left     CATARACT EXTRACTION W/ INTRAOCULAR LENS IMPLANT   03/2010    Left   CATARACT EXTRACTION W/ INTRAOCULAR LENS IMPLANT Bilateral 11/2010   COLONOSCOPY   2013    NORMAL    ESOPHAGOGASTRODUODENOSCOPY N/A 11/23/2015     Procedure: ESOPHAGOGASTRODUODENOSCOPY (EGD);  Surgeon: Claudis RAYMOND Rivet, MD;  [facility]: [facility];  Service: Endoscopy;  Laterality: N/A;   EUS N/A 12/19/2015    Procedure: ESOPHAGEAL ENDOSCOPIC ULTRASOUND (EUS) RADIAL;  Surgeon: Toribio SHAUNNA Cedar, MD;  Location: [facility];  Service: Endoscopy;  Laterality: N/A;   FUNCTIONAL ENDOSCOPIC SINUS SURGERY       HAMMER TOE SURGERY       HAND SURGERY Right 1998   INGUINAL HERNIA REPAIR Left 03/11/2016    Procedure: LEFT INGUINAL HERINA REPAIR WITH MESH;  Surgeon: Donnice Lima, MD;  Location: [facility] OR;  Service: General;  Laterality: Left;   INSERTION OF MESH Left 03/11/2016    Procedure: INSERTION OF MESH;  Surgeon: Donnice Lima, MD;  Location: [facility] OR;  Service: General;  Laterality: Left;   IR RADIOLOGIST EVAL & MGMT   07/01/2017   LAMINECTOMY   03/2008   LAPAROTOMY N/A 11/11/2017    Procedure: MINI LAPAROTOMY;  Surgeon: Eloy Herring, MD;  Location: [facility] ORS;  Service: Gynecology;  Laterality: N/A;   LUMBAR LAMINECTOMY/DECOMPRESSION MICRODISCECTOMY N/A 12/23/2020  Procedure: Laminectomy and Foraminotomy - Lumbar one-two;  Surgeon: Joshua Alm RAMAN, MD;  Location: [facility];  Service: Neurosurgery;  Laterality: N/A;   POLYPECTOMY       REVERSE SHOULDER ARTHROPLASTY Right 10/13/2018    Procedure: REVERSE SHOULDER ARTHROPLASTY;  Surgeon: Melita Drivers, MD;  Location: [facility];  Service: Orthopedics;  Laterality: Right;    ROBOTIC ASSISTED TOTAL HYSTERECTOMY WITH BILATERAL SALPINGO OOPHERECTOMY Bilateral 11/11/2017    Procedure: XI ROBOTIC ASSISTED TOTAL HYSTERECTOMY WITH BILATERAL SALPINGO OOPHORECTOMY FOR UTERUS GREATER THAN 250 GRAMS;  Surgeon: Eloy Herring, MD;  Location: [facility];  Service: Gynecology;  Laterality: Bilateral;   TONSILLECTOMY       TOTAL HIP ARTHROPLASTY   2008    Right               Family History  Problem Relation Age of Onset   Anuerysm Father 43   Colon cancer Brother 2   Migraines Grandchild            Social  History:  reports that she has never smoked. She has never used smokeless tobacco. She reports that she does not currently use alcohol. She reports that she does not use drugs.   Allergies:  Allergies  No Known Allergies            Prior to Admission medications   Medication Sig Start Date End Date Taking? Authorizing Provider  atorvastatin  (LIPITOR) 10 MG tablet TAKE 1 TABLET BY MOUTH EVERY DAY 12/06/23   Yes Gupta, Anjali L, MD  Cholecalciferol (VITAMIN D3 SUPER STRENGTH) 50 MCG (2000 UT) TABS Take 2,000 Units by mouth daily.     Yes [provider]  clobetasol ointment (TEMOVATE) 0.05 % Apply 1 application topically daily as needed (vaginitis).     Yes [provider]  estradiol  (ESTRACE ) 0.5 MG tablet TAKE ONE TABLET BY MOUTH AT BEDTIME 12/13/23   Yes Charlanne Fredia CROME, MD  hydrochlorothiazide  (HYDRODIURIL ) 25 MG tablet Take 1 tablet (25 mg total) by mouth daily. 09/15/23   Yes Charlanne Fredia CROME, MD  latanoprost  (XALATAN ) 0.005 % ophthalmic solution Place 1 drop into both eyes at bedtime.     Yes [provider]  levothyroxine  (SYNTHROID ) 125 MCG tablet TAKE 1 TABLET BY MOUTH EVERY DAY 07/21/23   Yes Charlanne Fredia CROME, MD  losartan  (COZAAR ) 100 MG tablet TAKE 1 TABLET BY MOUTH EVERY DAY 06/21/23   Yes Charlanne Fredia CROME, MD  Multiple Vitamin (MULTIVITAMIN) tablet Take 1 tablet by mouth daily with lunch.     Yes [provider]  traMADol  (ULTRAM ) 50 MG tablet Take 1 tablet (50 mg total) by mouth 2 (two) times daily. 09/08/23   Yes Charlanne Fredia CROME, MD  Turmeric (QC TUMERIC COMPLEX PO) Take 1,500 Units by mouth 2 (two) times daily.     Yes [provider]  vitamin C (ASCORBIC ACID ) 500 MG tablet Take 500 mg by mouth daily with lunch.     Yes [provider]      Blood pressure (!) 142/78, pulse 76, SpO2 94%. Exam: General: Communicates without difficulty, well nourished, no acute distress. Head: Normocephalic, no evidence injury, no tenderness, facial  buttresses intact without stepoff. Face/sinus: No tenderness to palpation and percussion. Facial movement is normal and symmetric. Eyes: PERRL, EOMI. No scleral icterus, conjunctivae clear. Neuro: CN II exam reveals vision grossly intact.  No nystagmus at any point of gaze. Ears: Auricles well formed without lesions.  Ear canals are intact without mass or  lesion.  No erythema or edema is appreciated.  The TMs are intact without fluid. Nose: External evaluation reveals normal support and skin without lesions.  Dorsum is intact.  Anterior rhinoscopy reveals congested mucosa over anterior aspect of inferior turbinates and intact septum.  No purulence noted. Oral:  Oral cavity and oropharynx are intact, symmetric, without erythema or edema.  Mucosa is moist without lesions. Neck: Full range of motion without pain.  There is no significant lymphadenopathy.  No masses palpable.  Thyroid  bed within normal limits to palpation.  Parotid glands and submandibular glands equal bilaterally without mass.  Trachea is midline. Neuro:  CN 2-12 grossly intact.    Procedure:  Flexible Nasal Endoscopy: Description: Risks, benefits, and alternatives of flexible endoscopy were explained to the patient.  Specific mention was made of the risk of throat numbness with difficulty swallowing, possible bleeding from the nose and mouth, and pain from the procedure.  The patient gave oral consent to proceed.  The flexible scope was inserted into the right nasal cavity.  Endoscopy of the interior nasal cavity, superior, inferior, and middle meatus was performed. The sphenoid-ethmoid recess was examined. Edematous mucosa was noted. Olfactory cleft was clear.  Nasopharynx was clear.  Turbinates were hypertrophied but without mass.  The procedure was repeated on the contralateral side with similar findings.  The patient tolerated the procedure well.    Assessment: 1.  Chronic left sphenoid sinusitis.  The patient has not responded to medical  treatments for the past 2 years.  She was treated with multiple antibiotics, including amoxicillin and levofloxacin.  She cannot tolerate the use of steroid nasal spray due to her glaucoma. 2.  Chronic headaches, likely secondary to her sphenoid sinusitis.   Plan: 1.  The physical exam and nasal endoscopy findings are reviewed with the patient. 2.  Nasal saline irrigation daily. 3.  Based on the above findings, the patient will benefit from surgical intervention with left sphenoidectomy and tissue removal.  The risk, benefits, alternatives, and details of the procedure are reviewed.  Questions are invited and answered. 4.  The patient would like to proceed with the procedure.

## 2024-02-14 NOTE — Anesthesia Postprocedure Evaluation (Signed)
 Anesthesia Post Note  Patient: Carol Wells  Procedure(s) Performed: ENDOSCOPIC LEFT SPHENOIDOTOMY WITH POLYP REMOVAL (Left: Nose) ENDOSCOPIC SINUS SURGERY USING COMPUTER-ASSISTED NAVIGATION (Left: Nose)     Patient location during evaluation: PACU Anesthesia Type: General Level of consciousness: awake and alert Pain management: pain level controlled Vital Signs Assessment: post-procedure vital signs reviewed and stable Respiratory status: spontaneous breathing, nonlabored ventilation, respiratory function stable and patient connected to nasal cannula oxygen Cardiovascular status: blood pressure returned to baseline and stable Postop Assessment: no apparent nausea or vomiting Anesthetic complications: no   No notable events documented.  Last Vitals:  Vitals:   02/14/24 0930 02/14/24 1000  BP: (!) 170/91 (!) 158/85  Pulse: 73 72  Resp: 14 16  Temp:  (!) 36.2 C  SpO2: 96% 93%    Last Pain:  Vitals:   02/14/24 1000  TempSrc:   PainSc: 5                  Rome Ade

## 2024-02-14 NOTE — Anesthesia Procedure Notes (Signed)
 Procedure Name: Intubation Date/Time: 02/14/2024 7:48 AM  Performed by: Franchot Izetta ORN, CRNAPre-anesthesia Checklist: Patient identified, Emergency Drugs available, Suction available and Patient being monitored Patient Re-evaluated:Patient Re-evaluated prior to induction Oxygen Delivery Method: Circle system utilized Preoxygenation: Pre-oxygenation with 100% oxygen Induction Type: IV induction Ventilation: Mask ventilation without difficulty Laryngoscope Size: Miller and 2 Grade View: Grade I Tube type: Oral Tube size: 6.5 mm Number of attempts: 1 Airway Equipment and Method: Stylet and Oral airway Placement Confirmation: ETT inserted through vocal cords under direct vision, positive ETCO2 and breath sounds checked- equal and bilateral Secured at: 22 cm Tube secured with: Tape Dental Injury: Teeth and Oropharynx as per pre-operative assessment

## 2024-02-15 ENCOUNTER — Other Ambulatory Visit: Payer: Self-pay | Admitting: Internal Medicine

## 2024-02-15 ENCOUNTER — Encounter (HOSPITAL_BASED_OUTPATIENT_CLINIC_OR_DEPARTMENT_OTHER): Payer: Self-pay | Admitting: Otolaryngology

## 2024-02-15 LAB — SURGICAL PATHOLOGY

## 2024-02-15 NOTE — Telephone Encounter (Signed)
 Patient has request for refill on 02/15/2024. Patient last refill was 01/14/2024. Patient has contract on file dated 09/15/2023. Medication pend and sent to PCP Zane, Fredia CROME, MD) for approval.

## 2024-02-17 DIAGNOSIS — Z23 Encounter for immunization: Secondary | ICD-10-CM | POA: Diagnosis not present

## 2024-02-21 ENCOUNTER — Ambulatory Visit (INDEPENDENT_AMBULATORY_CARE_PROVIDER_SITE_OTHER): Admitting: Otolaryngology

## 2024-02-21 ENCOUNTER — Encounter (INDEPENDENT_AMBULATORY_CARE_PROVIDER_SITE_OTHER): Payer: Self-pay | Admitting: Otolaryngology

## 2024-02-21 VITALS — BP 133/79 | HR 96 | Temp 97.5°F

## 2024-02-21 DIAGNOSIS — J338 Other polyp of sinus: Secondary | ICD-10-CM | POA: Diagnosis not present

## 2024-02-21 DIAGNOSIS — J323 Chronic sphenoidal sinusitis: Secondary | ICD-10-CM

## 2024-02-21 NOTE — Progress Notes (Addendum)
 Procedure: Nasal/sinus debridement status post endoscopic sinus surgery.  Indication: The patient previously underwent left endoscopic sinus surgery to treat her chronic left sphenoid sinusitis and fungus ball.  The patient returns today complaining of significant nasal congestion and crusting.  She had a moderate amount of bleeding from the nasal cavities.  Currently the patient denies any facial pain, fever, or visual change.  Anesthesia: Topical Xylocaine  and Neo-Synephrine.  Description: The patient is placed upright in the exam chair.  The left nasal cavity is sprayed with topical Xylocaine  and Neo-Synephrine.  A 0 rigid endoscope is used for the examination. The scope is first advanced past the left nostril into the left nasal cavity. A significant amount of crusting and blood clots are noted within the left nasal cavity and the sphenoid antrum.  The crusting and blood clots are removed with suction catheters and alligator forceps, which are inserted in parallel with the rigid endoscope.  After the debridement procedure, the left sphenoid antrum is noted to be widely patent. The patient tolerated the procedure well.  Follow up care: The patient is instructed to perform daily nasal saline irrigation.  The patient will return for re-evaluation in approximately 2 weeks.

## 2024-03-07 ENCOUNTER — Ambulatory Visit (INDEPENDENT_AMBULATORY_CARE_PROVIDER_SITE_OTHER): Admitting: Otolaryngology

## 2024-03-07 ENCOUNTER — Encounter (INDEPENDENT_AMBULATORY_CARE_PROVIDER_SITE_OTHER): Payer: Self-pay | Admitting: Otolaryngology

## 2024-03-07 VITALS — BP 137/72 | HR 74 | Temp 97.7°F

## 2024-03-07 DIAGNOSIS — J338 Other polyp of sinus: Secondary | ICD-10-CM | POA: Diagnosis not present

## 2024-03-07 DIAGNOSIS — J323 Chronic sphenoidal sinusitis: Secondary | ICD-10-CM | POA: Diagnosis not present

## 2024-03-07 NOTE — Progress Notes (Signed)
 Procedure: Left nasal/sinus debridement status post endoscopic sinus surgery.  Indication: The patient previously underwent left endoscopic sinus surgery to treat her chronic left sphenoid sinusitis and fungus ball.  The patient returns today reporting improvement in her nasal congestion and crusting.  She still has occasional headaches.  She denies any significant bleeding from the nasal cavities.  Currently the patient denies any facial pain, fever, or visual change.  Anesthesia: Topical Xylocaine  and Neo-Synephrine.  Description: The patient is placed upright in the exam chair.  The left nasal cavity is sprayed with topical Xylocaine  and Neo-Synephrine.  A 0 rigid endoscope is used for the examination. The scope is first advanced past the left nostril into the left nasal cavity. A moderate amount of crusting is noted within the left nasal cavity and the sphenoid antrum.  The crusting is removed with suction catheters and alligator forceps, which are inserted in parallel with the rigid endoscope.  After the debridement procedure, the left sphenoid antrum is noted to be widely patent. The patient tolerated the procedure well.  Follow up care: The patient is instructed to continue daily nasal saline irrigation.  The patient will return for re-evaluation in approximately 3 weeks.

## 2024-03-10 DIAGNOSIS — Z23 Encounter for immunization: Secondary | ICD-10-CM | POA: Diagnosis not present

## 2024-03-13 DIAGNOSIS — M25532 Pain in left wrist: Secondary | ICD-10-CM | POA: Diagnosis not present

## 2024-03-14 ENCOUNTER — Other Ambulatory Visit: Payer: Self-pay | Admitting: Internal Medicine

## 2024-03-14 NOTE — Telephone Encounter (Signed)
 Patient has request for refill on 03/14/24. Patient last refill was 02/15/24. Patient has contract on file dated 09/15/23. Patient has upcoming appointment 06/21/24.. Medication pend and sent to PCP Zane, Fredia CROME, MD) for approval.

## 2024-03-28 ENCOUNTER — Ambulatory Visit (INDEPENDENT_AMBULATORY_CARE_PROVIDER_SITE_OTHER): Admitting: Otolaryngology

## 2024-03-28 ENCOUNTER — Encounter (INDEPENDENT_AMBULATORY_CARE_PROVIDER_SITE_OTHER): Payer: Self-pay | Admitting: Otolaryngology

## 2024-03-28 VITALS — BP 145/83 | HR 78 | Temp 97.8°F | Ht 61.0 in | Wt 125.0 lb

## 2024-03-28 DIAGNOSIS — J323 Chronic sphenoidal sinusitis: Secondary | ICD-10-CM | POA: Diagnosis not present

## 2024-03-28 DIAGNOSIS — J338 Other polyp of sinus: Secondary | ICD-10-CM

## 2024-03-28 NOTE — Progress Notes (Signed)
 Procedure: Left nasal/sinus debridement status post endoscopic sinus surgery.  Indication: The patient previously underwent left endoscopic sinus surgery to treat her chronic left sphenoid sinusitis and fungus ball.  The patient returns today reporting continuing improvement in her nasal congestion and crusting.  She still has occasional headaches.  She denies any significant bleeding from the nasal cavities.  Currently the patient denies any facial pain, fever, or visual change.  Anesthesia: Topical Xylocaine  and Neo-Synephrine.  Description: The patient is placed upright in the exam chair.  The left nasal cavity is sprayed with topical Xylocaine  and Neo-Synephrine.  A 0 rigid endoscope is used for the examination. The scope is first advanced past the left nostril into the left nasal cavity. A small amount of crusting is noted within the left nasal cavity and the sphenoid antrum.  The crusting is removed with suction catheters and alligator forceps, which are inserted in parallel with the rigid endoscope.  After the debridement procedure, the left sphenoid antrum is noted to be widely patent. The patient tolerated the procedure well.  Follow up care: The patient is instructed to perform as needed nasal saline irrigation.  The patient will benefit from seeing a neurologist regarding her persistent headaches.  The patient will return for re-evaluation in approximately 6 months.

## 2024-04-03 ENCOUNTER — Telehealth: Payer: Self-pay | Admitting: Neurology

## 2024-04-03 NOTE — Telephone Encounter (Signed)
 Patient called to reschedule appointment

## 2024-04-13 ENCOUNTER — Other Ambulatory Visit: Payer: Self-pay | Admitting: Nurse Practitioner

## 2024-04-14 NOTE — Telephone Encounter (Signed)
 Patient is requesting a refill of the following medications: Requested Prescriptions   Pending Prescriptions Disp Refills   traMADol  (ULTRAM ) 50 MG tablet [Pharmacy Med Name: traMADol  HCl 50 MG Oral Tablet] 60 tablet 0    Sig: Take 1 tablet by mouth twice daily    Date of last refill: 03/14/2024  Refill amount: 60/0  Treatment agreement date: 09/14/2023

## 2024-05-16 ENCOUNTER — Other Ambulatory Visit: Payer: Self-pay | Admitting: Internal Medicine

## 2024-05-16 NOTE — Telephone Encounter (Signed)
 Patient is requesting a refill of the following medications: Requested Prescriptions   Pending Prescriptions Disp Refills   traMADol  (ULTRAM ) 50 MG tablet [Pharmacy Med Name: traMADol  HCl 50 MG Oral Tablet] 60 tablet 0    Sig: Take 1 tablet by mouth twice daily    Date of last refill: 04/15/2024  Refill amount: 60 tabs 0 refills  Treatment agreement date: 09/14/2023

## 2024-05-28 ENCOUNTER — Other Ambulatory Visit: Payer: Self-pay | Admitting: Internal Medicine

## 2024-05-28 DIAGNOSIS — E785 Hyperlipidemia, unspecified: Secondary | ICD-10-CM

## 2024-05-31 ENCOUNTER — Non-Acute Institutional Stay: Admitting: Internal Medicine

## 2024-05-31 ENCOUNTER — Encounter: Payer: Self-pay | Admitting: Internal Medicine

## 2024-05-31 VITALS — BP 118/66 | HR 88 | Temp 97.8°F | Resp 18 | Ht 61.0 in | Wt 122.9 lb

## 2024-05-31 DIAGNOSIS — M542 Cervicalgia: Secondary | ICD-10-CM | POA: Diagnosis not present

## 2024-05-31 DIAGNOSIS — I10 Essential (primary) hypertension: Secondary | ICD-10-CM | POA: Diagnosis not present

## 2024-05-31 DIAGNOSIS — E785 Hyperlipidemia, unspecified: Secondary | ICD-10-CM | POA: Diagnosis not present

## 2024-05-31 DIAGNOSIS — G4452 New daily persistent headache (NDPH): Secondary | ICD-10-CM | POA: Diagnosis not present

## 2024-05-31 DIAGNOSIS — E039 Hypothyroidism, unspecified: Secondary | ICD-10-CM | POA: Diagnosis not present

## 2024-05-31 NOTE — Progress Notes (Unsigned)
 "  Location:  [facility] Service:  Clinic (12)  Provider:   Code Status:  Goals of Care:     02/14/2024    6:33 AM  Advanced Directives  Does Patient Have a Medical Advance Directive? No  Would patient like information on creating a medical advance directive? No - Patient declined     Chief Complaint  Patient presents with   Medical Management of Chronic Issues    6 months with labs, needs medicare annual wellness visit and shingles vaccine.    HPI: Patient is a 89 y.o. female seen today for medical management of chronic diseases.    Lives in [place] in [facility]   Chronic headaches Patient has had multiple workups MRI showed chronic small vessel changes, chronic lacunar infarcts, mild to moderate cerebral atrophy with cervical spinal stenosis She had occipital nerve block by Dr. Bonner She also tried Lexapro , Topamax  with no help with the headaches Now patient takes tramadol .  She states that keeps her functional Headaches happen every day when she gets up in the morning.  Sometimes she has it in the afternoon  Sleep apnea When she was seen by neurology Dr. Buck for headaches the sleep study was done and it showed mild sleep apnea.  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 Karis She says it did not help her headache but it did help her with her breathing.  She saw Dr. Hayes the OB/GYN for her pessary change.  Right hip pain Patient is s/p right hip arthroplasty and was seen by Dr. Ernie who is planning to do MRI to see the state of her hip joint  Patient also has chronic arthritis in her hands and had wrist injected at [facility].   Patient also has a history of ,hypothyroidism, atrophic vaginitis, hyperlipidemia  Patient also has a history of gastric ulcer, gastritis and  collagenous colitis. Had tapered herself off Protonix  and budesonide  S/p Lumbar Laminectomy in 12/2020     Past Medical History:  Diagnosis Date   Anemia 11/2015   Arthritis    Blood transfusion 2017 JULY 22   Carotid stenosis    Dopplers 3/14: RICA 40-59%, LICA 0-39%   Cataract    REMOVED   Chronic headaches    HX MIGRAINES   Collagenous colitis    Diverticulosis 2013   Colonoscopy   Gastric ulcer 11/2015   Glaucoma    BOTH EYES   Hiatal hernia    History of spinal stenosis    History of uterine fibroid    Hx of cardiac catheterization    LHC 3/14: no angiographic CAD, EF 65%   Hx of echocardiogram    Echocardiogram 06/21/12: Mild LVH, EF 60-65%, grade 2 diastolic dysfunction, mild MR   Hyperlipidemia    Hypertension    Hypothyroidism    Internal hemorrhoids 2009   Colonoscopy   Migraines    UTI (urinary tract infection) HX OF    Past Surgical History:  Procedure Laterality Date   BREAST BIOPSY Right    BUNIONECTOMY  YRS AGO LEFT   RIGHT [facility] 3 YRS AGO   BUNIONECTOMY Left    CATARACT EXTRACTION W/ INTRAOCULAR LENS IMPLANT  03/2010   Left   CATARACT EXTRACTION W/ INTRAOCULAR LENS IMPLANT Bilateral 11/2010   COLONOSCOPY  2013  NORMAL    ESOPHAGOGASTRODUODENOSCOPY N/A 11/23/2015   Procedure: ESOPHAGOGASTRODUODENOSCOPY (EGD);  Surgeon: Claudis RAYMOND Rivet, MD;  [facility];  Service: Endoscopy;  Laterality: N/A;   EUS N/A 12/19/2015   Procedure: ESOPHAGEAL ENDOSCOPIC ULTRASOUND (EUS) RADIAL;  Surgeon: Toribio SHAUNNA Cedar, MD;  Location: [facility];  Service: Endoscopy;  Laterality: N/A;   FUNCTIONAL ENDOSCOPIC SINUS SURGERY     HAMMER TOE SURGERY     HAND SURGERY Right 1998   INGUINAL HERNIA REPAIR Left 03/11/2016   Procedure: LEFT INGUINAL HERINA REPAIR WITH MESH;  Surgeon: Donnice Lima, MD;  Location: [facility];  Service: General;  Laterality: Left;   INSERTION OF MESH Left 03/11/2016   Procedure: INSERTION OF MESH;  Surgeon: Donnice Lima, MD;   Location: [facility];  Service: General;  Laterality: Left;   IR RADIOLOGIST EVAL & MGMT  07/01/2017   LAMINECTOMY  03/2008   LAPAROTOMY N/A 11/11/2017   Procedure: MINI LAPAROTOMY;  Surgeon: Eloy Herring, MD;  Location: [facility];  Service: Gynecology;  Laterality: N/A;   LUMBAR LAMINECTOMY/DECOMPRESSION MICRODISCECTOMY N/A 12/23/2020   Procedure: Laminectomy and Foraminotomy - Lumbar one-two;  Surgeon: Joshua Alm RAMAN, MD;  [facility];  Service: Neurosurgery;  Laterality: N/A;   POLYPECTOMY     REVERSE SHOULDER ARTHROPLASTY Right 10/13/2018   Procedure: REVERSE SHOULDER ARTHROPLASTY;  Surgeon: Melita Drivers, MD;  Location: [facility];  Service: Orthopedics;  Laterality: Right;    ROBOTIC ASSISTED TOTAL HYSTERECTOMY WITH BILATERAL SALPINGO OOPHERECTOMY Bilateral 11/11/2017   Procedure: XI ROBOTIC ASSISTED TOTAL HYSTERECTOMY WITH BILATERAL SALPINGO OOPHORECTOMY FOR UTERUS GREATER THAN 250 GRAMS;  Surgeon: Eloy Herring, MD;  Location: [facility];  Service: Gynecology;  Laterality: Bilateral;   SINUS ENDO W/FUSION Left 02/14/2024   Procedure: ENDOSCOPIC SINUS SURGERY USING COMPUTER-ASSISTED NAVIGATION;  Surgeon: Karis Clunes, MD;  Location: [facility];  Service: ENT;  Laterality: Left;   SPHENOIDECTOMY Left 02/14/2024   Procedure: ENDOSCOPIC LEFT SPHENOIDOTOMY WITH POLYP REMOVAL;  Surgeon: Karis Clunes, MD;  Location: [facility];  Service: ENT;  Laterality: Left;   TONSILLECTOMY     TOTAL HIP ARTHROPLASTY  2008   Right    Allergies[1]  Outpatient Encounter Medications as of 05/31/2024  Medication Sig   atorvastatin  (LIPITOR) 10 MG tablet TAKE 1 TABLET BY MOUTH EVERY DAY   Cholecalciferol (VITAMIN D3 SUPER STRENGTH) 50 MCG (2000 UT) TABS Take 2,000 Units by mouth daily.   clobetasol ointment (TEMOVATE) 0.05 % Apply 1 application topically daily as needed (vaginitis).   estradiol  (ESTRACE ) 0.5 MG tablet TAKE ONE TABLET BY MOUTH AT BEDTIME   hydrochlorothiazide  (HYDRODIURIL ) 25  MG tablet Take 1 tablet (25 mg total) by mouth daily.   latanoprost  (XALATAN ) 0.005 % ophthalmic solution Place 1 drop into both eyes at bedtime.   levothyroxine  (SYNTHROID ) 125 MCG tablet TAKE 1 TABLET BY MOUTH EVERY DAY   losartan  (COZAAR ) 100 MG tablet TAKE 1 TABLET BY MOUTH EVERY DAY   Multiple Vitamin (MULTIVITAMIN) tablet Take 1 tablet by mouth daily with lunch.   traMADol  (ULTRAM ) 50 MG tablet Take 1 tablet by mouth twice daily   Turmeric (QC TUMERIC COMPLEX PO) Take 1,500 Units by mouth 2 (two) times daily.   vitamin C (ASCORBIC ACID ) 500 MG tablet Take 500 mg by mouth daily with lunch. (Patient not taking: Reported on 05/31/2024)   No facility-administered encounter medications on file as of 05/31/2024.    Review of Systems:  Review of Systems  Constitutional:  Negative for activity change  and appetite change.  HENT: Negative.    Respiratory:  Negative for cough and shortness of breath.   Cardiovascular:  Negative for leg swelling.  Gastrointestinal:  Negative for constipation.  Genitourinary: Negative.   Musculoskeletal:  Positive for arthralgias and myalgias. Negative for gait problem.  Skin: Negative.   Neurological:  Negative for dizziness and weakness.  Psychiatric/Behavioral:  Negative for confusion, dysphoric mood and sleep disturbance.     Health Maintenance  Topic Date Due   Zoster Vaccines- Shingrix (1 of 2) 08/22/1950   Medicare Annual Wellness (AWV)  08/03/2023   COVID-19 Vaccine (6 - Pfizer risk 2025-26 season) 09/07/2024   DTaP/Tdap/Td (2 - Td or Tdap) 06/13/2030   Pneumococcal Vaccine: 50+ Years  Completed   Influenza Vaccine  Completed   Bone Density Scan  Completed   Meningococcal B Vaccine  Aged Out    Physical Exam: Vitals:   05/31/24 1310  BP: 118/66  Pulse: 88  Resp: 18  Temp: 97.8 F (36.6 C)  SpO2: 95%  Weight: 122 lb 14.4 oz (55.7 kg)  Height: 5' 1 (1.549 m)   Body mass index is 23.22 kg/m. Physical Exam Vitals reviewed.   Constitutional:      Appearance: Normal appearance.  HENT:     Head: Normocephalic.     Nose: Nose normal.     Mouth/Throat:     Mouth: Mucous membranes are moist.     Pharynx: Oropharynx is clear.  Eyes:     Pupils: Pupils are equal, round, and reactive to light.  Cardiovascular:     Rate and Rhythm: Normal rate and regular rhythm.     Pulses: Normal pulses.     Heart sounds: Normal heart sounds. No murmur heard. Pulmonary:     Effort: Pulmonary effort is normal.     Breath sounds: Normal breath sounds.  Abdominal:     General: Abdomen is flat. Bowel sounds are normal.     Palpations: Abdomen is soft.  Musculoskeletal:        General: No swelling.     Cervical back: Neck supple.     Comments: Mild Swelling  Skin:    General: Skin is warm.  Neurological:     General: No focal deficit present.     Mental Status: She is alert and oriented to person, place, and time.  Psychiatric:        Mood and Affect: Mood normal.        Thought Content: Thought content normal.     Labs reviewed: Basic Metabolic Panel: Recent Labs    08/19/23 0832 09/06/23 0800 12/13/23 0810 02/08/24 1518  NA 135 133* 134* 131*  K 4.3 4.2 4.1 4.4  CL 98 95* 95* 95*  CO2 32 33* 32 28  GLUCOSE 84 74 81 101*  BUN 15 20 16 16   CREATININE 0.61 0.64 0.66 0.68  CALCIUM  9.0 9.2 9.4 9.0  TSH 2.45  --   --   --    Liver Function Tests: Recent Labs    08/19/23 0832 12/13/23 0810  AST 33 32  ALT 21 21  BILITOT 0.7 0.8  PROT 7.1 7.3   No results for input(s): LIPASE, AMYLASE in the last 8760 hours. No results for input(s): AMMONIA in the last 8760 hours. CBC: Recent Labs    08/19/23 0832 09/06/23 0800 12/13/23 0810  WBC 4.9 4.2 4.0  NEUTROABS 2,842 2,276 2,148  HGB 12.2 11.8 12.6  HCT 37.6 35.7 38.6  MCV 91.9 89.7 89.8  PLT CANCELED 105* 94*   Lipid Panel: Recent Labs    08/19/23 0832  CHOL 148  HDL 65  LDLCALC 68  TRIG 72  CHOLHDL 2.3   No results found for:  HGBA1C  Procedures since last visit: No results found.  Assessment/Plan 1. Primary hypertension (Primary) Hydrochlorothiazide  and Losartan  Follows with cardiology also.  2. Headache, new daily persistent (ND[facility]) Tramadol  Works. Still think it is related to her Neck Stenosis Will continue for now patient Did discuss about headache clinic but will wait   3. Hypothyroidism, unspecified type Repeat TSH  4. HLD On low dose of statin  5. Neck pain Severe stenosis with spinal cord compression, nerve block provided no relief.  Seen Dr Bonner Not surgical candidate has failed Injections Plan Conservative management Tramadol  Works 6 Right Hip pain Waiting for MRI results from Dr Ernie 7 Mild Sleep Apnea Not using her CPAP since her Sinus Surgery 8 Chronic sphenoidal sinusitis  Following with Dr. Karis  Mild hyponatremia Repeat the BMP.   Labs/tests ordered:  * No order type specified * Next appt:  07/13/2024        [1]  Allergies Allergen Reactions   Nsaids Other (See Comments)    Hx of bleeding ulcers   "

## 2024-06-06 LAB — COMPLETE METABOLIC PANEL WITHOUT GFR
AG Ratio: 1.5 (calc) (ref 1.0–2.5)
ALT: 17 U/L (ref 6–29)
AST: 28 U/L (ref 10–35)
Albumin: 4.1 g/dL (ref 3.6–5.1)
Alkaline phosphatase (APISO): 49 U/L (ref 37–153)
BUN: 19 mg/dL (ref 7–25)
CO2: 33 mmol/L — ABNORMAL HIGH (ref 20–32)
Calcium: 9.1 mg/dL (ref 8.6–10.4)
Chloride: 96 mmol/L — ABNORMAL LOW (ref 98–110)
Creat: 0.65 mg/dL (ref 0.60–0.95)
Globulin: 2.8 g/dL (ref 1.9–3.7)
Glucose, Bld: 85 mg/dL (ref 65–99)
Potassium: 4.2 mmol/L (ref 3.5–5.3)
Sodium: 134 mmol/L — ABNORMAL LOW (ref 135–146)
Total Bilirubin: 0.7 mg/dL (ref 0.2–1.2)
Total Protein: 6.9 g/dL (ref 6.1–8.1)

## 2024-06-06 LAB — CBC WITH DIFFERENTIAL/PLATELET
Absolute Lymphocytes: 1336 {cells}/uL (ref 850–3900)
Absolute Monocytes: 419 {cells}/uL (ref 200–950)
Basophils Absolute: 11 {cells}/uL (ref 0–200)
Basophils Relative: 0.2 %
Eosinophils Absolute: 159 {cells}/uL (ref 15–500)
Eosinophils Relative: 3 %
HCT: 35.2 % — ABNORMAL LOW (ref 35.9–46.0)
Hemoglobin: 11.8 g/dL (ref 11.7–15.5)
MCH: 30.1 pg (ref 27.0–33.0)
MCHC: 33.5 g/dL (ref 31.6–35.4)
MCV: 89.8 fL (ref 81.4–101.7)
MPV: 11 fL (ref 7.5–12.5)
Monocytes Relative: 7.9 %
Neutro Abs: 3376 {cells}/uL (ref 1500–7800)
Neutrophils Relative %: 63.7 %
Platelets: 91 10*3/uL — ABNORMAL LOW (ref 140–400)
RBC: 3.92 Million/uL (ref 3.80–5.10)
RDW: 12.3 % (ref 11.0–15.0)
Total Lymphocyte: 25.2 %
WBC: 5.3 10*3/uL (ref 3.8–10.8)

## 2024-06-06 LAB — LIPID PANEL
Cholesterol: 147 mg/dL
HDL: 71 mg/dL
LDL Cholesterol (Calc): 62 mg/dL
Non-HDL Cholesterol (Calc): 76 mg/dL
Total CHOL/HDL Ratio: 2.1 (calc)
Triglycerides: 56 mg/dL

## 2024-06-06 LAB — TSH: TSH: 5.17 m[IU]/L — ABNORMAL HIGH (ref 0.40–4.50)

## 2024-06-09 ENCOUNTER — Telehealth: Payer: Self-pay

## 2024-06-09 NOTE — Telephone Encounter (Signed)
 Copied from CRM #[id number]. Topic: Clinical - Medication Question >> Jun 09, 2024 12:41 PM DeAngela L wrote: Reason for CRM: patient states her TSH was high the other day on her lab results and she would like to ask Dr Charlanne if she wanted to change the patient medication since the TSH was high  Patient num  [phone]

## 2024-06-16 ENCOUNTER — Ambulatory Visit: Admitting: Emergency Medicine

## 2024-06-21 ENCOUNTER — Encounter: Payer: Self-pay | Admitting: Internal Medicine

## 2024-07-13 ENCOUNTER — Encounter: Admitting: Nurse Practitioner

## 2024-07-19 ENCOUNTER — Ambulatory Visit: Admitting: Neurology

## 2024-08-02 ENCOUNTER — Ambulatory Visit: Admitting: Neurology

## 2024-09-19 ENCOUNTER — Ambulatory Visit (INDEPENDENT_AMBULATORY_CARE_PROVIDER_SITE_OTHER): Admitting: Otolaryngology

## 2024-11-29 ENCOUNTER — Encounter: Admitting: Internal Medicine
